# Patient Record
Sex: Male | Born: 1950
Health system: Southern US, Community
[De-identification: ages and names within clinical notes are randomized; demographics above are authoritative.]

## PROBLEM LIST (undated history)

## (undated) DIAGNOSIS — G589 Mononeuropathy, unspecified: Secondary | ICD-10-CM

## (undated) DIAGNOSIS — I288 Other diseases of pulmonary vessels: Secondary | ICD-10-CM

## (undated) DIAGNOSIS — M47816 Spondylosis without myelopathy or radiculopathy, lumbar region: Secondary | ICD-10-CM

## (undated) DIAGNOSIS — Q268 Other congenital malformations of great veins: Secondary | ICD-10-CM

## (undated) DIAGNOSIS — K644 Residual hemorrhoidal skin tags: Secondary | ICD-10-CM

## (undated) DIAGNOSIS — K5790 Diverticulosis of intestine, part unspecified, without perforation or abscess without bleeding: Secondary | ICD-10-CM

## (undated) DIAGNOSIS — H8110 Benign paroxysmal vertigo, unspecified ear: Secondary | ICD-10-CM

## (undated) DIAGNOSIS — N400 Enlarged prostate without lower urinary tract symptoms: Secondary | ICD-10-CM

## (undated) DIAGNOSIS — I480 Paroxysmal atrial fibrillation: Secondary | ICD-10-CM

## (undated) DIAGNOSIS — K4090 Unilateral inguinal hernia, without obstruction or gangrene, not specified as recurrent: Secondary | ICD-10-CM

## (undated) DIAGNOSIS — M19049 Primary osteoarthritis, unspecified hand: Secondary | ICD-10-CM

## (undated) DIAGNOSIS — F0781 Postconcussional syndrome: Secondary | ICD-10-CM

## (undated) DIAGNOSIS — E875 Hyperkalemia: Secondary | ICD-10-CM

## (undated) DIAGNOSIS — R42 Dizziness and giddiness: Secondary | ICD-10-CM

## (undated) DIAGNOSIS — R001 Bradycardia, unspecified: Secondary | ICD-10-CM

## (undated) DIAGNOSIS — S76019A Strain of muscle, fascia and tendon of unspecified hip, initial encounter: Secondary | ICD-10-CM

## (undated) DIAGNOSIS — J984 Other disorders of lung: Secondary | ICD-10-CM

## (undated) HISTORY — DX: Other diseases of pulmonary vessels: I28.8

## (undated) HISTORY — DX: Dizziness and giddiness: R42

## (undated) HISTORY — PX: ANKLE ARTHROSCOPY: SHX545

## (undated) HISTORY — DX: Unilateral inguinal hernia, without obstruction or gangrene, not specified as recurrent: K40.90

## (undated) HISTORY — DX: Residual hemorrhoidal skin tags: K64.4

## (undated) HISTORY — DX: Other congenital malformations of great veins: Q26.8

## (undated) HISTORY — DX: Hyperkalemia: E87.5

## (undated) HISTORY — DX: Bradycardia, unspecified: R00.1

## (undated) HISTORY — DX: Postconcussional syndrome: F07.81

## (undated) HISTORY — DX: Mononeuropathy, unspecified: G58.9

## (undated) HISTORY — PX: KNEE ARTHROSCOPY: SHX127

## (undated) HISTORY — DX: Primary osteoarthritis, unspecified hand: M19.049

## (undated) HISTORY — DX: Benign prostatic hyperplasia without lower urinary tract symptoms: N40.0

## (undated) HISTORY — DX: Diverticulosis of intestine, part unspecified, without perforation or abscess without bleeding: K57.90

## (undated) HISTORY — DX: Other disorders of lung: J98.4

## (undated) HISTORY — DX: Strain of muscle, fascia and tendon of unspecified hip, initial encounter: S76.019A

## (undated) HISTORY — DX: Benign paroxysmal vertigo, unspecified ear: H81.10

## (undated) HISTORY — DX: Spondylosis without myelopathy or radiculopathy, lumbar region: M47.816

---

## 1999-02-08 ENCOUNTER — Emergency Department (HOSPITAL_COMMUNITY): Admission: EM | Admit: 1999-02-08 | Discharge: 1999-02-08 | Payer: Self-pay | Admitting: Emergency Medicine

## 2001-02-22 ENCOUNTER — Encounter: Admission: RE | Admit: 2001-02-22 | Discharge: 2001-02-22 | Payer: Self-pay | Admitting: Sports Medicine

## 2001-03-12 ENCOUNTER — Encounter: Admission: RE | Admit: 2001-03-12 | Discharge: 2001-03-12 | Payer: Self-pay | Admitting: Sports Medicine

## 2001-04-16 ENCOUNTER — Encounter: Admission: RE | Admit: 2001-04-16 | Discharge: 2001-04-16 | Payer: Self-pay | Admitting: Family Medicine

## 2001-04-28 ENCOUNTER — Other Ambulatory Visit: Admission: RE | Admit: 2001-04-28 | Discharge: 2001-04-28 | Payer: Self-pay | Admitting: Sports Medicine

## 2001-04-28 ENCOUNTER — Encounter: Admission: RE | Admit: 2001-04-28 | Discharge: 2001-04-28 | Payer: Self-pay | Admitting: Family Medicine

## 2001-05-07 ENCOUNTER — Encounter: Admission: RE | Admit: 2001-05-07 | Discharge: 2001-05-07 | Payer: Self-pay | Admitting: Family Medicine

## 2001-06-01 ENCOUNTER — Encounter: Admission: RE | Admit: 2001-06-01 | Discharge: 2001-06-01 | Payer: Self-pay | Admitting: Sports Medicine

## 2001-07-09 ENCOUNTER — Encounter: Admission: RE | Admit: 2001-07-09 | Discharge: 2001-07-09 | Payer: Self-pay | Admitting: Sports Medicine

## 2002-01-13 ENCOUNTER — Encounter: Admission: RE | Admit: 2002-01-13 | Discharge: 2002-01-13 | Payer: Self-pay | Admitting: Sports Medicine

## 2002-02-08 ENCOUNTER — Encounter: Admission: RE | Admit: 2002-02-08 | Discharge: 2002-02-08 | Payer: Self-pay | Admitting: Sports Medicine

## 2002-09-27 ENCOUNTER — Encounter: Admission: RE | Admit: 2002-09-27 | Discharge: 2002-09-27 | Payer: Self-pay | Admitting: Sports Medicine

## 2002-10-13 ENCOUNTER — Encounter: Payer: Self-pay | Admitting: Sports Medicine

## 2002-10-13 ENCOUNTER — Encounter: Admission: RE | Admit: 2002-10-13 | Discharge: 2002-10-13 | Payer: Self-pay | Admitting: Sports Medicine

## 2002-11-15 ENCOUNTER — Encounter: Admission: RE | Admit: 2002-11-15 | Discharge: 2002-11-15 | Payer: Self-pay | Admitting: Family Medicine

## 2002-11-22 ENCOUNTER — Encounter: Payer: Self-pay | Admitting: Sports Medicine

## 2002-11-22 ENCOUNTER — Encounter: Admission: RE | Admit: 2002-11-22 | Discharge: 2002-11-22 | Payer: Self-pay | Admitting: Sports Medicine

## 2002-11-29 ENCOUNTER — Encounter: Admission: RE | Admit: 2002-11-29 | Discharge: 2002-11-29 | Payer: Self-pay | Admitting: Family Medicine

## 2002-12-23 ENCOUNTER — Encounter: Admission: RE | Admit: 2002-12-23 | Discharge: 2002-12-23 | Payer: Self-pay | Admitting: Sports Medicine

## 2002-12-23 ENCOUNTER — Encounter: Payer: Self-pay | Admitting: Sports Medicine

## 2002-12-28 ENCOUNTER — Encounter: Admission: RE | Admit: 2002-12-28 | Discharge: 2002-12-28 | Payer: Self-pay | Admitting: Family Medicine

## 2003-05-30 ENCOUNTER — Encounter: Payer: Self-pay | Admitting: Sports Medicine

## 2003-05-30 ENCOUNTER — Encounter: Admission: RE | Admit: 2003-05-30 | Discharge: 2003-05-30 | Payer: Self-pay | Admitting: Sports Medicine

## 2003-06-12 ENCOUNTER — Encounter: Payer: Self-pay | Admitting: Sports Medicine

## 2003-06-12 ENCOUNTER — Encounter: Admission: RE | Admit: 2003-06-12 | Discharge: 2003-06-12 | Payer: Self-pay | Admitting: Sports Medicine

## 2003-12-26 ENCOUNTER — Encounter: Admission: RE | Admit: 2003-12-26 | Discharge: 2003-12-26 | Payer: Self-pay | Admitting: Family Medicine

## 2004-01-02 ENCOUNTER — Encounter: Admission: RE | Admit: 2004-01-02 | Discharge: 2004-01-02 | Payer: Self-pay | Admitting: Sports Medicine

## 2004-06-11 ENCOUNTER — Encounter: Admission: RE | Admit: 2004-06-11 | Discharge: 2004-06-11 | Payer: Self-pay | Admitting: Family Medicine

## 2004-06-11 ENCOUNTER — Encounter: Admission: RE | Admit: 2004-06-11 | Discharge: 2004-06-11 | Payer: Self-pay | Admitting: Sports Medicine

## 2004-10-31 ENCOUNTER — Ambulatory Visit: Payer: Self-pay | Admitting: Sports Medicine

## 2004-11-29 ENCOUNTER — Ambulatory Visit: Payer: Self-pay | Admitting: Sports Medicine

## 2004-12-16 ENCOUNTER — Emergency Department (HOSPITAL_COMMUNITY): Admission: EM | Admit: 2004-12-16 | Discharge: 2004-12-16 | Payer: Self-pay | Admitting: *Deleted

## 2005-08-26 ENCOUNTER — Ambulatory Visit: Payer: Self-pay | Admitting: Sports Medicine

## 2005-10-20 ENCOUNTER — Emergency Department (HOSPITAL_COMMUNITY): Admission: EM | Admit: 2005-10-20 | Discharge: 2005-10-20 | Payer: Self-pay | Admitting: *Deleted

## 2005-10-28 ENCOUNTER — Ambulatory Visit: Payer: Self-pay | Admitting: Sports Medicine

## 2005-10-28 ENCOUNTER — Encounter: Admission: RE | Admit: 2005-10-28 | Discharge: 2005-10-28 | Payer: Self-pay | Admitting: Sports Medicine

## 2005-11-02 ENCOUNTER — Encounter: Admission: RE | Admit: 2005-11-02 | Discharge: 2005-11-02 | Payer: Self-pay | Admitting: Sports Medicine

## 2006-01-06 ENCOUNTER — Ambulatory Visit: Payer: Self-pay | Admitting: Sports Medicine

## 2006-01-22 ENCOUNTER — Ambulatory Visit: Payer: Self-pay | Admitting: Gastroenterology

## 2006-01-22 ENCOUNTER — Ambulatory Visit: Payer: Self-pay | Admitting: Cardiology

## 2006-01-29 ENCOUNTER — Ambulatory Visit: Payer: Self-pay | Admitting: Gastroenterology

## 2006-02-02 ENCOUNTER — Ambulatory Visit: Payer: Self-pay | Admitting: Gastroenterology

## 2006-10-16 ENCOUNTER — Ambulatory Visit: Payer: Self-pay | Admitting: Sports Medicine

## 2006-12-21 ENCOUNTER — Ambulatory Visit: Payer: Self-pay | Admitting: Family Medicine

## 2006-12-21 ENCOUNTER — Encounter (INDEPENDENT_AMBULATORY_CARE_PROVIDER_SITE_OTHER): Payer: Self-pay | Admitting: *Deleted

## 2006-12-21 LAB — CONVERTED CEMR LAB
AST: 23 units/L (ref 0–37)
Albumin: 4.5 g/dL (ref 3.5–5.2)
Alkaline Phosphatase: 58 units/L (ref 39–117)
Potassium: 4.5 meq/L (ref 3.5–5.3)
Sodium: 142 meq/L (ref 135–145)
Total Protein: 7.3 g/dL (ref 6.0–8.3)

## 2006-12-31 ENCOUNTER — Ambulatory Visit: Payer: Self-pay | Admitting: Sports Medicine

## 2006-12-31 DIAGNOSIS — R1011 Right upper quadrant pain: Secondary | ICD-10-CM | POA: Insufficient documentation

## 2006-12-31 DIAGNOSIS — S79919A Unspecified injury of unspecified hip, initial encounter: Secondary | ICD-10-CM | POA: Insufficient documentation

## 2006-12-31 DIAGNOSIS — S79929A Unspecified injury of unspecified thigh, initial encounter: Secondary | ICD-10-CM

## 2006-12-31 LAB — CONVERTED CEMR LAB
LDL Cholesterol: 92 mg/dL (ref 0–99)
VLDL: 8 mg/dL (ref 0–40)

## 2007-11-09 ENCOUNTER — Encounter: Admission: RE | Admit: 2007-11-09 | Discharge: 2007-11-09 | Payer: Self-pay | Admitting: Sports Medicine

## 2007-11-09 ENCOUNTER — Ambulatory Visit: Payer: Self-pay | Admitting: Sports Medicine

## 2007-11-09 DIAGNOSIS — M25559 Pain in unspecified hip: Secondary | ICD-10-CM | POA: Insufficient documentation

## 2007-12-13 ENCOUNTER — Encounter: Payer: Self-pay | Admitting: Sports Medicine

## 2007-12-17 ENCOUNTER — Encounter: Payer: Self-pay | Admitting: Sports Medicine

## 2008-01-07 ENCOUNTER — Encounter: Payer: Self-pay | Admitting: Sports Medicine

## 2008-01-26 ENCOUNTER — Encounter: Payer: Self-pay | Admitting: Sports Medicine

## 2008-03-01 ENCOUNTER — Ambulatory Visit: Payer: Self-pay | Admitting: Sports Medicine

## 2008-03-01 DIAGNOSIS — G589 Mononeuropathy, unspecified: Secondary | ICD-10-CM | POA: Insufficient documentation

## 2008-03-01 HISTORY — DX: Mononeuropathy, unspecified: G58.9

## 2008-03-21 ENCOUNTER — Telehealth: Payer: Self-pay | Admitting: Sports Medicine

## 2008-08-04 ENCOUNTER — Ambulatory Visit: Payer: Self-pay | Admitting: Sports Medicine

## 2008-08-04 DIAGNOSIS — R1031 Right lower quadrant pain: Secondary | ICD-10-CM | POA: Insufficient documentation

## 2008-08-04 DIAGNOSIS — M25519 Pain in unspecified shoulder: Secondary | ICD-10-CM | POA: Insufficient documentation

## 2008-12-18 ENCOUNTER — Encounter: Payer: Self-pay | Admitting: Sports Medicine

## 2009-10-16 ENCOUNTER — Ambulatory Visit: Payer: Self-pay | Admitting: Sports Medicine

## 2009-10-16 DIAGNOSIS — M1990 Unspecified osteoarthritis, unspecified site: Secondary | ICD-10-CM | POA: Insufficient documentation

## 2009-10-16 DIAGNOSIS — M19049 Primary osteoarthritis, unspecified hand: Secondary | ICD-10-CM

## 2009-10-16 HISTORY — DX: Primary osteoarthritis, unspecified hand: M19.049

## 2011-05-11 ENCOUNTER — Emergency Department (HOSPITAL_COMMUNITY)
Admission: EM | Admit: 2011-05-11 | Discharge: 2011-05-11 | Disposition: A | Discharge status: Home / Self Care | Payer: BC Managed Care – PPO | Attending: Emergency Medicine | Admitting: Emergency Medicine

## 2011-05-11 ENCOUNTER — Emergency Department (HOSPITAL_COMMUNITY): Payer: BC Managed Care – PPO

## 2011-05-11 DIAGNOSIS — R51 Headache: Secondary | ICD-10-CM | POA: Insufficient documentation

## 2011-05-11 DIAGNOSIS — S0990XA Unspecified injury of head, initial encounter: Secondary | ICD-10-CM | POA: Insufficient documentation

## 2011-05-11 DIAGNOSIS — IMO0002 Reserved for concepts with insufficient information to code with codable children: Secondary | ICD-10-CM | POA: Insufficient documentation

## 2011-05-11 DIAGNOSIS — R404 Transient alteration of awareness: Secondary | ICD-10-CM | POA: Insufficient documentation

## 2011-05-11 DIAGNOSIS — R110 Nausea: Secondary | ICD-10-CM | POA: Insufficient documentation

## 2011-05-13 ENCOUNTER — Telehealth: Payer: Self-pay | Admitting: *Deleted
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
## 2011-05-15 ENCOUNTER — Ambulatory Visit (INDEPENDENT_AMBULATORY_CARE_PROVIDER_SITE_OTHER): Payer: BC Managed Care – PPO | Admitting: Sports Medicine

## 2011-05-15 DIAGNOSIS — F0781 Postconcussional syndrome: Secondary | ICD-10-CM

## 2011-05-15 DIAGNOSIS — S060X9A Concussion with loss of consciousness of unspecified duration, initial encounter: Secondary | ICD-10-CM

## 2011-05-15 DIAGNOSIS — S060XAA Concussion with loss of consciousness status unknown, initial encounter: Secondary | ICD-10-CM

## 2011-05-15 HISTORY — DX: Postconcussional syndrome: F07.81
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
## 2011-05-15 NOTE — Patient Instructions (Addendum)
Do not work for the rest of the week Ok to use tylenol or aleve for headache Try meclizine for vertigo      Concussion and Brain Injury A blow or jolt to the head can disrupt the normal function of the brain. Doctors often call this type of brain injury a "concussion" or a "closed head injury." Concussions are usually not life threatening. Even so, the effects of a concussion can be serious.   CAUSES A concussion is caused by a blunt blow to the head. The blow might be direct or indirect as described below.  Direct blow (running into another player during a soccer game, being hit in a fight, or hitting your head on a hard surface).   Indirect blow (when your head moves rapidly and violently back and forth like in a car crash).  SYMPTOMS The brain is very complex. Every head injury is different. Some symptoms may appear right away. Other symptoms may not show up for days or weeks after the concussion. The signs of concussion can be hard to notice. Early on, problems may be missed by patients, family members and caregivers. You may look fine even though you are acting or feeling differently.   These symptoms are usually temporary, but may last for days, weeks, or even longer. Symptoms include:  Mild headaches that will not go away.   Having more trouble than usual with:     Remembering things.     Paying attention or concentrating.     Organizing daily tasks.     Making decisions and solving problems.     Slowness in thinking, acting, speaking or reading.     Getting lost or easily confused.   Neck pain.     Feeling tired all the time, lack of energy.     Change in sleeping pattern. (Sleeping for much longer periods of time than before. Trouble sleeping or insomnia.)     Loss of balance, feeling light-headed or dizzy.     Increased sensitivity to:    Sounds.   Lights.   Distractions.  Other symptoms might include:  Blurred vision or eyes that tire easily.    Diminished sense of taste or smell.     Ringing in the ears.     Mood changes: Feeling sad, anxious or listless.     Becoming easily irritated or angry for little or no reason.     Lack of motivation.     DIAGNOSIS Your caregiver can diagnose a concussion or mild brain injury based on your description of your injury and your symptoms. An exam is also important to check your brain and nervous system.   Your evaluation might include:  A brain scan to look for signs of injury to the brain. Even if the test shows no injury, you may still have a concussion.   Blood tests to be sure other problems are not present.   Depending on your circumstances, your caregivers might evaluate you for other injuries.  TREATMENT  People with a concussion need to be seen by a doctor. Most people with concussions are treated in an emergency department or a doctor's office. Some people must stay in the hospital overnight for further treatment.   Your caregiver will send you home with important instructions to follow. Be sure to carefully follow them.   Tell your caregiver if you are already taking any medicines (prescription, over-the-counter or natural remedies), or if you are drinking alcohol or taking illegal drugs. Also, talk  with your caregiver if you are taking blood thinners (anticoagulant drugs) or aspirin. These drugs may increase your chances of complications. All of this is important information that may affect treatment.   Only take over-the-counter or prescription medicines for pain, discomfort or fever as directed by your caregiver.  PROGNOSIS How fast people recover from brain injury varies from person to person. Although most people have a good recovery, how quickly they improve depends on many factors. These factors include how severe their concussion was, what part of the brain was injured, their age and how healthy they were before the concussion.   Because all head injuries are different,  so is recovery. Most people with mild injuries recover fully. Recovery can take time. In general, recovery is slower in older persons. Also, persons who have had a concussion in the past or have other medical problems may find that it takes longer to recover from their current injury. Anxiety and depression may also make it harder to adjust to the symptoms of brain injury. HOME CARE INSTRUCTIONS    Get plenty of sleep at night, and rest during the day. Rest helps the brain to heal.   Return to your normal activities slowly, not all at once. You may need to change your work or school activities.   Avoid activities that could lead to a second brain injury, such as contact or recreational sports, until your caregiver says it is okay. On rare occasions, receiving another concussion before a brain injury has healed can be fatal. Even after your brain injury has healed, you should protect yourself from having another concussion.   Ask your caregiver when you can drive a car, ride a bike or operate heavy equipment. Your ability to react may be slower after a brain injury.   Talk with your caregiver about when you can return to work or school. Ask your caregiver about ways to help your employer or teacher understand what has happened to you.   Take only those drugs that your caregiver has approved.   Do not drink alcohol until your caregiver says you are well enough to do so. Alcohol and certain other drugs may slow your recovery and can put you at risk of further injury.   If it is harder than usual to remember things, write them down.   If you are easily distracted, try to do one thing at a time. For example, do not try to watch TV while fixing dinner.   Talk with family members or close friends when making important decisions.  Avoid injuries by using:  Seatbelts when riding in a car   Alcohol only in moderation.   A helmet when biking, skiing, skateboarding, roller skating, or doing similar  activities.  SEEK MEDICAL CARE IF: A head injury can cause lingering symptoms. You should seek medical care if you have any of the following symptoms for more than 3 weeks after your injury or are planning to return to sports:  Chronic headaches.   Dizziness or balance problems.     Nausea.    Vision problems.     Increased sensitivity to noise or light.     Depression or mood swings.   Anxiety or irritability.     Memory problems.     Difficulty concentrating or paying attention.     Sleep problems.     Feeling tired all the time.     SEEK IMMEDIATE MEDICAL CARE IF: You have had a blow or jolt to the  head and you (or your family or friends) notice:  Headaches that get worse.   Weakness (even if only in one hand or one leg or one part of the face), numbness or decreased coordination.     Repeated vomiting.     Increased sleepiness or passing out.     One pupil (the black part in the middle of the eye) is larger than the other.   Convulsions or seizures.     Slurred speech.     Confused, restless, or agitated behavior.     Older adults with a brain injury may have a higher risk of serious complications such as a blood clot on the brain. Headaches that get worse or an increase in confusion are signs of this complication. If these signs occur, see a caregiver right away. FOR MORE INFORMATION Several groups help people with brain injury and their families. They provide information and put people in touch with local resources. These include support groups, rehabilitation services, and a variety of health care professionals. Among these groups, the Brain Injury Association (BIA, www.biausa.org) has a Secretary/administrator that gathers scientific and educational information and works on a national level to help people with brain injury.   MAKE SURE YOU:    Understand these instructions.   Will watch your condition.   Will get help right away if you are not doing well or get  worse.  Document Released: 01/03/2004 Document Re-Released: 04/02/2010 [facility] Patient Information 2011 [place], [place].

## 2011-05-15 NOTE — Assessment & Plan Note (Signed)
I think he is showing significant early postconcussive symptoms.  I think he needs strict cognitive rest for the next week and to stop his work activities as well as avoiding any extra stimulation.  We gave him a handout on instructions and details symptoms that would be concerning as well as other things to consider.  I will recheck him in one week but if he is not making significant progress we'll consider additional testing or possibly a neurological referral. Also consider the use of medications should he not be responding.

## 2011-05-19 ENCOUNTER — Telehealth: Payer: Self-pay | Admitting: *Deleted

## 2011-05-19 NOTE — Telephone Encounter (Signed)
Pt's wife called stating his vertigo, and dizziness had worsened since his appt thurs.  States pt has been following Dr. Darrick Penna suggestion that he should rest as much as possible- pt has been in bed most of the time for the past 4 days.  Pt's wife wonders if Dr. Darrick Penna wants to have pt go ahead and get MRI.   Per dr. Darrick Penna- scheduled pt to come in 05/20/11 at 1:30pm for further evaluation.

## 2011-05-20 ENCOUNTER — Encounter: Payer: Self-pay | Admitting: Sports Medicine

## 2011-05-20 ENCOUNTER — Ambulatory Visit (INDEPENDENT_AMBULATORY_CARE_PROVIDER_SITE_OTHER): Payer: BC Managed Care – PPO | Admitting: Sports Medicine

## 2011-05-20 VITALS — BP 120/73 | HR 54

## 2011-05-20 DIAGNOSIS — F0781 Postconcussional syndrome: Secondary | ICD-10-CM

## 2011-05-20 NOTE — Patient Instructions (Signed)
Great to see you! Walking balance around the living room. Lying to sitting to standing 3x, 2 times a day. Let your body be the guide by avoiding provocative activities. Come back to see [place] in 1 week. Drs. Fields and [employer]

## 2011-05-20 NOTE — Progress Notes (Signed)
  Subjective:    Patient ID: William Underwood, male    DOB: Oct 27, 1951, 60 y.o.   MRN: [id number]  HPI William Underwood comes in for followup of his concussion. The injury occurred approximately 9 days ago, however he went back to work and did not limit his physical activity for approximately 3 days after that. He did note severe worsening of his symptoms while at work, and while doing cognitive activities, in that he developed headaches vertigo and dizziness. Since then he was seen recently here and placed on full cognitive and physical rest. He notes that overall he is significantly better today, with resolved headache, dizziness, vertigo. He still has significant problems with balance. No nausea, vomiting, weakness, numbness.   Review of Systems    negative except as noted above in the history of present illness. Objective:   Physical Exam     general: Well-developed well-nourished Caucasian male. His affect has improved significantly he seen laughing and joking appropriately today. Serial sevens: Successful. Ocular divergence: At 4 cm. Noted some nystagmus with testing of extraocular muscles. Patient failed balance testing with normal stance, tandem, one lag, with eyes open and closed. He had at least 2 errors in each test.  Assessment & Plan:

## 2011-05-20 NOTE — Assessment & Plan Note (Addendum)
He will continue cognitive and physical rest. We would also like him to start some balance retraining. He is significantly improved cognitively. He is significantly improved emotionally. And his vertigo has significantly improved. We will see him back in one week.

## 2011-05-22 ENCOUNTER — Ambulatory Visit: Payer: BC Managed Care – PPO | Admitting: Sports Medicine

## 2011-05-26 ENCOUNTER — Ambulatory Visit (INDEPENDENT_AMBULATORY_CARE_PROVIDER_SITE_OTHER): Payer: BC Managed Care – PPO | Admitting: Sports Medicine

## 2011-05-26 ENCOUNTER — Encounter: Payer: Self-pay | Admitting: Sports Medicine

## 2011-05-26 VITALS — BP 123/73 | HR 61

## 2011-05-26 DIAGNOSIS — F0781 Postconcussional syndrome: Secondary | ICD-10-CM

## 2011-05-26 NOTE — Patient Instructions (Signed)
Go Back to work half days for one week Full the next week Monitor symptoms of concussion to be sure this is OK  Exercise Start off with just walking - 15 to 20 minutes - two or three times daily as desired Stationary bike - up to 30 minutes - start easy level don't exceed moderate OK to do easy dumbbell exercises  Recheck in two weeks and we will progress as this allows

## 2011-05-26 NOTE — Progress Notes (Signed)
  Subjective:    Patient ID: William Underwood, male    DOB: [birth year], 60 y.o.   MRN: [id number]  HPI Follow up of concussion Mood has normalized per wife Only periodic balance change and none today No visual changes No headaches unless vertigo or other sx persists/ blocked with tylenol  Past 3 to 4 days has been back on computer/ watching TV with no symptoms  BPV may still be part - as lying and quick head turn gives quick vertigo now, but same as what he had before concussion  Biggest sx is tinnitus with RT ear worse than left/ this had happened before concussion but now is much worse   Review of Systems     Objective:   Physical Exam  NAD, alert and interactive with normal cognition Neuro exam shows normal evaluation to standard testing Balance testing 1 foot testing normal Heel and toe walk normal Tandem walk with sligtht sway  Cerebellar testing normal  Dynamic  Equilibrium Gait after 3 rotations with eyes  Open remains normal Same test with rotation with eyes closed remains normal without sway      Assessment & Plan:   No problem-specific assessment & plan notes found for this encounter.

## 2011-05-26 NOTE — Assessment & Plan Note (Signed)
Excellent progress  RT work on schedule noted  Increase activity on schedule  Step back if any issues  Return in 2 weeks and at that time more aggressive provocative testing  For sports activity

## 2011-05-27 ENCOUNTER — Ambulatory Visit: Payer: BC Managed Care – PPO | Admitting: Sports Medicine

## 2011-05-30 ENCOUNTER — Telehealth: Payer: Self-pay | Admitting: Family Medicine

## 2011-05-30 ENCOUNTER — Ambulatory Visit (INDEPENDENT_AMBULATORY_CARE_PROVIDER_SITE_OTHER): Payer: BC Managed Care – PPO | Admitting: Family Medicine

## 2011-05-30 DIAGNOSIS — F0781 Postconcussional syndrome: Secondary | ICD-10-CM

## 2011-05-30 MED ORDER — DIAZEPAM 5 MG PO TABS: ORAL_TABLET | ORAL | Status: DC

## 2011-05-30 NOTE — Telephone Encounter (Signed)
Valium is not yet at [facility] in [place] # is [phone].  He saw Dr. Jennette Kettle at [facility] this morning and she was going to call in that prescription to treat his Vertigo.

## 2011-05-30 NOTE — Telephone Encounter (Signed)
William Underwood Can u call this in [facility]! Denny Levy
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
## 2011-05-30 NOTE — Telephone Encounter (Signed)
I have done this and notified pt

## 2011-05-30 NOTE — Telephone Encounter (Signed)
Spoke w wife This is taken care of

## 2011-06-09 ENCOUNTER — Encounter: Payer: Self-pay | Admitting: Sports Medicine

## 2011-06-09 ENCOUNTER — Ambulatory Visit (INDEPENDENT_AMBULATORY_CARE_PROVIDER_SITE_OTHER): Payer: BC Managed Care – PPO | Admitting: Sports Medicine

## 2011-06-09 VITALS — BP 120/79 | HR 55

## 2011-06-09 DIAGNOSIS — F0781 Postconcussional syndrome: Secondary | ICD-10-CM

## 2011-06-09 NOTE — Progress Notes (Signed)
  Subjective:    Patient ID: William Underwood, male    DOB: 1951-01-01, 59 y.o.   MRN: [id number]  HPI  Pt presents to clinic for f/u of concussion He continues to have vertigo symptoms and feels unsteady on feet with walking Vertigo worse in the mornings Has dizziness with turning head Headaches, fogginess, and nausea has subsided  Has been riding stationary bike for 30-45 minutes daily which does not worsen symptoms  Ran twice this weekend a total of 9 miles which caused vertigo to improve slightly      Review of Systems     Objective:   Physical Exam NAD Alert and oriented in no problems with routine cognition  Slight nystagmus on gaze to rt or lt No vertical nystagmus Leans head to rt and lt without nystagmus, but felt vertigo Testing C5-T1 strong Neurological testing- cranial nerves intact Cerebellar testing- finger to nose good, finger to finger on 2nd try Toe and heel walking better when going faster Unable to do 1 foot cone touch on rt or lt foot  He has an unstable gait initially after we do a closed rotation test followed by rapid walking        Assessment & Plan:

## 2011-06-09 NOTE — Assessment & Plan Note (Signed)
He still has postconcussion symptoms but they're mostly limited to problems with unsteadiness and balance  He now is able to do all work activities and even physical activities without worsening his symptoms   Keep triggers for his unsteadiness are often positional so suspect there is still some component of his benign positional vertigo   We will give him some diazepam to half milligrams to use at night as his symptoms worsen at that time. He'll also start some balance and provocative activity to see if that can speed the resolution of his symptoms. Recheck in 3 wks

## 2011-06-09 NOTE — Patient Instructions (Signed)
Ok to do physical activity that does not make symptoms worsen- stationary bike and running to tolerance level  Start taking 1/2 of a valium at bedtime  Go through easy motion exercises with head and neck 2-3 times per day- not to the point that it brings out symptoms  Once at night do the maneuver where you lie down then sit up and turn to the left, then lie back down then sit up and turn to right- repeat this 2-3 times  Do cone touches on 1 foot while holding onto a table  Do 1 foot balance standing near wall to use for balance  Return for follow up in 3 weeks

## 2011-07-01 ENCOUNTER — Encounter: Payer: Self-pay | Admitting: Sports Medicine

## 2011-07-01 ENCOUNTER — Ambulatory Visit (INDEPENDENT_AMBULATORY_CARE_PROVIDER_SITE_OTHER): Payer: BC Managed Care – PPO | Admitting: Sports Medicine

## 2011-07-01 VITALS — BP 117/71 | HR 51

## 2011-07-01 DIAGNOSIS — H8110 Benign paroxysmal vertigo, unspecified ear: Secondary | ICD-10-CM | POA: Insufficient documentation

## 2011-07-01 DIAGNOSIS — F0781 Postconcussional syndrome: Secondary | ICD-10-CM

## 2011-07-01 HISTORY — DX: Benign paroxysmal vertigo, unspecified ear: H81.10

## 2011-07-01 NOTE — Assessment & Plan Note (Signed)
Improving, still with signs of concussive syndrome.  Instructed to continue to do one leg cone touches.  Will see back in 6 weeks.

## 2011-07-01 NOTE — Assessment & Plan Note (Signed)
Difficult to differentiate which symptoms may be related to BPPV vs. Post-concussive syndrome.  Does have history of BPPV and has vertigo symptoms with head rotation.  Epley maneuver done today to see if this helps improve vertigo symptoms.
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
## 2011-08-12 ENCOUNTER — Ambulatory Visit (INDEPENDENT_AMBULATORY_CARE_PROVIDER_SITE_OTHER): Payer: BC Managed Care – PPO | Admitting: Sports Medicine

## 2011-08-12 VITALS — BP 120/70

## 2011-08-12 DIAGNOSIS — H8110 Benign paroxysmal vertigo, unspecified ear: Secondary | ICD-10-CM

## 2011-08-12 DIAGNOSIS — F0781 Postconcussional syndrome: Secondary | ICD-10-CM

## 2011-08-12 MED ORDER — DIAZEPAM 5 MG PO TABS: ORAL_TABLET | ORAL | Status: DC

## 2011-08-12 NOTE — Patient Instructions (Signed)
Home Exercise Program:  Counts of 10 x5 laying flat on floor, relax traps, lay head flat Counts of 5 x3  light resistance of neck in each range of motion 1 foot balance with door balance, eyes closed 5x Tandem walking with balance on wall  Continue low dose valium nightly x 1 month
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
## 2011-08-15 ENCOUNTER — Ambulatory Visit: Payer: BC Managed Care – PPO | Attending: Sports Medicine | Admitting: Physical Therapy

## 2011-08-15 DIAGNOSIS — R269 Unspecified abnormalities of gait and mobility: Secondary | ICD-10-CM | POA: Insufficient documentation

## 2011-08-15 DIAGNOSIS — R42 Dizziness and giddiness: Secondary | ICD-10-CM | POA: Insufficient documentation

## 2011-08-15 DIAGNOSIS — IMO0001 Reserved for inherently not codable concepts without codable children: Secondary | ICD-10-CM | POA: Insufficient documentation

## 2011-08-25 ENCOUNTER — Ambulatory Visit: Payer: BC Managed Care – PPO | Admitting: Rehabilitative and Restorative Service Providers"

## 2011-08-27 ENCOUNTER — Ambulatory Visit: Payer: BC Managed Care – PPO | Admitting: Rehabilitative and Restorative Service Providers"

## 2011-09-01 ENCOUNTER — Ambulatory Visit: Payer: BC Managed Care – PPO | Attending: Sports Medicine | Admitting: Physical Therapy

## 2011-09-01 DIAGNOSIS — IMO0001 Reserved for inherently not codable concepts without codable children: Secondary | ICD-10-CM | POA: Insufficient documentation

## 2011-09-01 DIAGNOSIS — R269 Unspecified abnormalities of gait and mobility: Secondary | ICD-10-CM | POA: Insufficient documentation

## 2011-09-01 DIAGNOSIS — R42 Dizziness and giddiness: Secondary | ICD-10-CM | POA: Insufficient documentation

## 2011-09-03 ENCOUNTER — Other Ambulatory Visit: Payer: Self-pay | Admitting: *Deleted

## 2011-09-03 ENCOUNTER — Ambulatory Visit: Payer: BC Managed Care – PPO | Admitting: Rehabilitative and Restorative Service Providers"

## 2011-09-03 MED ORDER — AMITRIPTYLINE HCL 25 MG PO TABS: 25.0000 mg | ORAL_TABLET | Freq: Every day | ORAL | Status: DC

## 2011-09-10 ENCOUNTER — Ambulatory Visit: Payer: BC Managed Care – PPO | Admitting: Rehabilitative and Restorative Service Providers"

## 2011-09-12 ENCOUNTER — Encounter: Payer: Self-pay | Admitting: Sports Medicine

## 2011-09-12 ENCOUNTER — Telehealth: Payer: Self-pay | Admitting: *Deleted

## 2011-09-12 NOTE — Telephone Encounter (Signed)
Please send letter and office notes as well as note from neuro PT along with fax. Find out what we need to do to get him an appointment with Dr Joelene Millin.      Pt scheduled for appt with Dr. Joelene Millin on 10/09/11 @ 11:15 am. Office # is [phone] appt line- [phone] opt 1  Fax [phone]  Faxed office notes and notes from Neuro PT. Patient is scheduled to have hearing test done by Geralyn Corwin @ the hearing [facility] 09/29/11- will fax this to Dr. Chilton Si office after completion.

## 2011-09-15 ENCOUNTER — Ambulatory Visit: Payer: BC Managed Care – PPO | Admitting: Rehabilitative and Restorative Service Providers"

## 2011-09-17 ENCOUNTER — Encounter: Payer: BC Managed Care – PPO | Admitting: Rehabilitative and Restorative Service Providers"

## 2011-09-22 ENCOUNTER — Encounter: Payer: BC Managed Care – PPO | Admitting: Rehabilitative and Restorative Service Providers"

## 2011-09-24 ENCOUNTER — Ambulatory Visit: Payer: BC Managed Care – PPO | Admitting: Rehabilitative and Restorative Service Providers"

## 2011-09-29 ENCOUNTER — Encounter: Payer: BC Managed Care – PPO | Admitting: Rehabilitative and Restorative Service Providers"

## 2011-10-01 ENCOUNTER — Ambulatory Visit: Payer: BC Managed Care – PPO | Attending: Sports Medicine | Admitting: Rehabilitative and Restorative Service Providers"

## 2011-10-01 DIAGNOSIS — R269 Unspecified abnormalities of gait and mobility: Secondary | ICD-10-CM | POA: Insufficient documentation

## 2011-10-01 DIAGNOSIS — IMO0001 Reserved for inherently not codable concepts without codable children: Secondary | ICD-10-CM | POA: Insufficient documentation

## 2011-10-01 DIAGNOSIS — R42 Dizziness and giddiness: Secondary | ICD-10-CM | POA: Insufficient documentation

## 2011-10-07 ENCOUNTER — Ambulatory Visit: Payer: BC Managed Care – PPO | Admitting: Rehabilitative and Restorative Service Providers"

## 2011-10-10 ENCOUNTER — Encounter: Payer: BC Managed Care – PPO | Admitting: Rehabilitative and Restorative Service Providers"

## 2011-10-14 ENCOUNTER — Encounter: Payer: BC Managed Care – PPO | Admitting: Rehabilitative and Restorative Service Providers"

## 2011-10-17 ENCOUNTER — Encounter: Payer: BC Managed Care – PPO | Admitting: Rehabilitative and Restorative Service Providers"

## 2011-12-15 ENCOUNTER — Other Ambulatory Visit: Payer: Self-pay | Admitting: *Deleted

## 2011-12-15 MED ORDER — DIAZEPAM 5 MG PO TABS: 5.0000 mg | ORAL_TABLET | Freq: Every day | ORAL | Status: DC

## 2012-03-30 ENCOUNTER — Ambulatory Visit (INDEPENDENT_AMBULATORY_CARE_PROVIDER_SITE_OTHER): Payer: BC Managed Care – PPO | Admitting: Sports Medicine

## 2012-03-30 VITALS — BP 134/79

## 2012-03-30 DIAGNOSIS — F0781 Postconcussional syndrome: Secondary | ICD-10-CM

## 2012-03-30 MED ORDER — CLONAZEPAM 0.5 MG PO TABS: 0.5000 mg | ORAL_TABLET | ORAL | Status: DC

## 2012-03-30 NOTE — Patient Instructions (Signed)
Vit C 500 daily. Fish oil 1 gram daily Klonopin as directed.  Return in 1 month for recheck

## 2012-03-30 NOTE — Progress Notes (Signed)
  Subjective:    Patient ID: William Underwood, male    DOB: [birth year], 61 y.o.   MRN: [id number]  HPI Post concussion syndrome f/up: Pt states that he has improved since last appointment but does have some persistent symptoms. In particular he continues to have some symptoms of dysequilibrium.  He also has had some anxiety in stressful work situations. Has daily fatigue.  Still having sleep disturbance- unable to fall asleep easily, and waking up after a few hours.    Valium did help with this but is out of this prescription.  Both the dysequilibrium and anxiety seem to worsen with increase in work stress.  Improve when stress is low.   Has gone back to running long distance- recently did a marathon.  Vertigo symptoms he was experiencing are under good control after neuro rehab with Margretta Ditty.   + problems with concentration.  + decrease in energy.  Good appetite.  No symptoms of depression.     Review of Systems As per above.     Objective:   Physical Exam  Constitutional: He appears well-developed and well-nourished.  HENT:  Head: Normocephalic and atraumatic.  Eyes: Pupils are equal, round, and reactive to light.  Cardiovascular: Normal rate.   Pulmonary/Chest: Effort normal. No respiratory distress.  Neurological: He is alert. He displays normal reflexes. No cranial nerve deficit. He exhibits normal muscle tone.       + march dysequilibrium test          Assessment & Plan:

## 2012-03-30 NOTE — Assessment & Plan Note (Signed)
Pt to be referred back to neuro rehab for further work on symptoms of dysequilibrium. Will start pt on vit C 500 and Fish Oil 1gram- since some evidence supports that these can help with neurological healing. Will start pt on klonopin 0.5mg  at bedtime and 0.25mg  in the am and at noon. -this to help with sleep disturbance and with increased anxiety.   Pt to return in 1 month for recheck.

## 2012-04-02 ENCOUNTER — Ambulatory Visit: Payer: BC Managed Care – PPO | Attending: Family Medicine | Admitting: Rehabilitative and Restorative Service Providers"

## 2012-04-02 DIAGNOSIS — IMO0001 Reserved for inherently not codable concepts without codable children: Secondary | ICD-10-CM | POA: Insufficient documentation

## 2012-04-02 DIAGNOSIS — H8110 Benign paroxysmal vertigo, unspecified ear: Secondary | ICD-10-CM | POA: Insufficient documentation

## 2012-04-02 DIAGNOSIS — R269 Unspecified abnormalities of gait and mobility: Secondary | ICD-10-CM | POA: Insufficient documentation

## 2012-04-06 ENCOUNTER — Ambulatory Visit: Payer: BC Managed Care – PPO | Admitting: Rehabilitative and Restorative Service Providers"

## 2012-04-13 ENCOUNTER — Ambulatory Visit: Payer: BC Managed Care – PPO | Admitting: Rehabilitative and Restorative Service Providers"

## 2012-04-14 ENCOUNTER — Ambulatory Visit: Payer: BC Managed Care – PPO | Admitting: Rehabilitative and Restorative Service Providers"

## 2012-04-20 ENCOUNTER — Encounter: Payer: BC Managed Care – PPO | Admitting: Rehabilitative and Restorative Service Providers"

## 2012-04-22 ENCOUNTER — Ambulatory Visit: Payer: BC Managed Care – PPO | Admitting: Rehabilitative and Restorative Service Providers"

## 2012-04-26 ENCOUNTER — Ambulatory Visit: Payer: BC Managed Care – PPO | Attending: Family Medicine | Admitting: Rehabilitative and Restorative Service Providers"

## 2012-04-26 DIAGNOSIS — IMO0001 Reserved for inherently not codable concepts without codable children: Secondary | ICD-10-CM | POA: Insufficient documentation

## 2012-04-26 DIAGNOSIS — H8110 Benign paroxysmal vertigo, unspecified ear: Secondary | ICD-10-CM | POA: Insufficient documentation

## 2012-04-26 DIAGNOSIS — R269 Unspecified abnormalities of gait and mobility: Secondary | ICD-10-CM | POA: Insufficient documentation

## 2012-04-28 ENCOUNTER — Encounter: Payer: BC Managed Care – PPO | Admitting: Rehabilitative and Restorative Service Providers"

## 2012-04-28 ENCOUNTER — Ambulatory Visit: Payer: BC Managed Care – PPO | Admitting: Sports Medicine

## 2012-05-03 ENCOUNTER — Encounter: Payer: BC Managed Care – PPO | Admitting: Rehabilitative and Restorative Service Providers"

## 2012-05-07 ENCOUNTER — Encounter: Payer: BC Managed Care – PPO | Admitting: Rehabilitative and Restorative Service Providers"

## 2012-05-11 ENCOUNTER — Encounter: Payer: BC Managed Care – PPO | Admitting: Rehabilitative and Restorative Service Providers"

## 2012-05-14 ENCOUNTER — Ambulatory Visit: Payer: BC Managed Care – PPO | Admitting: Rehabilitative and Restorative Service Providers"

## 2012-05-18 ENCOUNTER — Ambulatory Visit: Payer: BC Managed Care – PPO | Admitting: Rehabilitative and Restorative Service Providers"

## 2012-05-19 ENCOUNTER — Ambulatory Visit (INDEPENDENT_AMBULATORY_CARE_PROVIDER_SITE_OTHER): Payer: BC Managed Care – PPO | Admitting: Sports Medicine

## 2012-05-19 VITALS — BP 118/67

## 2012-05-19 DIAGNOSIS — M25579 Pain in unspecified ankle and joints of unspecified foot: Secondary | ICD-10-CM | POA: Insufficient documentation

## 2012-05-19 DIAGNOSIS — S93409A Sprain of unspecified ligament of unspecified ankle, initial encounter: Secondary | ICD-10-CM

## 2012-05-19 DIAGNOSIS — S93402A Sprain of unspecified ligament of left ankle, initial encounter: Secondary | ICD-10-CM

## 2012-05-19 NOTE — Progress Notes (Signed)
  Subjective:    Patient ID: William Underwood, male    DOB: 29-Jul-1951, 61 y.o.   MRN: [id number]  HPI This morning running He had a fall hurting left ankle Immediate swelling  Concussion July 2012 Still has balance issues Stress brings out concussion sxs BPV long term was made worse by concussion Now with neuro rehab and this has settled down Still unsteady on walking gait - narrow based  Klonopin helped him restore sleep pattern Now off medications  Review of Systems     Objective:   Physical Exam   NAD - walks with antalgic limp  Left ankle Marked swelling over lateral malleolus Negative [place] rules Drawer is 1+ positive Neg kleiger and squeeze but tight  MSK [place] Small fleck off talus Partial tear of ATF All other bony structures are normal Soft tissue swelling over malleolus        Assessment & Plan:

## 2012-05-19 NOTE — Assessment & Plan Note (Signed)
Icing and NSAIDs  Use air cast for support for 2 to 3 weeks

## 2012-05-19 NOTE — Assessment & Plan Note (Signed)
Std ankle rehab Advance after 1 week to balance  Balance issues from concussion may make ankles unstable  We probably need to use Xbraces for both ankles for him to run in future as he has proprioception deficits post concussion  Reck 3 wks

## 2012-05-20 DIAGNOSIS — N419 Inflammatory disease of prostate, unspecified: Secondary | ICD-10-CM | POA: Insufficient documentation

## 2012-05-20 DIAGNOSIS — E875 Hyperkalemia: Secondary | ICD-10-CM

## 2012-05-20 HISTORY — DX: Hyperkalemia: E87.5

## 2012-05-24 ENCOUNTER — Encounter: Payer: BC Managed Care – PPO | Admitting: Rehabilitative and Restorative Service Providers"

## 2012-05-31 ENCOUNTER — Encounter: Payer: BC Managed Care – PPO | Admitting: Rehabilitative and Restorative Service Providers"

## 2012-06-07 ENCOUNTER — Ambulatory Visit: Payer: BC Managed Care – PPO | Attending: Family Medicine | Admitting: Rehabilitative and Restorative Service Providers"

## 2012-06-07 DIAGNOSIS — IMO0001 Reserved for inherently not codable concepts without codable children: Secondary | ICD-10-CM | POA: Insufficient documentation

## 2012-06-07 DIAGNOSIS — R269 Unspecified abnormalities of gait and mobility: Secondary | ICD-10-CM | POA: Insufficient documentation

## 2012-06-07 DIAGNOSIS — H8110 Benign paroxysmal vertigo, unspecified ear: Secondary | ICD-10-CM | POA: Insufficient documentation

## 2012-06-16 ENCOUNTER — Ambulatory Visit (INDEPENDENT_AMBULATORY_CARE_PROVIDER_SITE_OTHER): Payer: BC Managed Care – PPO | Admitting: Sports Medicine

## 2012-06-16 VITALS — BP 130/74 | Ht 67.0 in | Wt 150.0 lb

## 2012-06-16 DIAGNOSIS — S93409A Sprain of unspecified ligament of unspecified ankle, initial encounter: Secondary | ICD-10-CM

## 2012-06-16 DIAGNOSIS — S93402A Sprain of unspecified ligament of left ankle, initial encounter: Secondary | ICD-10-CM

## 2012-06-16 NOTE — Progress Notes (Signed)
  Subjective:    Patient ID: William Underwood, male    DOB: Aug 21, 1951, 61 y.o.   MRN: [id number]  HPI  Pt presents to clinic for f/u of lt ankle sprain which is still causing pain and swelling. Has tried to run, but only able to go 1/2 mile without significant pain and swelling. Most swelling is at lateral ankle.  Riding stationary bike without problems.  Some medial pain after activity   Continues with PT for balance s/p concussion - on a break currently 2/2 ankle sprain. Still falls if dark or eyes closed   Review of Systems     Objective:   Physical Exam   Lt ankle exam:  Pain in lateral ankle with inversion Mild laxity of ATF on lt only Tenderness at talar dome Moderate subluxation of talus but this is present bilat Mild swelling over lateral malleolus most anterior than posterior Cuboid, navicular, and base of 5th MT normal  Unable to stand with one foot balance for more than a few seconds Unable to tandum walk Horizontal "wobble" on walking at ankles  One foot balance improved with bodyhelix full ankle       Assessment & Plan:

## 2012-06-16 NOTE — Patient Instructions (Addendum)
Wear bodyhelix ankle brace as much as possible  Work on plantar and dorsiflexion and eversion strength (practice pointing toes up again something for tension, then pointing down again ball, then pointing out against wall)  One foot balance standing near something to balance with - when this gets easy try with eyes closed  Please follow up in 3 weeks for an ultrasound  Thank you for seeing [place] today!

## 2012-06-16 NOTE — Assessment & Plan Note (Addendum)
This is improved  Start using compression sleeve as much as possible to assist w proprioception  Balance exercise frequently Gentle strength increase with isometrics  Reck 3 wks and if better return to neuro-rehab

## 2012-06-22 ENCOUNTER — Ambulatory Visit: Payer: BC Managed Care – PPO | Admitting: Internal Medicine

## 2012-07-06 ENCOUNTER — Ambulatory Visit (INDEPENDENT_AMBULATORY_CARE_PROVIDER_SITE_OTHER): Payer: BC Managed Care – PPO | Admitting: Sports Medicine

## 2012-07-06 ENCOUNTER — Encounter: Payer: Self-pay | Admitting: Sports Medicine

## 2012-07-06 VITALS — BP 119/78 | HR 56 | Ht 67.0 in | Wt 150.0 lb

## 2012-07-06 DIAGNOSIS — M25579 Pain in unspecified ankle and joints of unspecified foot: Secondary | ICD-10-CM

## 2012-07-06 DIAGNOSIS — S93402A Sprain of unspecified ligament of left ankle, initial encounter: Secondary | ICD-10-CM

## 2012-07-06 DIAGNOSIS — S93409A Sprain of unspecified ligament of unspecified ankle, initial encounter: Secondary | ICD-10-CM

## 2012-07-06 NOTE — Assessment & Plan Note (Signed)
Anterior talofibular Ligament appears healed but somewhat stretched on ultrasound  Bony fragments visualized last visit are still present with associated soft tissue swelling

## 2012-07-06 NOTE — Progress Notes (Signed)
  Subjective:    Patient ID: William Underwood, male    DOB: 05-Aug-1951, 61 y.o.   MRN: [id number]  HPI 61 y/o male here for f/u of left ankle pain. Initial injury occurred while running on 05/19/12. Patient had lateral ankle pain and swelling.   MSK U/S at that time of the lateral ankle showed small fragment off the talus and partial tear of ATF. Patient started on ice, compression sleeve, and routine ankle rehab exercises.  Patient notes no improvement since that time. He thinks he might even be worse. He cannot even run 100 yds without onset of severe ankle pain. He continues to wear the compression sleeve and do rehab exercises. However, he continues to have swelling of the lateral ankle and pain all around the lateral malleolus.   Review of Systems     Objective:   Physical Exam Gen: Alert, NAD, well appearing Left Ankle: - mild swelling around lateral malleolus. - TTP over ATF and CF ligaments.  - No TTP over malleolus, 5th metatarsal, cuboid - Mild laxity on anterior drawer - Pain on dorsiflexion and eversion - 5/5 strength on eversion, inversion, dorsiflexion, plantarflexion  MSK U/S of Left Ankle - Bony fragment off lateral talus, appears increased in size compared to initial scan - 2 fragments noted off lateral malleolus - Mild edema within ankle joint - No tear within ATF, appears healed.    Assessment & Plan:  #1. Lateral Ankle Pain. Patient is now 6 weeks out from injury and should have had significant improvement by now. However, his symptoms have not responded as anticipated to therapy. Repeat ultrasound concerning for bony fragments that may be impinging during ankle movement.  - Refer to Dr. Yisroel Ramming for additional evaluation and xray - Continue compression, icing, and ankle rehab exercises - Activity as limited by pain

## 2012-07-06 NOTE — Patient Instructions (Addendum)
Left Ankle Pain.   - Xray at Dr. Sara Chu - Continue compression sleeve - Continue ice for swelling - Continue ankle exercises  -Dr. Eloise Harman appt is Monday, September 16th at 330p; Arrive at [facility]. Phone number: [phone]

## 2012-07-06 NOTE — Assessment & Plan Note (Signed)
We can visualize small avulsion fractures on his ultrasound examination  My concern is that we might be missing a fragment inside the joint or at the talar dome  He is sent for an opinion by Dr. Yisroel Ramming

## 2012-07-08 ENCOUNTER — Ambulatory Visit: Payer: BC Managed Care – PPO | Admitting: Sports Medicine

## 2012-11-17 ENCOUNTER — Other Ambulatory Visit: Payer: Self-pay | Admitting: *Deleted

## 2012-11-17 DIAGNOSIS — F0781 Postconcussional syndrome: Secondary | ICD-10-CM

## 2012-11-17 DIAGNOSIS — R42 Dizziness and giddiness: Secondary | ICD-10-CM

## 2012-11-22 ENCOUNTER — Ambulatory Visit: Payer: BC Managed Care – PPO | Attending: Family Medicine | Admitting: Rehabilitative and Restorative Service Providers"

## 2012-11-22 ENCOUNTER — Ambulatory Visit: Payer: BC Managed Care – PPO | Admitting: Rehabilitative and Restorative Service Providers"

## 2012-11-22 ENCOUNTER — Other Ambulatory Visit: Payer: Self-pay | Admitting: *Deleted

## 2012-11-22 DIAGNOSIS — H8110 Benign paroxysmal vertigo, unspecified ear: Secondary | ICD-10-CM | POA: Insufficient documentation

## 2012-11-22 DIAGNOSIS — R269 Unspecified abnormalities of gait and mobility: Secondary | ICD-10-CM | POA: Insufficient documentation

## 2012-11-22 DIAGNOSIS — IMO0001 Reserved for inherently not codable concepts without codable children: Secondary | ICD-10-CM | POA: Insufficient documentation

## 2012-11-22 MED ORDER — DIAZEPAM 5 MG PO TABS: 5.0000 mg | ORAL_TABLET | Freq: Every evening | ORAL | Status: DC | PRN

## 2012-11-22 NOTE — Progress Notes (Signed)
Refilled per Dr. Darrick Penna  Pt's wife states his vertigo is flaring up again.

## 2012-11-23 ENCOUNTER — Encounter: Payer: BC Managed Care – PPO | Admitting: Rehabilitative and Restorative Service Providers"

## 2012-11-23 ENCOUNTER — Ambulatory Visit: Payer: BC Managed Care – PPO | Admitting: Rehabilitative and Restorative Service Providers"

## 2012-11-24 ENCOUNTER — Ambulatory Visit: Payer: BC Managed Care – PPO | Admitting: Physical Therapy

## 2012-11-25 ENCOUNTER — Ambulatory Visit: Payer: BC Managed Care – PPO | Admitting: Rehabilitative and Restorative Service Providers"

## 2012-11-30 ENCOUNTER — Ambulatory Visit: Payer: BC Managed Care – PPO | Admitting: Rehabilitative and Restorative Service Providers"

## 2012-12-02 ENCOUNTER — Encounter: Payer: BC Managed Care – PPO | Admitting: Physical Therapy

## 2012-12-08 ENCOUNTER — Encounter: Payer: BC Managed Care – PPO | Admitting: Physical Therapy

## 2013-08-02 ENCOUNTER — Encounter: Payer: Self-pay | Admitting: Sports Medicine

## 2013-08-02 ENCOUNTER — Ambulatory Visit (INDEPENDENT_AMBULATORY_CARE_PROVIDER_SITE_OTHER): Payer: BC Managed Care – PPO | Admitting: Sports Medicine

## 2013-08-02 VITALS — BP 131/75 | HR 45 | Ht 67.0 in | Wt 150.0 lb

## 2013-08-02 DIAGNOSIS — M25569 Pain in unspecified knee: Secondary | ICD-10-CM

## 2013-08-02 DIAGNOSIS — M25561 Pain in right knee: Secondary | ICD-10-CM

## 2013-08-02 MED ORDER — TRAMADOL HCL 50 MG PO TABS: 50.0000 mg | ORAL_TABLET | Freq: Four times a day (QID) | ORAL | Status: DC | PRN

## 2013-08-02 MED ORDER — DIAZEPAM 5 MG PO TABS: 5.0000 mg | ORAL_TABLET | Freq: Every evening | ORAL | Status: DC | PRN

## 2013-08-02 NOTE — Progress Notes (Signed)
  Subjective:    Patient ID: William Underwood, male    DOB: 1951-04-16, 62 y.o.   MRN: [id number]  HPI 62 yo male who presents for evaluation of right knee pain s/p fall on Friday.  Patient lost balance on 2 concrete steps leading to garage. He fell on his knees at which point he felt instant pain along the medial aspect of his knee with knee swelling. The pain made it difficult to walk.  This morning, the swelling had resolved and pain was down to a 2-3/10.   - post concussion syndrome: has occasional vertigo. He feels like it might be starting back again. He is trying home exercises, but was hoping to get referral back to vestibular rehab in case it gets worst. Requesting diazepam refill for tinnitus. Tramadol for his cervical neuropathy - uses sparingly.  Review of Systems Negative except per HPI    Objective:   Physical Exam Filed Vitals:   08/02/13 1628  BP: 131/75  Pulse: 45  General: well appearing male, in no acute distress Knee exam: superficial abrasions on both knees no erythema or effusion or obvious bony abnormalities.  Palpation normal with no warmth or joint line tenderness or condyle tenderness except for mild tenderness along medial aspect of patella  ROM normal extension and lower leg rotation.  140 degree flexion Ligaments with solid consistent endpoints including ACL, PCL, LCL, MCL.  Negative Mcmurray's and provocative meniscal tests.  Non painful patellar compression.  Patellar and quadriceps tendons unremarkable.  quadriceps strength is normal.     Assessment & Plan:  62 yo male who presents with right knee pain which has slowly resolved since initial injury 4 days ago, likely from prepatellar bursitis.  1. prepatellar bursitis s/p fall: resolving. Return to running and activity gradually. Will not prescribe compression sleeve, since swelling has resolved.  2. post concussion syndrome with dizziness and tinnitus:  - refill diazepam - referral to vestibular  rehab if needed in a couple of weeks after home exercises.

## 2013-08-02 NOTE — Patient Instructions (Addendum)
For the knee injury, it is likely from prepatellar bursitis.  You can ease into running. If it starts swelling back or hurting, decrease the running and take it more slowly to build back up.

## 2013-12-11 ENCOUNTER — Emergency Department (HOSPITAL_COMMUNITY)
Admission: EM | Admit: 2013-12-11 | Discharge: 2013-12-11 | Disposition: A | Discharge status: Home / Self Care | Payer: BC Managed Care – PPO | Attending: Emergency Medicine | Admitting: Emergency Medicine

## 2013-12-11 ENCOUNTER — Encounter (HOSPITAL_COMMUNITY): Payer: Self-pay | Admitting: Emergency Medicine

## 2013-12-11 ENCOUNTER — Emergency Department (INDEPENDENT_AMBULATORY_CARE_PROVIDER_SITE_OTHER)
Admission: EM | Admit: 2013-12-11 | Discharge: 2013-12-11 | Disposition: A | Discharge status: Nursing Home (Includes ICF) | Payer: BC Managed Care – PPO | Source: Home / Self Care

## 2013-12-11 ENCOUNTER — Emergency Department (HOSPITAL_COMMUNITY): Payer: BC Managed Care – PPO

## 2013-12-11 DIAGNOSIS — R1013 Epigastric pain: Secondary | ICD-10-CM

## 2013-12-11 DIAGNOSIS — R52 Pain, unspecified: Secondary | ICD-10-CM

## 2013-12-11 DIAGNOSIS — R1012 Left upper quadrant pain: Secondary | ICD-10-CM

## 2013-12-11 DIAGNOSIS — R197 Diarrhea, unspecified: Secondary | ICD-10-CM | POA: Insufficient documentation

## 2013-12-11 DIAGNOSIS — Z87448 Personal history of other diseases of urinary system: Secondary | ICD-10-CM | POA: Insufficient documentation

## 2013-12-11 DIAGNOSIS — Z79899 Other long term (current) drug therapy: Secondary | ICD-10-CM | POA: Insufficient documentation

## 2013-12-11 LAB — BASIC METABOLIC PANEL
CO2: 28 mEq/L (ref 19–32)
GFR calc non Af Amer: >90 mL/min (ref >90)
Glucose, Bld: 89 mg/dL (ref 70–99)
Potassium: 4.2 mEq/L (ref 3.7–5.3)
Sodium: 144 mEq/L (ref 137–147)

## 2013-12-11 LAB — POCT I-STAT TROPONIN I: Troponin i, poc: 0.01 ng/mL (ref 0.00–0.08)

## 2013-12-11 LAB — CBC
HCT: 40.8 % (ref 39.0–52.0)
Hemoglobin: 13.8 g/dL (ref 13.0–17.0)
MCH: 30.6 pg (ref 26.0–34.0)
MCHC: 33.8 g/dL (ref 30.0–36.0)
MCV: 90.5 fL (ref 78.0–100.0)
Platelets: 199 10*3/uL (ref 150–400)
RBC: 4.51 MIL/uL (ref 4.22–5.81)
RDW: 13.6 % (ref 11.5–15.5)
WBC: 8.1 K/uL (ref 4.0–10.5)

## 2013-12-11 LAB — BASIC METABOLIC PANEL WITH GFR
BUN: 16 mg/dL (ref 6–23)
Calcium: 9.9 mg/dL (ref 8.4–10.5)
Chloride: 103 meq/L (ref 96–112)
Creatinine, Ser: 0.8 mg/dL (ref 0.50–1.35)
GFR calc Af Amer: >90 mL/min (ref >90)

## 2013-12-11 NOTE — ED Provider Notes (Signed)
CSN: [id number]     Arrival date & time 12/11/13  1205 History   First MD Initiated Contact with Patient 12/11/13 1221     Chief Complaint  Patient presents with  . Abdominal Pain     (Consider location/radiation/quality/duration/timing/severity/associated sxs/prior Treatment) HPI Comments: 63 year old male presents with a complaint of "indigestion". Were specifically he is complaining of epigastric and left upper quadrant pressure. It initially started out in the left upper quadrant pain migrating to the epigastrium. He states he has been feeling a lot of rumbling in gas in his abdomen. He denies constipation but has had a few hours of loose stools. The pain is worse when supine and better when sitting. It does not radiate into the chest. He denies chest pressure, heaviness, fullness or tightness. Other associated symptoms include headache, loose stools as above. He denies GERD symptoms. 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 He states it was a good run   and experienced no symptoms during or after the run..   Past Medical History  Diagnosis Date  . Prostatitis    Past Surgical History  Procedure Laterality Date  . Knee arthroscopy    . Ankle arthroscopy     Family History  Problem Relation Age of Onset  . Hearing loss Father    History  Substance Use Topics  . Smoking status: Never Smoker   . Smokeless tobacco: Never Used  . Alcohol Use: No    Review of Systems  Constitutional: Negative for fever, chills, activity change, appetite change and fatigue.  HENT: Negative.  Negative for congestion.   Respiratory: Negative for cough and shortness of breath.   Cardiovascular: Negative for chest pain, palpitations and leg swelling.  Gastrointestinal: Positive for abdominal pain and diarrhea. Negative for nausea, vomiting, constipation and blood in stool.  Genitourinary: Negative.   Skin: Negative.   Neurological: Negative.       Allergies  Review of  patient's allergies indicates no known allergies.  Home Medications   Current Outpatient Rx  Name  Route  Sig  Dispense  Refill  . Tamsulosin HCl (FLOMAX) 0.4 MG CAPS   Oral   Take 0.4 mg by mouth daily.           Marland Kitchen EXPIRED: clonazePAM (KLONOPIN) 0.5 MG tablet   Oral   Take 1 tablet (0.5 mg total) by mouth as directed. Take 1/2 tablet  Morning and midday.  Take 1 tablet at nighttime.   60 tablet   2   . diazepam (VALIUM) 5 MG tablet   Oral   Take 1 tablet (5 mg total) by mouth at bedtime as needed.   30 tablet   2   . traMADol (ULTRAM) 50 MG tablet   Oral   Take 1 tablet (50 mg total) by mouth every 6 (six) hours as needed for pain.   50 tablet   2    BP 138/71  Pulse 51  Temp(Src) 98.2 F (36.8 C) (Oral)  Resp 16  SpO2 99% Physical Exam  Nursing note and vitals reviewed. Constitutional: He is oriented to person, place, and time. He appears well-developed and well-nourished. No distress.  Neck: Normal range of motion. Neck supple.  Cardiovascular: Normal rate, regular rhythm, normal heart sounds and intact distal pulses.  Exam reveals no gallop and no friction rub.   No murmur heard. No carotid bruits  Pulmonary/Chest: Effort normal. No respiratory distress. He has no wheezes. He has no rales.  Abdominal: Soft. He  exhibits no distension and no mass. There is no tenderness. There is no rebound and no guarding.  BS hyperactive scaphoid  Musculoskeletal: Normal range of motion. He exhibits no edema.  Lymphadenopathy:    He has no cervical adenopathy.  Neurological: He is alert and oriented to person, place, and time. He exhibits normal muscle tone.  Skin: Skin is warm and dry. No rash noted.  Psychiatric: He has a normal mood and affect.    ED Course  Procedures (including critical care time) Labs Review Labs Reviewed - No data to display Imaging Review No results found. EKG: NSR/bradycardia, no ectopy. Early repol V2.   MDM   Final diagnoses:  Acute  epigastric pain  Left upper quadrant pain   On the surface the sx's could be abdominal gas pain but am concerned of the pain described as pressure across the lower chest/ upper abdomen in a 61 y o m. Stable at this time and now pain free and looks well. NSR, nl VS. May transfer via shuttle.       Janne Napoleon, NP 12/11/13 [place], NP 12/11/13 1308

## 2013-12-11 NOTE — ED Provider Notes (Signed)
CSN: [id number]     Arrival date & time 12/11/13  1314 History   First MD Initiated Contact with Patient 12/11/13 1353     Chief Complaint  Patient presents with  . Chest Pain     (Consider location/radiation/quality/duration/timing/severity/associated sxs/prior Treatment) HPI Comments: Pt is non smoker who has been a serious runner for 20 years, ran a half marathon yesterday and did very well, did not have CP or tightness during race, but had a great time and admits he ran a little harder than usual.  He ate a [place] burger after the race and last nigth only had cereal.  No fevers, no sick contacts.  He had upper epigastric and LUQ gurgling and pain, felt a little better with sitting up.  He then had 3 episodes of diarrhea.  He though pain would be better this AM, but not resolved thus came to the urgent care.  Pt reports normally has a slow resting HR.  At this point now, pt's pain has completely resolved.  Has not been SOB, sweaty, nauseated.    Patient is a 63 y.o. male presenting with chest pain. The history is provided by the patient and the spouse.  Chest Pain Pain location:  Epigastric Pain quality: pressure and throbbing   Pain quality comment:  Associated with gurgling  Pain radiates to:  Does not radiate Pain radiates to the back: no   Pain severity:  Moderate Onset quality:  Sudden Duration:  12 hours Timing:  Constant Progression:  Resolved Chronicity:  New Relieved by:  Leaning forward Worsened by:  Nothing tried Ineffective treatments:  None tried Associated symptoms: abdominal pain   Associated symptoms: no back pain, no cough, no fever, no nausea, no shortness of breath and not vomiting   Associated symptoms comment:  3 episodes of diarrhea last night    Past Medical History  Diagnosis Date  . Prostatitis    Past Surgical History  Procedure Laterality Date  . Knee arthroscopy    . Ankle arthroscopy     Family History  Problem Relation Age of Onset  .  Hearing loss Father    History  Substance Use Topics  . Smoking status: Never Smoker   . Smokeless tobacco: Never Used  . Alcohol Use: No    Review of Systems  Constitutional: Negative for fever.  Respiratory: Negative for cough and shortness of breath.   Cardiovascular: Positive for chest pain.  Gastrointestinal: Positive for abdominal pain and diarrhea. Negative for nausea and vomiting.  Musculoskeletal: Negative for back pain.  Skin: Negative for rash.  All other systems reviewed and are negative.      Allergies  Codeine  Home Medications   Current Outpatient Rx  Name  Route  Sig  Dispense  Refill  . Cyanocobalamin (B-12 PO)   Oral   Take 1 tablet by mouth daily.         . diazepam (VALIUM) 5 MG tablet   Oral   Take 2.5-5 mg by mouth at bedtime as needed (Sleep).         . naproxen sodium (ANAPROX) 220 MG tablet   Oral   Take 220 mg by mouth daily as needed (Pain).         . Tamsulosin HCl (FLOMAX) 0.4 MG CAPS   Oral   Take 0.4 mg by mouth every evening.          . traMADol (ULTRAM) 50 MG tablet   Oral   Take 1 tablet (50  mg total) by mouth every 6 (six) hours as needed for pain.   50 tablet   2    BP 113/73  Pulse 43  Temp(Src) 97.7 F (36.5 C) (Oral)  Resp 16  Wt 140 lb (63.504 kg)  SpO2 98% Physical Exam  Nursing note and vitals reviewed. Constitutional: He is oriented to person, place, and time. He appears well-developed and well-nourished.  HENT:  Head: Normocephalic and atraumatic.  Eyes: Conjunctivae are normal. No scleral icterus.  Neck: Neck supple.  Cardiovascular: Normal rate, regular rhythm and intact distal pulses.   Pulmonary/Chest: Effort normal. No respiratory distress. He has no wheezes. He has no rales.  Abdominal: Soft. He exhibits no distension. Bowel sounds are increased. There is no tenderness. There is no rebound.  Neurological: He is alert and oriented to person, place, and time. He exhibits normal muscle tone.  Coordination normal.  Skin: Skin is warm.  Psychiatric: He has a normal mood and affect.    ED Course  Procedures (including critical care time) Labs Review Labs Reviewed  CBC  BASIC METABOLIC PANEL  POCT I-STAT TROPONIN I   Imaging Review Dg Chest Port 1 View  12/11/2013   CLINICAL DATA:  Left-sided chest pain.  EXAM: PORTABLE CHEST - 1 VIEW  COMPARISON:  PA and lateral chest 10/20/2005.  FINDINGS: Lungs are clear. Heart size is normal. No pneumothorax or pleural fluid. No focal bony abnormality.  IMPRESSION: Negative chest.   Electronically Signed   By: Inge Rise M.D.   On: 12/11/2013 14:37    EKG Interpretation   ECG at time 14:05 shows sinus bradycardia at rate 46, RVR` in leads V1, V2, likely normal, early repol and  Borderline LVH criteria.      RA sat is 98% and I interpret to be normal MDM   Final diagnoses:  Epigastric pain    Pt with upper epigastric discomfort and diarrhea with radiation of pain to chest.  Pain resolved.  Pt has never had anginal symptoms in the past.  Pain resolved.  ECG shwos no acute ischmemia.  Troponin is normal.  I suspect pain was from transient mesenteric issue, likely related to running and possibly mild dehydration and lactic acid buildup that is now resolved.  Will d/c home and pt can follow up with PCP as needed.      Saddie Benders. Dorna Mai, MD 12/12/13 [phone]

## 2013-12-11 NOTE — ED Provider Notes (Signed)
Medical screening examination/treatment/procedure(s) were performed by a resident physician or non-physician practitioner and as the supervising physician I was immediately available for consultation/collaboration.  Lynne Leader, MD   Gregor Hams, MD 12/11/13 [phone]

## 2013-12-11 NOTE — ED Notes (Signed)
PT reports CP started at [place] first started on Lt lower ABD and moved to central chest. Cp was pressure like and continued all night. Pt reported then he had diarrhea and nausea. Pt took 325 ASA at home . Pt reports to be CP free on arrival to room D-35

## 2013-12-11 NOTE — ED Notes (Signed)
Started with pressure in lower left ribcage @ 2400 - was unable to sleep due to discomfort.  Pain has since moved to epigastric area; feel like "a pressure".  Has had nausea and some diarrhea.  Has had "a lot of gurgling, gas, and burping".  Has had a couple episodes of lightheadedness.  Denies SOB.  Took TUMS without any relief; took 325mg  ASA this morning "as a precaution".  Ran a half marathon yesterday; states HR normally in low 40's.

## 2013-12-11 NOTE — Discharge Instructions (Signed)
Abdominal Pain, Adult °Many things can cause abdominal pain. Usually, abdominal pain is not caused by a disease and will improve without treatment. It can often be observed and treated at home. Your health care provider will do a physical exam and possibly order blood tests and X-rays to help determine the seriousness of your pain. However, in many cases, more time must pass before a clear cause of the pain can be found. Before that point, your health care provider may not know if you need more testing or further treatment. °HOME CARE INSTRUCTIONS  °Monitor your abdominal pain for any changes. The following actions may help to alleviate any discomfort you are experiencing: °· Only take over-the-counter or prescription medicines as directed by your health care provider. °· Do not take laxatives unless directed to do so by your health care provider. °· Try a clear liquid diet (broth, tea, or water) as directed by your health care provider. Slowly move to a bland diet as tolerated. °SEEK MEDICAL CARE IF: °· You have unexplained abdominal pain. °· You have abdominal pain associated with nausea or diarrhea. °· You have pain when you urinate or have a bowel movement. °· You experience abdominal pain that wakes you in the night. °· You have abdominal pain that is worsened or improved by eating food. °· You have abdominal pain that is worsened with eating fatty foods. °SEEK IMMEDIATE MEDICAL CARE IF:  °· Your pain does not go away within 2 hours. °· You have a fever. °· You keep throwing up (vomiting). °· Your pain is felt only in portions of the abdomen, such as the right side or the left lower portion of the abdomen. °· You pass bloody or black tarry stools. °MAKE SURE YOU: °· Understand these instructions.   °· Will watch your condition.   °· Will get help right away if you are not doing well or get worse.   °Document Released: 07/23/2005 Document Revised: 08/03/2013 Document Reviewed: 06/22/2013 °Exit[employer]® Patient  Information ©2014 [employer]. ° °

## 2013-12-11 NOTE — ED Notes (Signed)
Pt is in a gown and on the monitor. 

## 2014-02-23 ENCOUNTER — Ambulatory Visit (INDEPENDENT_AMBULATORY_CARE_PROVIDER_SITE_OTHER): Payer: BC Managed Care – PPO | Admitting: Sports Medicine

## 2014-02-23 ENCOUNTER — Encounter: Payer: Self-pay | Admitting: Sports Medicine

## 2014-02-23 VITALS — BP 126/79 | Ht 67.0 in | Wt 143.0 lb

## 2014-02-23 DIAGNOSIS — M25559 Pain in unspecified hip: Secondary | ICD-10-CM

## 2014-02-23 DIAGNOSIS — F0781 Postconcussional syndrome: Secondary | ICD-10-CM

## 2014-02-23 DIAGNOSIS — M25552 Pain in left hip: Secondary | ICD-10-CM

## 2014-02-23 MED ORDER — NITROGLYCERIN 0.2 MG/HR TD PT24: MEDICATED_PATCH | TRANSDERMAL | Status: DC

## 2014-02-23 NOTE — Assessment & Plan Note (Signed)
We will try a nitroglycerin protocol  Home exercise program  Consider steroid injection if not improving after 3 weeks  Monitor his gait to see if this improves

## 2014-02-23 NOTE — Progress Notes (Signed)
Subjective:     Patient ID: William Underwood, male   DOB: 1951-01-04, 63 y.o.   MRN: [id number]  HPI CALVERT CHARLAND is a 63 y.o. 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 He denies trauma or erythema. May have had mild swelling over posterior lateral hip. He reports the pain started 1 month ago while running. He felt the pain about 2 miles into his run. He has felt pain with every run about 2 miles into his run, and by 7 miles it becomes intolerable. His hip feels the best first thing in the morning prior to activity. He has had piriformis syndrome prior and this feels different to him. He is taking a few aleves a day without relief. He is training for a marathon on June 20th.   Of note he has had a concussion in the past and feels like he never has recovered full balance since. He feels his running stance has changed since his concussion.   Review of Systems Negative, with the exception of above mentioned in HPI  Objective:   Physical Exam BP 126/79  Ht 5\' 7"  (1.702 m)  Wt 143 lb (64.864 kg)  BMI 22.39 kg/m2 Gen: well developed, well nourished, physically fit male  Left hip: No erythema or swelling. TTP over left piriformis and posterior to left greater trochanter. Positive FABER SI. Negative SLR left. Weakness and pain with abduction and adduction, gluteus medius and max weakness. Hip flexor are strong, with pain against resistance. Neurovascularly intact distally.  With running left hip and leg swing outwards from neutral position. He is getting poor hip lift on the left. His foot strike is slightly supinated as well.  MSK [place] was performed today in the office.  Left hip: Hypoechoic changes noted with the gluteus medius. Left hip trochanteric bursa with hypoechoic changes.     Assessment/Plan:     KAPONO LUHN is a 63 y.o.  Male runner with tendonitis of left gluteus medius and left hip bursitis per [place] today.  - Nitro protocol explained, patient without  contraindication.  - Pretzel stretch three times a day.  - ABD/ADD and hip flexor exercises daily - Drills: 75 yards 5-10 times a day with short steps, good hip and knee flexion.  - Consider steroid injection 3 weeks from event (event June 20) - F/U 3 weeks

## 2014-02-23 NOTE — Patient Instructions (Signed)

## 2014-02-23 NOTE — Assessment & Plan Note (Signed)
He seems to have some persistent balance issues that have followed his concussion  This may be contributing to the change in his running gait

## 2014-03-21 ENCOUNTER — Encounter: Payer: Self-pay | Admitting: Sports Medicine

## 2014-03-21 ENCOUNTER — Ambulatory Visit (INDEPENDENT_AMBULATORY_CARE_PROVIDER_SITE_OTHER): Payer: BC Managed Care – PPO | Admitting: Sports Medicine

## 2014-03-21 VITALS — BP 128/62 | Ht 67.0 in | Wt 143.0 lb

## 2014-03-21 DIAGNOSIS — H8110 Benign paroxysmal vertigo, unspecified ear: Secondary | ICD-10-CM

## 2014-03-21 DIAGNOSIS — M25552 Pain in left hip: Secondary | ICD-10-CM

## 2014-03-21 DIAGNOSIS — M25559 Pain in unspecified hip: Secondary | ICD-10-CM

## 2014-03-21 MED ORDER — DIAZEPAM 5 MG PO TABS: 2.5000 mg | ORAL_TABLET | Freq: Every evening | ORAL | Status: DC | PRN

## 2014-03-21 MED ORDER — TRAMADOL HCL 50 MG PO TABS: 50.0000 mg | ORAL_TABLET | Freq: Four times a day (QID) | ORAL | Status: DC | PRN

## 2014-03-21 MED ORDER — METHYLPREDNISOLONE ACETATE 40 MG/ML IJ SUSP
40.0000 mg | Freq: Once | INTRAMUSCULAR | Status: AC
  Administered 2014-03-21: 40 mg via INTRA_ARTICULAR

## 2014-03-21 NOTE — Progress Notes (Signed)
Patient ID: William Underwood, male   DOB: [birth year], 63 y.o.   MRN: [id number]    [facility] [address] [place], [place] [address] Phone: [phone] Fax: [phone]   Patient Name: William Underwood Date of Birth: 20-Jan-1951 Medical Record Number: [id number] Gender: male Date of Encounter: 03/21/2014  History of Present Illness:  William Underwood is a 63 y.o. very pleasant male patient who presents with the following:  L hip pain  He describes two separate hip pains.   The first, and more severe, started about 2 months ago. He describes it as L lateral sharp hip pain that feels like its going straight through his hip joint. At first it took a few miles of running to start it and now it is hurting all the time. He has still been able to run 20 mile runs but they take several days of preparation and hurts constantly through the run. HE state that he has noticed some IT band involvement with L lat hip pain radiating down the lat thigh into his knee on occasion but doesn't feel this is the main pain generator.   The other L hip pain is consistent with his usual piriformis syndrome. He states it has bothered him off and on for quite some time. It improves with stretching and doesn't limit him significantly. He describes it a L glut pain with tingly paraesthesias down to his posterior thigh originating around his SI joint.   NTG may have helped some but pain still flares with runs Has a marathon in 3.5 wks  Patient Active Problem List   Diagnosis Date Noted  . Hip pain, left 02/23/2014  . Ankle pain 05/19/2012  . Left ankle sprain 05/19/2012  . BPPV (benign paroxysmal positional vertigo) 07/01/2011  . Concussion syndrome 05/15/2011  . DEGENERATIVE JOINT DISEASE, HANDS 10/16/2009  . SHOULDER PAIN, RIGHT 08/04/2008  . INGUINAL PAIN, RIGHT 08/04/2008  . NEUROPATHY 03/01/2008  . SYMPTOM, PAIN, ABDOMINAL, RIGHT UP QUADRANT 12/31/2006  . INJURY NOS,  HIP/THIGH 12/31/2006   Past Medical History  Diagnosis Date  . Prostatitis    Past Surgical History  Procedure Laterality Date  . Knee arthroscopy    . Ankle arthroscopy     History  Substance Use Topics  . Smoking status: Never Smoker   . Smokeless tobacco: Never Used  . Alcohol Use: No   Family History  Problem Relation Age of Onset  . Hearing loss Father    Allergies  Allergen Reactions  . Codeine     agitation    Medication list has been reviewed and updated.  Prior to Admission medications   Medication Sig Start Date End Date Taking? Authorizing Provider  Cyanocobalamin (B-12 PO) Take 1 tablet by mouth daily.    Historical Provider, MD  diazepam (VALIUM) 5 MG tablet Take 2.5-5 mg by mouth at bedtime as needed (Sleep).    Historical Provider, MD  naproxen sodium (ANAPROX) 220 MG tablet Take 220 mg by mouth daily as needed (Pain).    Historical Provider, MD  nitroGLYCERIN (NITRODUR - DOSED IN MG/24 HR) 0.2 mg/hr patch Take 1/4 patch daily to the affected hip 02/23/14   Stefanie Libel, MD  Tamsulosin HCl (FLOMAX) 0.4 MG CAPS Take 0.4 mg by mouth every evening.     Historical Provider, MD  traMADol (ULTRAM) 50 MG tablet Take 1 tablet (50 mg total) by mouth every 6 (six) hours as needed for pain. 08/02/13   Stefanie Libel, MD  Review of Systems:  Per HPI  Physical Examination: Filed Vitals:   03/21/14 0950  BP: 128/62   Filed Vitals:   03/21/14 0947  Height: 5\' 7"  (1.702 m)  Weight: 143 lb (64.864 kg)   Body mass index is 22.39 kg/(m^2).  Gen: NAD, alert, cooperative with exam HEENT: NCAT Neuro: Alert and oriented, No gross deficits MSK: RT Hip No TTP over greater troch,  Hip strength full in ext, flexion, abduction  Pain and 4/5 strength in abduction with partial flexion or ext No limp No gross deformity or erythema  Injection L glutesu medius injection Area marked with pressure from pen, prepped and cleaned with alcohol X 2, Depo medrol/lidocaine in  74mL:5 mL injected without complication. Patient tolerated procedure well.   Time out and consent obtained  Assessment and Plan:   Hip pain, left Continued and worsened - likely due to gluteus medius scarring - Injected with 1:5 depo medrol per note - Continue HEP - F/u PRN - Refilled valium for BPPV, tramadol for pain    Timmothy Euler, MD

## 2014-03-21 NOTE — Assessment & Plan Note (Addendum)
Continued and worsened - likely due to gluteus medius scarring - Injected with 1:5 depo medrol per note - Continue HEP  We will consider a 2nd 40 mgm depomedrol if needed prior to marathon Basically not likely to completely resolve until running less distance  Cont NTG - F/u PRN - Refilled valium for BPPV, tramadol for pain

## 2014-03-21 NOTE — Patient Instructions (Signed)
Great to meet you today!  It seems like your pain is still coming from your gluteus medius.

## 2014-04-05 ENCOUNTER — Ambulatory Visit (INDEPENDENT_AMBULATORY_CARE_PROVIDER_SITE_OTHER): Payer: BC Managed Care – PPO | Admitting: Sports Medicine

## 2014-04-05 ENCOUNTER — Encounter: Payer: Self-pay | Admitting: Sports Medicine

## 2014-04-05 VITALS — BP 126/70 | Ht 67.0 in | Wt 143.0 lb

## 2014-04-05 DIAGNOSIS — M25552 Pain in left hip: Secondary | ICD-10-CM

## 2014-04-05 DIAGNOSIS — M25559 Pain in unspecified hip: Secondary | ICD-10-CM

## 2014-04-05 DIAGNOSIS — M533 Sacrococcygeal disorders, not elsewhere classified: Secondary | ICD-10-CM | POA: Insufficient documentation

## 2014-04-05 MED ORDER — METHYLPREDNISOLONE ACETATE 40 MG/ML IJ SUSP
40.0000 mg | Freq: Once | INTRAMUSCULAR | Status: AC
  Administered 2014-04-05: 40 mg via INTRA_ARTICULAR

## 2014-04-05 NOTE — Assessment & Plan Note (Signed)
Last visit, thought due to gluteus medius scarring. Injected and symptoms improved, with today's exam with 5/5 L gluteus medius strength. Today's exam suggests left SI joint irritation/impingement from lumbar fascia, with concomitant hip rotator/ piriformis stretching causing anterior pain.  - 40mg  depo medrol injected into left SI joint today. - Recommended left SI joint opening stretch (hip flexion with external rotation). - Recommended resting in 1.5 weeks leading to marathon and 4 weeks after. - F/u 4-6 weeks to re-assess.

## 2014-04-05 NOTE — Progress Notes (Signed)
Subjective:   CC: F/u left hip pain  HPI:   Patient is a 63 y.o. male with h/o left hip pain thought due to gluteus medius scarring. At last visit ~2 weeks ago, he was injected with 1:5 depomedrol 40mg  and reports this improved pain. However, pain reoccurred after he restarted training 2 days later and he has intermittently rested and restarted training with persistent inability to complete 5 miles. Pain is left buttock just lateral to SI joint and becomes very severe, extending down buttock and into lateral upper thigh. He has been taking aleve BID and doing stretches and exercises prescribed at last visit. He denies fevers, chills, or swelling. Range of motion left hip is partially limited with flexion and external rotation causing reproduced pain and feeling stiff. He has also worked on a more neutral run (versus previously when he was swinging left leg out).  Review of Systems - Per HPI.   SH: Patient has a marathon coming up in ~1.5 weeks    Objective:  Physical Exam BP 126/70  Ht 5\' 7"  (1.702 m)  Wt 143 lb (64.864 kg)  BMI 22.39 kg/m2 GEN: NAD MSK:  Left hip no deformity, erythema, or swelling. Tenderness at left SI joint and just below and lateral. ROM left hip 80 degrees, right hip 100 degrees. Hip flexion and abduction 4+/5, with reproduced pain with abduction against resistance. Positive left faber and fadir test. Negative right faber. Pelvis is stable, less flexibility noticeable on left. 5/5 strength with hip extension Gait: Minimal left leg kickout with jog/  Gait is clearly improved/  He also now has some hip flexion on left.    Assessment:     William Underwood is a 63 y.o. male with h/o left hip pain here for follow-up.    Plan:     Left hip pain Last visit, thought due to gluteus medius scarring. Injected and symptoms improved, with today's exam with 5/5 L gluteus medius strength. Today's exam suggests left SI joint irritation/impingement from lumbar fascia, with  concomitant hip rotator/ piriformis stretching causing anterior pain.  - 40mg  depo medrol injected into left SI joint today. - Recommended left SI joint opening stretch (hip flexion with external rotation). - Recommended resting in 1.5 weeks leading to marathon and 4 weeks after. - F/u 4-6 weeks to re-assess.   Hilton Sinclair, MD [place]  Reviewed/edited   Ila Mcgill, MD

## 2014-04-05 NOTE — Assessment & Plan Note (Signed)
Procedure:  Injection of left SI J Consent obtained and verified. Time-out conducted. Noted no overlying erythema, induration, or other signs of local infection. Skin prepped in a sterile fashion. Topical analgesic spray: Ethyl chloride. Completed without difficulty. Note to do this injection we externally rotated left hip and elevated leg onto exam table CSI into cleft of SI Meds: 1 ccl solumedrol 40 and 4cc lidocaine 1% Pain immediately improved suggesting accurate placement of the medication. Advised to call if fevers/chills, erythema, induration, drainage, or persistent bleeding.  Advised to work motion and limit activity until time for marathon  Reck gait 4 to 6 wks post marathon

## 2014-05-09 ENCOUNTER — Ambulatory Visit (INDEPENDENT_AMBULATORY_CARE_PROVIDER_SITE_OTHER): Payer: BC Managed Care – PPO | Admitting: Sports Medicine

## 2014-05-09 VITALS — BP 121/76 | Ht 67.0 in | Wt 143.0 lb

## 2014-05-09 DIAGNOSIS — M533 Sacrococcygeal disorders, not elsewhere classified: Secondary | ICD-10-CM

## 2014-05-09 DIAGNOSIS — M25552 Pain in left hip: Secondary | ICD-10-CM

## 2014-05-09 DIAGNOSIS — M25559 Pain in unspecified hip: Secondary | ICD-10-CM

## 2014-05-09 NOTE — Patient Instructions (Signed)
Problem: Gluteus medius tendinopathy at the insertion on the greater trochanter  Training recommendations: Discontinue long-distance running and work on full tries of interval training 300 yards followed by 100 yards walking particularly on the Tracker flat surface. Start with more biking he does stationary or road bike for crosstraining for the next 3-4 weeks  Restart nitroglycerin patches at the insertion site of the muscle at the point of most tenderness Work on strengthening gluteal muscles with hip extensions on all fours. Were hamstrings and quad strengthening in the gym Stretch IT band daily

## 2014-05-09 NOTE — Assessment & Plan Note (Addendum)
-  Patient responded well to corticosteroid injections however his aggressive training and marathon running are contributing to continued progression of his tendinopathy -Restart nitroglycerin protocol to the gluteus medius insertion -Modify training to introduce more crosstraining of biking or swimming -Modified running plan to interval training approximately 300 yards with walking for 100 yards on a track using full strides -Followup in 6 weeks

## 2014-05-09 NOTE — Progress Notes (Signed)
  William Underwood - 63 y.o. male MRN [id number]  Date of birth: [birth year]  SUBJECTIVE:  Including CC & ROS.  [name] is a 63 year old Caucasian male who presents today for followup left lateral hip pain. Patient was seen approximately 4 weeks ago in early June concerning his left lateral hip pain and SI joint pain. At that time the patient was preparing for a marathon that was coming up in a couple weeks and he had CSI for pain relief to proceed with this marathon run. At that time his localizes his pain to his left buttocks just lateral to the SI joint the pain had become severe with aching sensation down into the buttocks and lateral thigh. Patient received a 1:5 Depo-Medrol injection previously to the greater trochanter bursa and also the left SI joint. Both of these injections provided patient with significant relief and allowed him to complete his marathon. However he reports that approximately mild 16 he started having pain and difficulty in this region. after the marathon he had difficulty with hip motion for approximately one week. For the past 2 weeks he's tried to return to his running however is having significant pain in his lateral hip and also is noticing new pain in his right hamstring insertion probably because of compensation.   ROS: Review of systems otherwise negative except for information present in HPI  HISTORY: Past Medical, Surgical, Social, and Family History Reviewed & Updated per EMR. Pertinent Historical Findings include: History of piriformis syndrome with sciatica April 2015 patient was started on nitroglycerin patches for gluten medius tendinopathy patient's discontinued May 2015 patient received an injection to the greater trochanter bursa and left gluteus medius insertion site with 1:5 Depo-Medrol: Lidocaine. June 2015 patient had an SI joint injection  PHYSICAL EXAM:  VS: BP:121/76 mmHg  HR: bpm  TEMP: ( )  RESP:   HT:5\' 7"  (170.2 cm)   WT:143 lb (64.864 kg)   BMI:22.4 PHYSICAL EXAM: HIP EXAM:  General: Alert and oriented  Skin: warm; dry, intact, no rashes, lesions Vascular: pulses 2+ bilaterally.  No peripheral edema. Neurologically: Sensation to light touch lower extremities equal and intact bilaterally.   Observation - no ecchymosis, edema, or hematoma present over lateral, or posterior soft tissue surrounding the hip Palpation:  Significant point tenderness on the insertion of the gluteus medius onto the greater trochanter No anterior hip joint tenderness mild tenderness of the lateral greater trochanter or bursa No SI joint pain [id number] displays moderate weakness 4/5 in left hip flexion, abduction, and extension this is compared to the right which is 5/5. Pain with Trendelenburg sign and mild weakness no indication of complete tear of the gluteus medius Poor motion on FABER and pain on FADIR only on left  ASSESSMENT & PLAN: See problem based charting & AVS for pt instructions.

## 2014-06-15 ENCOUNTER — Encounter: Payer: Self-pay | Admitting: Sports Medicine

## 2014-06-15 ENCOUNTER — Ambulatory Visit (INDEPENDENT_AMBULATORY_CARE_PROVIDER_SITE_OTHER): Payer: BC Managed Care – PPO | Admitting: Sports Medicine

## 2014-06-15 VITALS — BP 118/62 | HR 57 | Ht 67.0 in | Wt 145.0 lb

## 2014-06-15 DIAGNOSIS — M25559 Pain in unspecified hip: Secondary | ICD-10-CM

## 2014-06-15 DIAGNOSIS — M533 Sacrococcygeal disorders, not elsewhere classified: Secondary | ICD-10-CM

## 2014-06-15 DIAGNOSIS — M25552 Pain in left hip: Secondary | ICD-10-CM

## 2014-06-15 NOTE — Assessment & Plan Note (Addendum)
Persistent left gluteus medius weakness.  Modifications to hip abduction strengthening reviewed per AVS. Pt not tolerating nitroglycerin so okay to discontinue.  Glute bridges were less symptom eliciting than sidestep ups and discussed in detail appropriate frequency, duration and addition of therapeutic exercises to daily regimen  >50% of this 25 minute visit spent in direct patient counseling and/or coordination of care.  If not improving with Therapeutic Exercises and/or OMT will need to re-US vs consider further advanced imaging

## 2014-06-15 NOTE — Patient Instructions (Signed)
Hip Abduction exercises: make sure you feel it in your glutes.  10 daily; work 3 sets of 15.  William Underwood. Lift straight and hold 5 seconds then down.  Work up to same alternate legs you are extending hold. Side steping slow and steady.  Up to 3X15.  Don't start with all of them.    Use the pressure point balls on low back between the two prominent bones.

## 2014-06-15 NOTE — Assessment & Plan Note (Signed)
Persistent condition with improved control condition  - overall much better with decreased running. SI joint with good motion with ASIS compression but suspect sacral somatic dysfunction (maybe underlying) 1. Encouraged continued activity modification and increasing sacral/pelvic therapeutic exercises per below.  2. Will return to OMT clinic if not improving with modified exercises  3. Consider OMT cranial eval with sacral / lumbar manipulation  4. Pt to also try using massage ball in SI Joint/sacral base to facilitate inherent  sacral motion.

## 2014-06-15 NOTE — Progress Notes (Addendum)
  William Underwood - 63 y.o. male MRN [id number]  Date of birth: February 12, 1951  SUBJECTIVE:  Including CC & ROS.  The patient is following up for evaluation of: Left Hip Pain: Reports lateral hip pain is significantly improved following changes in exercise habits. Continue with therapeutic exercises as prescribed and was trying to add additional glue strengthening at the gym but had significant worsening of his posterior sacral and iliolumbar pain. Has significantly decreased running mileage to 35/week (4 days per week) and increased his cycling to 100/wk (3 days per week).  Is no longer using the nitroglycerin due to persistent non-severe headaches with use.  He got significant relief of pain in hip with using NTG so he continued for several weeks.   HISTORY: Past Medical, Surgical, Social, and Family History Reviewed & Updated per EMR. Pertinent Historical Findings include: Recently prolonged concussion recovery Glutemedius tendonopathy on [place] previously.  PHYSICAL EXAM:  VS: BP:118/62 mmHg  HR:57bpm  TEMP: ( )  RESP:   HT:5\' 7"  (170.2 cm)   WT:145 lb (65.772 kg)  BMI:22.8 PHYSICAL EXAM: GENERAL:  Adult caucasian  male. In no discomfort; no respiratory distress  PSYCH:  alert and appropriate, good insight  Hip/Pelvic Exam:        Gen/Palp:  Marked TTP over left PSIS, Inferiolateral angle of sacrum and greater trochanter. TTP over left ITB      ROM/MST:  Normal internal/external rotation of hip. Hip Abduction (TFL predominant) strength 4/5 on left (5/5 on right with improved spontaneous glute recruitment).                  Tests:  Slightly positive Thomas test.  Running Gait & Functional Exam:       Leg Length: Equal in supine; ASIS compression test normal bilateral              General:  Neutral running gait; no significant trendelenberg but slight lateral shift bilaterally;  Strike Pattern:  Narrow based stance, heel/toe  SOMATIC DYSFUNCTION           Lumbar:  L5 rotated Right, sidebent  left            Sacrum:  Left on Right torsion                Pelvis:  Neutral                   ASSESSMENT & PLAN: See problem based charting & AVS for pt instructions.

## 2014-06-17 DIAGNOSIS — R3911 Hesitancy of micturition: Secondary | ICD-10-CM | POA: Insufficient documentation

## 2014-06-17 DIAGNOSIS — K4090 Unilateral inguinal hernia, without obstruction or gangrene, not specified as recurrent: Secondary | ICD-10-CM

## 2014-06-17 DIAGNOSIS — N400 Enlarged prostate without lower urinary tract symptoms: Secondary | ICD-10-CM

## 2014-06-17 HISTORY — DX: Benign prostatic hyperplasia without lower urinary tract symptoms: N40.0

## 2014-06-17 HISTORY — DX: Unilateral inguinal hernia, without obstruction or gangrene, not specified as recurrent: K40.90

## 2014-07-27 ENCOUNTER — Ambulatory Visit: Payer: BC Managed Care – PPO | Admitting: Sports Medicine

## 2015-02-22 ENCOUNTER — Ambulatory Visit (INDEPENDENT_AMBULATORY_CARE_PROVIDER_SITE_OTHER): Payer: BLUE CROSS/BLUE SHIELD | Admitting: Sports Medicine

## 2015-02-22 ENCOUNTER — Encounter: Payer: Self-pay | Admitting: Sports Medicine

## 2015-02-22 VITALS — BP 120/64 | Ht 67.0 in | Wt 145.0 lb

## 2015-02-22 DIAGNOSIS — M79672 Pain in left foot: Secondary | ICD-10-CM | POA: Diagnosis not present

## 2015-02-22 NOTE — Progress Notes (Signed)
Patient ID: William Underwood, male   DOB: 02/12/1951, 64 y.o.   MRN: [id number]  Patient recently ran a marathon Used Lt weight shoes No real diff in training  Since then he has had left heel pain Hurts in morning but lasts for whole day Resolves and he can run qod PF in past but this feels more like contusion  Feels better in more cushion shoe but heel pads are painful  NAD BP 120/64 mmHg  Ht 5\' 7"  (1.702 m)  Wt 145 lb (65.772 kg)  BMI 22.71 kg/m2  No TTP along left arch Good movement of Lt MTP 1 TTP along medial have of calcaneus On palpation there is nodular, tender swelling over this region  [place] The PF looks slt thick at 0.57 and no tear However there is a hypoechoic nodular swelling 1 cm long and about 0.5 cm wide Seen in several views No Fx at calcaneus

## 2015-02-22 NOTE — Assessment & Plan Note (Addendum)
A medial cut out pad for the heel gave him enough relief to walk without a limp  Cont to use this in all shoes  Suspect this will take a few weeks  Icing  Reck if not resolving

## 2015-04-16 ENCOUNTER — Telehealth: Payer: Self-pay | Admitting: *Deleted

## 2015-04-16 ENCOUNTER — Other Ambulatory Visit: Payer: Self-pay | Admitting: *Deleted

## 2015-04-16 ENCOUNTER — Ambulatory Visit
Payer: BLUE CROSS/BLUE SHIELD | Attending: Sports Medicine | Admitting: Rehabilitative and Restorative Service Providers"

## 2015-04-16 DIAGNOSIS — R269 Unspecified abnormalities of gait and mobility: Secondary | ICD-10-CM | POA: Insufficient documentation

## 2015-04-16 DIAGNOSIS — H8111 Benign paroxysmal vertigo, right ear: Secondary | ICD-10-CM | POA: Insufficient documentation

## 2015-04-16 DIAGNOSIS — H8110 Benign paroxysmal vertigo, unspecified ear: Secondary | ICD-10-CM

## 2015-04-16 DIAGNOSIS — F0781 Postconcussional syndrome: Secondary | ICD-10-CM

## 2015-04-16 NOTE — Telephone Encounter (Signed)
Order place for [facility] for vertigo treatment. Pt has had success with using them in the past

## 2015-04-17 ENCOUNTER — Ambulatory Visit: Payer: BLUE CROSS/BLUE SHIELD | Admitting: Rehabilitative and Restorative Service Providers"

## 2015-04-17 DIAGNOSIS — H8111 Benign paroxysmal vertigo, right ear: Secondary | ICD-10-CM

## 2015-04-17 DIAGNOSIS — R269 Unspecified abnormalities of gait and mobility: Secondary | ICD-10-CM

## 2015-04-17 NOTE — Therapy (Signed)
[facility] [address] [place], [place], [address] Phone: [phone]   Fax:  [phone]  Physical Therapy Treatment  Patient Details  Name: William Underwood MRN: [id number] Date of Birth: Apr 10, 1951 Referring Provider:  [name], MD  Encounter Date: 04/17/2015      PT End of Session - 04/17/15 1421    Visit Number 2   Number of Visits 16   Date for PT Re-Evaluation 06/14/15   PT Start Time 7026   PT Stop Time 1358   PT Time Calculation (min) 40 min   Activity Tolerance Patient tolerated treatment well   Behavior During Therapy [facility] for tasks assessed/performed      Past Medical History  Diagnosis Date  . Prostatitis     Past Surgical History  Procedure Laterality Date  . Knee arthroscopy    . Ankle arthroscopy      There were no vitals filed for this visit.  Visit Diagnosis:  Benign paroxysmal positional vertigo, right  Abnormality of gait      Subjective Assessment - 04/17/15 1417    Subjective The patient has been more unsteady this morning.  Continues with vertigo.   Currently in Pain? No/denies            [facility] PT Assessment - 04/16/15 1325    Assessment   Medical Diagnosis BPPV   Onset Date/Surgical Date --  04/09/2015   Prior Therapy Known to our clinic from prior vestibular rehab   Precautions   Precautions Fall  unsteady today   [place] residence   Living Arrangements Spouse/significant other   Prior Function   Level of [place] Retired            Vestibular Assessment - 04/17/15 1417    Horizontal Canal Right   Horizontal Canal Right Duration seconds   Horizontal Canal Right Symptoms Geotrophic  x 30 seconds   Horizontal Canal Left   Horizontal Canal Left Duration seconds   Horizontal Canal Left Symptoms Geotrophic  milder than on the right side                  Vestibular  Treatment/Exercise - 04/17/15 1419    Vestibular Treatment/Exercise   Vestibular Treatment Provided Canalith Repositioning;Habituation   Canalith Repositioning Canal Roll Right   Habituation Exercises Horizontal Roll   Canal Roll Right   Number of Reps  3   Overall Response  Improved Symptoms   Response Details  improved, but did not resolve symptoms   Horizontal Roll   Number of Reps  5   Symptom Description  --  intermittently provokes nystagmus/dizziness               PT Education - 04/17/15 1420    Education provided Yes   Education Details HEP: horizontal roll for habituation   Person(s) Educated Patient;Spouse   Methods Explanation;Demonstration;Handout   Comprehension Returned demonstration;Verbalized understanding          PT Short Term Goals - 04/17/15 1336    PT SHORT TERM GOAL #1   Title The patient will be negative for horizontal canal BPPV.   Baseline Target date 05/15/2015   Time 4   Period Weeks   PT SHORT TERM GOAL #2   Title The patient will tolerate gaze x 1 viewing x 30 seconds at self regulated pace for HEP.   Baseline Target date 05/15/2015   Time 4   Period  Weeks   PT SHORT TERM GOAL #3   Title The patient will report no visual blurring with head turns.   Baseline Target date 05/15/2015   Time 4   Period Weeks           PT Long Term Goals - 04/17/15 1339    PT LONG TERM GOAL #1   Title The patient will report no vertigo with bed mobility.   Baseline Target date 06/15/2015   Time 8   Period Weeks   PT LONG TERM GOAL #2   Title The patient will tolerate gaze x 1 viewing x 60 seconds.   Baseline Target date 06/15/2015   Time 8   Period Weeks               Plan - 04/17/15 1421    Clinical Impression Statement Patient appears to have converted ageotropic (cupulolithiasis) to all geotropic nystagmus (canalithiasis), which should improve treatment with habituation and canolith repositioning.   PT Next Visit Plan Assess  positional symtpoms and treat as indicated.   Consulted and Agree with Plan of Care Patient        Problem List Patient Active Problem List   Diagnosis Date Noted  . Pain of left heel 02/22/2015  . Sacroiliac joint dysfunction of left side 04/05/2014  . Hip pain, left 02/23/2014  . Ankle pain 05/19/2012  . Left ankle sprain 05/19/2012  . BPPV (benign paroxysmal positional vertigo) 07/01/2011  . Concussion syndrome 05/15/2011  . DEGENERATIVE JOINT DISEASE, HANDS 10/16/2009  . SHOULDER PAIN, RIGHT 08/04/2008  . INGUINAL PAIN, RIGHT 08/04/2008  . NEUROPATHY 03/01/2008  . SYMPTOM, PAIN, ABDOMINAL, RIGHT UP QUADRANT 12/31/2006  . INJURY NOS, HIP/THIGH 12/31/2006    Rayanne Padmanabhan, PT 04/17/2015, 2:22 PM  [facility] [address] [place], [place], [address] Phone: [phone]   Fax:  [phone]

## 2015-04-17 NOTE — Therapy (Signed)
[facility] [address] [place], [place], [address] Phone: [phone]   Fax:  [phone]  Physical Therapy Evaluation  Patient Details  Name: William Underwood MRN: [id number] Date of Birth: September 18, 1951 Referring Provider:  Stefanie Libel, MD  Encounter Date: 04/16/2015      PT End of Session - 04/17/15 1333    Visit Number 1   Number of Visits 16   Date for PT Re-Evaluation 06/14/15   PT Start Time 1320   PT Stop Time 1410   PT Time Calculation (min) 50 min   Activity Tolerance Patient tolerated treatment well   Behavior During Therapy [facility] for tasks assessed/performed      Past Medical History  Diagnosis Date  . Prostatitis     Past Surgical History  Procedure Laterality Date  . Knee arthroscopy    . Ankle arthroscopy      There were no vitals filed for this visit.  Visit Diagnosis:  Benign paroxysmal positional vertigo, right  Abnormality of gait      Subjective Assessment - 04/16/15 1322    Subjective The patient reports he has had 2 years of no vertigo and woke up in the middle of the night last Monday with room spinning sensation when rolling in bed.  He noted positional symptoms all week last week, but felt balance was okay until this morning.  He reports his wife did Epley's maneuver at home and his symptoms worsened.  He is experiencing symptoms with rolling to both right and left sides.   Patient Stated Goals Treat dizziness.   Currently in Pain? No/denies            [facility] PT Assessment - 04/16/15 1325    Assessment   Medical Diagnosis BPPV   Onset Date/Surgical Date --  04/09/2015   Prior Therapy Known to our clinic from prior vestibular rehab   Precautions   Precautions Fall  unsteady today   [place] residence   Living Arrangements Spouse/significant other   Prior Function   Level of [place] Retired             Vestibular Assessment - 04/16/15 1327    Symptom Behavior   Type of Dizziness Spinning  unsteadiness   Frequency of Dizziness --  daily   Duration of Dizziness --  seconds   Aggravating Factors --  rolling   Relieving Factors Lying supine;Head stationary   Occulomotor Exam   Spontaneous Left beating nystagmus  mild and noted after positional testing   Gaze-induced Left beating nystagmus with L gaze   Smooth Pursuits Intact   Comment with head movements provokes a sensation of "rocking"   Vestibulo-Occular Reflex   VOR 1 Head Only (x 1 viewing) slow pace with no ability to gaze fixate   Positional Testing   Dix-Hallpike Dix-Hallpike Right;Dix-Hallpike Left   Sidelying Test Sidelying Right;Sidelying Left   Horizontal Canal Testing Horizontal Canal Right;Horizontal Canal Left  when moved back to midline, patient continues L beating   Dix-Hallpike Right   Dix-Hallpike Right Duration *tried second rep and provoked severe Geotropic nystagmus  >60 seconds   Dix-Hallpike Right Symptoms Downbeat, left rotatory nystagmus  switches to L beating (ageotropic) after 15 seconds   Dix-Hallpike Left   Dix-Hallpike Left Duration seconds   Dix-Hallpike Left Symptoms --  mild sensation that is going to come on   Horizontal Canal Right   Horizontal Canal Right Duration seconds  Horizontal Canal Right Symptoms Initially came on mild 1st rep, severe on second rep with ageotropic nature of nystagmus   Horizontal Canal Left   Horizontal Canal Left Duration seconds   Horizontal Canal Left Symptoms Geotrophic  mild in nature     Patient appears to have R horizontal canalithiasis, R horizontal cupulolithiasis, and possible L anterior canalithiasis (initially saw a downbeat nystagmus?).       PT Short Term Goals - 04/17/15 1336    PT SHORT TERM GOAL #1   Title The patient will be negative for horizontal canal BPPV.   Baseline Target date 05/15/2015   Time 4   Period Weeks   PT SHORT TERM  GOAL #2   Title The patient will tolerate gaze x 1 viewing x 30 seconds at self regulated pace for HEP.   Baseline Target date 05/15/2015   Time 4   Period Weeks   PT SHORT TERM GOAL #3   Title The patient will report no visual blurring with head turns.   Baseline Target date 05/15/2015   Time 4   Period Weeks           PT Long Term Goals - 04/17/15 1339    PT LONG TERM GOAL #1   Title The patient will report no vertigo with bed mobility.   Baseline Target date 06/15/2015   Time 8   Period Weeks   PT LONG TERM GOAL #2   Title The patient will tolerate gaze x 1 viewing x 60 seconds.   Baseline Target date 06/15/2015   Time 8   Period Weeks               Plan - 04/17/15 1406    Clinical Impression Statement The patient is a 64 yo male known to our clinic from prior treatment for multi-canal BPPV and concussion.  Patient has had a recurrence of BPPV and appears to have multi-canal as well as both cupulolithiasis and canalithiasis creating persistent nystagmus.  Patient tolerated canolith repositioning maneuvers without full resolution of symptoms.  PT recommended caution due to imbalance.   Pt will benefit from skilled therapeutic intervention in order to improve on the following deficits Abnormal gait;Difficulty walking;Dizziness;Decreased balance   Rehab Potential Good   PT Frequency 2x / week   PT Duration 8 weeks  if needed   PT Treatment/Interventions Therapeutic activities;Therapeutic exercise;Patient/family education;Vestibular;Neuromuscular re-education   PT Next Visit Plan Assess positional symtpoms and treat as indicated.   Consulted and Agree with Plan of Care Patient;Family member/caregiver   Family Member Consulted spouse         Problem List Patient Active Problem List   Diagnosis Date Noted  . Pain of left heel 02/22/2015  . Sacroiliac joint dysfunction of left side 04/05/2014  . Hip pain, left 02/23/2014  . Ankle pain 05/19/2012  . Left ankle  sprain 05/19/2012  . BPPV (benign paroxysmal positional vertigo) 07/01/2011  . Concussion syndrome 05/15/2011  . DEGENERATIVE JOINT DISEASE, HANDS 10/16/2009  . SHOULDER PAIN, RIGHT 08/04/2008  . INGUINAL PAIN, RIGHT 08/04/2008  . NEUROPATHY 03/01/2008  . SYMPTOM, PAIN, ABDOMINAL, RIGHT UP QUADRANT 12/31/2006  . INJURY NOS, HIP/THIGH 12/31/2006    Vibha Ferdig, PT 04/17/2015, 2:10 PM  [facility] [address] [place], [place], [address] Phone: [phone]   Fax:  [phone]

## 2015-04-17 NOTE — Patient Instructions (Signed)
Rolling    With pillow under head, start on back. Roll slowly to right. Hold position until symptoms subside. Roll slowly onto left side. Hold position until symptoms subside. Repeat sequence __5__ times per session. Do __2-3__ sessions per day.  Copyright  VHI. All rights reserved.   

## 2015-04-27 ENCOUNTER — Ambulatory Visit
Payer: BLUE CROSS/BLUE SHIELD | Attending: Sports Medicine | Admitting: Rehabilitative and Restorative Service Providers"

## 2015-04-27 DIAGNOSIS — R269 Unspecified abnormalities of gait and mobility: Secondary | ICD-10-CM

## 2015-04-27 NOTE — Therapy (Signed)
[facility] [address] [place], [place], [address] Phone: [phone]   Fax:  [phone]  Physical Therapy Treatment  Patient Details  Name: William Underwood MRN: [id number] Date of Birth: [birth year] Referring Provider:  [name], MD  Encounter Date: 04/27/2015      PT End of Session - 04/27/15 0842    Visit Number 3   Number of Visits 16   Date for PT Re-Evaluation 06/14/15   PT Start Time 0803   PT Stop Time 0822   PT Time Calculation (min) 19 min   Activity Tolerance Patient tolerated treatment well   Behavior During Therapy [facility] for tasks assessed/performed      Past Medical History  Diagnosis Date  . Prostatitis     Past Surgical History  Procedure Laterality Date  . Knee arthroscopy    . Ankle arthroscopy      There were no vitals filed for this visit.  Visit Diagnosis:  Abnormality of gait      Subjective Assessment - 04/27/15 0804    Subjective "You fixed me."  The patient reports that he did rolling at home exercise and only had one day where he noted minimal symptoms.  He reports that his balance has improved gradually since resolving vertigo symptoms.  The patient is noting continued progress.             NEUROMUSCULAR RE-EDUCATION:      Vestibular Assessment - 04/27/15 0841    Positional Testing   Horizontal Canal Testing Horizontal Canal Right;Horizontal Canal Left   Horizontal Canal Right   Horizontal Canal Right Duration --  none   Horizontal Canal Right Symptoms Normal   Horizontal Canal Left   Horizontal Canal Left Duration --  none   Horizontal Canal Left Symptoms Normal       SELF CARE/HOME MANAGEMENT: Educated patient on effect of BPPV on central decompensation (due to prior h/o vertigo and concussions).  He has HEP to continue promoting vestibular adaptations.  Recommended we keep 1 f/u visit in case any return of symptoms or if current occasional  unsteadiness does not resolve completely with gaze exercises.          PT Education - 04/27/15 0841    Education provided Yes   Education Details Discussed continuing HEP for gaze adaptation and return to activities to tolerance.   Person(s) Educated Patient;Spouse   Methods Explanation;Demonstration   Comprehension Verbalized understanding          PT Short Term Goals - 04/27/15 0807    PT SHORT TERM GOAL #1   Title The patient will be negative for horizontal canal BPPV.   Baseline Met on 04/27/2015   Time 4   Period Weeks   Status Achieved   PT SHORT TERM GOAL #2   Title The patient will tolerate gaze x 1 viewing x 30 seconds at self regulated pace for HEP.   Baseline Met on 04/27/2015   Time 4   Period Weeks   Status Achieved   PT SHORT TERM GOAL #3   Title The patient will report no visual blurring with head turns.   Baseline Met on 04/27/2015   Time 4   Period Weeks   Status Achieved           PT Long Term Goals - 04/27/15 0809    PT LONG TERM GOAL #1   Title The patient will report no vertigo with bed mobility.   Baseline Met on  04/27/2015   Time 8   Period Weeks   Status Achieved   PT LONG TERM GOAL #2   Title The patient will tolerate gaze x 1 viewing x 60 seconds.   Baseline Met for 30 seconds on 04/27/2015   Time 8   Period Weeks   Status Partially Met               Plan - 04/27/15 0502    Clinical Impression Statement The patient was negative today for positional testing.  PT left 1 f/u visit on our schedule to ensure no return of positional vertigo and continued vestibular adaptation with HEP/fitness activities.  Patient to call and cancel remaining visit if symptoms continue to resolve.   PT Next Visit Plan f/u one more visit as needed.   Consulted and Agree with Plan of Care Patient;Family member/caregiver   Family Member Consulted spouse        Problem List Patient Active Problem List   Diagnosis Date Noted  . Pain of left heel  02/22/2015  . Sacroiliac joint dysfunction of left side 04/05/2014  . Hip pain, left 02/23/2014  . Ankle pain 05/19/2012  . Left ankle sprain 05/19/2012  . BPPV (benign paroxysmal positional vertigo) 07/01/2011  . Concussion syndrome 05/15/2011  . DEGENERATIVE JOINT DISEASE, HANDS 10/16/2009  . SHOULDER PAIN, RIGHT 08/04/2008  . INGUINAL PAIN, RIGHT 08/04/2008  . NEUROPATHY 03/01/2008  . SYMPTOM, PAIN, ABDOMINAL, RIGHT UP QUADRANT 12/31/2006  . INJURY NOS, HIP/THIGH 12/31/2006    Moshe Wenger, PT 04/27/2015, 8:43 AM  [facility] [address] [place], [place], [address] Phone: [phone]   Fax:  [phone]

## 2015-05-04 ENCOUNTER — Encounter: Payer: BLUE CROSS/BLUE SHIELD | Admitting: Rehabilitative and Restorative Service Providers"

## 2015-05-09 ENCOUNTER — Encounter: Payer: Self-pay | Admitting: Rehabilitative and Restorative Service Providers"

## 2015-05-09 NOTE — Therapy (Signed)
[facility] [address] [place], [place], [address] Phone: [phone]   Fax:  [phone]  Patient Details  Name: William Underwood MRN: [id number] Date of Birth: 1951/01/18 Referring Provider:  No ref. provider found  Last Encounter Date: 04/27/2015  PHYSICAL THERAPY DISCHARGE SUMMARY  Visits from Start of Care: 3  Current functional level related to goals / functional outcomes:     PT Short Term Goals - 04/27/15 5397    PT SHORT TERM GOAL #1   Title The patient will be negative for horizontal canal BPPV.   Baseline Met on 04/27/2015   Time 4   Period Weeks   Status Achieved   PT SHORT TERM GOAL #2   Title The patient will tolerate gaze x 1 viewing x 30 seconds at self regulated pace for HEP.   Baseline Met on 04/27/2015   Time 4   Period Weeks   Status Achieved   PT SHORT TERM GOAL #3   Title The patient will report no visual blurring with head turns.   Baseline Met on 04/27/2015   Time 4   Period Weeks   Status Achieved         PT Long Term Goals - 04/27/15 0809    PT LONG TERM GOAL #1   Title The patient will report no vertigo with bed mobility.   Baseline Met on 04/27/2015   Time 8   Period Weeks   Status Achieved   PT LONG TERM GOAL #2   Title The patient will tolerate gaze x 1 viewing x 60 seconds.   Baseline Met for 30 seconds on 04/27/2015   Time 8   Period Weeks   Status Partially Met        Remaining deficits: None per phone message.   Education / Equipment: Home program and self management of BPPV.  Plan: Patient agrees to discharge.  Patient goals were met. Patient is being discharged due to meeting the stated rehab goals.  ?????        Thank you for the referral of this patient.   [place], [place] 05/09/2015, [facility] [address] [place], [place], [address] Phone: [phone]   Fax:  [phone]

## 2015-08-07 ENCOUNTER — Ambulatory Visit (INDEPENDENT_AMBULATORY_CARE_PROVIDER_SITE_OTHER): Payer: BLUE CROSS/BLUE SHIELD | Admitting: Sports Medicine

## 2015-08-07 ENCOUNTER — Encounter: Payer: Self-pay | Admitting: Sports Medicine

## 2015-08-07 VITALS — BP 122/77 | HR 49 | Ht 67.0 in | Wt 145.0 lb

## 2015-08-07 DIAGNOSIS — M79672 Pain in left foot: Secondary | ICD-10-CM | POA: Diagnosis not present

## 2015-08-07 NOTE — Assessment & Plan Note (Addendum)
Most likely plantar fasciitis versus possibility of calcaneal stress fracture - Fitted with green sports orthotics with scaphoid pad and additional heel cushioning - Advised to continue plantar fascial stretches and ice massage - Encouraged consistent night splints use, and gentle stretches first thing every morning - Instructed on calf exercises; Handout provided  Note running gait after insoles showed only mild antalgic limp  Reck 2 mos

## 2015-08-07 NOTE — Progress Notes (Signed)
  William Underwood - 64 y.o. male MRN [id number]  Date of birth: 04/26/1951  SUBJECTIVE:     Comes in today for follow-up of persistent left medial plantar heel pain.  He has reduced his activity to running 40 miles a week and now bikes ~ 150 miles week.  His heel pain continues to be aggravated with running, especially if runs 2 days in a row.  Pain also worse first thing in the morning.  Unable to walk barefooted due to pain.  Pain initially began March 2016 after running marathon in minimalist shoes, it has been present since.  Reports history of left plantar fasciitis.  He has been using ice and massage which helps.  He has been wearing night splints for a couple of hours but is unable to leave them on all night.   ROS:     See history of present illness No hx of jont swelling/ no rash/ no back stiffness/ no sciatica  PERTINENT  PMH / PSH FH / / SH:  Past Medical, Surgical, Social, and Family History Reviewed & Updated in the EMR.  Pertinent findings include:   OBJECTIVE: BP 122/77 mmHg  Pulse 49  Ht 5\' 7"  (1.702 m)  Wt 145 lb (65.772 kg)  BMI 22.71 kg/m2  Physical Exam:  Vital signs are reviewed. Left foot: No obvious swelling or ecchymosis.  Tenderness to palpation along medial and lateral plantar aspect of calcaneus; pain reproduced with  dorsiflexion of first ray. Mild pain with heel squeeze but mostly on plantar surface  ASSESSMENT & PLAN:  See problem based charting & AVS for pt instructions.

## 2015-12-19 ENCOUNTER — Encounter: Payer: Self-pay | Admitting: Gastroenterology

## 2015-12-28 ENCOUNTER — Encounter: Payer: Self-pay | Admitting: Internal Medicine

## 2016-01-07 ENCOUNTER — Encounter: Payer: Self-pay | Admitting: Internal Medicine

## 2016-01-16 DIAGNOSIS — Z6822 Body mass index (BMI) 22.0-22.9, adult: Secondary | ICD-10-CM | POA: Diagnosis not present

## 2016-01-16 DIAGNOSIS — Z0000 Encounter for general adult medical examination without abnormal findings: Secondary | ICD-10-CM | POA: Diagnosis not present

## 2016-01-28 DIAGNOSIS — M79642 Pain in left hand: Secondary | ICD-10-CM | POA: Diagnosis not present

## 2016-01-28 DIAGNOSIS — M84842 Other disorders of continuity of bone, left hand: Secondary | ICD-10-CM | POA: Diagnosis not present

## 2016-01-28 DIAGNOSIS — M79641 Pain in right hand: Secondary | ICD-10-CM | POA: Diagnosis not present

## 2016-01-28 DIAGNOSIS — M79672 Pain in left foot: Secondary | ICD-10-CM | POA: Diagnosis not present

## 2016-01-28 DIAGNOSIS — M84841 Other disorders of continuity of bone, right hand: Secondary | ICD-10-CM | POA: Diagnosis not present

## 2016-01-29 ENCOUNTER — Other Ambulatory Visit: Payer: Self-pay | Admitting: Orthopaedic Surgery

## 2016-01-29 DIAGNOSIS — M79672 Pain in left foot: Secondary | ICD-10-CM

## 2016-01-30 ENCOUNTER — Other Ambulatory Visit: Payer: Self-pay | Admitting: Orthopaedic Surgery

## 2016-01-30 DIAGNOSIS — M79672 Pain in left foot: Secondary | ICD-10-CM

## 2016-01-30 DIAGNOSIS — M84372S Stress fracture, left ankle, sequela: Secondary | ICD-10-CM

## 2016-02-01 ENCOUNTER — Ambulatory Visit
Admission: RE | Admit: 2016-02-01 | Discharge: 2016-02-01 | Disposition: A | Discharge status: Home / Self Care | Payer: PPO | Source: Ambulatory Visit | Attending: Orthopaedic Surgery | Admitting: Orthopaedic Surgery

## 2016-02-01 DIAGNOSIS — M84372S Stress fracture, left ankle, sequela: Secondary | ICD-10-CM

## 2016-02-01 DIAGNOSIS — M79672 Pain in left foot: Secondary | ICD-10-CM

## 2016-02-01 DIAGNOSIS — M722 Plantar fascial fibromatosis: Secondary | ICD-10-CM | POA: Diagnosis not present

## 2016-02-01 DIAGNOSIS — M25572 Pain in left ankle and joints of left foot: Secondary | ICD-10-CM | POA: Diagnosis not present

## 2016-02-06 DIAGNOSIS — M722 Plantar fascial fibromatosis: Secondary | ICD-10-CM | POA: Diagnosis not present

## 2016-02-27 ENCOUNTER — Ambulatory Visit (AMBULATORY_SURGERY_CENTER): Payer: Self-pay | Admitting: *Deleted

## 2016-02-27 VITALS — Ht 67.0 in | Wt 144.0 lb

## 2016-02-27 DIAGNOSIS — Z1211 Encounter for screening for malignant neoplasm of colon: Secondary | ICD-10-CM

## 2016-02-27 MED ORDER — NA SULFATE-K SULFATE-MG SULF 17.5-3.13-1.6 GM/177ML PO SOLN: ORAL | Status: DC

## 2016-02-27 NOTE — Progress Notes (Signed)
Patient denies any allergies to eggs or soy. Patient denies any problems with anesthesia/sedation. Patient denies any oxygen use at home and does not take any diet/weight loss medications. Patient declined EMMI education. 

## 2016-02-29 DIAGNOSIS — R509 Fever, unspecified: Secondary | ICD-10-CM | POA: Diagnosis not present

## 2016-02-29 DIAGNOSIS — J029 Acute pharyngitis, unspecified: Secondary | ICD-10-CM | POA: Diagnosis not present

## 2016-02-29 DIAGNOSIS — J209 Acute bronchitis, unspecified: Secondary | ICD-10-CM | POA: Diagnosis not present

## 2016-02-29 DIAGNOSIS — Z6822 Body mass index (BMI) 22.0-22.9, adult: Secondary | ICD-10-CM | POA: Diagnosis not present

## 2016-02-29 DIAGNOSIS — J301 Allergic rhinitis due to pollen: Secondary | ICD-10-CM | POA: Diagnosis not present

## 2016-02-29 DIAGNOSIS — R52 Pain, unspecified: Secondary | ICD-10-CM | POA: Diagnosis not present

## 2016-03-04 ENCOUNTER — Encounter: Payer: Self-pay | Admitting: Internal Medicine

## 2016-03-12 ENCOUNTER — Encounter: Payer: Self-pay | Admitting: Internal Medicine

## 2016-03-12 ENCOUNTER — Ambulatory Visit (AMBULATORY_SURGERY_CENTER): Payer: PPO | Admitting: Internal Medicine

## 2016-03-12 VITALS — BP 90/52 | HR 52 | Temp 98.9°F | Resp 13 | Ht 67.0 in | Wt 144.0 lb

## 2016-03-12 DIAGNOSIS — Z1211 Encounter for screening for malignant neoplasm of colon: Secondary | ICD-10-CM

## 2016-03-12 MED ORDER — SODIUM CHLORIDE 0.9 % IV SOLN: 500.0000 mL | INTRAVENOUS | Status: DC

## 2016-03-12 NOTE — Progress Notes (Signed)
Report to PACU, RN, vss, BBS= Clear.  

## 2016-03-12 NOTE — Patient Instructions (Signed)
Normal colon exam today. Repeat colonoscopy in 10 years. Continue present medications. Call us with any questions or concerns. Thank you!  YOU HAD AN ENDOSCOPIC PROCEDURE TODAY AT [facility]:   Refer to the procedure report that was given to you for any specific questions about what was found during the examination.  If the procedure report does not answer your questions, please call your gastroenterologist to clarify.  If you requested that your care partner not be given the details of your procedure findings, then the procedure report has been included in a sealed envelope for you to review at your convenience later.  YOU SHOULD EXPECT: Some feelings of bloating in the abdomen. Passage of more gas than usual.  Walking can help get rid of the air that was put into your GI tract during the procedure and reduce the bloating. If you had a lower endoscopy (such as a colonoscopy or flexible sigmoidoscopy) you may notice spotting of blood in your stool or on the toilet paper. If you underwent a bowel prep for your procedure, you may not have a normal bowel movement for a few days.  Please Note:  You might notice some irritation and congestion in your nose or some drainage.  This is from the oxygen used during your procedure.  There is no need for concern and it should clear up in a day or so.  SYMPTOMS TO REPORT IMMEDIATELY:   Following lower endoscopy (colonoscopy or flexible sigmoidoscopy):  Excessive amounts of blood in the stool  Significant tenderness or worsening of abdominal pains  Swelling of the abdomen that is new, acute  Fever of 100F or higher   For urgent or emergent issues, a gastroenterologist can be reached at any hour by calling [phone].   DIET: Your first meal following the procedure should be a small meal and then it is ok to progress to your normal diet. Heavy or fried foods are harder to digest and may make you feel nauseous or bloated.  Likewise, meals  heavy in dairy and vegetables can increase bloating.  Drink plenty of fluids but you should avoid alcoholic beverages for 24 hours.  ACTIVITY:  You should plan to take it easy for the rest of today and you should NOT DRIVE or use heavy machinery until tomorrow (because of the sedation medicines used during the test).    FOLLOW UP: Our staff will call the number listed on your records the next business day following your procedure to check on you and address any questions or concerns that you may have regarding the information given to you following your procedure. If we do not reach you, we will leave a message.  However, if you are feeling well and you are not experiencing any problems, there is no need to return our call.  We will assume that you have returned to your regular daily activities without incident.  If any biopsies were taken you will be contacted by phone or by letter within the next 1-3 weeks.  Please call us at [phone] if you have not heard about the biopsies in 3 weeks.    SIGNATURES/CONFIDENTIALITY: You and/or your care partner have signed paperwork which will be entered into your electronic medical record.  These signatures attest to the fact that that the information above on your After Visit Summary has been reviewed and is understood.  Full responsibility of the confidentiality of this discharge information lies with you and/or your care-partner.

## 2016-03-12 NOTE — Op Note (Signed)
[facility] Patient Name: William Underwood Procedure Date: 03/12/2016 11:19 AM MRN: [id number] Endoscopist: Jerene Bears , MD Age: 65 Referring MD:  Date of Birth: [birth year] Gender: Male Procedure:                Colonoscopy Indications:              Screening for colorectal malignant neoplasm, Last                            colonoscopy 10 years ago Medicines:                Monitored Anesthesia Care Procedure:                Pre-Anesthesia Assessment:                           - Prior to the procedure, a History and Physical                            was performed, and patient medications and                            allergies were reviewed. The patient's tolerance of                            previous anesthesia was also reviewed. The risks                            and benefits of the procedure and the sedation                            options and risks were discussed with the patient.                            All questions were answered, and informed consent                            was obtained. Prior Anticoagulants: The patient has                            taken no previous anticoagulant or antiplatelet                            agents. ASA Grade Assessment: II - A patient with                            mild systemic disease. After reviewing the risks                            and benefits, the patient was deemed in                            satisfactory condition to undergo the procedure.  After obtaining informed consent, the colonoscope                            was passed under direct vision. Throughout the                            procedure, the patient's blood pressure, pulse, and                            oxygen saturations were monitored continuously. The                            Model CF-HQ190L [id number]) scope was introduced                            through the anus and advanced to the the cecum,                   identified by appendiceal orifice and ileocecal                            valve. The colonoscopy was performed without                            difficulty. The patient tolerated the procedure                            well. The quality of the bowel preparation was                            good. The ileocecal valve, appendiceal orifice, and                            rectum were photographed. Scope In: 11:30:22 AM Scope Out: 11:45:05 AM Scope Withdrawal Time: 0 hours 9 minutes 33 seconds  Total Procedure Duration: 0 hours 14 minutes 43 seconds  Findings:                 The perianal and digital rectal examinations were                            normal.                           The entire examined colon appeared normal.                           Anal papillae were hypertrophied on retroflexed                            views in the rectum. Complications:            No immediate complications. Estimated Blood Loss:     Estimated blood loss: none. Impression:               - The entire examined colon is normal.                           -  No specimens collected. Recommendation:           - Patient has a contact number available for                            emergencies. The signs and symptoms of potential                            delayed complications were discussed with the                            patient. Return to normal activities tomorrow.                            Written discharge instructions were provided to the                            patient.                           - Resume previous diet.                           - Continue present medications.                           - Repeat colonoscopy in 10 years for screening                            purposes. Jerene Bears, MD 03/12/2016 11:47:48 AM This report has been signed electronically.

## 2016-03-13 ENCOUNTER — Telehealth: Payer: Self-pay

## 2016-03-13 NOTE — Telephone Encounter (Signed)
Left message on answering machine. 

## 2016-05-01 DIAGNOSIS — S7010XA Contusion of unspecified thigh, initial encounter: Secondary | ICD-10-CM | POA: Diagnosis not present

## 2016-05-01 DIAGNOSIS — S4991XA Unspecified injury of right shoulder and upper arm, initial encounter: Secondary | ICD-10-CM | POA: Diagnosis not present

## 2016-05-01 DIAGNOSIS — M25511 Pain in right shoulder: Secondary | ICD-10-CM | POA: Diagnosis not present

## 2016-05-01 DIAGNOSIS — S0990XA Unspecified injury of head, initial encounter: Secondary | ICD-10-CM | POA: Diagnosis not present

## 2016-05-01 DIAGNOSIS — G8911 Acute pain due to trauma: Secondary | ICD-10-CM | POA: Diagnosis not present

## 2016-05-05 ENCOUNTER — Ambulatory Visit: Payer: BLUE CROSS/BLUE SHIELD | Admitting: Family Medicine

## 2016-05-06 ENCOUNTER — Other Ambulatory Visit: Payer: Self-pay | Admitting: *Deleted

## 2016-05-06 MED ORDER — TRAMADOL HCL 50 MG PO TABS: 50.0000 mg | ORAL_TABLET | Freq: Four times a day (QID) | ORAL | Status: DC | PRN

## 2016-06-25 ENCOUNTER — Other Ambulatory Visit: Payer: Self-pay | Admitting: *Deleted

## 2016-06-25 MED ORDER — TRAMADOL HCL 50 MG PO TABS: 50.0000 mg | ORAL_TABLET | Freq: Four times a day (QID) | ORAL | 0 refills | Status: DC | PRN

## 2016-07-09 DIAGNOSIS — Z125 Encounter for screening for malignant neoplasm of prostate: Secondary | ICD-10-CM | POA: Diagnosis not present

## 2016-07-09 DIAGNOSIS — Z0000 Encounter for general adult medical examination without abnormal findings: Secondary | ICD-10-CM | POA: Diagnosis not present

## 2016-07-14 DIAGNOSIS — H9313 Tinnitus, bilateral: Secondary | ICD-10-CM | POA: Diagnosis not present

## 2016-07-14 DIAGNOSIS — Z0000 Encounter for general adult medical examination without abnormal findings: Secondary | ICD-10-CM | POA: Diagnosis not present

## 2016-07-14 DIAGNOSIS — Z23 Encounter for immunization: Secondary | ICD-10-CM | POA: Diagnosis not present

## 2016-07-14 DIAGNOSIS — R209 Unspecified disturbances of skin sensation: Secondary | ICD-10-CM | POA: Diagnosis not present

## 2016-07-14 DIAGNOSIS — Z1389 Encounter for screening for other disorder: Secondary | ICD-10-CM | POA: Diagnosis not present

## 2016-07-14 DIAGNOSIS — M25511 Pain in right shoulder: Secondary | ICD-10-CM | POA: Diagnosis not present

## 2016-07-30 DIAGNOSIS — M24811 Other specific joint derangements of right shoulder, not elsewhere classified: Secondary | ICD-10-CM | POA: Diagnosis not present

## 2016-08-26 DIAGNOSIS — S86912A Strain of unspecified muscle(s) and tendon(s) at lower leg level, left leg, initial encounter: Secondary | ICD-10-CM | POA: Diagnosis not present

## 2016-09-01 DIAGNOSIS — S86912A Strain of unspecified muscle(s) and tendon(s) at lower leg level, left leg, initial encounter: Secondary | ICD-10-CM | POA: Diagnosis not present

## 2016-09-22 DIAGNOSIS — H52223 Regular astigmatism, bilateral: Secondary | ICD-10-CM | POA: Diagnosis not present

## 2016-10-30 ENCOUNTER — Ambulatory Visit: Payer: Self-pay

## 2016-10-30 ENCOUNTER — Encounter: Payer: Self-pay | Admitting: Sports Medicine

## 2016-10-30 ENCOUNTER — Ambulatory Visit (INDEPENDENT_AMBULATORY_CARE_PROVIDER_SITE_OTHER): Payer: PPO | Admitting: Sports Medicine

## 2016-10-30 VITALS — BP 126/70 | Ht 67.0 in | Wt 140.0 lb

## 2016-10-30 DIAGNOSIS — R1031 Right lower quadrant pain: Secondary | ICD-10-CM

## 2016-10-30 NOTE — Progress Notes (Signed)
   William Underwood is a 66 y.o. male who is here for lower R abdominal pain.   HPI:  [name] reports 48yrs of intermittent lower R abdominal apin, extending down towards hip flexors. Pain has been getting worse for the last 2-3 months, after he completed a country-wide bike ride and continues to be an avid long-distance runner. Describes pain as a sharp pain which is worse when lying on stomach, and with hip flexion (like climbing onto bike), crunches, and twisting motions. Denies a bulge at the site but says the area feels more 'gristly' compared to the other. No radiation of pain towards testicle. Is currently training for a marathon. No trt tried for current symptoms.  Social: retired from Administrator, arts non smoker  ROS: No GI or urinary symptoms.   Physical Exam:  BP 126/70   Ht 5\' 7"  (1.702 m)   Wt 140 lb (63.5 kg)   BMI 21.93 kg/m   Gen: NAD, resting comfortably MSK: TTP at lower R abdomen, near pubic symphysis and conjoint tendon  roughly a 3inch section, mild edema vs. soft tissue change compared to L at this site, but no discrete mass or overlying skin changes. No femoral or indirect hernia   Increased pain with hip flexion, sitting up, trunk rotation, and resisted adduction. Poor gluteus medius strength.  Ultrasound: Hypoechoic change between intersection of rectus abdominus and Abdominal obliques; no mass noted; hypoechoic change at insertion of adductors to pubic ramus.  Impression: likely some soft tissue separation and insertional tendinopathy of adductors  Assessment/Plan:  1) Sports Hernia- -Recommend pilates for strengthening and rehab of area, especially with abdominal, core, and adductor exercises  [place] is not overly accurate for diagnosis of this conditions but MRI less so. Not enough of an obvious defect to where surgical exploration is likely   Chronic conition so we can give conservative care a good try.  Follow-up in 2-3 months  I observed and examined the  patient with the resident and agree with assessment and plan.  Note reviewed and modified by me. Stefanie Libel, MD  10/30/16

## 2016-10-30 NOTE — Assessment & Plan Note (Signed)
Trial of rehab and conservative care  PT/ Pilates

## 2016-11-06 ENCOUNTER — Ambulatory Visit: Payer: PPO | Admitting: Sports Medicine

## 2017-01-29 ENCOUNTER — Encounter: Payer: Self-pay | Admitting: Sports Medicine

## 2017-01-29 ENCOUNTER — Ambulatory Visit (INDEPENDENT_AMBULATORY_CARE_PROVIDER_SITE_OTHER): Payer: PPO | Admitting: Sports Medicine

## 2017-01-29 ENCOUNTER — Ambulatory Visit: Payer: Self-pay

## 2017-01-29 VITALS — BP 118/62 | Ht 67.0 in | Wt 140.0 lb

## 2017-01-29 DIAGNOSIS — F411 Generalized anxiety disorder: Secondary | ICD-10-CM

## 2017-01-29 DIAGNOSIS — M25552 Pain in left hip: Secondary | ICD-10-CM

## 2017-01-29 DIAGNOSIS — S76019A Strain of muscle, fascia and tendon of unspecified hip, initial encounter: Secondary | ICD-10-CM | POA: Insufficient documentation

## 2017-01-29 DIAGNOSIS — S76012A Strain of muscle, fascia and tendon of left hip, initial encounter: Secondary | ICD-10-CM

## 2017-01-29 DIAGNOSIS — H8110 Benign paroxysmal vertigo, unspecified ear: Secondary | ICD-10-CM | POA: Diagnosis not present

## 2017-01-29 HISTORY — DX: Strain of muscle, fascia and tendon of unspecified hip, initial encounter: S76.019A

## 2017-01-29 MED ORDER — METHYLPREDNISOLONE ACETATE 40 MG/ML IJ SUSP
40.0000 mg | Freq: Once | INTRAMUSCULAR | Status: AC
  Administered 2017-01-29: 40 mg via INTRA_ARTICULAR

## 2017-01-29 MED ORDER — DIAZEPAM 5 MG PO TABS: 5.0000 mg | ORAL_TABLET | Freq: Every day | ORAL | 1 refills | Status: DC | PRN

## 2017-01-29 NOTE — Assessment & Plan Note (Addendum)
-   Edema vs hematoma 2/2 separation of L gluteus medius from insertion point at iliac crest -- dissecting into gluteus medius  Procedure:  Injection of left gluteus medius Consent obtained and verified. Time-out conducted. Noted no overlying erythema, induration, or other signs of local infection. Skin prepped in a sterile fashion. Topical analgesic spray: Ethyl chloride. Completed without difficulty.  Injection directed by [place] into the area of hypoechoic change between gluteus medius and minimus Meds: 1 cc Soumedrol 40 and 4 cc Lidocaine 1% Pain immediately improved suggesting accurate placement of the medication. Advised to call if fevers/chills, erythema, induration, drainage, or persistent bleeding.

## 2017-01-29 NOTE — Progress Notes (Signed)
[facility] Progress Note  Subjective:  William Underwood is a 66 y.o. male with history of vertigo and recent OV for R-sided sports hernia who presents for acute L hip pain. Pain began about 2 weeks ago. Patient ran a 5-mile "beer run" that was a hilly course with lots of turns. The next day he went for a jog and had intense pain at his L hip near the anterior superior iliac spine that radiated to anterior lateral thigh and over greater trochanter. He took it easy for over a week then tried running this past Saturday, and pain returned about 3 miles into the run. He stopped running until this morning; he again had the same pain about 3 miles into the run. After pain begins, he has had to walk with a bit of a limp, but pain improves with rest. Able to bike w no pain.  Patient also reports return of vertiginous symptoms about 4-5 days ago. He notices it most lying down or changing head positions. He requests to see vestibular therapist Rudell Cobb who has helped him with this issue successfully in the past. Taking 1/2 a diazepam has helped with anxiety associated with symptoms in the past.   ROS: Of note, sports hernia pain improved but gets aggravated by leg raises and crunches.  No groin pain Vertigo can be triggered by postional change  Objective: Blood pressure 118/62, height 5\' 7"  (1.702 m), weight 140 lb (63.5 kg). Body mass index is 21.93 kg/m. Constitutional: Athletic appearing male, in NAD Pulmonary/Chest: No respiratory distress.  Musculoskeletal: TTP over L ASIC to anterolateral thigh, including L greater trochanter. FROM with hip inversion and eversion bilaterally.  Pain localizes most over Glut Medius and some pain at insertion of piriformis Neurological: AOx3, no focal deficits. Hip abduction strength 5/5 bilaterally but painful on R side.  Skin: Skin is warm and dry. No rash noted. No bruising.  Psychiatric: Normal mood and affect.  Vitals  reviewed  Ultrasound of Left Lateral Hip  Separation of insertion point of gluteus medius from iliac crest Hypoechoic fluid between gluteus medius and minimus muscles at fascial plane Piriformis muscle intact without edema but some hypoechoic change at insertion into greater trochanter No evidence of greater trochanteric bursitis  Impression: Findings consistent with gluteus medius syndrome  Assessment/Plan: Hip pain, left - Edema vs hematoma 2/2 separation of L gluteus medius from insertion point at iliac crest -- though intact - Injected cortizone into greatest area of edema between gluteus medius and minimus muscles; observed steroid entering fluid pocket on [place]; patient had some immediate improvement in pain  Strain of gluteus medius - Encouraged biking over running over next 4-6 weeks  Follow-up in 1 month for rescan with [place] if not better.  Olene Floss, MD [facility], PGY-2  I observed and examined the patient with the resident and agree with assessment and plan.  Note reviewed and modified by me. Stefanie Libel, MD

## 2017-01-29 NOTE — Assessment & Plan Note (Addendum)
-   Encouraged biking over running over next 4-6 weeks Ease back into running after 1 week if no severe pain but increase gradually Cont HEP for abduction strength  rescan 6 wks

## 2017-01-29 NOTE — Progress Notes (Signed)
Patient ID: William Underwood, male   DOB: [birth year], 66 y.o.   MRN: [id number]  See resident documentation. Ila Mcgill, MD

## 2017-01-30 ENCOUNTER — Ambulatory Visit: Payer: PPO | Attending: Internal Medicine | Admitting: Rehabilitative and Restorative Service Providers"

## 2017-01-30 DIAGNOSIS — R269 Unspecified abnormalities of gait and mobility: Secondary | ICD-10-CM | POA: Diagnosis not present

## 2017-01-30 DIAGNOSIS — H8111 Benign paroxysmal vertigo, right ear: Secondary | ICD-10-CM

## 2017-01-30 NOTE — Therapy (Signed)
[facility] [address] [place], [place], [address] Phone: [phone]   Fax:  [phone]  Physical Therapy Evaluation  Patient Details  Name: William Underwood MRN: [id number] Date of Birth: Aug 01, 1951 Referring Provider: Hector Shade, MD  Encounter Date: 01/30/2017      PT End of Session - 01/30/17 1619    Visit Number 1   Number of Visits 4   Date for PT Re-Evaluation 03/01/17   Authorization Type G code every 10th visit   PT Start Time 1405   PT Stop Time 1450   PT Time Calculation (min) 45 min   Activity Tolerance Patient tolerated treatment well   Behavior During Therapy [facility] for tasks assessed/performed      Past Medical History:  Diagnosis Date  . Prostatitis   . Vertigo     Past Surgical History:  Procedure Laterality Date  . ANKLE ARTHROSCOPY    . KNEE ARTHROSCOPY      There were no vitals filed for this visit.       Subjective Assessment - 01/30/17 1410    Subjective The patient reports return of vertigo 7-8 days ago.  He notes imbalance and room spinning when turning in bed.    Currently in Pain? Yes   Pain Score --  worse today in L hip due to injection yesterday.            [facility] PT Assessment - 01/30/17 1415      Assessment   Medical Diagnosis BPPV   Referring Provider Hector Shade, MD   Onset Date/Surgical Date --  last week   Prior Therapy known to our clinic from prior post concussion and BPPV     Precautions   Precautions None     Restrictions   Weight Bearing Restrictions No     Balance Screen   Has the patient fallen in the past 6 months No   Has the patient had a decrease in activity level because of a fear of falling?  No   Is the patient reluctant to leave their home because of a fear of falling?  No     Home Environment   Living Environment Private residence   Living Arrangements Spouse/significant other     Prior Function   Leisure patient is biking more      Observation/Other Assessments   Focus on Therapeutic Outcomes (FOTO)  40%   Other Surveys  Other Surveys   Dizziness Handicap Inventory [facility])  46%            Vestibular Assessment - 01/30/17 1416      Vestibular Assessment   General Observation Patient has antalgic gait pattern L hip.  Rolling to the left side provokes symptoms.      Symptom Behavior   Type of Dizziness Spinning   Frequency of Dizziness daily   Duration of Dizziness seconds   Aggravating Factors --  when turning in bed   Relieving Factors Head stationary  holds head still     Occulomotor Exam   Occulomotor Alignment Normal   Spontaneous Absent  viewed in room light   Gaze-induced Absent   Head shaking Horizontal Absent   Smooth Pursuits Intact  noted a couple of beats of nystagmus     Vestibulo-Occular Reflex   VOR 1 Head Only (x 1 viewing) Performing gaze x 1 adaptation in seated provokes an "unsteady" sensation.   Comment head impulse test=small amplitude correction with right side, intact on left.  Positional Testing   Dix-Hallpike Dix-Hallpike Right;Dix-Hallpike Left   Horizontal Canal Testing Horizontal Canal Right;Horizontal Canal Left     Dix-Hallpike Right   Dix-Hallpike Right Duration trace noted   Dix-Hallpike Right Symptoms No nystagmus     Dix-Hallpike Left   Dix-Hallpike Left Duration trace symptom like it could come on   Dix-Hallpike Left Symptoms No nystagmus     Horizontal Canal Right   Horizontal Canal Right Duration none   Horizontal Canal Right Symptoms Normal     Horizontal Canal Left   Horizontal Canal Left Duration persistent >60 seconds   Horizontal Canal Left Symptoms Ageotrophic                Vestibular Treatment/Exercise - 01/30/17 1435      Vestibular Treatment/Exercise   Vestibular Treatment Provided Canalith Repositioning   Canalith Repositioning Canal Roll Right  Kim, 2011 maneuver with vibration     Canal Roll Right   Number of Reps   1  Performed Kim maneuver and then performed BBQ roll for horiz   Overall Response  Improved Symptoms   Response Details  Kim, 2011- used vibration on first and fourth position;  nystagmus changed direction to geotropic indicating conversion of cupulolithiasis to canalithiasis.  Followed up with horizontal canal repositioning/BBQ roll.                     PT Long Term Goals - 01/30/17 1620      PT LONG TERM GOAL #1   Title The patient will have negative positional testing to indicate resolution of BPPV.   Baseline Target date 03/01/2017   Time 4   Period Weeks     PT LONG TERM GOAL #2   Title The patient will improve DHI from 46% to < or equal to 24% to demo improving self perception of BPPV.   Baseline Target date 03/01/17   Time 4   Period Weeks     PT LONG TERM GOAL #3   Title The patient will report no sensation of imbalance with transitional movements sit>walking.   Baseline Target date 03/01/17   Time 4   Period Weeks     PT LONG TERM GOAL #4   Title The patient will be able to bend forward to look under objects without reports of vertigo   Baseline Target date 03/01/17   Time 4   Period Weeks               Plan - 01/30/17 1712    Clinical Impression Statement The patient is a 66 yo male known to our clinic from h/o recurrent BPPV.  He presents today with R cupulolithiasis per positional testing and responded well to repositioning maneuver to convert it to canlithiasis and then reposition otoconia.  PT to reassess next visit and treat, as indicated.   Rehab Potential Good   PT Frequency 1x / week   PT Duration 4 weeks   PT Treatment/Interventions ADLs/Self Care Home Management;Canalith Repostioning;Vestibular;Neuromuscular re-education;Gait training;Patient/family education;Therapeutic activities;Therapeutic exercise   PT Next Visit Plan Reassess BPPV and treat as indicated.   Consulted and Agree with Plan of Care Patient      Patient will benefit from  skilled therapeutic intervention in order to improve the following deficits and impairments:  Dizziness, Difficulty walking  Visit Diagnosis: BPPV (benign paroxysmal positional vertigo), right     Problem List Patient Active Problem List   Diagnosis Date Noted  . Strain of gluteus medius 01/29/2017  .  Pain of left heel 02/22/2015  . Sacroiliac joint dysfunction of left side 04/05/2014  . Hip pain, left 02/23/2014  . Ankle pain 05/19/2012  . Left ankle sprain 05/19/2012  . BPPV (benign paroxysmal positional vertigo) 07/01/2011  . Concussion syndrome 05/15/2011  . DEGENERATIVE JOINT DISEASE, HANDS 10/16/2009  . SHOULDER PAIN, RIGHT 08/04/2008  . Inguinal pain, right 08/04/2008  . NEUROPATHY 03/01/2008  . SYMPTOM, PAIN, ABDOMINAL, RIGHT UP QUADRANT 12/31/2006  . INJURY NOS, HIP/THIGH 12/31/2006    Kristapher Dubuque, PT 01/30/2017, 5:15 PM  [facility] [address] [place], [place], [address] Phone: [phone]   Fax:  [phone]  Name: William Underwood MRN: [id number] Date of Birth: 1951/08/31

## 2017-02-06 ENCOUNTER — Ambulatory Visit: Payer: PPO | Admitting: Rehabilitative and Restorative Service Providers"

## 2017-02-06 DIAGNOSIS — H8111 Benign paroxysmal vertigo, right ear: Secondary | ICD-10-CM | POA: Diagnosis not present

## 2017-02-06 DIAGNOSIS — R269 Unspecified abnormalities of gait and mobility: Secondary | ICD-10-CM

## 2017-02-06 NOTE — Therapy (Signed)
[facility] [address] [place], [place], [address] Phone: [phone]   Fax:  [phone]  Physical Therapy Treatment  Patient Details  Name: William Underwood MRN: [id number] Date of Birth: [birth year] Referring Provider: Hector Shade, MD  Encounter Date: 02/06/2017      PT End of Session - 02/06/17 1607    Visit Number 2   Number of Visits 4   Date for PT Re-Evaluation 03/01/17   Authorization Type G code every 10th visit   PT Start Time 1450   PT Stop Time 1530   PT Time Calculation (min) 40 min   Activity Tolerance Patient tolerated treatment well   Behavior During Therapy [facility] for tasks assessed/performed      Past Medical History:  Diagnosis Date  . Prostatitis   . Vertigo     Past Surgical History:  Procedure Laterality Date  . ANKLE ARTHROSCOPY    . KNEE ARTHROSCOPY      There were no vitals filed for this visit.      Subjective Assessment - 02/06/17 1454    Subjective The patient notes that his symptoms improved for 2 days after last treatment, then returned.  He is not having room spinning, but noted on Tuesday a shift in environment and felt unsteady.  He tried to reproduce our treatment at home and felt that it helped significantly, but it is not clearing.    Patient Stated Goals Reduce vertigo   Currently in Pain? Yes  PT not addressing hip pain--MD managing                Vestibular Assessment - 02/06/17 1458      Vestibular Assessment   General Observation Patient ambulates indep into clinic.     Positional Testing   Dix-Hallpike Dix-Hallpike Right;Dix-Hallpike Left   Horizontal Canal Testing Horizontal Canal Right;Horizontal Canal Left     Dix-Hallpike Right   Dix-Hallpike Right Duration 10 seconds   Dix-Hallpike Right Symptoms Upbeat, right rotatory nystagmus     Dix-Hallpike Left   Dix-Hallpike Left Duration mild sensation of wanting to begin   Dix-Hallpike Left  Symptoms No nystagmus     Horizontal Canal Right   Horizontal Canal Right Duration none   Horizontal Canal Right Symptoms Normal     Horizontal Canal Left   Horizontal Canal Left Duration none   Horizontal Canal Left Symptoms Normal                  Vestibular Treatment/Exercise - 02/06/17 1501      Vestibular Treatment/Exercise   Vestibular Treatment Provided Canalith Repositioning;Habituation   Canalith Repositioning Epley Manuever Right;Canal Roll Right;Comment   Habituation Exercises Horizontal Roll      EPLEY MANUEVER RIGHT   Number of Reps  1   Overall Response --  patient's nystagmus changed direction to horizontal canal     Canal Roll Right   Number of Reps  3   Overall Response  Improved Symptoms   Response Details  patient demo'd geotropic nystagmus with horizontal roll test to the right side.     OTHER   Comment Performed Gufoni (sidelying onto the left side x 2 minutes then nose down x 2 minutes) x 1 rep as symptoms not clearing with canal roll right     Horizontal Roll   Number of Reps  2   Symptom Description  symptoms resolved on second repetition  PT Education - 02/06/17 1608    Education provided Yes   Education Details handout on self treatment of multi-canal BPPV   Person(s) Educated Patient   Methods Explanation;Handout   Comprehension Verbalized understanding             PT Long Term Goals - 01/30/17 1620      PT LONG TERM GOAL #1   Title The patient will have negative positional testing to indicate resolution of BPPV.   Baseline Target date 03/01/2017   Time 4   Period Weeks     PT LONG TERM GOAL #2   Title The patient will improve DHI from 46% to < or equal to 24% to demo improving self perception of BPPV.   Baseline Target date 03/01/17   Time 4   Period Weeks     PT LONG TERM GOAL #3   Title The patient will report no sensation of imbalance with transitional movements sit>walking.   Baseline Target  date 03/01/17   Time 4   Period Weeks     PT LONG TERM GOAL #4   Title The patient will be able to bend forward to look under objects without reports of vertigo   Baseline Target date 03/01/17   Time 4   Period Weeks               Plan - 02/06/17 1607    Clinical Impression Statement The patient initially presented with R posterior canal BPPV and after Epley's maneuver, had converted to R horizontal canal BPPV.  Patient did not respond to initial treatment, but after demo'ing habituation, nystagmus were resolved.  Will reassess and tread as indicated.    PT Treatment/Interventions ADLs/Self Care Home Management;Canalith Repostioning;Vestibular;Neuromuscular re-education;Gait training;Patient/family education;Therapeutic activities;Therapeutic exercise   PT Next Visit Plan Reassess BPPV and treat as indicated.   Consulted and Agree with Plan of Care Patient      Patient will benefit from skilled therapeutic intervention in order to improve the following deficits and impairments:  Dizziness, Difficulty walking  Visit Diagnosis: BPPV (benign paroxysmal positional vertigo), right  Abnormality of gait     Problem List Patient Active Problem List   Diagnosis Date Noted  . Strain of gluteus medius 01/29/2017  . Pain of left heel 02/22/2015  . Sacroiliac joint dysfunction of left side 04/05/2014  . Hip pain, left 02/23/2014  . Ankle pain 05/19/2012  . Left ankle sprain 05/19/2012  . BPPV (benign paroxysmal positional vertigo) 07/01/2011  . Concussion syndrome 05/15/2011  . DEGENERATIVE JOINT DISEASE, HANDS 10/16/2009  . SHOULDER PAIN, RIGHT 08/04/2008  . Inguinal pain, right 08/04/2008  . NEUROPATHY 03/01/2008  . SYMPTOM, PAIN, ABDOMINAL, RIGHT UP QUADRANT 12/31/2006  . INJURY NOS, HIP/THIGH 12/31/2006    Bhavya Grand, PT 02/06/2017, 4:10 PM  [facility] [address] [place], [place], [address] Phone:  [phone]   Fax:  [phone]  Name: William Underwood MRN: [id number] Date of Birth: 07-23-1951

## 2017-02-06 NOTE — Patient Instructions (Signed)
Habituation - Tip Card  1.The goal of habituation training is to assist in decreasing symptoms of vertigo, dizziness, or nausea provoked by specific head and body motions. 2.These exercises may initially increase symptoms; however, be persistent and work through symptoms. With repetition and time, the exercises will assist in reducing or eliminating symptoms. 3.Exercises should be stopped and discussed with the therapist if you experience any of the following: - Sudden change or fluctuation in hearing - New onset of ringing in the ears, or increase in current intensity - Any fluid discharge from the ear - Severe pain in neck or back - Extreme nausea  Copyright  VHI. All rights reserved.   Habituation - Rolling  (do this when room moves up/down)   With pillow under head, start on back. Roll to your right side.  Hold until dizziness stops, plus 20 seconds and then roll to the left side.  Hold until dizziness stops, plus 20 seconds.  Repeat sequence 5 times per session. Do 2 sessions per day.  Copyright  VHI. All rights reserved.   Habituation - Sit to Side-Lying (do this when there is a rotational movement in the room)   Sit on edge of bed. Lie down onto the right side and hold until dizziness stops, plus 20 seconds.  Return to sitting and wait until dizziness stops, plus 20 seconds.  Repeat to the left side. Repeat sequence 5 times per session. Do 2 sessions per day.  Copyright  VHI. All rights reserved.

## 2017-02-09 DIAGNOSIS — M25552 Pain in left hip: Secondary | ICD-10-CM | POA: Diagnosis not present

## 2017-02-11 DIAGNOSIS — M25552 Pain in left hip: Secondary | ICD-10-CM | POA: Diagnosis not present

## 2017-02-11 DIAGNOSIS — M6281 Muscle weakness (generalized): Secondary | ICD-10-CM | POA: Diagnosis not present

## 2017-02-11 DIAGNOSIS — M7062 Trochanteric bursitis, left hip: Secondary | ICD-10-CM | POA: Diagnosis not present

## 2017-02-13 ENCOUNTER — Encounter: Payer: PPO | Admitting: Rehabilitative and Restorative Service Providers"

## 2017-02-16 DIAGNOSIS — M6281 Muscle weakness (generalized): Secondary | ICD-10-CM | POA: Diagnosis not present

## 2017-02-16 DIAGNOSIS — M7062 Trochanteric bursitis, left hip: Secondary | ICD-10-CM | POA: Diagnosis not present

## 2017-02-16 DIAGNOSIS — M25552 Pain in left hip: Secondary | ICD-10-CM | POA: Diagnosis not present

## 2017-02-18 DIAGNOSIS — M6281 Muscle weakness (generalized): Secondary | ICD-10-CM | POA: Diagnosis not present

## 2017-02-18 DIAGNOSIS — M25552 Pain in left hip: Secondary | ICD-10-CM | POA: Diagnosis not present

## 2017-02-18 DIAGNOSIS — M7062 Trochanteric bursitis, left hip: Secondary | ICD-10-CM | POA: Diagnosis not present

## 2017-02-24 DIAGNOSIS — M7062 Trochanteric bursitis, left hip: Secondary | ICD-10-CM | POA: Diagnosis not present

## 2017-02-24 DIAGNOSIS — M6281 Muscle weakness (generalized): Secondary | ICD-10-CM | POA: Diagnosis not present

## 2017-02-24 DIAGNOSIS — M25552 Pain in left hip: Secondary | ICD-10-CM | POA: Diagnosis not present

## 2017-02-26 ENCOUNTER — Ambulatory Visit: Payer: PPO | Admitting: Sports Medicine

## 2017-03-02 DIAGNOSIS — M25552 Pain in left hip: Secondary | ICD-10-CM | POA: Diagnosis not present

## 2017-03-02 DIAGNOSIS — M7062 Trochanteric bursitis, left hip: Secondary | ICD-10-CM | POA: Diagnosis not present

## 2017-03-02 DIAGNOSIS — M6281 Muscle weakness (generalized): Secondary | ICD-10-CM | POA: Diagnosis not present

## 2017-03-05 DIAGNOSIS — M25552 Pain in left hip: Secondary | ICD-10-CM | POA: Diagnosis not present

## 2017-03-05 DIAGNOSIS — M7062 Trochanteric bursitis, left hip: Secondary | ICD-10-CM | POA: Diagnosis not present

## 2017-03-05 DIAGNOSIS — M6281 Muscle weakness (generalized): Secondary | ICD-10-CM | POA: Diagnosis not present

## 2017-03-12 DIAGNOSIS — M25552 Pain in left hip: Secondary | ICD-10-CM | POA: Diagnosis not present

## 2017-03-18 DIAGNOSIS — M25552 Pain in left hip: Secondary | ICD-10-CM | POA: Diagnosis not present

## 2017-03-20 DIAGNOSIS — M25552 Pain in left hip: Secondary | ICD-10-CM | POA: Diagnosis not present

## 2017-03-30 ENCOUNTER — Ambulatory Visit (INDEPENDENT_AMBULATORY_CARE_PROVIDER_SITE_OTHER): Payer: PPO | Admitting: Sports Medicine

## 2017-03-30 ENCOUNTER — Encounter: Payer: Self-pay | Admitting: Sports Medicine

## 2017-03-30 VITALS — BP 130/82 | HR 51 | Ht 67.0 in | Wt 143.4 lb

## 2017-03-30 DIAGNOSIS — S76012A Strain of muscle, fascia and tendon of left hip, initial encounter: Secondary | ICD-10-CM

## 2017-03-30 DIAGNOSIS — M25552 Pain in left hip: Secondary | ICD-10-CM | POA: Diagnosis not present

## 2017-03-30 MED ORDER — NITROGLYCERIN 0.2 MG/HR TD PT24: MEDICATED_PATCH | TRANSDERMAL | 1 refills | Status: DC

## 2017-03-30 NOTE — Progress Notes (Signed)
OFFICE VISIT NOTE William Underwood. William Underwood, William Underwood at [place]  William Underwood - 66 y.o. male MRN [id number]  Date of birth: Feb 23, 1951  Visit Date: 03/30/2017  PCP: William Hatchet, MD   Referred by: William Hatchet, MD  Burlene Arnt, CMA acting as scribe for Dr. [name].  SUBJECTIVE:   Chief Complaint  Patient presents with  . left hip injury   HPI: As below and per problem based documentation when appropriate.  Pt presents today with complaint of left hip injury.  Pain started the end of March 2018. Pt was doing a 5 mile race, the "beer run" and reports that he felt fine when he was doing the race but the next day had a lot of pain in the left hip. Pt doesn't recall any incident during the run that would have caused the pain. Pt reports that he has been running for years and has hx of SI joint pain.   The pain is described as stabbing pain and is rated as 9/10 when at its worst.  Worsened with running and walking. He reports that running is worse than walking. He is able to go biking with no pain.  Improves with resting Therapies tried include : steroid injection, pt got no relief with this.   Other associated symptoms include: Pt reports pain in lower back and gluteal muscle.   Pt had MRI of the hip done 1-2 weeks ago, ordered by Dr. Rhona Underwood.   Pt denies fever, chills, night sweats, unintentional weight loss or weight gain.     Review of Systems  Constitutional: Negative for chills and fever.  Respiratory: Negative for shortness of breath and wheezing.   Cardiovascular: Negative for chest pain, palpitations and leg swelling.  Musculoskeletal: Positive for back pain and joint pain. Negative for falls.  Neurological: Negative for dizziness, tingling and headaches.  Endo/Heme/Allergies: Does not bruise/bleed easily.    Otherwise per HPI.  HISTORY & PERTINENT PRIOR DATA:  No specialty comments available. He  reports that he has never smoked. He has never used smokeless tobacco. No results for input(s): HGBA1C, LABURIC in the last 8760 hours. Medications & Allergies reviewed per EMR Patient Active Problem List   Diagnosis Date Noted  . Strain of gluteus medius 01/29/2017  . Pain of left heel 02/22/2015  . Sacroiliac joint dysfunction of left side 04/05/2014  . Hip pain, left 02/23/2014  . Ankle pain 05/19/2012  . Left ankle sprain 05/19/2012  . BPPV (benign paroxysmal positional vertigo) 07/01/2011  . Concussion syndrome 05/15/2011  . DEGENERATIVE JOINT DISEASE, HANDS 10/16/2009  . SHOULDER PAIN, RIGHT 08/04/2008  . Inguinal pain, right 08/04/2008  . NEUROPATHY 03/01/2008  . SYMPTOM, PAIN, ABDOMINAL, RIGHT UP QUADRANT 12/31/2006  . INJURY NOS, HIP/THIGH 12/31/2006   Past Medical History:  Diagnosis Date  . Prostatitis   . Vertigo    Family History  Problem Relation Age of Onset  . Hearing loss Father   . Colon cancer Neg Hx    Past Surgical History:  Procedure Laterality Date  . ANKLE ARTHROSCOPY    . KNEE ARTHROSCOPY     Social History   Occupational History  . Not on file.   Social History Main Topics  . Smoking status: Never Smoker  . Smokeless tobacco: Never Used  . Alcohol use No  . Drug use: No  . Sexual activity: Not on file    OBJECTIVE:  VS:  HT:5\' 7"  (170.2 cm)  WT:143 lb 6.4 oz (65 kg)  BMI:22.5    BP:130/82  HR:(!) 51bpm  TEMP: ( )  RESP:97 % EXAM: Findings:  WDWN, NAD, Non-toxic appearing Alert & appropriately interactive Not depressed or anxious appearing No increased work of breathing. Pupils are equal. EOM intact without nystagmus No clubbing or cyanosis of the extremities appreciated No significant rashes/lesions/ulcerations overlying the examined area. DP & PT pulses 2+/4.  No significant pretibial edema. Sensation intact to light touch in lower extremities.  Left hip and leg: Overall well aligned.  Minimal glute muscle mass.  He has  marked pain at the origin of the gluten medius on the left just inferior to the iliac crest.  Additional TTP over the tensor fascia lata this is described more as a discomfort than pain.  Hip abduction testing is markedly predominant tensor fossa lata recruitment pattern with minimal glute medius activation.  With resistance she has marked pain reproduces the majority of her symptoms with his hip extended while side-lying.  He is markedly weak doing this and is weak as well on contralateral side.  He has great internal and external rotation of the hip joints.  Negative logroll FADIR and Faber testing.  MRI reviewed from [facility] of his left hip on 03/18/2017: Read as normal. No significant intra-articular process.  On reviewing the coronal images there does appear to be a slight disruption on the T1 series with marked fat atrophy of bilateral glute medius tendons and a apparent avulsion off of the iliac spine of the left glute medius.  He has marked hypertrophy of the tensor fascia lata.      No results found. ASSESSMENT & PLAN:   Problem List Items Addressed This Visit    Hip pain, left - Primary   Strain of gluteus medius    Patient has had issues with his glute medius previously.  Based on his MRI findings and lack of muscle mass I suspect he has chronic deconditioning of the glute medius he likely tore during the run back in March given the undulating and side-to-side motions he was having to do with this atypical race.  The fact that he does not have any pain while riding his bike also suggests chronic deconditioning from neuromuscular inhibition.>50% of this 45 minute visit spent in direct patient counseling and/or coordination of care.  Discussion was focused on education regarding the in discussing the pathoetiology and anticipated clinical course of the above condition.  We reviewed the MRI in detail as well as discussed the past interventions including injection with Dr. Oneida Alar that he reports  seemingly worsened the condition.  Ultimately we discussed multiple options including biologic therapy and he would like to begin with nitroglycerin protocol.  I would like for him to avoid running until we have some improvement in his discomfort and can verify that he has begun to heal this appropriately.  It is appropriate for him to continue with cycling as he does not have any pain.  We will plan to see him back as scheduled in 4-6 weeks when he returns from [employer].  If any lack of improvement can consider PRP and this was discussed with him including the recovery time. Glute medius recruitment exercises with eccentric loading emphasized with patient today as well as information regarding foundations training which he would significantly benefit from.   +++++++++++++++++++++++++++++++++++++++++++++++++++++++++++++++ PROCEDURE NOTE: THERAPEUTIC EXERCISES (97110) 15 minutes spent for Therapeutic exercises as stated in above notes.  This included exercises focusing on stretching, strengthening, with  significant focus on eccentric aspects.   Proper technique shown and discussed handout in great detail with ATC.  All questions were discussed and answered.           Follow-up: Return in about 6 weeks (around 05/11/2017).   CMA/ATC served as Education administrator during this visit. History, Physical, and Plan performed by medical provider. Documentation and orders reviewed and attested to.      Teresa Coombs, [place] [facility] Physician

## 2017-03-30 NOTE — Patient Instructions (Addendum)
Please perform the exercise program that Jeneen Rinks has prepared for you and gone over in detail on a daily basis.  In addition to the handout you were provided you can access your program through: [email or web address]   Your unique program code is: [email or web address]   Also check out the YouTube Video from Dr. Minerva Ends.   "Powerful Posture and Pain Relief: 12 minutes of Foundation Training" - [email or web address]://youtu.[email or web address]  Nitroglycerin Protocol   Apply 1/4 nitroglycerin patch to affected area daily.  Change position of patch within the affected area every 24 hours.  You may experience a headache during the first 1-2 weeks of using the patch, these should subside.  If you experience headaches after beginning nitroglycerin patch treatment, you may take your preferred over the counter pain reliever.  Another side effect of the nitroglycerin patch is skin irritation or rash related to patch adhesive.  Please notify our office if you develop more severe headaches or rash, and stop the patch.  Tendon healing with nitroglycerin patch may require 12 to 24 weeks depending on the extent of injury.  Men should not use if taking Viagra, Cialis, or Levitra.   Do not use if you have migraines or rosacea.

## 2017-03-31 NOTE — Assessment & Plan Note (Signed)
Patient has had issues with his glute medius previously.  Based on his MRI findings and lack of muscle mass I suspect he has chronic deconditioning of the glute medius he likely tore during the run back in March given the undulating and side-to-side motions he was having to do with this atypical race.  The fact that he does not have any pain while riding his bike also suggests chronic deconditioning from neuromuscular inhibition.>50% of this 45 minute visit spent in direct patient counseling and/or coordination of care.  Discussion was focused on education regarding the in discussing the pathoetiology and anticipated clinical course of the above condition.  We reviewed the MRI in detail as well as discussed the past interventions including injection with Dr. Oneida Alar that he reports seemingly worsened the condition.  Ultimately we discussed multiple options including biologic therapy and he would like to begin with nitroglycerin protocol.  I would like for him to avoid running until we have some improvement in his discomfort and can verify that he has begun to heal this appropriately.  It is appropriate for him to continue with cycling as he does not have any pain.  We will plan to see him back as scheduled in 4-6 weeks when he returns from [employer].  If any lack of improvement can consider PRP and this was discussed with him including the recovery time. Glute medius recruitment exercises with eccentric loading emphasized with patient today as well as information regarding foundations training which he would significantly benefit from.   +++++++++++++++++++++++++++++++++++++++++++++++++++++++++++++++ PROCEDURE NOTE: THERAPEUTIC EXERCISES (97110) 15 minutes spent for Therapeutic exercises as stated in above notes.  This included exercises focusing on stretching, strengthening, with significant focus on eccentric aspects.   Proper technique shown and discussed handout in great detail with ATC.  All  questions were discussed and answered.

## 2017-04-14 DIAGNOSIS — M25552 Pain in left hip: Secondary | ICD-10-CM | POA: Diagnosis not present

## 2017-04-14 DIAGNOSIS — Z6823 Body mass index (BMI) 23.0-23.9, adult: Secondary | ICD-10-CM | POA: Diagnosis not present

## 2017-04-14 DIAGNOSIS — D179 Benign lipomatous neoplasm, unspecified: Secondary | ICD-10-CM | POA: Diagnosis not present

## 2017-04-15 ENCOUNTER — Encounter: Payer: Self-pay | Admitting: Rehabilitative and Restorative Service Providers"

## 2017-04-15 NOTE — Therapy (Signed)
[facility] [address] [place], [place], [address] Phone: [phone]   Fax:  [phone]  Patient Details  Name: William Underwood MRN: [id number] Date of Birth: 1950-12-07 Referring Provider:  No ref. provider found  Encounter Date: last encounter 02/06/17  PHYSICAL THERAPY DISCHARGE SUMMARY  Visits from Start of Care: 2  Current functional level related to goals / functional outcomes: Goals not reassessed due to patient cancelling visits after 2nd treatment noting symptoms resolved.     Remaining deficits: Patient improved per phone message.   Education / Equipment: Home program  Plan: Patient agrees to discharge.  Patient goals were partially met. Patient is being discharged due to meeting the stated rehab goals.  ?????       The patient did not return for goals to be rechecked, but per phone message felt vertigo resolved.     Thank you for the referral of this patient. Rudell Cobb, MPT          [name] 04/15/2017, 8:54 AM  [facility] [address] [place], [place], [address] Phone: [phone]   Fax:  [phone]

## 2017-05-11 ENCOUNTER — Ambulatory Visit: Payer: PPO | Admitting: Sports Medicine

## 2017-06-01 ENCOUNTER — Encounter: Payer: Self-pay | Admitting: Sports Medicine

## 2017-06-01 ENCOUNTER — Ambulatory Visit (INDEPENDENT_AMBULATORY_CARE_PROVIDER_SITE_OTHER): Payer: PPO | Admitting: Sports Medicine

## 2017-06-01 ENCOUNTER — Ambulatory Visit (INDEPENDENT_AMBULATORY_CARE_PROVIDER_SITE_OTHER): Payer: PPO

## 2017-06-01 ENCOUNTER — Ambulatory Visit: Payer: Self-pay

## 2017-06-01 VITALS — BP 112/76 | HR 47 | Ht 67.0 in | Wt 145.4 lb

## 2017-06-01 DIAGNOSIS — M545 Low back pain: Secondary | ICD-10-CM

## 2017-06-01 DIAGNOSIS — S76012A Strain of muscle, fascia and tendon of left hip, initial encounter: Secondary | ICD-10-CM

## 2017-06-01 DIAGNOSIS — R252 Cramp and spasm: Secondary | ICD-10-CM | POA: Diagnosis not present

## 2017-06-01 DIAGNOSIS — M47816 Spondylosis without myelopathy or radiculopathy, lumbar region: Secondary | ICD-10-CM

## 2017-06-01 DIAGNOSIS — M533 Sacrococcygeal disorders, not elsewhere classified: Secondary | ICD-10-CM | POA: Diagnosis not present

## 2017-06-01 DIAGNOSIS — M5136 Other intervertebral disc degeneration, lumbar region: Secondary | ICD-10-CM | POA: Diagnosis not present

## 2017-06-01 DIAGNOSIS — M25552 Pain in left hip: Secondary | ICD-10-CM

## 2017-06-01 NOTE — Progress Notes (Signed)
OFFICE VISIT NOTE William Underwood. William Underwood, [place] [facility] [place]  William Underwood - 66 y.o. male MRN [id number]  Date of birth: [birth year]  Visit Date: 06/01/2017  PCP: [name], MD   Referred by: [name], MD  William Underwood, CMA acting as scribe for Dr. [name].  SUBJECTIVE:   Chief Complaint  Patient presents with  . Follow-up    gluteal strain   HPI: As below and per problem based documentation when appropriate.  William Underwood is an established patient presenting today in follow-up of a strain of gluteus medius of left lower extremity. He had u/s done 01/29/2017 and MRI of the hip 02/01/2016. He was last seen in the office 03/30/17 and advised to follow Nitrol Protocol and avoid running. He was advised OK to continue cycling as he was not having pain with that. He was also given foundation exercises.   Pt reports that he has been following Nitrol Protocol. He did stop running for 6 weeks but has started back and is running about every other day. He has a small amount of pain during and after running. He feels that the pain is more in his SI joint than his gluteal muscle. He feels like he is being stabbed while running. When running pain is about 8/10. Pain when sitting around or walking is about 4/10. Pain is also worse when lifting things or doing activities that require a twisting motion. He has been cycling a fair amount, "a couple hundred miles a week", he doesn't have much pain when cycling. He has been doing foundation exercises and he says that there a couple of them "that just kill" him but he has continued to do them 4-5 times per week. He has noticed that over the past couple of months he has been experiencing chronic fatigue. He has started noticing cramping on the inside of his thighs when cycling, this is new. He has increased his fluid intake and taking in more electrolytes. He has also tried drinking pickle juice to see  if that would alleviate his leg cramps but he did not get any relief. The cramps typically last the day he rides and into the night and he has a lot of soreness the next day.     Review of Systems  Constitutional: Positive for malaise/fatigue. Negative for chills and fever.  Respiratory: Negative for shortness of breath and wheezing.   Cardiovascular: Negative for chest pain, palpitations and leg swelling.  Musculoskeletal: Positive for joint pain. Negative for falls.  Neurological: Positive for tingling (tingling in toes and fingers after long cycle rides). Negative for dizziness and headaches.  Endo/Heme/Allergies: Does not bruise/bleed easily.    Otherwise per HPI.  HISTORY & PERTINENT PRIOR DATA:  No specialty comments available. He reports that he has never smoked. He has never used smokeless tobacco. No results for input(s): HGBA1C, LABURIC in the last 8760 hours. Medications & Allergies reviewed per EMR Patient Active Problem List   Diagnosis Date Noted  . Lumbar spondylosis 06/20/2017  . Diverticulosis 06/20/2017  . Hemorrhoids, external 06/20/2017  . Muscle cramping 06/20/2017  . Strain of gluteus medius 01/29/2017  . Benign prostatic hyperplasia without urinary obstruction 06/17/2014  . Right inguinal hernia 06/17/2014  . Sacroiliac joint dysfunction of left side 04/05/2014  . Hip pain, left 02/23/2014  . Chronic hyperkalemia 05/20/2012  . Prostatitis 05/20/2012  . Ankle pain 05/19/2012  . Left ankle sprain 05/19/2012  . BPPV (benign  paroxysmal positional vertigo) 07/01/2011  . Concussion syndrome 05/15/2011  . DEGENERATIVE JOINT DISEASE, HANDS 10/16/2009  . SHOULDER PAIN, RIGHT 08/04/2008  . Inguinal pain, right 08/04/2008  . NEUROPATHY 03/01/2008  . SYMPTOM, PAIN, ABDOMINAL, RIGHT UP QUADRANT 12/31/2006  . INJURY NOS, HIP/THIGH 12/31/2006   Past Medical History:  Diagnosis Date  . Prostatitis   . Vertigo    Family History  Problem Relation Age of Onset  .  Hearing loss Father   . Colon cancer Neg Hx    Past Surgical History:  Procedure Laterality Date  . ANKLE ARTHROSCOPY    . KNEE ARTHROSCOPY     Social History   Occupational History  . Not on file.   Social History Main Topics  . Smoking status: Never Smoker  . Smokeless tobacco: Never Used  . Alcohol use No  . Drug use: No  . Sexual activity: Not on file    OBJECTIVE:  VS:  HT:5\' 7"  (170.2 cm)   WT:145 lb 6.4 oz (66 kg)  BMI:22.8    BP:112/76  HR:(!) 47bpm  TEMP: ( )  RESP:95 % EXAM: Findings:  Adult, thin athletic male in no acute distress.  He is alert and appropriate.  His vital signs are reviewed.  His bilateral legs are quite muscular with no atrophy.  His sensation is intact to light touch.  He has no significant pain with straight leg raise but he is tighter on the left than he is on the right.  His hip flexion strength is phenomenal but he is in a hip flexor predominant position while standing.  While laying in the lateral decubitus position he has an anterior tilt to his pelvis the position of ease of approximately 25 degrees of hip flexion.  Hip abduction strength is improved however with isolation of the glute medius he continues to have weakness at 4 out of 5 and improved pain but pain that is still present.     Dg Lumbar Spine Complete W/bend  Result Date: 06/01/2017 CLINICAL DATA:  Low back pain. EXAM: LUMBAR SPINE - COMPLETE WITH BENDING VIEWS COMPARISON:  None. FINDINGS: There is vacuum disc at L5-S1 with moderate disc space narrowing. Mild disc space narrowing present at L4-5. Facet hypertrophy present throughout much of the lumbar spine. No evidence of listhesis. Lateral views in flexion and extension show no evidence of instability. No fractures or bony lesions identified. IMPRESSION: Moderate degenerative disc disease at L5-S1 and mild disc disease at L4-5. Facet hypertrophy without significant listhesis or evidence of instability with flexion or extension.  Electronically Signed   By: Aletta Edouard M.D.   On: 06/01/2017 16:50   ASSESSMENT & PLAN:     ICD-10-CM   1. Low back pain, unspecified back pain laterality, unspecified chronicity, with sciatica presence unspecified M54.5 DG Lumbar Spine Complete W/Bend    [place] LIMITED JOINT SPACE STRUCTURES LOW LEFT(NO LINKED CHARGES)  2. Strain of gluteus medius of left lower extremity, initial encounter S76.012A   3. Hip pain, left M25.552   4. Muscle cramping R25.2   5. Lumbar spondylosis M47.816   6. Sacroiliac joint dysfunction of left side M53.3   ================================================================= Strain of gluteus medius This does seem to be improving symptomatically with nitroglycerin protocol   In the therapeutic exercises.  Interestingly his symptoms have slightly evolved as outlined below.  Sacroiliac joint dysfunction of left side He has had long-standing issues with this likely secondary to iliopsoas tightness.  See other problems for further discussion.   Muscle  cramping He is having new onset muscle cramping with cycling.  This is likely coming from a neurogenic source especially given the degenerative changes appreciated on x-ray today.  I would like for him to continue with the therapeutic exercises as we have previously outlined as this is likely changing muscle firing and recruitment patterns that may be altering his mechanics but should ultimately alleviate many of the symptoms that he is been experiencing.  We discussed the options for medications versus continued conservative measures and he would like to proceed with exercise alone.  He will continue with the foundations training and advance from there.  We also discussed that muscle cramping is multifactorial but once again given the overall improvements that he has had from day-to-day activitiy and pain I would like to stay on the same path.  Low likelihood of vascular phenomenon but if any lack of improvement could  consider further vascular workup.  Lumbar spondylosis Degenerative changes the low lumbar spine.  No further intervention at this time =================================================================  Follow-up: Return in about 6 weeks (around 07/13/2017).   CMA/ATC served as Education administrator during this visit. History, Physical, and Plan performed by medical provider. Documentation and orders reviewed and attested to.      Teresa Coombs, [place] [facility] Physician

## 2017-06-20 DIAGNOSIS — M47816 Spondylosis without myelopathy or radiculopathy, lumbar region: Secondary | ICD-10-CM | POA: Insufficient documentation

## 2017-06-20 DIAGNOSIS — K5790 Diverticulosis of intestine, part unspecified, without perforation or abscess without bleeding: Secondary | ICD-10-CM | POA: Insufficient documentation

## 2017-06-20 DIAGNOSIS — K644 Residual hemorrhoidal skin tags: Secondary | ICD-10-CM | POA: Insufficient documentation

## 2017-06-20 DIAGNOSIS — R252 Cramp and spasm: Secondary | ICD-10-CM | POA: Insufficient documentation

## 2017-06-20 HISTORY — DX: Spondylosis without myelopathy or radiculopathy, lumbar region: M47.816

## 2017-06-20 HISTORY — DX: Diverticulosis of intestine, part unspecified, without perforation or abscess without bleeding: K57.90

## 2017-06-20 HISTORY — DX: Residual hemorrhoidal skin tags: K64.4

## 2017-06-20 NOTE — Assessment & Plan Note (Signed)
Degenerative changes the low lumbar spine.  No further intervention at this time

## 2017-06-20 NOTE — Assessment & Plan Note (Signed)
He has had long-standing issues with this likely secondary to iliopsoas tightness.  See other problems for further discussion.

## 2017-06-20 NOTE — Assessment & Plan Note (Addendum)
He is having new onset muscle cramping with cycling.  This is likely coming from a neurogenic source especially given the degenerative changes appreciated on x-ray today.  I would like for him to continue with the therapeutic exercises as we have previously outlined as this is likely changing muscle firing and recruitment patterns that may be altering his mechanics but should ultimately alleviate many of the symptoms that he is been experiencing.  We discussed the options for medications versus continued conservative measures and he would like to proceed with exercise alone.  He will continue with the foundations training and advance from there.  We also discussed that muscle cramping is multifactorial but once again given the overall improvements that he has had from day-to-day activitiy and pain I would like to stay on the same path.  Low likelihood of vascular phenomenon but if any lack of improvement could consider further vascular workup.

## 2017-06-20 NOTE — Assessment & Plan Note (Signed)
This does seem to be improving symptomatically with nitroglycerin protocol   In the therapeutic exercises.  Interestingly his symptoms have slightly evolved as outlined below.

## 2017-07-13 ENCOUNTER — Encounter: Payer: Self-pay | Admitting: Family Medicine

## 2017-07-13 ENCOUNTER — Encounter: Payer: Self-pay | Admitting: Sports Medicine

## 2017-07-13 ENCOUNTER — Ambulatory Visit (INDEPENDENT_AMBULATORY_CARE_PROVIDER_SITE_OTHER): Payer: PPO | Admitting: Sports Medicine

## 2017-07-13 ENCOUNTER — Ambulatory Visit (INDEPENDENT_AMBULATORY_CARE_PROVIDER_SITE_OTHER): Payer: PPO | Admitting: Family Medicine

## 2017-07-13 VITALS — BP 120/78 | HR 48 | Temp 98.4°F | Ht 67.0 in | Wt 145.0 lb

## 2017-07-13 VITALS — BP 120/78 | HR 48 | Ht 67.0 in | Wt 145.2 lb

## 2017-07-13 DIAGNOSIS — Z9189 Other specified personal risk factors, not elsewhere classified: Secondary | ICD-10-CM | POA: Diagnosis not present

## 2017-07-13 DIAGNOSIS — Z1159 Encounter for screening for other viral diseases: Secondary | ICD-10-CM | POA: Diagnosis not present

## 2017-07-13 DIAGNOSIS — M9902 Segmental and somatic dysfunction of thoracic region: Secondary | ICD-10-CM | POA: Diagnosis not present

## 2017-07-13 DIAGNOSIS — M47816 Spondylosis without myelopathy or radiculopathy, lumbar region: Secondary | ICD-10-CM | POA: Diagnosis not present

## 2017-07-13 DIAGNOSIS — Z0000 Encounter for general adult medical examination without abnormal findings: Secondary | ICD-10-CM

## 2017-07-13 DIAGNOSIS — M9901 Segmental and somatic dysfunction of cervical region: Secondary | ICD-10-CM | POA: Diagnosis not present

## 2017-07-13 DIAGNOSIS — M25552 Pain in left hip: Secondary | ICD-10-CM

## 2017-07-13 DIAGNOSIS — M9908 Segmental and somatic dysfunction of rib cage: Secondary | ICD-10-CM | POA: Diagnosis not present

## 2017-07-13 DIAGNOSIS — E559 Vitamin D deficiency, unspecified: Secondary | ICD-10-CM | POA: Diagnosis not present

## 2017-07-13 DIAGNOSIS — Z23 Encounter for immunization: Secondary | ICD-10-CM | POA: Diagnosis not present

## 2017-07-13 DIAGNOSIS — M25512 Pain in left shoulder: Secondary | ICD-10-CM

## 2017-07-13 DIAGNOSIS — S76012A Strain of muscle, fascia and tendon of left hip, initial encounter: Secondary | ICD-10-CM

## 2017-07-13 DIAGNOSIS — R252 Cramp and spasm: Secondary | ICD-10-CM | POA: Diagnosis not present

## 2017-07-13 DIAGNOSIS — Z13220 Encounter for screening for lipoid disorders: Secondary | ICD-10-CM

## 2017-07-13 DIAGNOSIS — M545 Low back pain: Secondary | ICD-10-CM

## 2017-07-13 NOTE — Progress Notes (Signed)
OFFICE VISIT NOTE William Underwood. Darcia Lampi, [facility]  DONZEL ROMACK - 66 y.o. male MRN [id number]  Date of birth: 1951-08-18  Visit Date: 07/13/2017  PCP: Briscoe Deutscher, DO   Referred by: [name], MD  Burlene Arnt, CMA acting as scribe for Dr. [name].  SUBJECTIVE:   Chief Complaint  Patient presents with  . Follow-up    strain of gluteus medius LLE, SI joint dysfunction, muscle cramping   HPI: As below and per problem based documentation when appropriate.  Mr. Dente is an established patient presenting today in follow-up of multiple issues. He has been following Nitro Protocol for strain of the gluteus medius LLE. He is also following up for lumbar spondylosis, LT SI joint dysfunction, and muscle cramping while cycling. He had xray L-spine 06/01/2017. He was provided with link for foundation training exercises.   Pt reports improvement of pain associated with muscle strain since using the Nitroglycerin patches. He is able to run now. He is still having SI joint pain, worse when running. He says that he is sore all the time but the pain significantly improves when he stops running. He has been doing Scientist, research (physical sciences) exercises with minimal trouble. He reports that he does these exercises 3-4 times weekly. He reports that muscle cramping during cycling has resolved. He has started taking salt pills and he has not any any trouble with muscle cramping since starting these.   Pt c/o LT shoulder pain that radiates into the neck and down into the arm. He has tingling sensation into the arm and stiff neck when he first wakes up. Pain has been present x several months. No recent xray of c-spine. He has hx of degenerative disc in c-spine. He has taken Tramadol for neck pain in the past with some relief.     Review of Systems  Constitutional: Negative for chills and fever.  Musculoskeletal: Positive for joint pain and  neck pain.  Neurological: Positive for dizziness (ongoing vertigo after concussion 6-7 years ago, seen by Rudell Cobb) and tingling. Negative for headaches.  Endo/Heme/Allergies: Does not bruise/bleed easily.    Otherwise per HPI.  HISTORY & PERTINENT PRIOR DATA:  No specialty comments available. He reports that he has never smoked. He has never used smokeless tobacco. No results for input(s): HGBA1C, LABURIC in the last 8760 hours. Medications & Allergies reviewed per EMR Patient Active Problem List   Diagnosis Date Noted  . Left shoulder pain 07/14/2017  . Lumbar spondylosis 06/20/2017  . Diverticulosis 06/20/2017  . Hemorrhoids, external 06/20/2017  . Muscle cramping 06/20/2017  . Strain of gluteus medius 01/29/2017  . Benign prostatic hyperplasia without urinary obstruction 06/17/2014  . Right inguinal hernia 06/17/2014  . Sacroiliac joint dysfunction of left side 04/05/2014  . Hip pain, left 02/23/2014  . Chronic hyperkalemia 05/20/2012  . Prostatitis 05/20/2012  . Ankle pain 05/19/2012  . Left ankle sprain 05/19/2012  . BPPV (benign paroxysmal positional vertigo) 07/01/2011  . Concussion syndrome 05/15/2011  . DEGENERATIVE JOINT DISEASE, HANDS 10/16/2009  . SHOULDER PAIN, RIGHT 08/04/2008  . Inguinal pain, right 08/04/2008  . NEUROPATHY 03/01/2008  . SYMPTOM, PAIN, ABDOMINAL, RIGHT UP QUADRANT 12/31/2006  . INJURY NOS, HIP/THIGH 12/31/2006   Past Medical History:  Diagnosis Date  . Prostatitis   . Vertigo    Family History  Problem Relation Age of Onset  . Hearing loss Father   . Colon cancer Neg Hx  Past Surgical History:  Procedure Laterality Date  . ANKLE ARTHROSCOPY    . KNEE ARTHROSCOPY     Social History   Occupational History  . Not on file.   Social History Main Topics  . Smoking status: Never Smoker  . Smokeless tobacco: Never Used  . Alcohol use No  . Drug use: No  . Sexual activity: Not on file    OBJECTIVE:  VS:  HT:5\' 7"  (170.2  cm)   WT:145 lb 3.2 oz (65.9 kg)  BMI:22.74    BP:120/78  HR:(!) 48bpm  TEMP: ( )  RESP:98 % EXAM: Findings:  WDWN, NAD, Non-toxic appearing Alert & appropriately interactive Not depressed or anxious appearing No increased work of breathing. Pupils are equal. EOM intact without nystagmus No clubbing or cyanosis of the extremities appreciated No significant rashes/lesions/ulcerations overlying the examined area. DP & PT pulses 2+/4.  No significant pretibial edema. Sensation intact to light touch in lower extremities.   Bilateral upper extremities overall well aligned.  upper extremity myotomes intact Overall good range of motion of the cervical spine Otherwise please see osteopathic manipulation note.     No results found. ASSESSMENT & PLAN:     ICD-10-CM   1. Low back pain, unspecified back pain laterality, unspecified chronicity, with sciatica presence unspecified M54.5 OSTEOPATHIC MANIPULATION TREATMENT  2. Strain of gluteus medius of left lower extremity, initial encounter S76.012A OSTEOPATHIC MANIPULATION TREATMENT  3. Hip pain, left M25.552 OSTEOPATHIC MANIPULATION TREATMENT  4. Lumbar spondylosis M47.816 OSTEOPATHIC MANIPULATION TREATMENT  5. Muscle cramping R25.2 OSTEOPATHIC MANIPULATION TREATMENT  6. Segmental and somatic dysfunction of cervical region M99.01 OSTEOPATHIC MANIPULATION TREATMENT  7. Segmental and somatic dysfunction of thoracic region M99.02 OSTEOPATHIC MANIPULATION TREATMENT  8. Segmental and somatic dysfunction of rib cage M99.08 OSTEOPATHIC MANIPULATION TREATMENT  9. Acute pain of left shoulder M25.512   ================================================================= Left shoulder pain Acute on chronic left shoulder pain consistent with scapular dyskinesis and posterior chain tightness.  Osteopathic manipulation performed today.  Recommend continuing with posterior chain exercises and return to Pilates.  Hip pain, left Overall significantly  better.  He is he has improved and we will plan to have him discontinue nitroglycerin if any recurrence of can continue the next 6-12 weeks for  Lumbar spondylosis Some associated low back and mid back pain.  Benefited from osteopathic manipulation.  Muscle cramping This has increased with postural stabilization and increasing intake while cycling.  ================================================================= Patient Instructions  Called Butch Penny to go back to Pilates PT.  Keep working on your exercises.  If your neck or back lock up again and you would like for me to repeat manipulation please call your convenience.  Follow-up in 8 weeks if you have to resume the nitroglycerin otherwise we will plan to see you just when you need [place]. =================================================================  Follow-up: Return if symptoms worsen or fail to improve.   CMA/ATC served as Education administrator during this visit. History, Physical, and Plan performed by medical provider. Documentation and orders reviewed and attested to.      Teresa Coombs, [facility] Physician

## 2017-07-13 NOTE — Procedures (Signed)
PROCEDURE NOTE : OSTEOPATHIC MANIPULATION The decision today to treat with Osteopathic Manipulative Therapy (OMT) was based on physical exam findings. Verbal consent was obtained after after explanation of risks, benefits and potential side effects, including acute pain flare, post manipulation soreness and need for repeat treatments.  Additional time was spent discussing the minimal risk of  injury to neurovascular structures for associated Cervical manipulation.  After verbal consent was obtained manipulation was performed as below:            Regions treated: Per examined regions as below and associated billing codes          Techniques used: Muscle Energy, MFR, HVLA and ART The patient tolerated the treatment well and reported Improved symptoms following treatment today. Patient was given medications, exercises, stretches and lifestyle modifications per AVS and verbally.     OSTEOPATHIC/STRUCTURAL EXAM FINDINGS:    C4 -C6 side bent left rotated right  C3 side bent right rotated right  T1 ERS left  T4 through T6 neutral rotated left, side bent right  Elevated left first rib

## 2017-07-13 NOTE — Patient Instructions (Signed)
Called Butch Penny to go back to Pilates PT.  Keep working on your exercises.  If your neck or back lock up again and you would like for me to repeat manipulation please call your convenience.  Follow-up in 8 weeks if you have to resume the nitroglycerin otherwise we will plan to see you just when you need [place].

## 2017-07-14 DIAGNOSIS — M25512 Pain in left shoulder: Secondary | ICD-10-CM | POA: Insufficient documentation

## 2017-07-14 NOTE — Assessment & Plan Note (Signed)
Some associated low back and mid back pain.  Benefited from osteopathic manipulation.

## 2017-07-14 NOTE — Assessment & Plan Note (Signed)
Acute on chronic left shoulder pain consistent with scapular dyskinesis and posterior chain tightness.  Osteopathic manipulation performed today.  Recommend continuing with posterior chain exercises and return to Pilates.

## 2017-07-14 NOTE — Assessment & Plan Note (Signed)
This has increased with postural stabilization and increasing intake while cycling.

## 2017-07-14 NOTE — Assessment & Plan Note (Signed)
Overall significantly better.  He is he has improved and we will plan to have him discontinue nitroglycerin if any recurrence of can continue the next 6-12 weeks for

## 2017-07-15 ENCOUNTER — Other Ambulatory Visit (INDEPENDENT_AMBULATORY_CARE_PROVIDER_SITE_OTHER): Payer: PPO

## 2017-07-15 ENCOUNTER — Telehealth: Payer: Self-pay

## 2017-07-15 DIAGNOSIS — E559 Vitamin D deficiency, unspecified: Secondary | ICD-10-CM | POA: Diagnosis not present

## 2017-07-15 DIAGNOSIS — R972 Elevated prostate specific antigen [PSA]: Secondary | ICD-10-CM

## 2017-07-15 DIAGNOSIS — Z13220 Encounter for screening for lipoid disorders: Secondary | ICD-10-CM

## 2017-07-15 DIAGNOSIS — Z0000 Encounter for general adult medical examination without abnormal findings: Secondary | ICD-10-CM | POA: Diagnosis not present

## 2017-07-15 LAB — COMPREHENSIVE METABOLIC PANEL
ALT: 14 U/L (ref 0–53)
AST: 20 U/L (ref 0–37)
Albumin: 3.8 g/dL (ref 3.5–5.2)
Alkaline Phosphatase: 42 U/L (ref 39–117)
BUN: 19 mg/dL (ref 6–23)
CO2: 29 mEq/L (ref 19–32)
Calcium: 9.3 mg/dL (ref 8.4–10.5)
Chloride: 105 mEq/L (ref 96–112)
Creatinine, Ser: 0.87 mg/dL (ref 0.40–1.50)
GFR: 93.11 mL/min (ref >60.00)
Glucose, Bld: 93 mg/dL (ref 70–99)
Potassium: 4.8 mEq/L (ref 3.5–5.1)
Sodium: 138 mEq/L (ref 135–145)
Total Bilirubin: 0.7 mg/dL (ref 0.2–1.2)
Total Protein: 6 g/dL (ref 6.0–8.3)

## 2017-07-15 LAB — LIPID PANEL
Cholesterol: 143 mg/dL (ref 0–200)
HDL: 60.8 mg/dL (ref >39.00)
LDL Cholesterol: 71 mg/dL (ref 0–99)
NonHDL: 81.8
Total CHOL/HDL Ratio: 2
Triglycerides: 55 mg/dL (ref 0.0–149.0)
VLDL: 11 mg/dL (ref 0.0–40.0)

## 2017-07-15 LAB — VITAMIN D 25 HYDROXY (VIT D DEFICIENCY, FRACTURES): VITD: 25.99 ng/mL — ABNORMAL LOW (ref 30.00–100.00)

## 2017-07-15 LAB — PSA: PSA: 1.74 ng/mL (ref 0.10–4.00)

## 2017-07-15 NOTE — Telephone Encounter (Signed)
Pt requested to have PSA checked during lab draw this morning. Please advise if okay to add on lab. Thank you.

## 2017-07-15 NOTE — Telephone Encounter (Signed)
I have placed the order for you.

## 2017-07-15 NOTE — Telephone Encounter (Signed)
Noted! Thank you

## 2017-07-16 NOTE — Progress Notes (Signed)
Subjective:    William Underwood is a 66 y.o. male and is here for a comprehensive physical exam.  HPI: He has no concerns today. He is very active, runs marathons regularly. Medical issues are generally musculoskeletal. He takes prn NSAIDs and Tramadol. Hx of post concussion syndrome, causes vertigo that requires Valium prn.   Health Maintenance Due  Topic Date Due  . Hepatitis C Screening  06-30-1951  . TETANUS/TDAP  10/24/1969  . PNA vac Low Risk Adult (2 of 2 - PCV13) 07/13/2017   PMHx, SurgHx, SocialHx, Medications, and Allergies were reviewed in the Visit Navigator and updated as appropriate.   Past Medical History:  Diagnosis Date  . Prostatitis   . Vertigo    Past Surgical History:  Procedure Laterality Date  . ANKLE ARTHROSCOPY    . KNEE ARTHROSCOPY     Family History  Problem Relation Age of Onset  . Hearing loss Father   . Colon cancer Neg Hx    Social History  Substance Use Topics  . Smoking status: Never Smoker  . Smokeless tobacco: Never Used  . Alcohol use No   Review of Systems:   Pertinent items are noted in the HPI. Otherwise, ROS is negative.  Objective:   Vitals:   07/13/17 1347  BP: 120/78  Pulse: (!) 48  Temp: 98.4 F (36.9 C)  SpO2: 98%   Body mass index is 22.71 kg/m.  General  Alert, cooperative, no distress, appears stated age  Head:  Normocephalic, without obvious abnormality, atraumatic  Eyes:  PERRL, conjunctiva/corneas clear, EOM's intact, fundi benign, both eyes       Ears:  Normal TM's and external ear canals, both ears  Nose: Nares normal, septum midline, mucosa normal, no drainage or sinus tenderness  Throat: Lips, mucosa, and tongue normal; teeth and gums normal  Neck: Supple, symmetrical, trachea midline, no adenopathy; thyroid: no enlargement/tenderness/nodules; no carotid bruit or JVD  Back:   Symmetric, no curvature, ROM normal, no CVA tenderness  Lungs:   Clear to auscultation bilaterally, respirations unlabored    Chest Wall:  No tenderness or deformity  Heart:  Regular rate and rhythm, S1 and S2 normal, no murmur, rub or gallop  Abdomen:   Soft, non-tender, bowel sounds active all four quadrants, no masses, no organomegaly  Extremities: Extremities normal, atraumatic, no cyanosis or edema  Prostate: Not done  Skin: Skin color, texture, turgor normal, no rashes or lesions  Lymph: Cervical, supraclavicular, and axillary nodes normal  Neurologic: CNII-XII grossly intact. Normal strength, sensation and reflexes throughout    AssessmentPlan:   William Underwood was seen today for establish care.  Diagnoses and all orders for this visit:  Routine physical examination -     Comprehensive metabolic panel; Future  Screening for lipid disorders -     Lipid panel; Future  Encounter for hepatitis C virus screening test for high risk patient  Vitamin D deficiency -     VITAMIN D 25 Hydroxy (Vit-D Deficiency, Fractures); Future  Encounter for immunization -     Flu vaccine HIGH DOSE PF  Patient Counseling: [x]   Nutrition: Stressed importance of moderation in sodium/caffeine intake, saturated fat and cholesterol, caloric balance, sufficient intake of fresh fruits, vegetables, and fiber  [x]   Stressed the importance of regular exercise.   []   Substance Abuse: Discussed cessation/primary prevention of tobacco, alcohol, or other drug use; driving or other dangerous activities under the influence; availability of treatment for abuse.   [x]   Injury prevention: Discussed  safety belts, safety helmets, smoke detector, smoking near bedding or upholstery.   []   Sexuality: Discussed sexually transmitted diseases, partner selection, use of condoms, avoidance of unintended pregnancy  and contraceptive alternatives.   [x]   Dental health: Discussed importance of regular tooth brushing, flossing, and dental visits.  [x]   Health maintenance and immunizations reviewed. Please refer to Health maintenance section.    Briscoe Deutscher, DO [place]

## 2017-07-17 ENCOUNTER — Telehealth: Payer: Self-pay | Admitting: Family Medicine

## 2017-07-17 NOTE — Telephone Encounter (Signed)
Please see result note.  Patient notified of results and recommendations.

## 2017-07-17 NOTE — Telephone Encounter (Signed)
Patient called in reference to missed phone call to discuss labs. Please call patient and advise.

## 2017-08-03 ENCOUNTER — Ambulatory Visit: Payer: Self-pay

## 2017-08-03 ENCOUNTER — Encounter: Payer: Self-pay | Admitting: Sports Medicine

## 2017-08-03 ENCOUNTER — Ambulatory Visit (INDEPENDENT_AMBULATORY_CARE_PROVIDER_SITE_OTHER): Payer: PPO | Admitting: Sports Medicine

## 2017-08-03 ENCOUNTER — Ambulatory Visit (INDEPENDENT_AMBULATORY_CARE_PROVIDER_SITE_OTHER): Payer: PPO

## 2017-08-03 VITALS — BP 120/72 | HR 49 | Ht 67.0 in | Wt 146.0 lb

## 2017-08-03 DIAGNOSIS — M25561 Pain in right knee: Secondary | ICD-10-CM

## 2017-08-03 DIAGNOSIS — M545 Low back pain: Secondary | ICD-10-CM | POA: Diagnosis not present

## 2017-08-03 DIAGNOSIS — M533 Sacrococcygeal disorders, not elsewhere classified: Secondary | ICD-10-CM | POA: Diagnosis not present

## 2017-08-03 MED ORDER — TRAMADOL HCL 50 MG PO TABS: 50.0000 mg | ORAL_TABLET | Freq: Four times a day (QID) | ORAL | 1 refills | Status: DC | PRN

## 2017-08-03 NOTE — Assessment & Plan Note (Signed)
Chronic pain, tramadol refill.

## 2017-08-03 NOTE — Patient Instructions (Addendum)
I recommend you obtained a compression sleeve to help with your joint problems. There are many options on the market however I recommend obtaining a knee Body Helix compression sleeve.  You can find information (including how to appropriate measure yourself for sizing) can be found at www.Body http://www.lambert.com/.  Many of these products are health savings account (HSA) eligible.   You can use the compression sleeve at any time throughout the day but is most important to use while being active as well as for 2 hours post-activity.   It is appropriate to ice following activity with the compression sleeve in place.   You had an injection today.  Things to be aware of after injection are listed below: . You may experience no significant improvement or even a slight worsening in your symptoms during the first 24 to 48 hours.  After that we expect your symptoms to improve gradually over the next 2 weeks for the medicine to have its maximal effect.  You should continue to have improvement out to 6 weeks after your injection. . Dr. [name] recommends icing the site of the injection for 20 minutes  1-2 times the day of your injection . You may shower but no swimming, tub bath or Jacuzzi for 24 hours. . If your bandage falls off this does not need to be replaced.  It is appropriate to remove the bandage after 4 hours. . You may resume light activities as tolerated unless otherwise directed per Dr. [name] during your visit  POSSIBLE STEROID SIDE EFFECTS:  Side effects from injectable steroids tend to be less than when taken orally however you may experience some of the symptoms listed below.  If experienced these should only last for a short period of time. Change in menstrual flow  Edema (swelling)  Increased appetite Skin flushing (redness)  Skin rash/acne  Thrush (oral) Yeast vaginitis    Increased sweating  Depression Increased blood glucose levels Cramping and leg/calf  Euphoria (feeling happy)  POSSIBLE PROCEDURE  SIDE EFFECTS: The side effects of the injection are usually fairly minimal however if you may experience some of the following side effects that are usually self-limited and will is off on their own.  If you are concerned please feel free to call the office with questions:  Increased numbness or tingling  Nausea or vomiting  Swelling or bruising at the injection site   Please call our office if if you experience any of the following symptoms over the next week as these can be signs of infection:   Fever greater than 100.51F  Significant swelling at the injection site  Significant redness or drainage from the injection site  If after 2 weeks you are continuing to have worsening symptoms please call our office to discuss what the next appropriate actions should be including the potential for a return office visit or other diagnostic testing.

## 2017-08-03 NOTE — Progress Notes (Signed)
OFFICE VISIT NOTE William Underwood. William Underwood, [facility]  William Underwood - 66 y.o. male MRN [id number]  Date of birth: 15-Mar-1951  Visit Date: 08/03/2017  PCP: Briscoe Deutscher, DO   Referred by: Briscoe Deutscher, DO  Burlene Arnt, CMA acting as scribe for Dr. [name].  SUBJECTIVE:   Chief Complaint  Patient presents with  . RT knee pain   HPI: As below and per problem based documentation when appropriate.  William Underwood is an established patient presenting today for evaluation of RT knee pain.  Pain has been present x 1 week.  Pain started after doing 70 mile bike ride and 15 mile run. Over this past weekend he did another long ride and run, he is training for a marathon. He has noticed some swelling around the knee. Pain is mostly on the medial aspect of the knee and radiates toward the posterior aspect of the knee.   The pain is described as aching while inactive and stabbing while running. Pain is rated as 5/10 while running. He says that he can run through the pain and the intensity of the pain varies depending on how his foot lands.   Worsened with inactivity. The knee seems to get stiff and the pain is worse when he first gets up and starts moving around. Pain is also worse when going up stairs.  Improves with activity. He does have some pain during activity but the intensity goes down after getting started.  Therapies tried include He has been using compression sleeve while being active. He has been icing the knee regularly and this seems to help with the swelling. He has been taking Aleve with minimal relief.   Other associated symptoms include: Pt denies radiation of pain into the upper or lower leg. He denies pain in the ankle or hip.   No recent xray of the knee. He has hx of arthroscopic surgery in 1990s.     Review of Systems  Constitutional: Negative for chills, fever and malaise/fatigue.  Respiratory: Negative  for shortness of breath and wheezing.   Cardiovascular: Negative for chest pain, palpitations and leg swelling.  Musculoskeletal: Positive for joint pain.  Neurological: Positive for dizziness. Negative for tingling, weakness and headaches.    Otherwise per HPI.  HISTORY & PERTINENT PRIOR DATA:  No specialty comments available. He reports that he has never smoked. He has never used smokeless tobacco. No results for input(s): HGBA1C, LABURIC in the last 8760 hours. Allergies reviewed per EMR Prior to Admission medications   Medication Sig Start Date End Date Taking? Authorizing Provider  Ascorbic Acid (VITAMIN C) 1000 MG tablet Take 1,000 mg by mouth daily.   Yes [provider]  Cyanocobalamin (B-12 PO) Take 1 tablet by mouth daily.   Yes [provider]  diazepam (VALIUM) 5 MG tablet Take 1 tablet (5 mg total) by mouth daily as needed for anxiety. 01/29/17  Yes Rogue Bussing, MD  Multiple Vitamins-Minerals (ZINC PO) Take 1 tablet by mouth daily.   Yes [provider]  nitroGLYCERIN (NITRODUR - DOSED IN MG/24 HR) 0.2 mg/hr patch Place 1/4 of patch over affected region. Remove and replace once daily.  Slightly alter skin placement daily 03/30/17  Yes Gerda Diss, DO  traMADol (ULTRAM) 50 MG tablet Take 1 tablet (50 mg total) by mouth every 6 (six) hours as needed. 08/03/17  Yes Gerda Diss, DO   Patient Active Problem List  Diagnosis Date Noted  . Acute pain of right knee 08/03/2017  . Left shoulder pain 07/14/2017  . Lumbar spondylosis 06/20/2017  . Diverticulosis 06/20/2017  . Hemorrhoids, external 06/20/2017  . Muscle cramping 06/20/2017  . Strain of gluteus medius 01/29/2017  . Benign prostatic hyperplasia without urinary obstruction 06/17/2014  . Right inguinal hernia 06/17/2014  . Sacroiliac joint dysfunction of left side 04/05/2014  . Hip pain, left 02/23/2014  . Chronic hyperkalemia 05/20/2012  . Prostatitis 05/20/2012  . Ankle pain  05/19/2012  . Left ankle sprain 05/19/2012  . BPPV (benign paroxysmal positional vertigo) 07/01/2011  . Concussion syndrome 05/15/2011  . DEGENERATIVE JOINT DISEASE, HANDS 10/16/2009  . SHOULDER PAIN, RIGHT 08/04/2008  . Inguinal pain, right 08/04/2008  . NEUROPATHY 03/01/2008  . SYMPTOM, PAIN, ABDOMINAL, RIGHT UP QUADRANT 12/31/2006  . INJURY NOS, HIP/THIGH 12/31/2006   Past Medical History:  Diagnosis Date  . Prostatitis   . Vertigo    Family History  Problem Relation Age of Onset  . Hearing loss Father   . Colon cancer Neg Hx    Past Surgical History:  Procedure Laterality Date  . ANKLE ARTHROSCOPY    . KNEE ARTHROSCOPY     Social History   Occupational History  . Not on file.   Social History Main Topics  . Smoking status: Never Smoker  . Smokeless tobacco: Never Used  . Alcohol use No  . Drug use: No  . Sexual activity: Not on file    OBJECTIVE:  VS:  HT:5\' 7"  (170.2 cm)   WT:146 lb (66.2 kg)  BMI:22.86    BP:120/72  HR:(!) 49bpm  TEMP: ( )  RESP:97 % EXAM: Findings:  Right knee: Overall well aligned.  No significant varus or valgus deformity.  Full range of motion of the knee.  He has medial joint line pain.  Pain with McMurray's.  Small effusion only.  Walks with a slightly antalgic gait.    RADIOLOGY: X-rays reveal overall well-maintained joint space with only very mild degenerative change of the right medial compartment.  Small effusion. ASSESSMENT & PLAN:     ICD-10-CM   1. Acute pain of right knee M25.561 DG Knee AP/LAT W/Sunrise Right    [place] LIMITED JOINT SPACE STRUCTURES LOW RIGHT(NO LINKED CHARGES)  2. Low back pain, unspecified back pain laterality, unspecified chronicity, with sciatica presence unspecified M54.5 traMADol (ULTRAM) 50 MG tablet  3. Sacroiliac joint dysfunction of left side M53.3 traMADol (ULTRAM) 50 MG tablet   ================================================================= Acute pain of right knee Injection performed  today.  He has a bulging of his medial meniscus and remote medial meniscectomy.  Remarkably he has only very mild degenerative changes on his x-rays and has actually completed over 100 marathon since his knee arthroscopy.  I would like for him to avoid significant exacerbating activities for the next 2 weeks but will plan to allow him to return to activities as tolerated at that time.  Follow-up in 6 weeks to ensure clinical resolution.  Marathon coming up in December and [place].  Sacroiliac joint dysfunction of left side Chronic pain, tramadol refill.  PROCEDURE NOTE - ULTRASOUND GUIDED INJECTION: RIGHT Knee Images were obtained and interpreted by myself, Teresa Coombs, DO  Images have been saved and stored to PACS system. Images obtained on: GE S7 Ultrasound machine  ULTRASOUND FINDINGS:  Small effusion, no significant synovitis.  Minimal degenerative changes.  He does have degenerative bulging with an interstitial split of the medial meniscus.  DESCRIPTION OF PROCEDURE:  The  patient's clinical condition is marked by substantial pain and/or significant functional disability. Other conservative therapy has not provided relief, is contraindicated, or not appropriate. There is a reasonable likelihood that injection will significantly improve the patient's pain and/or functional impairment. After discussing the risks, benefits and expected outcomes of the injection and all questions were reviewed and answered, the patient wished to undergo the above named procedure. Verbal consent was obtained. The ultrasound was used to identify the target structure and adjacent neurovascular structures. The skin was then prepped in sterile fashion and the target structure was injected under direct visualization using sterile technique as below: PREP: Alcohol, Ethel Chloride APPROACH: Superiolateral, single injection, 25g 1.5" needle INJECTATE: 2cc 1% lidocaine, 2 cc 40mg  DepoMedrol ASPIRATE:  N/A DRESSING: Band-Aid and full knee Body Helix  Post procedural instructions including recommending icing and warning signs for infection were reviewed. This procedure was well tolerated and there were no complications.   IMPRESSION: Succesful US Guided Injection   =================================================================  ================================================================= Future Appointments Date Time Provider [place]  09/14/2017 9:20 AM Gerda Diss, DO [facility] None    Follow-up: Return in about 6 weeks (around 09/14/2017).   CMA/ATC served as Education administrator during this visit. History, Physical, and Plan performed by medical provider. Documentation and orders reviewed and attested to.      Teresa Coombs, [facility] Physician

## 2017-08-03 NOTE — Assessment & Plan Note (Signed)
Injection performed today.  He has a bulging of his medial meniscus and remote medial meniscectomy.  Remarkably he has only very mild degenerative changes on his x-rays and has actually completed over 100 marathon since his knee arthroscopy.  I would like for him to avoid significant exacerbating activities for the next 2 weeks but will plan to allow him to return to activities as tolerated at that time.  Follow-up in 6 weeks to ensure clinical resolution.  Marathon coming up in December and [place].

## 2017-08-03 NOTE — Procedures (Signed)
PROCEDURE NOTE - ULTRASOUND GUIDED INJECTION: RIGHT Knee Images were obtained and interpreted by myself, Teresa Coombs, DO  Images have been saved and stored to PACS system. Images obtained on: GE S7 Ultrasound machine  ULTRASOUND FINDINGS:  Small effusion, no significant synovitis.  Minimal degenerative changes.  He does have degenerative bulging with an interstitial split of the medial meniscus.  DESCRIPTION OF PROCEDURE:  The patient's clinical condition is marked by substantial pain and/or significant functional disability. Other conservative therapy has not provided relief, is contraindicated, or not appropriate. There is a reasonable likelihood that injection will significantly improve the patient's pain and/or functional impairment. After discussing the risks, benefits and expected outcomes of the injection and all questions were reviewed and answered, the patient wished to undergo the above named procedure. Verbal consent was obtained. The ultrasound was used to identify the target structure and adjacent neurovascular structures. The skin was then prepped in sterile fashion and the target structure was injected under direct visualization using sterile technique as below: PREP: Alcohol, Ethel Chloride APPROACH: Superiolateral, single injection, 25g 1.5" needle INJECTATE: 2cc 1% lidocaine, 2 cc 40mg  DepoMedrol ASPIRATE: N/A DRESSING: Band-Aid and full knee Body Helix  Post procedural instructions including recommending icing and warning signs for infection were reviewed. This procedure was well tolerated and there were no complications.   IMPRESSION: Succesful US Guided Injection

## 2017-08-27 ENCOUNTER — Ambulatory Visit (INDEPENDENT_AMBULATORY_CARE_PROVIDER_SITE_OTHER): Payer: PPO | Admitting: Sports Medicine

## 2017-08-27 ENCOUNTER — Ambulatory Visit: Payer: Self-pay

## 2017-08-27 ENCOUNTER — Encounter: Payer: Self-pay | Admitting: Sports Medicine

## 2017-08-27 VITALS — BP 110/70 | HR 43 | Ht 67.0 in | Wt 145.4 lb

## 2017-08-27 DIAGNOSIS — M25552 Pain in left hip: Secondary | ICD-10-CM | POA: Diagnosis not present

## 2017-08-27 DIAGNOSIS — M545 Low back pain: Secondary | ICD-10-CM

## 2017-08-27 DIAGNOSIS — M533 Sacrococcygeal disorders, not elsewhere classified: Secondary | ICD-10-CM

## 2017-08-27 MED ORDER — NITROGLYCERIN 0.2 MG/HR TD PT24: MEDICATED_PATCH | TRANSDERMAL | 1 refills | Status: DC

## 2017-08-27 MED ORDER — TRAMADOL HCL 50 MG PO TABS: 50.0000 mg | ORAL_TABLET | Freq: Four times a day (QID) | ORAL | 1 refills | Status: DC | PRN

## 2017-08-27 NOTE — Progress Notes (Signed)
OFFICE VISIT NOTE [name]. Jahzion Brogden, [facility]  AKBAR SACRA - 66 y.o. male MRN [id number]  Date of birth: 08/14/1951  Visit Date: 08/27/2017  PCP: Briscoe Deutscher, DO   Referred by: Briscoe Deutscher, DO  Josepha Pigg, CMA acting as scribe for Dr. [name].  SUBJECTIVE:   Chief Complaint  Patient presents with  . Follow-up    back and hip pain   HPI: As below and per problem based documentation when appropriate.  Mr. Musick is an established patient presenting today in follow-up of LT sided low back and hip pain. He was last seen 08/03/17. He had xray l-spine 06/01/17.  He was training for a marathon and last week he felt increased pain and he feels like he has re-injured his hip. He lower back pain has also gotten worse. The pain is constant and feels like a tooth ache. He is unable to run d/t the pain and it keeps him up at night. Hip pain is worse when twisting. Hip and back pain are worse when he attempts to run and his foot first hits the ground. He has tried taking Aleve with no relief. Lower back pain does radiate into the glute and also to the anterior aspect of the hip. He denies pain in the groin. He denies clicking and popping, numbness or tingling. He did a 102 mile bike ride and 13 miles run 2 weeks ago and he noticed an increase in his pain level the next day. He started using Nitro patches again this past Thursday.     Review of Systems  Constitutional: Negative for chills, fever and malaise/fatigue.  Respiratory: Negative for shortness of breath and wheezing.   Cardiovascular: Negative for chest pain, palpitations and leg swelling.  Musculoskeletal: Positive for back pain and myalgias.  Neurological: Negative for dizziness, weakness and headaches.    Otherwise per HPI.   HISTORY & PERTINENT PRIOR DATA:  Prior History reviewed and updated per electronic medical record. Significant history, findings,  studies and interim changes include: No additional findings.  reports that  has never smoked. he has never used smokeless tobacco. No results for input(s): HGBA1C, LABURIC, CREATINE in the last 8760 hours. No problems updated.  OBJECTIVE:  VS:  HT:5\' 7"  (170.2 cm)   WT:145 lb 6.4 oz (66 kg)  BMI:22.77    BP:110/70  HR:(!) 43bpm  TEMP: ( )  RESP:97 %  PHYSICAL EXAM: Constitutional: WDWN, Non-toxic appearing. Psychiatric: Alert & appropriately interactive.Not depressed or anxious appearing. Respiratory: No increased work of breathing. Trachea Midline Eyes: Pupils are equal. EOM intact without nystagmus. No scleral icterus  LOWER EXTREMITIES: No clubbing or cyanosis appreciated No significant venous stasis changes No calf tenderness, negative Homan's sign, no calf cords Generalized/Pre-tibial edema: none Pedal Pulses: Normal & symmetrically palpable  Sensation in LE dermatomes: intact to light touch   Left leg: Overall well aligned.  No significant deformity.  He is walking with a significant antalgic gait.  He has pain with resisted hamstring flexion.  Pain localizes to the high hamstring.  ASSESSMENT & PLAN:   1. Left hip pain   2. Low back pain, unspecified back pain laterality, unspecified chronicity, with sciatica presence unspecified   3. Sacroiliac joint dysfunction of left side    Plan: Symptoms are consistent with a acute on chronic left hamstring strain.  We will have him begin working on therapeutic exercises per AVS, began nitroglycerin protocol and low-dose of  tramadol for pain relief as needed.   Discussed activity modifications and slow return to activity as tolerated Craniocaudal exercises recommended Impression regarding massage provided.  No problem-specific Assessment & Plan notes found for this encounter.   LIMITED MSK ULTRASOUND OF left leg Images were obtained and interpreted by myself, Teresa Coombs, DO  Images have been saved and stored to PACS  system. Images obtained on: GE S7 Ultrasound machine  FINDINGS:   Lateral hamstring has hypoechoic change with slight abnormality at the origin.  There is slight increased neovascularity.  IMPRESSION:  1. Left upper hamstring strain   ++++++++++++++++++++++++++++++++++++++++++++ Orders:  Orders Placed This Encounter  Procedures  . [place] LIMITED JOINT SPACE STRUCTURES LOW LEFT(NO LINKED CHARGES)    Meds:  Meds ordered this encounter  Medications  . traMADol (ULTRAM) 50 MG tablet    Sig: Take 1 tablet (50 mg total) by mouth every 6 (six) hours as needed.    Dispense:  120 tablet    Refill:  1  . nitroGLYCERIN (NITRODUR - DOSED IN MG/24 HR) 0.2 mg/hr patch    Sig: Place 1/4 of patch over affected region. Remove and replace once daily.  Slightly alter skin placement daily    Dispense:  30 patch    Refill:  1    ++++++++++++++++++++++++++++++++++++++++++++ Follow-up: Return in about 6 weeks (around 10/08/2017).   Pertinent documentation may be included in additional procedure notes, imaging studies, problem based documentation and patient instructions. Please see these sections of the encounter for additional information regarding this visit. CMA/ATC served as Education administrator during this visit. History, Physical, and Plan performed by medical provider. Documentation and orders reviewed and attested to.      Gerda Diss, [facility] Physician

## 2017-08-27 NOTE — Patient Instructions (Signed)
Look into the "Craniocradle" on amaz[employer] to use to help massage/stretch your SI joint.  Rosamaria Lints @ [facility] is a great massage therapist.  Look into setting up a massage for your back and your legs.  [phone]

## 2017-09-14 ENCOUNTER — Encounter: Payer: Self-pay | Admitting: Family Medicine

## 2017-09-14 ENCOUNTER — Ambulatory Visit: Payer: PPO | Admitting: Sports Medicine

## 2017-09-14 ENCOUNTER — Ambulatory Visit: Payer: PPO | Admitting: Family Medicine

## 2017-09-14 VITALS — BP 110/80 | HR 77 | Temp 98.2°F | Ht 67.0 in | Wt 144.2 lb

## 2017-09-14 DIAGNOSIS — R05 Cough: Secondary | ICD-10-CM

## 2017-09-14 DIAGNOSIS — R059 Cough, unspecified: Secondary | ICD-10-CM

## 2017-09-14 MED ORDER — IPRATROPIUM BROMIDE 0.06 % NA SOLN: 2.0000 | Freq: Four times a day (QID) | NASAL | 12 refills | Status: DC

## 2017-09-14 MED ORDER — AZITHROMYCIN 250 MG PO TABS: ORAL_TABLET | ORAL | 0 refills | Status: DC

## 2017-09-14 NOTE — Progress Notes (Signed)
    Subjective:  William Underwood is a 66 y.o. male who presents today with a chief complaint of cough.   HPI:  Cough, Acute Issue Symptoms started about 10 days. Worsened over that time.  Tried taking Tylenol and Zyrtec with minimal relief.  He is a marathon runner and cyclist.  Was able to recently run a marathon and enjoy a 59 mile ride without significant worsening of his symptoms.  He does have occasional chest congestion with burning sensation in his chest.  Associated symptoms also include rhinorrhea, headache, and postnasal drip.  No other obvious precipitating or aggravating factors.  No fevers.  No nausea or vomiting.  ROS: Per HPI  PMH: Smoking history reviewed. Never smoker.   Objective:  Physical Exam: BP 110/80   Pulse 77   Temp 98.2 F (36.8 C) (Oral)   Ht 5\' 7"  (1.702 m)   Wt 144 lb 3.2 oz (65.4 kg)   SpO2 98%   BMI 22.58 kg/m   Gen: NAD, resting comfortably HEENT TMs clear.  Oropharynx clear.  Nasal mucosa erythematous and boggy bilaterally.  Moist mucous membranes.  No lymphadenopathy. CV: RRR with no murmurs appreciated Pulm: NWOB, CTAB with no crackles, wheezes, or rhonchi  Assessment/Plan:  Cough, acute issue Given that symptoms have persisted for greater than 10 days, it is reasonable to start empiric antibiotics today to treat for bacterial etiology.  Prescription for Z-Pak sent in.  Start Atrovent nasal spray.  Recommended continue using Tylenol as needed.  Encouraged good oral hydration.  Return precautions reviewed.  Follow-up as needed.  [name]. [name], MD 09/14/2017 2:43 PM

## 2017-09-21 ENCOUNTER — Encounter: Payer: Self-pay | Admitting: Family Medicine

## 2017-09-21 ENCOUNTER — Ambulatory Visit: Payer: PPO | Admitting: Family Medicine

## 2017-09-21 VITALS — BP 131/80 | HR 49 | Temp 97.7°F | Ht 67.0 in | Wt 145.0 lb

## 2017-09-21 DIAGNOSIS — R05 Cough: Secondary | ICD-10-CM

## 2017-09-21 DIAGNOSIS — R059 Cough, unspecified: Secondary | ICD-10-CM

## 2017-09-21 MED ORDER — DOXYCYCLINE HYCLATE 100 MG PO TABS: 100.0000 mg | ORAL_TABLET | Freq: Two times a day (BID) | ORAL | 0 refills | Status: AC

## 2017-09-21 NOTE — Progress Notes (Signed)
    Subjective:  William Underwood is a 66 y.o. male who presents today with a chief complaint of cough.   HPI:  Cough, established problem, worsening Seen 1 week ago for similar issue.  At that time symptoms have persisted for about 10 days and patient was treated with atrovent and azithromycin.  Symptoms improved, however patient ran a 5K race about 4 days ago and then symptoms worsened afterwards.  He has noticed a mild improvement in his symptoms in the past 1-2 days.  No shortness of breath.  No fevers.  No chills.  No other treatments tried.  ROS: Per HPI  Objective:  Physical Exam: BP 131/80   Pulse (!) 49   Temp 97.7 F (36.5 C)   Ht 5\' 7"  (1.702 m)   Wt 145 lb (65.8 kg)   SpO2 95%   BMI 22.71 kg/m   Gen: NAD, resting comfortably CV: RRR with no murmurs appreciated Pulm: NWOB, CTAB with no crackles, wheezes, or rhonchi  Assessment/Plan:  Cough Lung exam normal.  Symptoms are likely related to him running a race in cold, dry air in the setting of bronchial irritation due to his recent URI.  Reassured patient that he did not have any findings suggestive of bronchitis or pneumonia.  Encourage patient to rest over the next 1-2 days.  Patient requested another course of antibiotic-sent in a "pocket prescription" for doxycycline with strict instruction to not start unless symptoms fail to improve or worsen over the next 3-5 days.  Continue Tylenol and/or Motrin as needed for pain and low-grade fever.  Discussed return precautions including shortness of breath, wheezing, fevers, and chills.  [name]. [name], MD 09/21/2017 11:41 AM

## 2017-09-28 ENCOUNTER — Ambulatory Visit: Payer: PPO | Admitting: Sports Medicine

## 2017-09-28 ENCOUNTER — Encounter: Payer: Self-pay | Admitting: Sports Medicine

## 2017-10-07 ENCOUNTER — Ambulatory Visit: Payer: Self-pay

## 2017-10-07 ENCOUNTER — Ambulatory Visit: Payer: PPO | Admitting: Sports Medicine

## 2017-10-07 ENCOUNTER — Encounter: Payer: Self-pay | Admitting: Sports Medicine

## 2017-10-07 VITALS — BP 114/70 | HR 48 | Ht 67.0 in | Wt 147.2 lb

## 2017-10-07 DIAGNOSIS — M25552 Pain in left hip: Secondary | ICD-10-CM

## 2017-10-07 DIAGNOSIS — S76012A Strain of muscle, fascia and tendon of left hip, initial encounter: Secondary | ICD-10-CM

## 2017-10-07 DIAGNOSIS — M9902 Segmental and somatic dysfunction of thoracic region: Secondary | ICD-10-CM

## 2017-10-07 DIAGNOSIS — M47816 Spondylosis without myelopathy or radiculopathy, lumbar region: Secondary | ICD-10-CM

## 2017-10-07 DIAGNOSIS — M533 Sacrococcygeal disorders, not elsewhere classified: Secondary | ICD-10-CM

## 2017-10-07 DIAGNOSIS — M9908 Segmental and somatic dysfunction of rib cage: Secondary | ICD-10-CM | POA: Diagnosis not present

## 2017-10-07 DIAGNOSIS — M9901 Segmental and somatic dysfunction of cervical region: Secondary | ICD-10-CM

## 2017-10-07 DIAGNOSIS — M25512 Pain in left shoulder: Secondary | ICD-10-CM

## 2017-10-07 MED ORDER — GABAPENTIN 300 MG PO CAPS: ORAL_CAPSULE | ORAL | 1 refills | Status: DC

## 2017-10-07 NOTE — Procedures (Signed)
PROCEDURE NOTE : OSTEOPATHIC MANIPULATION The decision today to treat with Osteopathic Manipulative Therapy (OMT) was based on physical exam findings. Verbal consent was obtained after after explanation of risks, benefits and potential side effects, including acute pain flare, post manipulation soreness and need for repeat treatments.   Additional time was spent discussing the minimal risk of  injury to neurovascular structures for associated Cervical manipulation.  After verbal consent was obtained manipulation was performed as below:  Contraindications to OMT reviewed and include: NONE.             Regions treated: Per examined regions as below and associated billing codes          Techniques used: [employer], MFR, HVLA and ART The patient tolerated the treatment well and reported Improved symptoms following treatment today. Patient was given medications, exercises, stretches and lifestyle modifications per AVS and verbally.     OSTEOPATHIC/STRUCTURAL EXAM FINDINGS:    C2 through C4 FRS left  C6 FRS right  T2 - T5 extended side bent right, rotated left  Ribs 6 posterior right

## 2017-10-07 NOTE — Procedures (Signed)
LIMITED MSK ULTRASOUND OF left hip and buttock Images were obtained and interpreted by myself, Teresa Coombs, DO  Images have been saved and stored to PACS system. Images obtained on: GE S7 Ultrasound machine  FINDINGS:   Remarkably improved glute medius tendon structure with improved fibrillar definition.    Hypertrophy of the glute medius and tensor fascia lata without significant hypoechoic change.  There is a very small bursal layer within the greater trochanteric region but this is minimal.  Iliolumbar ligament is visible and hypertrophied with slight increased neovascularity  IMPRESSION:  1. Greater trochanteric tendinopathy, improved 2. Small amount of greater trochanteric bursitis 3. Iliolumbar hypertrophy

## 2017-10-07 NOTE — Assessment & Plan Note (Signed)
Likely reflective of cervical. With radiculitis Cervical traction discussed Osteopathic manipulation performed today after discussing risks and benefits Referral to physical therapy for consideration of dry needling  Consider further diagnostic evaluation with MRI of the cervical spine if persistently

## 2017-10-07 NOTE — Progress Notes (Signed)
William Bond. [place], [place] [facility] [place]  DEEN DEGUIA - 66 y.o. male MRN [id number]  Date of birth: 11-28-1950   Scribe for today's visit: Wendy Poet, ATC    SUBJECTIVE:  William Underwood is here for Follow-up (LBP and L hip pain) and Follow-up (L neck and shoulder pain) .   Compared to the last office visit on 08/27/17, his previously described LBP and L hip pain symptoms are worsening.  He states that he last ran on Saturday (had to stop 4 miles into an 8 mile run) and notes that his L hip are feeling worse and feels that the swelling has returned along the lateral aspect of his L hip.  He con't to have pain in his L SIJ. Current symptoms are moderate & are radiating to the L lat thigh.  He notes that his symptoms are severe when he attempts to run. He has been using the nitroglycerin patches and Tramadol.  Wants to talk about his L neck and shoulder too.   ROS Reports night time disturbances L neck and shoulder Denies fevers, chills, or night sweats. Denies unexplained weight loss. Denies personal history of cancer. Denies changes in bowel or bladder habits. Denies recent unreported falls. Denies new or worsening dyspnea or wheezing. Denies headaches or dizziness.  Denies numbness, tingling or weakness  In the extremities.  Denies dizziness or presyncopal episodes Reports lower extremity edema     HISTORY & PERTINENT PRIOR DATA:  Prior History reviewed and updated per electronic medical record.  Significant history, findings, studies and interim changes include:  reports that  has never smoked. he has never used smokeless tobacco. No results for input(s): HGBA1C, LABURIC, CREATINE in the last 8760 hours. No specialty comments available. Problem  Left Shoulder Pain  Hip Pain, Left   Today this primarily appears like a gluteus medius syndrome with a secondary greater trochanteric bursitis      HISTORY &  PERTINENT PRIOR DATA:  Prior History reviewed and updated per electronic medical record.  Significant history, findings, studies and interim changes include:  reports that  has never smoked. he has never used smokeless tobacco. No results for input(s): HGBA1C, LABURIC, CREATINE in the last 8760 hours. No specialty comments available. Problem  Left Shoulder Pain  Hip Pain, Left   Today this primarily appears like a gluteus medius syndrome with a secondary greater trochanteric bursitis      OBJECTIVE:  VS:  HT:5\' 7"  (170.2 cm)   WT:147 lb 3.2 oz (66.8 kg)  BMI:23.05    BP:114/70  HR:(!) 48bpm  TEMP: ( )  RESP:98 %  PHYSICAL EXAM: Constitutional: WDWN, Non-toxic appearing. Psychiatric: Alert & appropriately interactive. Not depressed or anxious appearing. Respiratory: No increased work of breathing. Trachea Midline Eyes: Pupils are equal. EOM intact without nystagmus. No scleral icterus Cardiovascular:  Peripheral Pulses: peripheral pulses symmetrical No clubbing or cyanosis appreciated Capillary Refill is normal, less than 2 seconds No signficant generalized edema/anasarca Sensory Exam: intact to light touch  Neck and shoulder: Multiple trigger points within the trapezius and levator scapula.  Upper extremity strength is 5+/5.  Upper extremity sensation is intact light touch.  He has no significant pain with brachial plexus squeeze or arm squeeze test but does have pain with Spurling's.  Slight torticollis to the left.  Left hip and back: Negative straight leg raise.  He does have pain across the iliolumbar ligament as well as over the SI  joint.  Some fullness of the tensor fascia lata but no focal soft tissue swelling.  He has improved strength with hip abduction and improved glute medius recruitment.    ASSESSMENT & PLAN:   1. Acute pain of left shoulder   2. Strain of gluteus medius of left lower extremity, initial encounter   3. Sacroiliac joint dysfunction of left side    4. Lumbar spondylosis   5. Somatic dysfunction of cervical region   6. Somatic dysfunction of thoracic region   7. Somatic dysfunction of rib cage region   8. Hip pain, left    PLAN:    Left shoulder pain Likely reflective of cervical. With radiculitis Cervical traction discussed Osteopathic manipulation performed today after discussing risks and benefits Referral to physical therapy for consideration of dry needling  Consider further diagnostic evaluation with MRI of the cervical spine if persistently  Hip pain, left Persistent ongoing left greater trochanteric pain syndrome with glute medius dysfunction and iliolumbar ligament hypertrophy appreciated on ultrasound today.  Is a likely reflective of chronic underlying lumbar spondylosis and I would like to obtain further diagnostic evaluation with MRI of the lumbar spine.  He has had a fairly extensive workup of the hip and back to this point has had overall good improvements but his symptoms continue to be quite severe.  I would additionally like for him to start gabapentin and we will plan to follow-up after the MRI of the lumbar spine is obtained.  Consider dry needling to the iliolumbar region   ++++++++++++++++++++++++++++++++++++++++++++ Orders & Meds: Orders Placed This Encounter  Procedures  . OSTEOPATHIC MANIPULATION TREATMENT  . [place] LIMITED JOINT SPACE STRUCTURES LOW LEFT(NO LINKED CHARGES)  . MR Lumbar Spine Wo Contrast  . Ambulatory referral to Physical Therapy    Meds ordered this encounter  Medications  . gabapentin (NEURONTIN) 300 MG capsule    Sig: Start with 1 tab po qhs X 1 week, then increase to 1 tab po bid X 1 week then 1 tab po tid prn    Dispense:  90 capsule    Refill:  1    ++++++++++++++++++++++++++++++++++++++++++++ Follow-up: Return for MRI review.   Pertinent documentation may be included in additional procedure notes, imaging studies, problem based documentation and patient instructions. Please  see these sections of the encounter for additional information regarding this visit. CMA/ATC served as Education administrator during this visit. History, Physical, and Plan performed by medical provider. Documentation and orders reviewed and attested to.      Gerda Diss, [facility] Physician

## 2017-10-07 NOTE — Patient Instructions (Signed)

## 2017-10-07 NOTE — Assessment & Plan Note (Signed)
Persistent ongoing left greater trochanteric pain syndrome with glute medius dysfunction and iliolumbar ligament hypertrophy appreciated on ultrasound today.  Is a likely reflective of chronic underlying lumbar spondylosis and I would like to obtain further diagnostic evaluation with MRI of the lumbar spine.  He has had a fairly extensive workup of the hip and back to this point has had overall good improvements but his symptoms continue to be quite severe.  I would additionally like for him to start gabapentin and we will plan to follow-up after the MRI of the lumbar spine is obtained.  Consider dry needling to the iliolumbar region

## 2017-10-12 ENCOUNTER — Ambulatory Visit: Payer: PPO

## 2017-10-17 ENCOUNTER — Ambulatory Visit
Admission: RE | Admit: 2017-10-17 | Discharge: 2017-10-17 | Disposition: A | Discharge status: Home / Self Care | Payer: PPO | Source: Ambulatory Visit | Attending: Sports Medicine | Admitting: Sports Medicine

## 2017-10-17 DIAGNOSIS — M48061 Spinal stenosis, lumbar region without neurogenic claudication: Secondary | ICD-10-CM | POA: Diagnosis not present

## 2017-10-17 DIAGNOSIS — M47816 Spondylosis without myelopathy or radiculopathy, lumbar region: Secondary | ICD-10-CM

## 2017-10-17 DIAGNOSIS — M533 Sacrococcygeal disorders, not elsewhere classified: Secondary | ICD-10-CM

## 2017-10-23 ENCOUNTER — Ambulatory Visit (INDEPENDENT_AMBULATORY_CARE_PROVIDER_SITE_OTHER): Payer: PPO | Admitting: Sports Medicine

## 2017-10-23 ENCOUNTER — Encounter: Payer: Self-pay | Admitting: Sports Medicine

## 2017-10-23 VITALS — BP 104/68 | HR 51 | Ht 67.0 in | Wt 148.0 lb

## 2017-10-23 DIAGNOSIS — M47816 Spondylosis without myelopathy or radiculopathy, lumbar region: Secondary | ICD-10-CM | POA: Diagnosis not present

## 2017-10-23 DIAGNOSIS — M5126 Other intervertebral disc displacement, lumbar region: Secondary | ICD-10-CM

## 2017-10-23 MED ORDER — GABAPENTIN 100 MG PO CAPS: ORAL_CAPSULE | ORAL | 1 refills | Status: DC

## 2017-10-23 NOTE — Assessment & Plan Note (Signed)
Multilevel degenerative changes.  Given the significant findings and her symptoms I do think that beginning with an epidural at the L4-L5 level is appropriate.  Can consider further injection into the L4-L5 facet given the underlying effusion if any lack of improvement with ESI.  Referral placed to Dr. Ernestina Patches for this.  We also discussed continuing with therapeutic exercises and continue to work on intrinsic low back strengthening given the significant fat atrophy on MRI of the multifidus at the lower levels.

## 2017-10-23 NOTE — Progress Notes (Signed)
William Underwood, [facility] [place]  William Underwood - 66 y.o. male MRN [id number]  Date of birth: [birth year]  Visit Date: 10/23/2017  PCP: Briscoe Deutscher, DO   Referred by: Briscoe Deutscher, DO   Scribe for today's visit: Wendy Poet PT, LAT, ATC  SUBJECTIVE:  William Underwood is here for Follow-up (lumbar spondylosis, MRI review)  Compared to the last office visit on 10/07/17, his previously described LBP and L hip pain symptoms are improving from his last visit.  He has been running and can run for 2 days in a row but needs to rest on the 3rd.  L neck and shoulder con't to bother him too. Current symptoms are moderate & are radiating to L lateral thigh. He has been using Gabapentin and the nitroglycerin patches.  He has not yet made a PT appt for his neck and L shoulder.   ROS Denies night time disturbances. Denies fevers, chills, or night sweats. Denies unexplained weight loss. Denies personal history of cancer. Denies changes in bowel or bladder habits. Denies recent unreported falls. Denies new or worsening dyspnea or wheezing. Denies headaches or dizziness.  Denies numbness, tingling or weakness  In the extremities.  Denies dizziness or presyncopal episodes Denies lower extremity edema     HISTORY & PERTINENT PRIOR DATA:  Prior History reviewed and updated per electronic medical record.  Significant history, findings, studies and interim changes include:  reports that  has never smoked. he has never used smokeless tobacco. No results for input(s): HGBA1C, LABURIC, CREATINE in the last 8760 hours. No specialty comments available. Problem  Lumbar Spondylosis   Fairly significant L4-L5 facet arthropathy with effusion.  Can consider facet injections and potentially RFA down the road.      OBJECTIVE:  VS:  HT:5\' 7"  (170.2 cm)   WT:148 lb (67.1 kg)  BMI:23.17    BP:104/68  HR:(!) 51bpm  TEMP: ( )   RESP:96 %  PHYSICAL EXAM: Constitutional: WDWN, Non-toxic appearing. Psychiatric: Alert & appropriately interactive. Not depressed or anxious appearing. Respiratory: No increased work of breathing. Trachea Midline Eyes: Pupils are equal. EOM intact without nystagmus. No scleral icterus Cardiovascular:  Peripheral Pulses: peripheral pulses symmetrical No clubbing or cyanosis appreciated Capillary Refill is normal, less than 2 seconds No signficant generalized edema/anasarca Sensory Exam: intact to light touch  Focal TDP over the L4-L5 facet and L5-S1 facet as well as slightly over the SI joint.  He has pain with flexion and extension.  Pain over the iliotibial band and tensor fascia lata.  Overall his lower extremity strength is otherwise well preserved.   ASSESSMENT & PLAN:   1. Herniated intervertebral disc of lumbar spine   2. Lumbar spondylosis    PLAN:    Lumbar spondylosis Multilevel degenerative changes.  Given the significant findings and her symptoms I do think that beginning with an epidural at the L4-L5 level is appropriate.  Can consider further injection into the L4-L5 facet given the underlying effusion if any lack of improvement with ESI.  Referral placed to Dr. Ernestina Patches for this.  We also discussed continuing with therapeutic exercises and continue to work on intrinsic low back strengthening given the significant fat atrophy on MRI of the multifidus at the lower levels.  >50% of this 25 minute visit spent in direct patient counseling and/or coordination of care.  Discussion was focused on education regarding the in discussing the pathoetiology and anticipated clinical course of  the above condition.  ++++++++++++++++++++++++++++++++++++++++++++ Orders & Meds: Orders Placed This Encounter  Procedures  . Ambulatory referral to Physical Medicine Rehab    Meds ordered this encounter  Medications  . gabapentin (NEURONTIN) 100 MG capsule    Sig: 1 tab po bid + 300mg  dose at  night    Dispense:  60 capsule    Refill:  1    ++++++++++++++++++++++++++++++++++++++++++++ Follow-up: Return in about 6 weeks (around 12/04/2017).   Pertinent documentation may be included in additional procedure notes, imaging studies, problem based documentation and patient instructions. Please see these sections of the encounter for additional information regarding this visit. CMA/ATC served as Education administrator during this visit. History, Physical, and Plan performed by medical provider. Documentation and orders reviewed and attested to.      Gerda Diss, [facility] Physician

## 2017-10-30 ENCOUNTER — Ambulatory Visit (INDEPENDENT_AMBULATORY_CARE_PROVIDER_SITE_OTHER): Payer: PPO | Admitting: Physical Therapy

## 2017-10-30 DIAGNOSIS — M542 Cervicalgia: Secondary | ICD-10-CM | POA: Diagnosis not present

## 2017-10-30 DIAGNOSIS — M25552 Pain in left hip: Secondary | ICD-10-CM | POA: Diagnosis not present

## 2017-10-30 DIAGNOSIS — R2689 Other abnormalities of gait and mobility: Secondary | ICD-10-CM | POA: Diagnosis not present

## 2017-10-30 DIAGNOSIS — M25512 Pain in left shoulder: Secondary | ICD-10-CM | POA: Diagnosis not present

## 2017-10-30 NOTE — Therapy (Signed)
[facility] [address] [place], [place], [address] Phone: [phone]   Fax:  [phone]  Physical Therapy Evaluation  Patient Details  Name: William Underwood MRN: [id number] Date of Birth: 16-Feb-1951 Referring Provider: [name]   Encounter Date: 10/30/2017  PT End of Session - 10/30/17 1357    Visit Number  1    Number of Visits  12    Date for PT Re-Evaluation  12/11/17    Authorization Type  HTA    PT Start Time  1215    PT Stop Time  1310    PT Time Calculation (min)  55 min    Activity Tolerance  Patient tolerated treatment well;Patient limited by pain    Behavior During Therapy  [facility] for tasks assessed/performed       Past Medical History:  Diagnosis Date  . Prostatitis   . Vertigo     Past Surgical History:  Procedure Laterality Date  . ANKLE ARTHROSCOPY    . KNEE ARTHROSCOPY      There were no vitals filed for this visit.   Subjective Assessment - 10/30/17 1217    Subjective  Pt states L hip pain that started last year after race. Pt states pain in L hip and L SI. He is going to have injection in near future. He has not had pain in hip previously, but has had soreness in SI joints. 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   He also has L shoulder pain, he thinks started from exercises at [facility], for about 3 months. Pt states soreness in upper trap and posterior aspect of shoulder, up into neck. Pt wishes to focus primarily on shoulder/neck pain at this time, and hip/back sencondarily.     Limitations  Standing;Walking;House hold activities    Patient Stated Goals  Decreased pain    Currently in Pain?  Yes    Pain Score  6     Pain Location  Shoulder    Pain Orientation  Left    Pain Descriptors / Indicators  Tightness;Constant;Aching    Pain Type  Acute pain    Pain Onset  More than a month ago    Pain Frequency  Intermittent    Aggravating Factors   UE movement and  activity, neck motion    Pain Relieving Factors  rest    Multiple Pain Sites  Yes    Pain Score  8    Pain Location  Hip    Pain Orientation  Left    Pain Descriptors / Indicators  Burning;Sharp    Pain Type  Chronic pain    Pain Onset  More than a month ago    Aggravating Factors   walking, running, standing    Pain Relieving Factors  rest         [facility] PT Assessment - 10/30/17 0001      Assessment   Medical Diagnosis  L shoulder pain, L hip and LBP    Referring Provider  [name]    Prior Therapy  No      Precautions   Precautions  None      Balance Screen   Has the patient fallen in the past 6 months  Yes    How many times?  1-2 While running    Has the patient had a decrease in activity level because of a fear of falling?   No    Is the  patient reluctant to leave their home because of a fear of falling?   No      Prior Function   Level of Independence  Independent      Cognition   Overall Cognitive Status  Within Functional Limits for tasks assessed      ROM / Strength   AROM / PROM / Strength  AROM;Strength      AROM   Overall AROM Comments  Bil shoulder ROM: WNL    AROM Assessment Site  Shoulder;Hip;Cervical    Right/Left Shoulder  Left    Right/Left Hip  Left moderate to significant limitation for all motions    Cervical Flexion  wfl    Cervical Extension  mild limitation and pain    Cervical - Right Rotation  70    Cervical - Left Rotation  35 and pain      Strength   Overall Strength Comments  Shoulder strength: 4+/5;  Hip strength: 4-/5      Flexibility   Soft Tissue Assessment /Muscle Length  yes    Hamstrings  significant limitation      Palpation   Palpation comment  Significant pain and tenderness at L upper trap, levator, and scalenes with trigger points.  Significant pain at L glute med;              Objective measurements completed on examination: See above findings.      [facility] PT Treatment/Exercise - 10/30/17 1428       Exercises   Exercises  Neck;Knee/Hip      Knee/Hip Exercises: Stretches   ITB Stretch  3 reps;30 seconds    ITB Stretch Limitations  supine with strap    Piriformis Stretch  3 reps;30 seconds    Piriformis Stretch Limitations  supine hip across body (IR)    Other Knee/Hip Stretches  Standing ITB stretch 30 sec x3 ;       Manual Therapy   Manual Therapy  Soft tissue mobilization;Other (comment)    Other Manual Therapy  Manual stretches for upper trap and levator      Neck Exercises: Stretches   Upper Trapezius Stretch  2 reps;30 seconds    Levator Stretch  2 reps;30 seconds             PT Education - 10/30/17 1356    Education provided  Yes    Education Details  HEP, activity modification    Person(s) Educated  Patient    Methods  Explanation;Demonstration;Tactile cues;Handout    Comprehension  Verbalized understanding       PT Short Term Goals - 10/30/17 1414      PT SHORT TERM GOAL #1   Title  Pt to report decreased pain in L upper trap region, to 4/10     Time  2    Period  Weeks    Status  New    Target Date  11/13/17        PT Long Term Goals - 10/30/17 1415      PT LONG TERM GOAL #1   Title  Pt to report decreased pain in L upper trap region to 0-2/10 with activity     Time  6    Period  Weeks    Status  New    Target Date  12/11/17      PT LONG TERM GOAL #2   Title  Pt to demo increased cervical ROM to be WNL, to improve ability for head turns while driving  and jogging.     Time  6    Period  Weeks    Status  New    Target Date  12/11/17      PT LONG TERM GOAL #3   Title  Pt to demo decreased pain in L hip to 4/10 with running up to 3 miles     Time  6    Period  Weeks    Status  New    Target Date  12/11/17             Plan - 10/30/17 1358    Clinical Impression Statement  Pt presents with primary complaint of increased pain in L shoulder/upper trap, and L hip. He has shoulder ROM WNL, but is limited with neck ROM. He has  significant trigger points, tenderness and pain in L upper trap, scalenes, and levator. He also demonstrates significant tightness in L hip for all motions, as well as weakness. He has significant pain to palpate glute med and anterior/lateral hip. Pt with poor running mechanics, with decreased step height, decreased hip flexion, and whip on R. Pt to benefit from skilled PT to improve deficits, and return to pain free activity. Plan to focus on shoulder/ neck at this time, and will treat hip secondarily. May require futher work on hip in future when shoulder improves. Pt also to benefit from dry needling for trigger points and tightness.     History and Personal Factors relevant to plan of care:  Pt wishes to work on shoulder/neck primarily at  this time.     Clinical Presentation  Evolving    Clinical Decision Making  Low    Rehab Potential  Good    PT Frequency  2x / week    PT Duration  6 weeks    PT Treatment/Interventions  ADLs/Self Care Home Management;Cryotherapy;Electrical Stimulation;Iontophoresis 4mg /ml Dexamethasone;Moist Heat;Therapeutic exercise;Therapeutic activities;Functional mobility training;Stair training;Gait training;Ultrasound;Balance training;Neuromuscular re-education;Patient/family education;Orthotic Fit/Training;Manual techniques;Taping;Dry needling;Passive range of motion    PT Next Visit Plan  Manual for neck    PT Home Exercise Plan  HEP given for L hip, and L neck       Patient will benefit from skilled therapeutic intervention in order to improve the following deficits and impairments:  Abnormal gait, Decreased endurance, Hypomobility, Decreased activity tolerance, Decreased strength, Pain, Increased muscle spasms, Difficulty walking, Decreased mobility, Decreased balance, Decreased range of motion, Improper body mechanics, Impaired flexibility  Visit Diagnosis: Cervicalgia - Plan: PT plan of care cert/re-cert  Acute pain of left shoulder - Plan: PT plan of care  cert/re-cert  Pain in left hip - Plan: PT plan of care cert/re-cert  Other abnormalities of gait and mobility - Plan: PT plan of care cert/re-cert     Problem List Patient Active Problem List   Diagnosis Date Noted  . Acute pain of right knee 08/03/2017  . Left shoulder pain 07/14/2017  . Lumbar spondylosis 06/20/2017  . Diverticulosis 06/20/2017  . Hemorrhoids, external 06/20/2017  . Muscle cramping 06/20/2017  . Strain of gluteus medius 01/29/2017  . Benign prostatic hyperplasia without urinary obstruction 06/17/2014  . Right inguinal hernia 06/17/2014  . Sacroiliac joint dysfunction of left side 04/05/2014  . Hip pain, left 02/23/2014  . Chronic hyperkalemia 05/20/2012  . Prostatitis 05/20/2012  . Ankle pain 05/19/2012  . Left ankle sprain 05/19/2012  . BPPV (benign paroxysmal positional vertigo) 07/01/2011  . Concussion syndrome 05/15/2011  . DEGENERATIVE JOINT DISEASE, HANDS 10/16/2009  . SHOULDER PAIN, RIGHT 08/04/2008  .  Inguinal pain, right 08/04/2008  . NEUROPATHY 03/01/2008  . SYMPTOM, PAIN, ABDOMINAL, RIGHT UP QUADRANT 12/31/2006  . INJURY NOS, HIP/THIGH 12/31/2006   Lyndee Hensen, PT, DPT 2:34 PM  10/30/17    [place], [place], [address] Phone: [phone]   Fax:  [phone]  Name: William Underwood MRN: [id number] Date of Birth: 10-Aug-1951

## 2017-11-05 ENCOUNTER — Ambulatory Visit (INDEPENDENT_AMBULATORY_CARE_PROVIDER_SITE_OTHER): Payer: PPO

## 2017-11-05 ENCOUNTER — Ambulatory Visit (INDEPENDENT_AMBULATORY_CARE_PROVIDER_SITE_OTHER): Payer: PPO | Admitting: Physical Medicine and Rehabilitation

## 2017-11-05 ENCOUNTER — Encounter (INDEPENDENT_AMBULATORY_CARE_PROVIDER_SITE_OTHER): Payer: Self-pay | Admitting: Physical Medicine and Rehabilitation

## 2017-11-05 VITALS — BP 124/85 | HR 58 | Temp 97.5°F

## 2017-11-05 DIAGNOSIS — M5416 Radiculopathy, lumbar region: Secondary | ICD-10-CM | POA: Diagnosis not present

## 2017-11-05 MED ORDER — BETAMETHASONE SOD PHOS & ACET 6 (3-3) MG/ML IJ SUSP
12.0000 mg | Freq: Once | INTRAMUSCULAR | Status: AC
Start: 2017-11-05 — End: 2017-11-05
  Administered 2017-11-05: 12 mg

## 2017-11-05 NOTE — Progress Notes (Deleted)
Pt states pain in left hip that radiates at times to his left leg. Pt states pain has been there since March 2018. Pt states running makes pain worse, not running makes the pain better. +Driver, -BT, -Dye Allergies.

## 2017-11-05 NOTE — Patient Instructions (Signed)

## 2017-11-06 ENCOUNTER — Ambulatory Visit (INDEPENDENT_AMBULATORY_CARE_PROVIDER_SITE_OTHER): Payer: PPO | Admitting: Physical Therapy

## 2017-11-06 ENCOUNTER — Encounter: Payer: Self-pay | Admitting: Physical Therapy

## 2017-11-06 DIAGNOSIS — M25552 Pain in left hip: Secondary | ICD-10-CM

## 2017-11-06 DIAGNOSIS — M542 Cervicalgia: Secondary | ICD-10-CM | POA: Diagnosis not present

## 2017-11-06 DIAGNOSIS — M25512 Pain in left shoulder: Secondary | ICD-10-CM | POA: Diagnosis not present

## 2017-11-06 DIAGNOSIS — R2689 Other abnormalities of gait and mobility: Secondary | ICD-10-CM | POA: Diagnosis not present

## 2017-11-06 NOTE — Patient Instructions (Signed)

## 2017-11-06 NOTE — Therapy (Signed)
[facility] [address] [place], [place], [address] Phone: [phone]   Fax:  [phone]  Physical Therapy Treatment  Patient Details  Name: William Underwood MRN: [id number] Date of Birth: [birth year] Referring Provider: [name]   Encounter Date: 11/06/2017  PT End of Session - 11/06/17 1011    Visit Number  2    Number of Visits  12    Date for PT Re-Evaluation  12/11/17    Authorization Type  HTA    PT Start Time  0925    PT Stop Time  1008    PT Time Calculation (min)  43 min    Activity Tolerance  Patient tolerated treatment well    Behavior During Therapy  [facility] for tasks assessed/performed       Past Medical History:  Diagnosis Date  . Prostatitis   . Vertigo     Past Surgical History:  Procedure Laterality Date  . ANKLE ARTHROSCOPY    . KNEE ARTHROSCOPY      There were no vitals filed for this visit.  Subjective Assessment - 11/06/17 0926    Subjective  doing well; has had DN before in hamstring.      Patient Stated Goals  Decreased pain    Currently in Pain?  Yes    Pain Score  3     Pain Location  Shoulder    Pain Orientation  Left    Pain Descriptors / Indicators  Tingling;Constant;Aching    Pain Type  Acute pain    Pain Onset  More than a month ago    Pain Frequency  Intermittent    Aggravating Factors   running; neck motion    Pain Relieving Factors  rest                      [facility] Adult PT Treatment/Exercise - 11/06/17 1009      Self-Care   Self-Care  Other Self-Care Comments    Other Self-Care Comments   educated on use of tennis ball for myofascial release      Manual Therapy   Manual Therapy  Soft tissue mobilization;Myofascial release    Manual therapy comments  soft tissue montiored throughout dry needling    Soft tissue mobilization  Lt upper trap, rhomboids and levator scapula    Myofascial Release  TPR to Lt upper trap, rhomboids and levator scapula       Trigger Point  Dry Needling - 11/06/17 1009    Consent Given?  Yes    Education Handout Provided  Yes    Muscles Treated Upper Body  Upper trapezius;Levator scapulae;Rhomboids    Upper Trapezius Response  Twitch reponse elicited;Palpable increased muscle length    Levator Scapulae Response  Twitch response elicited;Palpable increased muscle length    Rhomboids Response  Twitch response elicited;Palpable increased muscle length           PT Education - 11/06/17 1010    Education provided  Yes    Education Details  DN    Person(s) Educated  Patient    Methods  Explanation;Handout    Comprehension  Verbalized understanding       PT Short Term Goals - 10/30/17 1414      PT SHORT TERM GOAL #1   Title  Pt to report decreased pain in L upper trap region, to 4/10     Time  2    Period  Weeks    Status  New  Target Date  11/13/17        PT Long Term Goals - 10/30/17 1415      PT LONG TERM GOAL #1   Title  Pt to report decreased pain in L upper trap region to 0-2/10 with activity     Time  6    Period  Weeks    Status  New    Target Date  12/11/17      PT LONG TERM GOAL #2   Title  Pt to demo increased cervical ROM to be WNL, to improve ability for head turns while driving and jogging.     Time  6    Period  Weeks    Status  New    Target Date  12/11/17      PT LONG TERM GOAL #3   Title  Pt to demo decreased pain in L hip to 4/10 with running up to 3 miles     Time  6    Period  Weeks    Status  New    Target Date  12/11/17            Plan - 11/06/17 1011    Clinical Impression Statement  Pt tolerated DN well today with good twitch responses noted in all muscle groups, and decreased active trigger points noted following DN.  Verbally reviewed stretches with pt who reports compliance.  Will continue to benefit from PT to maximize function.    PT Treatment/Interventions  ADLs/Self Care Home Management;Cryotherapy;Electrical Stimulation;Iontophoresis 4mg /ml  Dexamethasone;Moist Heat;Therapeutic exercise;Therapeutic activities;Functional mobility training;Stair training;Gait training;Ultrasound;Balance training;Neuromuscular re-education;Patient/family education;Orthotic Fit/Training;Manual techniques;Taping;Dry needling;Passive range of motion    PT Next Visit Plan  Manual for neck; assess response to DN, progress per POC    Consulted and Agree with Plan of Care  Patient       Patient will benefit from skilled therapeutic intervention in order to improve the following deficits and impairments:  Abnormal gait, Decreased endurance, Hypomobility, Decreased activity tolerance, Decreased strength, Pain, Increased muscle spasms, Difficulty walking, Decreased mobility, Decreased balance, Decreased range of motion, Improper body mechanics, Impaired flexibility  Visit Diagnosis: Cervicalgia  Acute pain of left shoulder  Pain in left hip  Other abnormalities of gait and mobility     Problem List Patient Active Problem List   Diagnosis Date Noted  . Acute pain of right knee 08/03/2017  . Left shoulder pain 07/14/2017  . Lumbar spondylosis 06/20/2017  . Diverticulosis 06/20/2017  . Hemorrhoids, external 06/20/2017  . Muscle cramping 06/20/2017  . Strain of gluteus medius 01/29/2017  . Benign prostatic hyperplasia without urinary obstruction 06/17/2014  . Right inguinal hernia 06/17/2014  . Sacroiliac joint dysfunction of left side 04/05/2014  . Hip pain, left 02/23/2014  . Chronic hyperkalemia 05/20/2012  . Prostatitis 05/20/2012  . Ankle pain 05/19/2012  . Left ankle sprain 05/19/2012  . BPPV (benign paroxysmal positional vertigo) 07/01/2011  . Concussion syndrome 05/15/2011  . DEGENERATIVE JOINT DISEASE, HANDS 10/16/2009  . SHOULDER PAIN, RIGHT 08/04/2008  . Inguinal pain, right 08/04/2008  . NEUROPATHY 03/01/2008  . SYMPTOM, PAIN, ABDOMINAL, RIGHT UP QUADRANT 12/31/2006  . INJURY NOS, HIP/THIGH 12/31/2006      Laureen Abrahams, PT, DPT 11/06/17 10:13 AM    [facility] [place], [place], [address] Phone: [phone]   Fax:  [phone]  Name: William Underwood MRN: [id number] Date of Birth: [birth year]

## 2017-11-10 ENCOUNTER — Ambulatory Visit (INDEPENDENT_AMBULATORY_CARE_PROVIDER_SITE_OTHER): Payer: PPO | Admitting: Physical Therapy

## 2017-11-10 ENCOUNTER — Encounter: Payer: Self-pay | Admitting: Physical Therapy

## 2017-11-10 DIAGNOSIS — M25552 Pain in left hip: Secondary | ICD-10-CM

## 2017-11-10 DIAGNOSIS — M542 Cervicalgia: Secondary | ICD-10-CM

## 2017-11-10 DIAGNOSIS — R2689 Other abnormalities of gait and mobility: Secondary | ICD-10-CM

## 2017-11-10 DIAGNOSIS — M25512 Pain in left shoulder: Secondary | ICD-10-CM

## 2017-11-10 NOTE — Therapy (Signed)
[facility] [address] [place], [place], [address] Phone: [phone]   Fax:  [phone]  Physical Therapy Treatment  Patient Details  Name: William Underwood MRN: [id number] Date of Birth: 01-17-1951 Referring Provider: [name]   Encounter Date: 11/10/2017  PT End of Session - 11/10/17 1142    Visit Number  3    Number of Visits  12    Date for PT Re-Evaluation  12/11/17    Authorization Type  HTA    PT Start Time  1045    PT Stop Time  1128    PT Time Calculation (min)  43 min    Activity Tolerance  Patient tolerated treatment well    Behavior During Therapy  [facility] for tasks assessed/performed       Past Medical History:  Diagnosis Date  . Prostatitis   . Vertigo     Past Surgical History:  Procedure Laterality Date  . ANKLE ARTHROSCOPY    . KNEE ARTHROSCOPY      There were no vitals filed for this visit.  Subjective Assessment - 11/10/17 1141    Subjective  Pt states soreness after last visit for a few days. He states improved neck ROM.     Currently in Pain?  Yes    Pain Score  3     Pain Location  Shoulder    Pain Orientation  Left    Pain Descriptors / Indicators  Tightness;Constant;Aching    Pain Type  Acute pain    Pain Onset  More than a month ago    Pain Frequency  Intermittent                      [facility] Adult PT Treatment/Exercise - 11/10/17 0001      Exercises   Exercises  Shoulder      Shoulder Exercises: Supine   Flexion  AROM;20 reps      Shoulder Exercises: Sidelying   External Rotation  Left;20 reps;Weights    External Rotation Weight (lbs)  2lb      Shoulder Exercises: Standing   Extension  20 reps;Theraband    Theraband Level (Shoulder Extension)  Level 3 (Green)    Row  20 reps;Theraband    Theraband Level (Shoulder Row)  Level 3 (Green)      Modalities   Modalities  Ultrasound      Ultrasound   Ultrasound Location  L UT    Ultrasound Parameters  50%, 3mhz, x8 min       Manual Therapy   Manual Therapy  Soft tissue mobilization;Taping    Soft tissue mobilization  IASTM and manual STM to L upper trap    Kinesiotex  Inhibit Muscle      Kinesiotix   Inhibit Muscle   L UT inhibition              PT Education - 11/10/17 1142    Education provided  Yes    Education Details  HEP    Person(s) Educated  Patient    Methods  Explanation    Comprehension  Verbalized understanding       PT Short Term Goals - 10/30/17 1414      PT SHORT TERM GOAL #1   Title  Pt to report decreased pain in L upper trap region, to 4/10     Time  2    Period  Weeks    Status  New    Target Date  11/13/17        PT Long Term Goals - 10/30/17 1415      PT LONG TERM GOAL #1   Title  Pt to report decreased pain in L upper trap region to 0-2/10 with activity     Time  6    Period  Weeks    Status  New    Target Date  12/11/17      PT LONG TERM GOAL #2   Title  Pt to demo increased cervical ROM to be WNL, to improve ability for head turns while driving and jogging.     Time  6    Period  Weeks    Status  New    Target Date  12/11/17      PT LONG TERM GOAL #3   Title  Pt to demo decreased pain in L hip to 4/10 with running up to 3 miles     Time  6    Period  Weeks    Status  New    Target Date  12/11/17            Plan - 11/10/17 1143    Clinical Impression Statement  Pt with decreased soreness after session today. Manual therapy, IASTM, and k-tape done to decrease pain and trigger points at L UT. Started light strengthening for postural muscles and L shoulder, pt requires cuing to decrease activation of UT.     PT Treatment/Interventions  ADLs/Self Care Home Management;Cryotherapy;Electrical Stimulation;Iontophoresis 4mg /ml Dexamethasone;Moist Heat;Therapeutic exercise;Therapeutic activities;Functional mobility training;Stair training;Gait training;Ultrasound;Balance training;Neuromuscular re-education;Patient/family education;Orthotic  Fit/Training;Manual techniques;Taping;Dry needling;Passive range of motion    PT Next Visit Plan  Manual for neck; assess response to DN, progress per POC    Consulted and Agree with Plan of Care  Patient       Patient will benefit from skilled therapeutic intervention in order to improve the following deficits and impairments:  Abnormal gait, Decreased endurance, Hypomobility, Decreased activity tolerance, Decreased strength, Pain, Increased muscle spasms, Difficulty walking, Decreased mobility, Decreased balance, Decreased range of motion, Improper body mechanics, Impaired flexibility  Visit Diagnosis: Cervicalgia  Acute pain of left shoulder  Pain in left hip  Other abnormalities of gait and mobility     Problem List Patient Active Problem List   Diagnosis Date Noted  . Acute pain of right knee 08/03/2017  . Left shoulder pain 07/14/2017  . Lumbar spondylosis 06/20/2017  . Diverticulosis 06/20/2017  . Hemorrhoids, external 06/20/2017  . Muscle cramping 06/20/2017  . Strain of gluteus medius 01/29/2017  . Benign prostatic hyperplasia without urinary obstruction 06/17/2014  . Right inguinal hernia 06/17/2014  . Sacroiliac joint dysfunction of left side 04/05/2014  . Hip pain, left 02/23/2014  . Chronic hyperkalemia 05/20/2012  . Prostatitis 05/20/2012  . Ankle pain 05/19/2012  . Left ankle sprain 05/19/2012  . BPPV (benign paroxysmal positional vertigo) 07/01/2011  . Concussion syndrome 05/15/2011  . DEGENERATIVE JOINT DISEASE, HANDS 10/16/2009  . SHOULDER PAIN, RIGHT 08/04/2008  . Inguinal pain, right 08/04/2008  . NEUROPATHY 03/01/2008  . SYMPTOM, PAIN, ABDOMINAL, RIGHT UP QUADRANT 12/31/2006  . INJURY NOS, HIP/THIGH 12/31/2006   Lyndee Hensen, PT, DPT 11:46 AM  11/10/17    [facility] [place], [place], [address] Phone: [phone]   Fax:  [phone]  Name: William Underwood MRN: [id number] Date of  Birth: 1951-03-04

## 2017-11-13 ENCOUNTER — Ambulatory Visit (INDEPENDENT_AMBULATORY_CARE_PROVIDER_SITE_OTHER): Payer: PPO | Admitting: Physical Therapy

## 2017-11-13 ENCOUNTER — Encounter: Payer: Self-pay | Admitting: Physical Therapy

## 2017-11-13 DIAGNOSIS — M25552 Pain in left hip: Secondary | ICD-10-CM | POA: Diagnosis not present

## 2017-11-13 DIAGNOSIS — R2689 Other abnormalities of gait and mobility: Secondary | ICD-10-CM

## 2017-11-13 DIAGNOSIS — M542 Cervicalgia: Secondary | ICD-10-CM | POA: Diagnosis not present

## 2017-11-13 DIAGNOSIS — M25512 Pain in left shoulder: Secondary | ICD-10-CM

## 2017-11-13 NOTE — Therapy (Signed)
[facility] [address] [place], [place], [address] Phone: [phone]   Fax:  [phone]  Physical Therapy Treatment  Patient Details  Name: William Underwood MRN: [id number] Date of Birth: 1951-03-16 Referring Provider: [name]   Encounter Date: 11/13/2017  PT End of Session - 11/13/17 1055    Visit Number  4    Number of Visits  12    Date for PT Re-Evaluation  12/11/17    Authorization Type  HTA    PT Start Time  1015    PT Stop Time  1055    PT Time Calculation (min)  40 min    Activity Tolerance  Patient tolerated treatment well    Behavior During Therapy  [facility] for tasks assessed/performed       Past Medical History:  Diagnosis Date  . Prostatitis   . Vertigo     Past Surgical History:  Procedure Laterality Date  . ANKLE ARTHROSCOPY    . KNEE ARTHROSCOPY      There were no vitals filed for this visit.  Subjective Assessment - 11/13/17 1017    Subjective  felt much better after DN last week; soreness present but pain was different from initial pain. pain returned yesterday when running 8 miles.    Patient Stated Goals  Decreased pain                      [facility] Adult PT Treatment/Exercise - 11/13/17 1019      Knee/Hip Exercises: Aerobic   Recumbent Bike  L3 x 8 min      Manual Therapy   Manual Therapy  Soft tissue mobilization    Soft tissue mobilization  Lt upper trap; infraspinatus and levator scapula      Neck Exercises: Stretches   Upper Trapezius Stretch  2 reps;30 seconds    Levator Stretch  2 reps;30 seconds       Trigger Point Dry Needling - 11/13/17 1052    Consent Given?  Yes    Muscles Treated Upper Body  Upper trapezius;Levator scapulae;Infraspinatus    Upper Trapezius Response  Twitch reponse elicited;Palpable increased muscle length    Levator Scapulae Response  Twitch response elicited;Palpable increased muscle length    Infraspinatus Response  Twitch response  elicited;Palpable increased muscle length             PT Short Term Goals - 11/13/17 1056      PT SHORT TERM GOAL #1   Title  Pt to report decreased pain in L upper trap region, to 4/10     Status  On-going        PT Long Term Goals - 10/30/17 1415      PT LONG TERM GOAL #1   Title  Pt to report decreased pain in L upper trap region to 0-2/10 with activity     Time  6    Period  Weeks    Status  New    Target Date  12/11/17      PT LONG TERM GOAL #2   Title  Pt to demo increased cervical ROM to be WNL, to improve ability for head turns while driving and jogging.     Time  6    Period  Weeks    Status  New    Target Date  12/11/17      PT LONG TERM GOAL #3   Title  Pt to demo decreased pain in L hip to  4/10 with running up to 3 miles     Time  6    Period  Weeks    Status  New    Target Date  12/11/17            Plan - 11/13/17 1056    Clinical Impression Statement  Pt tolerated Dn well today with good twitch responses in all muscle groups.  Reports 6 days of improved symptoms which returned yesterday; and feel it is likely due to overuse and increased mileage with running.  Reviewed proper running form and advsed to focus on keeping shoulders down with running.      PT Treatment/Interventions  ADLs/Self Care Home Management;Cryotherapy;Electrical Stimulation;Iontophoresis 4mg /ml Dexamethasone;Moist Heat;Therapeutic exercise;Therapeutic activities;Functional mobility training;Stair training;Gait training;Ultrasound;Balance training;Neuromuscular re-education;Patient/family education;Orthotic Fit/Training;Manual techniques;Taping;Dry needling;Passive range of motion    PT Next Visit Plan  Manual for neck; assess response to DN, progress per POC    Consulted and Agree with Plan of Care  Patient       Patient will benefit from skilled therapeutic intervention in order to improve the following deficits and impairments:  Abnormal gait, Decreased endurance,  Hypomobility, Decreased activity tolerance, Decreased strength, Pain, Increased muscle spasms, Difficulty walking, Decreased mobility, Decreased balance, Decreased range of motion, Improper body mechanics, Impaired flexibility  Visit Diagnosis: Cervicalgia  Acute pain of left shoulder  Pain in left hip  Other abnormalities of gait and mobility     Problem List Patient Active Problem List   Diagnosis Date Noted  . Acute pain of right knee 08/03/2017  . Left shoulder pain 07/14/2017  . Lumbar spondylosis 06/20/2017  . Diverticulosis 06/20/2017  . Hemorrhoids, external 06/20/2017  . Muscle cramping 06/20/2017  . Strain of gluteus medius 01/29/2017  . Benign prostatic hyperplasia without urinary obstruction 06/17/2014  . Right inguinal hernia 06/17/2014  . Sacroiliac joint dysfunction of left side 04/05/2014  . Hip pain, left 02/23/2014  . Chronic hyperkalemia 05/20/2012  . Prostatitis 05/20/2012  . Ankle pain 05/19/2012  . Left ankle sprain 05/19/2012  . BPPV (benign paroxysmal positional vertigo) 07/01/2011  . Concussion syndrome 05/15/2011  . DEGENERATIVE JOINT DISEASE, HANDS 10/16/2009  . SHOULDER PAIN, RIGHT 08/04/2008  . Inguinal pain, right 08/04/2008  . NEUROPATHY 03/01/2008  . SYMPTOM, PAIN, ABDOMINAL, RIGHT UP QUADRANT 12/31/2006  . INJURY NOS, HIP/THIGH 12/31/2006      Laureen Abrahams, PT, DPT 11/13/17 10:58 AM    [facility] [place], [place], [address] Phone: [phone]   Fax:  [phone]  Name: William Underwood MRN: [id number] Date of Birth: 11-Mar-1951

## 2017-11-17 ENCOUNTER — Ambulatory Visit (INDEPENDENT_AMBULATORY_CARE_PROVIDER_SITE_OTHER): Payer: PPO | Admitting: Physical Therapy

## 2017-11-17 DIAGNOSIS — M542 Cervicalgia: Secondary | ICD-10-CM | POA: Diagnosis not present

## 2017-11-17 DIAGNOSIS — R2689 Other abnormalities of gait and mobility: Secondary | ICD-10-CM | POA: Diagnosis not present

## 2017-11-17 DIAGNOSIS — M25552 Pain in left hip: Secondary | ICD-10-CM

## 2017-11-17 DIAGNOSIS — M25512 Pain in left shoulder: Secondary | ICD-10-CM

## 2017-11-17 NOTE — Therapy (Signed)
[facility] [address] [place], [place], [address] Phone: [phone]   Fax:  [phone]  Physical Therapy Treatment  Patient Details  Name: William Underwood MRN: [id number] Date of Birth: 1951-04-27 Referring Provider: [name]   Encounter Date: 11/17/2017  PT End of Session - 11/17/17 1062    Visit Number  5    Number of Visits  12    Date for PT Re-Evaluation  12/11/17    Authorization Type  HTA    PT Start Time  1134    PT Stop Time  1215    PT Time Calculation (min)  41 min    Activity Tolerance  Patient tolerated treatment well    Behavior During Therapy  [facility] for tasks assessed/performed       Past Medical History:  Diagnosis Date  . Prostatitis   . Vertigo     Past Surgical History:  Procedure Laterality Date  . ANKLE ARTHROSCOPY    . KNEE ARTHROSCOPY      There were no vitals filed for this visit.  Subjective Assessment - 11/17/17 1302    Subjective  Pt states improvements after treatments, but increased soreness with increased running. Pt aware that temporarily decreasing running milage has been recommended .    Currently in Pain?  Yes    Pain Score  3     Pain Location  Shoulder    Pain Orientation  Left    Pain Descriptors / Indicators  Tightness;Aching    Pain Type  Acute pain    Pain Onset  More than a month ago    Pain Frequency  Intermittent    Aggravating Factors   running                       [facility] Adult PT Treatment/Exercise - 11/17/17 1139      Shoulder Exercises: Supine   Flexion  AROM;20 reps    Other Supine Exercises  SA punch x20, 2 lb      Shoulder Exercises: Prone   Flexion  20 reps    Horizontal ABduction 1  20 reps      Manual Therapy   Manual Therapy  Soft tissue mobilization    Soft tissue mobilization  IASTM L upper trap, STM cervical paraspinals and sub occiptial release     Kinesiotex  Inhibit Muscle      Kinesiotix   Inhibit Muscle   L UT inhibition        Neck Exercises: Stretches   Upper Trapezius Stretch  2 reps;30 seconds    Levator Stretch  2 reps;30 seconds             PT Education - 11/17/17 1304    Education provided  Yes    Education Details  Posture with running     Person(s) Educated  Patient    Methods  Explanation    Comprehension  Verbalized understanding       PT Short Term Goals - 11/13/17 1056      PT SHORT TERM GOAL #1   Title  Pt to report decreased pain in L upper trap region, to 4/10     Status  On-going        PT Long Term Goals - 10/30/17 1415      PT LONG TERM GOAL #1   Title  Pt to report decreased pain in L upper trap region to 0-2/10 with activity     Time  6    Period  Weeks    Status  New    Target Date  12/11/17      PT LONG TERM GOAL #2   Title  Pt to demo increased cervical ROM to be WNL, to improve ability for head turns while driving and jogging.     Time  6    Period  Weeks    Status  New    Target Date  12/11/17      PT LONG TERM GOAL #3   Title  Pt to demo decreased pain in L hip to 4/10 with running up to 3 miles     Time  6    Period  Weeks    Status  New    Target Date  12/11/17            Plan - 11/17/17 1306    Clinical Impression Statement  Pt with significant tightness in upper trap and levator. Lower and mid trap improved today. He has significant difficulty maintaining shoulder posture with UE activity. Scapular stability practiced with UE strengthening today, pt will benefit from continued work on this. Manual therapy and k-tape done to address muscle restrictions, pt to benefit from continued dry needling as well for trigger points.     PT Treatment/Interventions  ADLs/Self Care Home Management;Cryotherapy;Electrical Stimulation;Iontophoresis 4mg /ml Dexamethasone;Moist Heat;Therapeutic exercise;Therapeutic activities;Functional mobility training;Stair training;Gait training;Ultrasound;Balance training;Neuromuscular re-education;Patient/family  education;Orthotic Fit/Training;Manual techniques;Taping;Dry needling;Passive range of motion    PT Next Visit Plan  Manual for neck; assess response to DN, progress per POC    Consulted and Agree with Plan of Care  Patient       Patient will benefit from skilled therapeutic intervention in order to improve the following deficits and impairments:  Abnormal gait, Decreased endurance, Hypomobility, Decreased activity tolerance, Decreased strength, Pain, Increased muscle spasms, Difficulty walking, Decreased mobility, Decreased balance, Decreased range of motion, Improper body mechanics, Impaired flexibility  Visit Diagnosis: Cervicalgia  Acute pain of left shoulder  Pain in left hip  Other abnormalities of gait and mobility     Problem List Patient Active Problem List   Diagnosis Date Noted  . Acute pain of right knee 08/03/2017  . Left shoulder pain 07/14/2017  . Lumbar spondylosis 06/20/2017  . Diverticulosis 06/20/2017  . Hemorrhoids, external 06/20/2017  . Muscle cramping 06/20/2017  . Strain of gluteus medius 01/29/2017  . Benign prostatic hyperplasia without urinary obstruction 06/17/2014  . Right inguinal hernia 06/17/2014  . Sacroiliac joint dysfunction of left side 04/05/2014  . Hip pain, left 02/23/2014  . Chronic hyperkalemia 05/20/2012  . Prostatitis 05/20/2012  . Ankle pain 05/19/2012  . Left ankle sprain 05/19/2012  . BPPV (benign paroxysmal positional vertigo) 07/01/2011  . Concussion syndrome 05/15/2011  . DEGENERATIVE JOINT DISEASE, HANDS 10/16/2009  . SHOULDER PAIN, RIGHT 08/04/2008  . Inguinal pain, right 08/04/2008  . NEUROPATHY 03/01/2008  . SYMPTOM, PAIN, ABDOMINAL, RIGHT UP QUADRANT 12/31/2006  . INJURY NOS, HIP/THIGH 12/31/2006   William Underwood, PT, DPT 1:13 PM  11/17/17    [facility] [place], [place], [address] Phone: [phone]   Fax:  [phone]  Name: William Underwood MRN:  [id number] Date of Birth: 1951-03-20

## 2017-11-20 ENCOUNTER — Encounter: Payer: Self-pay | Admitting: Physical Therapy

## 2017-11-20 ENCOUNTER — Ambulatory Visit (INDEPENDENT_AMBULATORY_CARE_PROVIDER_SITE_OTHER): Payer: PPO | Admitting: Physical Therapy

## 2017-11-20 DIAGNOSIS — M25512 Pain in left shoulder: Secondary | ICD-10-CM | POA: Diagnosis not present

## 2017-11-20 DIAGNOSIS — M542 Cervicalgia: Secondary | ICD-10-CM

## 2017-11-20 NOTE — Procedures (Signed)
Mr. Funari is a 67 year old gentleman [name] for left L4 transforaminal epidural steroid injection.  Based on the patient's symptoms of left hip pain radiating to the left leg he has symptoms consistent with an L4-L5 radiculopathy.  This is been ongoing since March 2018 is really not been amenable to conservative care which is been effectively prescribed by Dr. [name].  I think given the MRI findings left L4 transforaminal injection is appropriate.  Depending on his relief would look at an L5 transforaminal injection versus facet joint block.  The injection  will be diagnostic and hopefully therapeutic. The patient has failed conservative care including time, medications and activity modification.  Lumbosacral Transforaminal Epidural Steroid Injection - Sub-Pedicular Approach with Fluoroscopic Guidance  Patient: William Underwood      Date of Birth: Jan 20, 1951 MRN: [id number] PCP: Briscoe Deutscher, DO      Visit Date: 11/05/2017   Universal Protocol:    Date/Time: 11/05/2017  Consent Given By: the patient  Position: PRONE  Additional Comments: Vital signs were monitored before and after the procedure. Patient was prepped and draped in the usual sterile fashion. The correct patient, procedure, and site was verified.   Injection Procedure Details:  Procedure Site One Meds Administered:  Meds ordered this encounter  Medications  . betamethasone acetate-betamethasone sodium phosphate (CELESTONE) injection 12 mg    Laterality: Left  Location/Site:  L4-L5  Needle size: 22 G  Needle type: Spinal  Needle Placement: Transforaminal  Findings:    -Comments: Excellent flow of contrast along the nerve and into the epidural space.  Procedure Details: After squaring off the end-plates to get a true AP view, the C-arm was positioned so that an oblique view of the foramen as noted above was visualized. The target area is just inferior to the "nose of the scotty dog" or sub pedicular. The soft  tissues overlying this structure were infiltrated with 2-3 ml. of 1% Lidocaine without Epinephrine.  The spinal needle was inserted toward the target using a "trajectory" view along the fluoroscope beam.  Under AP and lateral visualization, the needle was advanced so it did not puncture dura and was located close the 6 O'Clock position of the pedical in AP tracterory. Biplanar projections were used to confirm position. Aspiration was confirmed to be negative for CSF and/or blood. A 1-2 ml. volume of Isovue-250 was injected and flow of contrast was noted at each level. Radiographs were obtained for documentation purposes.   After attaining the desired flow of contrast documented above, a 0.5 to 1.0 ml test dose of 0.25% Marcaine was injected into each respective transforaminal space.  The patient was observed for 90 seconds post injection.  After no sensory deficits were reported, and normal lower extremity motor function was noted,   the above injectate was administered so that equal amounts of the injectate were placed at each foramen (level) into the transforaminal epidural space.   Additional Comments:  The patient tolerated the procedure well Dressing: Band-Aid    Post-procedure details: Patient was observed during the procedure. Post-procedure instructions were reviewed.  Patient left the clinic in stable condition.   Pertinent Imaging: MRI LUMBAR SPINE WITHOUT CONTRAST COMPARISON:  Plain films of the lumbar spine 06/01/2017.  FINDINGS: Segmentation:  Standard.  Alignment:  0.2 cm retrolisthesis L2 on L3 is noted.  Vertebrae: No fracture or worrisome lesion. Degenerative endplate signal change is seen at L5-S1 and anteriorly at L2-3. Small hemangioma in T11 is noted.  Conus medullaris and cauda equina: Conus extends  to the L1 level. Conus and cauda equina appear normal.  Paraspinal and other soft tissues: Negative.  Disc levels:  T11-12 is imaged in the sagittal  plane only and negative.  T12-L1:  Negative.  L1-2:  Negative.  L2-3: Shallow disc bulge causes minimal narrowing in the left subarticular recess. The thecal sac and foramina are widely patent.  L3-4:  Minimal disc bulge without stenosis.  L4-5: Moderate to advanced facet degenerative disease is worse on the left where there is a small associated joint effusion. The patient has a shallow broad-based central protrusion and ligamentum flavum thickening. There is moderate central canal stenosis and left much worse than right subarticular recess narrowing which could impact either descending L5 root. Disc and facet arthropathy cause moderately severe left foraminal narrowing. Mild right foraminal narrowing is also seen.  L5-S1: Shallow broad-based disc bulge without central canal stenosis. The left foramen is open. Moderate right foraminal narrowing is seen.  IMPRESSION: Lumbar spondylosis worst at L4-5 where there is moderate central canal stenosis and left much worse than right subarticular recess narrowing which could impact either descending L5 root. Mild right and moderately severe left foraminal narrowing is also present at this level.  Shallow disc bulge at L2-3 causes minimal narrowing in the left subarticular recess.  Moderate right foraminal narrowing L5-S1.   Electronically Signed   By: Inge Rise M.D.   On: 10/17/2017 13:12

## 2017-11-20 NOTE — Therapy (Addendum)
[facility] [address] [place], [place], [address] Phone: [phone]   Fax:  [phone]  Physical Therapy Treatment/Discharge  Patient Details  Name: William Underwood MRN: [id number] Date of Birth: [birth year] Referring Provider: [name]   Encounter Date: 11/20/2017  PT End of Session - 11/20/17 1055    Number of Visits  12    Date for PT Re-Evaluation  12/11/17    Authorization Type  HTA    PT Start Time  1015    PT Stop Time  1054    PT Time Calculation (min)  39 min    Activity Tolerance  Patient tolerated treatment well    Behavior During Therapy  [facility] for tasks assessed/performed       Past Medical History:  Diagnosis Date  . Prostatitis   . Vertigo     Past Surgical History:  Procedure Laterality Date  . ANKLE ARTHROSCOPY    . KNEE ARTHROSCOPY      There were no vitals filed for this visit.  Subjective Assessment - 11/20/17 1016    Subjective  feels like he's getting better; but then went for a run for 8 miles and it was torture.  short term relief with return of tightness 4-6 days later.      Patient Stated Goals  Decreased pain    Currently in Pain?  Yes    Pain Score  4     Pain Location  Shoulder    Pain Orientation  Left    Pain Type  Acute pain    Pain Onset  More than a month ago    Pain Frequency  Intermittent    Aggravating Factors   running    Pain Relieving Factors  rest                      [facility] Adult PT Treatment/Exercise - 11/20/17 1018      Knee/Hip Exercises: Aerobic   Recumbent Bike  L3 x 8 min      Manual Therapy   Manual Therapy  Soft tissue mobilization    Soft tissue mobilization  IASTM L upper trap, STM cervical paraspinals and sub occiptial release        Trigger Point Dry Needling - 11/20/17 1054    Consent Given?  Yes    Muscles Treated Upper Body  Upper trapezius;Sternocleidomastoid;Levator scapulae    Sternocleidomastoid Response  Twitch response  elicited;Palpable increased muscle length    Upper Trapezius Response  Twitch reponse elicited;Palpable increased muscle length    Levator Scapulae Response  Twitch response elicited;Palpable increased muscle length             PT Short Term Goals - 11/13/17 1056      PT SHORT TERM GOAL #1   Title  Pt to report decreased pain in L upper trap region, to 4/10     Status  On-going        PT Long Term Goals - 10/30/17 1415      PT LONG TERM GOAL #1   Title  Pt to report decreased pain in L upper trap region to 0-2/10 with activity     Time  6    Period  Weeks    Status  New    Target Date  12/11/17      PT LONG TERM GOAL #2   Title  Pt to demo increased cervical ROM to be WNL, to improve ability for head turns while  driving and jogging.     Time  6    Period  Weeks    Status  New    Target Date  12/11/17      PT LONG TERM GOAL #3   Title  Pt to demo decreased pain in L hip to 4/10 with running up to 3 miles     Time  6    Period  Weeks    Status  New    Target Date  12/11/17            Plan - 11/20/17 1055    Clinical Impression Statement  PT continues to have return of spasms and pain approx 4-6 days following DN, but also reports he hasn't decreased mileage with running at all.  Pt educated on PT recommendations to at least decrease mileage but at this time unwilling.  Progress with PT affected by no activity modifications.    PT Treatment/Interventions  ADLs/Self Care Home Management;Cryotherapy;Electrical Stimulation;Iontophoresis 36m/ml Dexamethasone;Moist Heat;Therapeutic exercise;Therapeutic activities;Functional mobility training;Stair training;Gait training;Ultrasound;Balance training;Neuromuscular re-education;Patient/family education;Orthotic Fit/Training;Manual techniques;Taping;Dry needling;Passive range of motion    PT Next Visit Plan  Manual for neck; assess response to DN, progress per POC    Consulted and Agree with Plan of Care  Patient        Patient will benefit from skilled therapeutic intervention in order to improve the following deficits and impairments:  Abnormal gait, Decreased endurance, Hypomobility, Decreased activity tolerance, Decreased strength, Pain, Increased muscle spasms, Difficulty walking, Decreased mobility, Decreased balance, Decreased range of motion, Improper body mechanics, Impaired flexibility  Visit Diagnosis: Cervicalgia  Acute pain of left shoulder     Problem List Patient Active Problem List   Diagnosis Date Noted  . Acute pain of right knee 08/03/2017  . Left shoulder pain 07/14/2017  . Lumbar spondylosis 06/20/2017  . Diverticulosis 06/20/2017  . Hemorrhoids, external 06/20/2017  . Muscle cramping 06/20/2017  . Strain of gluteus medius 01/29/2017  . Benign prostatic hyperplasia without urinary obstruction 06/17/2014  . Right inguinal hernia 06/17/2014  . Sacroiliac joint dysfunction of left side 04/05/2014  . Hip pain, left 02/23/2014  . Chronic hyperkalemia 05/20/2012  . Prostatitis 05/20/2012  . Ankle pain 05/19/2012  . Left ankle sprain 05/19/2012  . BPPV (benign paroxysmal positional vertigo) 07/01/2011  . Concussion syndrome 05/15/2011  . DEGENERATIVE JOINT DISEASE, HANDS 10/16/2009  . SHOULDER PAIN, RIGHT 08/04/2008  . Inguinal pain, right 08/04/2008  . NEUROPATHY 03/01/2008  . SYMPTOM, PAIN, ABDOMINAL, RIGHT UP QUADRANT 12/31/2006  . INJURY NOS, HIP/THIGH 12/31/2006      SLaureen Abrahams PT, DPT 11/20/17 10:57 AM    [place] [address]Phone: [phone]  Fax:  [phone] Name: William PENKALAMRN: [id number] of Birth: 111-19-1952     PHYSICAL THERAPY DISCHARGE SUMMARY  Visits from Start of Care: 12  Current functional level related to goals / functional outcomes: See above   Remaining deficits: See above; pt did not return to formally assess goals    Education / Equipment: HEP, need to modify activities in order to see most progress with PT (pt unwilling at last visit)  Plan: Patient agrees to discharge.  Patient goals were not met. Patient is being discharged due to being pleased with the current functional level.  ?????     SLaureen Abrahams PT, DPT 02/02/18 3:56 PM   [address]  [place], [address] Phone: [phone]  Fax: [phone]

## 2017-12-04 ENCOUNTER — Encounter: Payer: Self-pay | Admitting: Sports Medicine

## 2017-12-04 ENCOUNTER — Ambulatory Visit (INDEPENDENT_AMBULATORY_CARE_PROVIDER_SITE_OTHER): Payer: PPO | Admitting: Sports Medicine

## 2017-12-04 VITALS — BP 128/68 | HR 54 | Ht 67.0 in | Wt 146.8 lb

## 2017-12-04 DIAGNOSIS — M5126 Other intervertebral disc displacement, lumbar region: Secondary | ICD-10-CM | POA: Diagnosis not present

## 2017-12-04 DIAGNOSIS — M533 Sacrococcygeal disorders, not elsewhere classified: Secondary | ICD-10-CM

## 2017-12-04 DIAGNOSIS — S76012A Strain of muscle, fascia and tendon of left hip, initial encounter: Secondary | ICD-10-CM

## 2017-12-04 DIAGNOSIS — M47816 Spondylosis without myelopathy or radiculopathy, lumbar region: Secondary | ICD-10-CM | POA: Diagnosis not present

## 2017-12-04 NOTE — Progress Notes (Signed)
[name]. [place], [place] at [place]  William Underwood - 67 y.o. male MRN [id number]  Date of birth: 16-May-1951  Visit Date: 12/04/2017  PCP: Briscoe Deutscher, DO   Referred by: Briscoe Deutscher, DO   Scribe for today's visit: Wendy Poet, LAT, ATC     SUBJECTIVE:  William Underwood is here for Follow-up (LBP and cervicalgia) .    Compared to the last office visit on 10/23/17, his previously described LBP and neck/L shoulder pain symptoms are improving w/ decreased pain in his neck/L shoulder and decreased L hip pain. Current symptoms are mod in the L shoulder (pain decreases throughout the day) and hip pain is mild-mod & are nonradiating He has had 3 rounds of dry needling to his L shoulder and neck.  He has completed PT at this time.  He also had an epidural on 11/05/17 and states that it completely eliminated his L SIJ w/i 24 hours.  He has stopped taking Gabapentin and is no longer using the nitrolgycerin patches.  Has been doing 18-20 mile training runs in preparation for his marathon on December 26, 2017.   ROS Denies night time disturbances. Denies fevers, chills, or night sweats. Denies unexplained weight loss. Denies personal history of cancer. Denies changes in bowel or bladder habits. Denies recent unreported falls. Denies new or worsening dyspnea or wheezing. Denies headaches or dizziness.  Denies numbness, tingling or weakness  In the extremities.  Denies dizziness or presyncopal episodes Denies lower extremity edema    .  HISTORY & PERTINENT PRIOR DATA:  Prior History reviewed and updated per electronic medical record.  Significant/pertinent history, findings, studies include:  reports that he has never smoked. He has never used smokeless tobacco. No results for input(s): HGBA1C, LABURIC, CREATINE in the last 8760 hours. No specialty comments available. No problems updated.  OBJECTIVE:  VS:  HT:5\' 7"   (170.2 cm)   WT:146 lb 12.8 oz (66.6 kg)  BMI:22.99    BP:128/68  HR:(Abnormal) 54bpm  TEMP: ( )  RESP:95 %   PHYSICAL EXAM: Constitutional: WDWN, Non-toxic appearing. Psychiatric: Alert & appropriately interactive.  Not depressed or anxious appearing. Respiratory: No increased work of breathing.  Trachea Midline Eyes: Pupils are equal.  EOM intact without nystagmus.  No scleral icterus  Vascular Exam: warm to touch no edema  lower extremity neuro exam: unremarkable normal sensation normal reflexes  MSK Exam: Negative straight leg raise today but does have tightness on the left compared to the right.  Glute medius is improved in its function.   ASSESSMENT & PLAN:   1. Herniated intervertebral disc of lumbar spine   2. Lumbar spondylosis   3. Strain of gluteus medius of left lower extremity, initial encounter   4. Sacroiliac joint dysfunction of left side     PLAN:>50% of this 25 minute visit spent in direct patient counseling and/or coordination of care.  Discussion was focused on education regarding the in discussing the pathoetiology and anticipated clinical course of the above condition.  We did discuss that ongoing therapeutic exercises will be necessary to mitigate the long-term changes that he has had.  He does have some diminished strength as well as significant anterior chain dominance and this will need to be continually worked on.  He will follow-up in 3 months unless any significant worsening.  Discussed appropriate return to activities.  Follow-up: Return in about 3 months (around 03/03/2018).      Please see  additional documentation for Objective, Assessment and Plan sections. Pertinent additional documentation may be included in corresponding procedure notes, imaging studies, problem based documentation and patient instructions. Please see these sections of the encounter for additional information regarding this visit.  CMA/ATC served as Education administrator during this visit.  History, Physical, and Plan performed by medical provider. Documentation and orders reviewed and attested to.      Gerda Diss, [facility] Physician

## 2017-12-18 ENCOUNTER — Ambulatory Visit (INDEPENDENT_AMBULATORY_CARE_PROVIDER_SITE_OTHER): Payer: PPO | Admitting: Sports Medicine

## 2017-12-18 ENCOUNTER — Ambulatory Visit: Payer: Self-pay

## 2017-12-18 ENCOUNTER — Encounter: Payer: Self-pay | Admitting: Sports Medicine

## 2017-12-18 VITALS — BP 130/64 | HR 56 | Wt 148.6 lb

## 2017-12-18 DIAGNOSIS — M25562 Pain in left knee: Secondary | ICD-10-CM

## 2017-12-18 NOTE — Progress Notes (Signed)
William Underwood, [place] at [place]  SHAWNTE DEMAREST - 67 y.o. male MRN [id number]  Date of birth: October 11, 1951  Visit Date: 12/18/2017  PCP: Briscoe Deutscher, DO   Referred by: Briscoe Deutscher, DO   Scribe for today's visit: [name], CMA     SUBJECTIVE:  William Underwood is here for Initial Assessment (L knee pain) .  Referred by: Dr. Briscoe Deutscher His L knee pain symptoms INITIALLY: Began 2 weeks ago and hurst more with movement and running Described as mild when setting and can get higher when moving, nonradiating Worsened with running and walking. Improved with ice and elevation.  Additional associated symptoms include: none     He does recall tripping several weeks ago prior to the onset of the pain and thinks he may have tweaked his knee but he is unsure.  The pain does seem to be worsening especially with increasing his mileage.   ROS Denies night time disturbances. Denies fevers, chills, or night sweats. Denies unexplained weight loss. Denies personal history of cancer. Denies changes in bowel or bladder habits. Denies recent unreported falls. Denies new or worsening dyspnea or wheezing. Denies headaches or dizziness.  Denies numbness, tingling or weakness  In the extremities.  Denies dizziness or presyncopal episodes Denies lower extremity edema     HISTORY & PERTINENT PRIOR DATA:  Prior History reviewed and updated per electronic medical record.  Significant history, findings, studies and interim changes include:  reports that  has never smoked. he has never used smokeless tobacco. No results for input(s): HGBA1C, LABURIC, CREATINE in the last 8760 hours. No specialty comments available. Problem  Acute Pain of Left Knee    OBJECTIVE:  VS:  HT:    WT:148 lb 9.6 oz (67.4 kg)  BMI:23.27    BP:130/64  HR:(!) 56bpm  TEMP: ( )  RESP:96 %   PHYSICAL EXAM: Constitutional: WDWN, Non-toxic  appearing. Psychiatric: Alert & appropriately interactive.  Not depressed or anxious appearing. Respiratory: No increased work of breathing.  Trachea Midline Eyes: Pupils are equal.  EOM intact without nystagmus.  No scleral icterus  NEUROVASCULAR exam: No clubbing or cyanosis appreciated No significant venous stasis changes Capillary Refill: normal, less than 2 seconds   Left knee is overall well aligned with a small amount of swelling but minimal.  Pain is most likely over the medial joint line.  He has pain with McMurray's.  No significant locking or giving way.  Range of motion is full.  Extensor mechanism strength is intact.  No significant pain with hip abduction testing.   ASSESSMENT & PLAN:   1. Acute pain of left knee    ++++++++++++++++++++++++++++++++++++++++++++ Orders & Meds:  Orders Placed This Encounter  Procedures  . [place] MSK POCT ULTRASOUND   No orders of the defined types were placed in this encounter.   ++++++++++++++++++++++++++++++++++++++++++++ PLAN:   Findings:  Given the findings on ultrasound today we will plan to go ahead and inject his knee under ultrasound guidance into the left para meniscal cyst.  If any lack of improvement further diagnostic evaluation can be considered.  He does have an upcoming marathon that he is trying to prepare for the next 2 weeks and I recommended relative rest in the interim.  He does understand that continued running on this will likely worsen it however this time the tapering aspect of his restraining is an full effect and he will likely be feeling good  prior to the event and plans to run.  Will call us if/when things flareup.   No problem-specific Assessment & Plan notes found for this encounter.   Follow-up: Return if symptoms worsen or fail to improve.   Pertinent documentation may be included in additional procedure notes, imaging studies, problem based documentation and patient instructions. Please see these sections of  the encounter for additional information regarding this visit. CMA/ATC served as Education administrator during this visit. History, Physical, and Plan performed by medical provider. Documentation and orders reviewed and attested to.      Gerda Diss, [facility] Physician

## 2017-12-18 NOTE — Patient Instructions (Signed)

## 2017-12-18 NOTE — Procedures (Signed)
PROCEDURE NOTE:  Ultrasound Guided: Injection: Right medial parameniscal cyst Images were obtained and interpreted by myself, Teresa Coombs, DO  Images have been saved and stored to PACS system. Images obtained on: GE S7 Ultrasound machine  ULTRASOUND FINDINGS:  Moderate degree of swelling just adjacent to the medial knee directly off of and inferior to the medial meniscus.  He has a small effusion only and no significant Baker's cyst.  Consistent with a para meniscal cyst.  DESCRIPTION OF PROCEDURE:  The patient's clinical condition is marked by substantial pain and/or significant functional disability. Other conservative therapy has not provided relief, is contraindicated, or not appropriate. There is a reasonable likelihood that injection will significantly improve the patient's pain and/or functional impairment.  After discussing the risks, benefits and expected outcomes of the injection and all questions were reviewed and answered, the patient wished to undergo the above named procedure. Verbal consent was obtained.  The ultrasound was used to identify the target structure and adjacent neurovascular structures. The skin was then prepped in sterile fashion and the target structure was injected under direct visualization using sterile technique as below:   PREP: Alcohol, Ethel Chloride,  APPROACH: medial, single injection, 25g 1.5in. INJECTATE: 2cc: 0.5% marcaine, 2cc: 40mg /mL DepoMedrol   ASPIRATE: None   DRESSING: Band-Aid   Post procedural instructions including recommending icing and warning signs for infection were reviewed.   This procedure was well tolerated and there were no complications.     IMPRESSION: Succesful Ultrasound Guided: Injection

## 2017-12-25 ENCOUNTER — Encounter: Payer: Self-pay | Admitting: Sports Medicine

## 2017-12-25 DIAGNOSIS — M25562 Pain in left knee: Secondary | ICD-10-CM | POA: Insufficient documentation

## 2017-12-29 ENCOUNTER — Ambulatory Visit (INDEPENDENT_AMBULATORY_CARE_PROVIDER_SITE_OTHER): Payer: PPO

## 2017-12-29 ENCOUNTER — Ambulatory Visit (INDEPENDENT_AMBULATORY_CARE_PROVIDER_SITE_OTHER): Payer: PPO | Admitting: Family Medicine

## 2017-12-29 ENCOUNTER — Encounter: Payer: Self-pay | Admitting: Family Medicine

## 2017-12-29 VITALS — BP 120/60 | HR 49 | Temp 97.7°F | Wt 146.6 lb

## 2017-12-29 DIAGNOSIS — R05 Cough: Secondary | ICD-10-CM

## 2017-12-29 DIAGNOSIS — J4521 Mild intermittent asthma with (acute) exacerbation: Secondary | ICD-10-CM | POA: Diagnosis not present

## 2017-12-29 DIAGNOSIS — R059 Cough, unspecified: Secondary | ICD-10-CM

## 2017-12-29 MED ORDER — METHYLPREDNISOLONE ACETATE 80 MG/ML IJ SUSP
80.0000 mg | Freq: Once | INTRAMUSCULAR | Status: AC
  Administered 2017-12-29: 80 mg via INTRAMUSCULAR

## 2017-12-29 MED ORDER — BUDESONIDE-FORMOTEROL FUMARATE 160-4.5 MCG/ACT IN AERO: 2.0000 | INHALATION_SPRAY | Freq: Two times a day (BID) | RESPIRATORY_TRACT | 3 refills | Status: DC

## 2017-12-29 MED ORDER — AZITHROMYCIN 500 MG PO TABS: 500.0000 mg | ORAL_TABLET | Freq: Every day | ORAL | 0 refills | Status: DC

## 2017-12-29 NOTE — Progress Notes (Signed)
William Underwood is a 67 y.o. male here for an acute visit.  History of Present Illness:   William Underwood, CMA acting as scribe for Dr. Briscoe Deutscher.   Cough  This is a recurrent problem. The current episode started 1 to 4 weeks ago. The problem has been unchanged. The cough is productive of sputum. Associated symptoms include chest pain, nasal congestion and postnasal drip. Pertinent negatives include no chills, ear congestion, ear pain, fever, headaches, heartburn or myalgias. Associated symptoms comments: Burning . The symptoms are aggravated by exercise. The treatment provided no relief. His past medical history is significant for asthma.   PMHx, SurgHx, SocialHx, Medications, and Allergies were reviewed in the Visit Navigator and updated as appropriate.  Review of Systems  Constitutional: Negative for chills and fever.  HENT: Positive for postnasal drip. Negative for ear pain.   Eyes: Negative for blurred vision and double vision.  Respiratory: Positive for cough.   Cardiovascular: Positive for chest pain.  Gastrointestinal: Negative for heartburn and nausea.  Genitourinary: Negative for dysuria and urgency.  Musculoskeletal: Negative for myalgias.  Neurological: Negative for dizziness and headaches.  Endo/Heme/Allergies: Does not bruise/bleed easily.   Current Medications:   .  Ascorbic Acid (VITAMIN C) 1000 MG tablet, Take 1,000 mg by mouth daily., Disp: , Rfl:  .  Cyanocobalamin (B-12 PO), Take 1 tablet by mouth daily., Disp: , Rfl:  .  diazepam (VALIUM) 5 MG tablet, Take 1 tablet (5 mg total) by mouth daily as needed for anxiety., Disp: 30 tablet, Rfl: 1 .  Multiple Vitamins-Minerals (ZINC PO), Take 1 tablet by mouth daily., Disp: , Rfl:  .  nitroGLYCERIN (NITRODUR - DOSED IN MG/24 HR) 0.2 mg/hr patch, Place 1/4 of patch over affected region. Remove and replace once daily.  Slightly alter skin placement daily (Patient taking differently: as needed. Place 1/4 of patch over  affected region. Remove and replace once daily.  Slightly alter skin placement daily), Disp: 30 patch, Rfl: 1 .  Vitamin D, Ergocalciferol, 2000 units CAPS, Take 2,000 Units by mouth., Disp: , Rfl:    Allergies  Allergen Reactions  . Codeine     agitation  . Cefdinir Palpitations   Review of Systems:   Pertinent items are noted in the HPI. Otherwise, ROS is negative.  Vitals:   Vitals:   12/29/17 1003  BP: 120/60  Pulse: (!) 49  Temp: 97.7 F (36.5 C)  TempSrc: Oral  SpO2: 97%  Weight: 146 lb 9.6 oz (66.5 kg)     Body mass index is 22.96 kg/m.  Physical Exam:   Physical Exam  Constitutional: He is oriented to person, place, and time. He appears well-developed and well-nourished. No distress.  HENT:  Head: Normocephalic and atraumatic.  Right Ear: External ear normal.  Left Ear: External ear normal.  Nose: Nose normal.  Mouth/Throat: Oropharynx is clear and moist.  Eyes: Conjunctivae and EOM are normal. Pupils are equal, round, and reactive to light.  Neck: Normal range of motion. Neck supple.  Cardiovascular: Normal rate, regular rhythm, normal heart sounds and intact distal pulses.  Pulmonary/Chest: Effort normal and breath sounds normal.  Abdominal: Soft. Bowel sounds are normal.  Musculoskeletal: Normal range of motion.  Neurological: He is alert and oriented to person, place, and time.  Skin: Skin is warm and dry.  Psychiatric: He has a normal mood and affect. His behavior is normal. Judgment and thought content normal.  Nursing note and vitals reviewed.   Assessment and Plan:  1. Cough - DG Chest 2 View - azithromycin (ZITHROMAX) 500 MG tablet; Take 1 tablet (500 mg total) by mouth daily.  Dispense: 3 tablet; Refill: 0  2. Mild intermittent reactive airway disease with acute exacerbation - budesonide-formoterol (SYMBICORT) 160-4.5 MCG/ACT inhaler; Inhale 2 puffs into the lungs 2 (two) times daily.  Dispense: 1 Inhaler; Refill: 3   . Reviewed  expectations re: course of current medical issues. . Discussed self-management of symptoms. . Outlined signs and symptoms indicating need for more acute intervention. . Patient verbalized understanding and all questions were answered. Marland Kitchen Health Maintenance issues including appropriate healthy diet, exercise, and smoking avoidance were discussed with patient. . See orders for this visit as documented in the electronic medical record. . Patient received an After Visit Summary.  CMA served as Education administrator during this visit. History, Physical, and Plan performed by medical provider. The above documentation has been reviewed and is accurate and complete. Briscoe Deutscher, D.O.   Briscoe Deutscher, DO [place], [facility] 12/29/2017

## 2018-01-01 ENCOUNTER — Encounter: Payer: Self-pay | Admitting: Sports Medicine

## 2018-01-02 ENCOUNTER — Encounter: Payer: Self-pay | Admitting: Family Medicine

## 2018-02-22 ENCOUNTER — Emergency Department (HOSPITAL_COMMUNITY): Payer: PPO

## 2018-02-22 ENCOUNTER — Encounter (HOSPITAL_COMMUNITY): Payer: Self-pay | Admitting: Emergency Medicine

## 2018-02-22 ENCOUNTER — Emergency Department (HOSPITAL_COMMUNITY)
Admission: EM | Admit: 2018-02-22 | Discharge: 2018-02-22 | Disposition: A | Discharge status: Home / Self Care | Payer: PPO | Attending: Emergency Medicine | Admitting: Emergency Medicine

## 2018-02-22 DIAGNOSIS — I4891 Unspecified atrial fibrillation: Secondary | ICD-10-CM | POA: Insufficient documentation

## 2018-02-22 DIAGNOSIS — Z79899 Other long term (current) drug therapy: Secondary | ICD-10-CM | POA: Insufficient documentation

## 2018-02-22 DIAGNOSIS — R0789 Other chest pain: Secondary | ICD-10-CM | POA: Diagnosis not present

## 2018-02-22 DIAGNOSIS — R5383 Other fatigue: Secondary | ICD-10-CM | POA: Diagnosis not present

## 2018-02-22 DIAGNOSIS — R002 Palpitations: Secondary | ICD-10-CM | POA: Diagnosis not present

## 2018-02-22 LAB — CBC
HEMATOCRIT: 46.3 % (ref 39.0–52.0)
HEMOGLOBIN: 15.5 g/dL (ref 13.0–17.0)
MCH: 30.6 pg (ref 26.0–34.0)
MCHC: 33.5 g/dL (ref 30.0–36.0)
MCV: 91.5 fL (ref 78.0–100.0)
Platelets: 224 10*3/uL (ref 150–400)
RBC: 5.06 MIL/uL (ref 4.22–5.81)
RDW: 13.4 % (ref 11.5–15.5)
WBC: 9.8 10*3/uL (ref 4.0–10.5)

## 2018-02-22 LAB — BASIC METABOLIC PANEL
ANION GAP: 8 (ref 5–15)
BUN: 20 mg/dL (ref 6–20)
CALCIUM: 9.8 mg/dL (ref 8.9–10.3)
CO2: 27 mmol/L (ref 22–32)
Chloride: 104 mmol/L (ref 101–111)
Creatinine, Ser: 0.87 mg/dL (ref 0.61–1.24)
GFR calc Af Amer: >60 mL/min (ref >60)
GLUCOSE: 90 mg/dL (ref 65–99)
POTASSIUM: 5.1 mmol/L (ref 3.5–5.1)
Sodium: 139 mmol/L (ref 135–145)

## 2018-02-22 LAB — I-STAT TROPONIN, ED: Troponin i, poc: 0 ng/mL (ref 0.00–0.08)

## 2018-02-22 NOTE — ED Notes (Signed)
Pt had came to nurse first frustrated about wait and wanted to leave.  This nurse took pt out of system.  Pt came back to nurse first and stated he did want to be seen, and vented he was just frustrated.  This nurse apologized for wait and advised he was next to get room, room became available and was placed in room.

## 2018-02-22 NOTE — ED Triage Notes (Addendum)
Pt has device that allows him to do ekgs and view them on his phone, states that all of the ekgs he has done say a fib. Pt also reports he is very active but recently has felt more sluggish and at times felt like his heart was "fluttering." pt denies pain but reports feeling "full", no sob, nad.

## 2018-02-22 NOTE — ED Provider Notes (Signed)
[facility] EMERGENCY DEPARTMENT Provider Note   CSN: [id number] Arrival date & time: 02/22/18  0945     History   Chief Complaint Chief Complaint  Patient presents with  . Atrial Fibrillation    HPI William Underwood is a 67 y.o. male.  The history is provided by the patient and medical records. No language interpreter was used.   William Underwood is a 67 y.o. male  with a PMH as listed below who presents to the Emergency Department complaining of feeling of fluttering sensation in his chest intermittently over the last month or 2.  He states that he is an avid runner and biker.  He typically can complete a 50 to 60 mile bike ride without any difficulty.  Over the last couple of weeks, he is gotten fatigued halfway through does not feel that he complete his typical workouts with the same amount of energy.  He denies any chest pain, just the fluttering sensation.  He bought a machine that will allow him to do an EKG at home and all of them say A. fib.  This worried him, prompting him to come to the emergency department today.  He has no history of hypertension, hyperlipidemia diabetes, valvular disease.  He is not a smoker.  He is not on any medications.    Past Medical History:  Diagnosis Date  . Prostatitis   . Vertigo     Patient Active Problem List   Diagnosis Date Noted  . Acute pain of left knee 12/25/2017  . Acute pain of right knee 08/03/2017  . Left shoulder pain 07/14/2017  . Lumbar spondylosis 06/20/2017  . Diverticulosis 06/20/2017  . Hemorrhoids, external 06/20/2017  . Muscle cramping 06/20/2017  . Strain of gluteus medius 01/29/2017  . Benign prostatic hyperplasia without urinary obstruction 06/17/2014  . Right inguinal hernia 06/17/2014  . Sacroiliac joint dysfunction of left side 04/05/2014  . Hip pain, left 02/23/2014  . Chronic hyperkalemia 05/20/2012  . Prostatitis 05/20/2012  . Ankle pain 05/19/2012  . Left ankle sprain 05/19/2012  .  BPPV (benign paroxysmal positional vertigo) 07/01/2011  . Concussion syndrome 05/15/2011  . DEGENERATIVE JOINT DISEASE, HANDS 10/16/2009  . SHOULDER PAIN, RIGHT 08/04/2008  . Inguinal pain, right 08/04/2008  . NEUROPATHY 03/01/2008  . SYMPTOM, PAIN, ABDOMINAL, RIGHT UP QUADRANT 12/31/2006  . INJURY NOS, HIP/THIGH 12/31/2006    Past Surgical History:  Procedure Laterality Date  . ANKLE ARTHROSCOPY    . KNEE ARTHROSCOPY          Home Medications    Prior to Admission medications   Medication Sig Start Date End Date Taking? Authorizing Provider  Ascorbic Acid (VITAMIN C) 1000 MG tablet Take 1,000 mg by mouth daily.    [provider]  azithromycin (ZITHROMAX) 500 MG tablet Take 1 tablet (500 mg total) by mouth daily. 12/29/17   Briscoe Deutscher, DO  budesonide-formoterol (SYMBICORT) 160-4.5 MCG/ACT inhaler Inhale 2 puffs into the lungs 2 (two) times daily. 12/29/17   Briscoe Deutscher, DO  Cyanocobalamin (B-12 PO) Take 1 tablet by mouth daily.    [provider]  diazepam (VALIUM) 5 MG tablet Take 1 tablet (5 mg total) by mouth daily as needed for anxiety. 01/29/17   Rogue Bussing, MD  Multiple Vitamins-Minerals (ZINC PO) Take 1 tablet by mouth daily.    [provider]  nitroGLYCERIN (NITRODUR - DOSED IN MG/24 HR) 0.2 mg/hr patch Place 1/4 of patch over affected region. Remove and replace once  daily.  Slightly alter skin placement daily Patient taking differently: as needed. Place 1/4 of patch over affected region. Remove and replace once daily.  Slightly alter skin placement daily 08/27/17   Gerda Diss, DO  Vitamin D, Ergocalciferol, 2000 units CAPS Take 2,000 Units by mouth.    [provider]    Family History Family History  Problem Relation Age of Onset  . Hearing loss Father   . Colon cancer Neg Hx     Social History Social History   Tobacco Use  . Smoking status: Never Smoker  . Smokeless tobacco: Never Used  Substance Use  Topics  . Alcohol use: No  . Drug use: No     Allergies   Codeine and Cefdinir   Review of Systems Review of Systems  Constitutional: Positive for fatigue.  Cardiovascular: Positive for palpitations.  All other systems reviewed and are negative.    Physical Exam Updated Vital Signs BP 123/80 (BP Location: Right Arm)   Pulse 62   Temp 97.9 F (36.6 C) (Oral)   Resp 18   SpO2 97%   Physical Exam  Constitutional: He is oriented to person, place, and time. He appears well-developed and well-nourished. No distress.  HENT:  Head: Normocephalic and atraumatic.  Neck: Neck supple.  Cardiovascular: Normal heart sounds.  Irregularly irregular with heart rate of 50s to 60s while in the room.  No murmur appreciated.  Pulmonary/Chest: Effort normal and breath sounds normal. No respiratory distress.  Abdominal: Soft. He exhibits no distension. There is no tenderness.  Musculoskeletal: He exhibits no edema.  Neurological: He is alert and oriented to person, place, and time.  Skin: Skin is warm and dry.  Nursing note and vitals reviewed.    ED Treatments / Results  Labs (all labs ordered are listed, but only abnormal results are displayed) Labs Reviewed  BASIC METABOLIC PANEL  CBC  I-STAT TROPONIN, ED  I-STAT TROPONIN, ED    EKG EKG Interpretation  Date/Time:  Monday February 22 2018 09:52:37 EDT Ventricular Rate:  60 PR Interval:    QRS Duration: 86 QT Interval:  402 QTC Calculation: 402 R Axis:   86 Text Interpretation:  Atrial fibrillation Abnormal ECG Confirmed by Tanna Furry [id number]) on 02/22/2018 1:54:09 PM   Radiology Dg Chest 2 View  Result Date: 02/22/2018 CLINICAL DATA:  68 year old male with fatigue and palpitations recently. Possible atrial fibrillation. EXAM: CHEST - 2 VIEW COMPARISON:  Chest radiographs 12/29/2017 and earlier. FINDINGS: Normal cardiac size and mediastinal contours. Lung volumes are within normal limits. No pneumothorax, pulmonary edema,  pleural effusion or confluent pulmonary opacity. Visualized tracheal air column is within normal limits. No acute osseous abnormality identified. Negative visible bowel gas pattern. IMPRESSION: Negative.  No acute cardiopulmonary abnormality. Electronically Signed   By: Genevie Ann M.D.   On: 02/22/2018 10:24    Procedures Procedures (including critical care time)  Medications Ordered in ED Medications - No data to display   Initial Impression / Assessment and Plan / ED Course  I have reviewed the triage vital signs and the nursing notes.  Pertinent labs & imaging results that were available during my care of the patient were reviewed by me and considered in my medical decision making (see chart for details).    William Underwood is a 67 y.o. male who presents to ED for fluttering sensation in his chest over the last couple of weeks.  He endorses fatigue as well.  He is a marathon runner and  long distance biker.  He was able to complete a 60 mile bike ride 2 days ago, however felt more tired than normal with this than he expected.  He denies any chest pain, just the palpitations.  On exam, patient is afebrile, BP stable.  He does have an irregularly irregular heart sounds.  No murmurs.  EKG shows A. fib, however heart rate very well controlled.  Heart rate running from 50s to 60s on the monitor while in the room.  Labs reviewed and reassuring.  Negative troponin.  He is not having any chest pain.  Doubt ACS.  Chadsvasc of 1 -we will start on 81 mg aspirin.  Spoke with the atrial fibrillation clinic and have scheduled him an appointment for Tuesday the seventh at 1:30 PM for further outpatient evaluation of new onset A. Fib.  He is aware of follow-up plan, agrees with starting baby aspirin.  We discussed reasons to return to the emergency department in the interim and all questions were answered.   CHA2DS2/VAS Stroke Risk Points  Current as of 4 minutes ago     2 >= 2 Points: High Risk  1 - 1.99  Points: Medium Risk  0 Points: Low Risk    The patient's score has not changed in the past year.:  No Change     Details    This score determines the patient's risk of having a stroke if the  patient has atrial fibrillation.     Points Metrics  0 Has Congestive Heart Failure:  No    Current as of 4 minutes ago  0 Has Vascular Disease:  No    Current as of 4 minutes ago  1 Has Hypertension:  No  Current as of 4 minutes ago  1 Age:  29    Current as of 4 minutes ago  0 Has Diabetes:  No    Current as of 4 minutes ago  0 Had Stroke:  No  Had TIA:  No  Had thromboembolism:  No    Current as of 4 minutes ago  0 Male:  No    Current as of 4 minutes ago       Patient discussed with Dr. Jeneen Rinks who agrees with treatment plan.    Final Clinical Impressions(s) / ED Diagnoses   Final diagnoses:  Atrial fibrillation, unspecified type [facility])    ED Discharge Orders    None       Kristan Brummitt, Ozella Almond, PA-C 02/22/18 1717    Tanna Furry, MD 02/27/18 [phone]

## 2018-02-22 NOTE — Discharge Instructions (Addendum)
It was my pleasure taking care of you today!   You have an appointment with the A. fib clinic on Tuesday, May 7 at 130 with Roderic Palau for further work up of your newly diagnosed atrial fibrillation.   Atrial Fibrillation Clinic at [facility] [place],  [place]  [address] [phone] Gate code for parking - 1200  Please start taking a baby aspirin (81mg ) daily.   Return to ER for new or worsening symptoms, any additional concerns.

## 2018-03-02 ENCOUNTER — Encounter (HOSPITAL_COMMUNITY): Payer: Self-pay | Admitting: Nurse Practitioner

## 2018-03-02 ENCOUNTER — Ambulatory Visit (HOSPITAL_COMMUNITY)
Admit: 2018-03-02 | Discharge: 2018-03-02 | Disposition: A | Discharge status: Home / Self Care | Payer: PPO | Attending: Nurse Practitioner | Admitting: Nurse Practitioner

## 2018-03-02 VITALS — BP 130/78 | HR 46 | Ht 67.0 in | Wt 152.2 lb

## 2018-03-02 DIAGNOSIS — Z7982 Long term (current) use of aspirin: Secondary | ICD-10-CM | POA: Diagnosis not present

## 2018-03-02 DIAGNOSIS — I480 Paroxysmal atrial fibrillation: Secondary | ICD-10-CM | POA: Diagnosis not present

## 2018-03-02 NOTE — Progress Notes (Signed)
Primary Care Physician: William Deutscher, DO Referring Physician: [facility] ER f/u    William Underwood is a 67 y.o. male with no significant medical history, that is here for evaluation of new onset afib. He had noted that his exercise tolerance was down during a lot of his recent bike rides and  long distance runs. One morning was aware of irregular heart beat.  His mother has afib, William Underwood, a pt of William Underwood. He bought a Investment banker, operational and his phone confirmed afib. He left the ER in afib with HR of 60. He since converted. Ekg today shows sinus brady at 46 bpm. He typically runs a low heart rate. He has ran 60 marathons since age 21, when he started running. He has a chadsvasc score of 1 for age. Was placed on a baby asa in the ER. He drinks very little alcohol, has started using decaf coffee, no sleep apnea, no know triggers.  Today, he denies symptoms of palpitations, chest pain, shortness of breath, orthopnea, PND, lower extremity edema, dizziness, presyncope, syncope, or neurologic sequela. The patient is tolerating medications without difficulties and is otherwise without complaint today.   Past Medical History:  Diagnosis Date  . Prostatitis   . Vertigo    Past Surgical History:  Procedure Laterality Date  . ANKLE ARTHROSCOPY    . KNEE ARTHROSCOPY      Current Outpatient Medications  Medication Sig Dispense Refill  . diazepam (VALIUM) 5 MG tablet Take 1 tablet (5 mg total) by mouth daily as needed for anxiety. 30 tablet 1  . Multiple Vitamins-Minerals (MULTIVITAMIN WITH MINERALS) tablet Take 1 tablet by mouth daily.    . traMADol (ULTRAM) 50 MG tablet Take by mouth every 6 (six) hours as needed.    . Vitamin D, Ergocalciferol, 2000 units CAPS Take 2,000 Units by mouth.    . Cyanocobalamin (B-12 PO) Take 1 tablet by mouth daily.     No current facility-administered medications for this encounter.     Allergies  Allergen Reactions  . Codeine     sensitive agitation  . Cefdinir  Palpitations    Social History   Socioeconomic History  . Marital status: Married    Spouse name: Not on file  . Number of children: Not on file  . Years of education: Not on file  . Highest education level: Not on file  Occupational History  . Not on file  Social Needs  . Financial resource strain: Not on file  . Food insecurity:    Worry: Not on file    Inability: Not on file  . Transportation needs:    Medical: Not on file    Non-medical: Not on file  Tobacco Use  . Smoking status: Never Smoker  . Smokeless tobacco: Never Used  Substance and Sexual Activity  . Alcohol use: No  . Drug use: No  . Sexual activity: Not on file  Lifestyle  . Physical activity:    Days per week: Not on file    Minutes per session: Not on file  . Stress: Not on file  Relationships  . Social connections:    Talks on phone: Not on file    Gets together: Not on file    Attends religious service: Not on file    Active member of club or organization: Not on file    Attends meetings of clubs or organizations: Not on file    Relationship status: Not on file  . Intimate partner violence:  Fear of current or ex partner: Not on file    Emotionally abused: Not on file    Physically abused: Not on file    Forced sexual activity: Not on file  Other Topics Concern  . Not on file  Social History Narrative  . Not on file    Family History  Problem Relation Age of Onset  . Hearing loss Father   . Colon cancer Neg Hx     ROS- All systems are reviewed and negative except as per the HPI above  Physical Exam: Vitals:   03/02/18 1327  Weight: 152 lb 3.2 oz (69 kg)  Height: 5\' 7"  (1.702 m)   Wt Readings from Last 3 Encounters:  03/02/18 152 lb 3.2 oz (69 kg)  12/29/17 146 lb 9.6 oz (66.5 kg)  12/18/17 148 lb 9.6 oz (67.4 kg)    Labs: Lab Results  Component Value Date   NA 139 02/22/2018   K 5.1 02/22/2018   CL 104 02/22/2018   CO2 27 02/22/2018   GLUCOSE 90 02/22/2018   BUN 20  02/22/2018   CREATININE 0.87 02/22/2018   CALCIUM 9.8 02/22/2018   No results found for: INR Lab Results  Component Value Date   CHOL 143 07/15/2017   HDL 60.80 07/15/2017   LDLCALC 71 07/15/2017   TRIG 55.0 07/15/2017     GEN- The patient is well appearing, alert and oriented x 3 today.   Head- normocephalic, atraumatic Eyes-  Sclera clear, conjunctiva pink Ears- hearing intact Oropharynx- clear Neck- supple, no JVP Lymph- no cervical lymphadenopathy Lungs- Clear to ausculation bilaterally, normal work of breathing Heart- Slow regular rate and rhythm, no murmurs, rubs or gallops, PMI not laterally displaced GI- soft, NT, ND, + BS Extremities- no clubbing, cyanosis, or edema MS- no significant deformity or atrophy Skin- no rash or lesion Psych- euthymic mood, full affect Neuro- strength and sensation are intact  EKG- Sinus brady at 46 bpm ER EKG reviewed that showed afib at 60 bpm, with long RR intervals    Assessment and Plan: 1. 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

## 2018-03-09 ENCOUNTER — Ambulatory Visit (HOSPITAL_COMMUNITY)
Admission: RE | Admit: 2018-03-09 | Discharge: 2018-03-09 | Disposition: A | Discharge status: Home / Self Care | Payer: PPO | Source: Ambulatory Visit | Attending: Nurse Practitioner | Admitting: Nurse Practitioner

## 2018-03-09 DIAGNOSIS — J984 Other disorders of lung: Secondary | ICD-10-CM

## 2018-03-09 DIAGNOSIS — I480 Paroxysmal atrial fibrillation: Secondary | ICD-10-CM | POA: Diagnosis not present

## 2018-03-09 DIAGNOSIS — I088 Other rheumatic multiple valve diseases: Secondary | ICD-10-CM | POA: Insufficient documentation

## 2018-03-09 HISTORY — DX: Other disorders of lung: J98.4

## 2018-03-09 NOTE — Progress Notes (Signed)
  Echocardiogram 2D Echocardiogram has been performed.  Jennette Dubin 03/09/2018, 8:50 AM

## 2018-03-10 ENCOUNTER — Ambulatory Visit (INDEPENDENT_AMBULATORY_CARE_PROVIDER_SITE_OTHER): Payer: PPO | Admitting: Sports Medicine

## 2018-03-10 ENCOUNTER — Ambulatory Visit: Payer: PPO | Admitting: Sports Medicine

## 2018-03-10 ENCOUNTER — Encounter: Payer: Self-pay | Admitting: Sports Medicine

## 2018-03-10 VITALS — BP 104/72 | HR 60 | Ht 67.0 in | Wt 145.0 lb

## 2018-03-10 DIAGNOSIS — M545 Low back pain, unspecified: Secondary | ICD-10-CM

## 2018-03-10 DIAGNOSIS — M9905 Segmental and somatic dysfunction of pelvic region: Secondary | ICD-10-CM

## 2018-03-10 DIAGNOSIS — G8929 Other chronic pain: Secondary | ICD-10-CM

## 2018-03-10 NOTE — Progress Notes (Signed)
PROCEDURE NOTE : OSTEOPATHIC MANIPULATION The decision today to treat with Osteopathic Manipulative Therapy (OMT) was based on physical exam findings. Verbal consent was obtained following a discussion with the patient regarding the of risks, benefits and potential side effects, including an acute pain flare,post manipulation soreness and need for repeat treatments.     Contraindications: NONE  Manipulation was performed as below: Regions treated: Pelvis OMT Techniques Used: HVLA, muscle energy and myofascial release  The patient tolerated the treatment well and reported Improved symptoms following treatment today. Patient was given medications, exercises, stretches and lifestyle modifications per AVS and verbally.   OSTEOPATHIC/STRUCTURAL EXAM:   Right anterior innonimate

## 2018-03-10 NOTE — Progress Notes (Signed)
William Underwood. William Underwood, William Underwood  William Underwood - 67 y.o. male MRN [id number]  Date of birth: 1950/12/06  Visit Date: 03/10/2018  PCP: Briscoe Deutscher, DO   Referred by: Briscoe Deutscher, DO  Scribe for today's visit: Wendy Poet, LAT, ATC     SUBJECTIVE:  William Underwood is here for Follow-up (L knee pain) .   His L knee pain symptoms INITIALLY: Began 2 weeks ago and hurst more with movement and running Described as mild when setting and can get higher when moving, nonradiating Worsened with running and walking. Improved with ice and elevation.  Additional associated symptoms include: none     He does recall tripping several weeks ago prior to the onset of the pain and thinks he may have tweaked his knee but he is unsure.  The pain does seem to be worsening especially with increasing his mileage.   Compared to the last office visit, his previously described L SIJ and hip and L knee symptoms are improving.  His biggest c/o today is his R SIJ w/ similar symptoms to his prior L SIJ.  He states that his R SIJ will prevent him from running due to the intensity of the pain.  He states that something is going on w/ his running and can now barely do a 10 min mile.  He notes that his legs feel heavy and un-coordinated and has been having some issues w/ fatigue.  He reports that he isn't having any issue w/ riding his bike.  He reports this issue is only w/ running. Current symptoms are mild-severe depending on activity & are radiating to R glute He has been trying his leftover nitroglycerin patches and has been taking Advil.  Went to the ED on 02/22/18 for atrial fibrillation and has now had 4 episodes of a fib over the past 2.5 weeks.  He is now being followed by Dr. Joylene Grapes.  ROS     HISTORY & PERTINENT PRIOR DATA:  Prior History reviewed and updated per electronic medical record.  Significant/pertinent history, findings,  studies include:  reports that he has never smoked. He has never used smokeless tobacco. No results for input(s): HGBA1C, LABURIC, CREATINE in the last 8760 hours. No specialty comments available. No problems updated.  OBJECTIVE:  VS:  HT:5\' 7"  (170.2 cm)   WT:145 lb (65.8 kg)  BMI:22.71    BP:104/72  HR:60bpm  TEMP: ( )  RESP:96 %   PHYSICAL EXAM: Constitutional: WDWN, Non-toxic appearing. Psychiatric: Alert & appropriately interactive.  Not depressed or anxious appearing. Respiratory: No increased work of breathing.  Trachea Midline Eyes: Pupils are equal.  EOM intact without nystagmus.  No scleral icterus  Vascular Exam: warm to touch no edema  lower extremity neuro exam: Unremarkable Negative straight leg raise on the right but painful straight leg raise on the left.  Lower treatment strength is otherwise intact.  MSK Exam: Tightness and pain with palpation of the gluteal musculature and sacroiliac region.  Hip abduction strength is intact and improvement in the past.   ASSESSMENT & PLAN:   1. Chronic right-sided low back pain without sciatica     PLAN: At this point repeat the epidural steroid injection is recommended given the good relief that he had.  He did have a moderate amount of relief with osteopathic manipulation today given the SI joint dysfunction as well as using a long lever manipulation to the pelvis.  He  has recently developed atrial fibrillation is good to be seen by Dr. Rayann Heman.  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  We will plan to have him follow-up in 8 weeks following his epidural to check on his progress for his low back and leg pain.  Defer further management of his cardiac condition to Dr. Rayann Heman and appreciate his expertise.  >50% of this 25 minute visit spent in direct patient counseling and/or coordination of care.  Discussion was focused on education regarding the in discussing the  pathoetiology and anticipated clinical course of the above condition.  Follow-up: Return in about 8 weeks (around 05/05/2018).      Please see additional documentation for Objective, Assessment and Plan sections. Pertinent additional documentation may be included in corresponding procedure notes, imaging studies, problem based documentation and patient instructions. Please see these sections of the encounter for additional information regarding this visit.  CMA/ATC served as Education administrator during this visit. History, Physical, and Plan performed by medical provider. Documentation and orders reviewed and attested to.      Gerda Diss, [facility] Physician

## 2018-03-18 DIAGNOSIS — H524 Presbyopia: Secondary | ICD-10-CM | POA: Diagnosis not present

## 2018-03-24 ENCOUNTER — Encounter

## 2018-03-24 ENCOUNTER — Ambulatory Visit: Payer: PPO | Admitting: Internal Medicine

## 2018-03-24 ENCOUNTER — Encounter: Payer: Self-pay | Admitting: Internal Medicine

## 2018-03-24 VITALS — BP 120/64 | HR 55 | Ht 67.0 in | Wt 147.0 lb

## 2018-03-24 DIAGNOSIS — I480 Paroxysmal atrial fibrillation: Secondary | ICD-10-CM | POA: Diagnosis not present

## 2018-03-24 NOTE — Patient Instructions (Addendum)
Medication Instructions:  Your physician recommends that you continue on your current medications as directed. Please refer to the Current Medication list given to you today.   Labwork: None ordered  Testing/Procedures: None ordered  Follow-Up: Your physician recommends that you schedule a follow-up appointment in: 3 months with Roderic Palau, NP   Any Other Special Instructions Will Be Listed Below (If Applicable).  Call Sonia Baller at [phone], Dr. Jackalyn Lombard nurse, if you decide to proceed with the Afib Ablation.   If you need a refill on your cardiac medications before your next appointment, please call your pharmacy.

## 2018-03-24 NOTE — Progress Notes (Signed)
Electrophysiology Office Note   Date:  03/24/2018   ID:  William Underwood, DOB 05-09-1951, MRN [id number]  PCP:  William Deutscher, DO    Primary Electrophysiologist: Thompson Grayer, MD    CC: afib   History of Present Illness: William Underwood is a 67 y.o. male who presents today for electrophysiology evaluation.   He is referred by Roderic Palau NP for EP consultation and management of atrial fibrillation.  He presented to [facility] ED 02/22/18 with new onset afib.  He has noticed reduced exercise tolerance and irregular heart rates.  He bought [place] and was found to have afib.  He presented to [facility] where AF was confirmed.  He subsequently converted to sinus bradycardia.  He reports that he has previously run marathons.  His heart rates are typically 40s.  He does not drink heavy ETOH.  He finds that with exercise, his afib will terminate. He has found by Jodelle Red that he has afib lasting up to 30 hours several times per week.  He is unaware of triggers/ precipitants.  He thinks that he may have had some afib last fall.  He also finds bigeminal PACs by [place].  He has had these since last fall.  Today, he denies symptoms of palpitations, chest pain, shortness of breath, orthopnea, PND, lower extremity edema, claudication, dizziness, presyncope, syncope, bleeding, or neurologic sequela. The patient is tolerating medications without difficulties and is otherwise without complaint today.    Past Medical History:  Diagnosis Date  . Acute pain of left knee 12/25/2017  . Acute pain of right knee 08/03/2017  . Ankle pain 05/19/2012  . Benign prostatic hyperplasia without urinary obstruction 06/17/2014  . BPPV (benign paroxysmal positional vertigo) 07/01/2011  . Chronic hyperkalemia 05/20/2012   Overview:  Hx of for years  . Concussion syndrome 05/15/2011   This seems like a moderately severe concussion with at least 1 minute loss of consciousness in 15-20 minutes of significant confusion including  being unable to state his name   . DEGENERATIVE JOINT DISEASE, HANDS 10/16/2009   Qualifier: Diagnosis of  By: [name], [name]    . Diverticulosis 06/20/2017  . Hemorrhoids, external 06/20/2017  . Hip pain, left 02/23/2014   Today this primarily appears like a gluteus medius syndrome with a secondary greater trochanteric bursitis   . Inguinal pain, right 08/04/2008   Qualifier: Diagnosis of  By: Hassell Done MD, Mary   US shows hypoechoic change but findings are not clearly diagnostic  Clinically seems like sports hernia  . INJURY NOS, HIP/THIGH 12/31/2006   Qualifier: Diagnosis of  By: [name], [name]    . Left ankle sprain 05/19/2012   This is associated with small avulsion fleck from [place]  Note that his RT ankle did have surgery for old ankle fracutre   . Left shoulder pain 07/14/2017  . Lumbar spondylosis 06/20/2017   Fairly significant L4-L5 facet arthropathy with effusion.  Can consider facet injections and potentially RFA down the road.  . Muscle cramping 06/20/2017  . NEUROPATHY 03/01/2008   Qualifier: Diagnosis of  By: [name], [name]    . Pain of left heel 02/22/2015   On [place] this looks like a contusion with an organized hematoma   . Prostatitis   . Right inguinal hernia 06/17/2014  . Sacroiliac joint dysfunction of left side 04/05/2014   Most of his SI joint dysfunction has resolved   . SHOULDER PAIN, RIGHT 08/04/2008   Qualifier: Diagnosis of  By: Hassell Done MD,  Mary    . Strain of gluteus medius 01/29/2017  . SYMPTOM, PAIN, ABDOMINAL, RIGHT UP QUADRANT 12/31/2006   Qualifier: Diagnosis of  By: [name], [name]    . Vertigo    Past Surgical History:  Procedure Laterality Date  . ANKLE ARTHROSCOPY    . KNEE ARTHROSCOPY       Current Outpatient Medications  Medication Sig Dispense Refill  . diazepam (VALIUM) 5 MG tablet Take 1 tablet (5 mg total) by mouth daily as needed for anxiety. 30 tablet 1  . Multiple Vitamins-Minerals (MULTIVITAMIN WITH MINERALS) tablet Take 1 tablet by mouth daily as  needed.     . traMADol (ULTRAM) 50 MG tablet Take by mouth every 6 (six) hours as needed.    . Vitamin D, Ergocalciferol, 2000 units CAPS Take 2,000 Units by mouth daily as needed.      No current facility-administered medications for this visit.     Allergies:   Codeine and Cefdinir   Social History:  The patient  reports that he has never smoked. He has never used smokeless tobacco. He reports that he does not drink alcohol or use drugs.   Family History:  The patient's  family history includes Hearing loss in his father.    ROS:  Please see the history of present illness.   All other systems are personally reviewed and negative.    PHYSICAL EXAM: VS:  BP 120/64   Pulse (!) 55   Ht 5\' 7"  (1.702 m)   Wt 147 lb (66.7 kg)   BMI 23.02 kg/m  , BMI Body mass index is 23.02 kg/m. GEN: Well nourished, well developed, in no acute distress  HEENT: normal  Neck: no JVD, carotid bruits, or masses Cardiac: bradycardic irregular rhythm, no murmurs, rubs, or gallops,no edema  Respiratory:  clear to auscultation bilaterally, normal work of breathing GI: soft, nontender, nondistended, + BS MS: no deformity or atrophy  Skin: warm and dry  Neuro:  Strength and sensation are intact Psych: euthymic mood, full affect  EKG:  EKG is ordered today. The ekg ordered today is personally reviewed and shows afib, V rate 55 bpm, Qtc 389 msec   Recent Labs: 07/15/2017: ALT 14 02/22/2018: BUN 20; Creatinine, Ser 0.87; Hemoglobin 15.5; Platelets 224; Potassium 5.1; Sodium 139  personally reviewed   Lipid Panel     Component Value Date/Time   CHOL 143 07/15/2017 0807   TRIG 55.0 07/15/2017 0807   HDL 60.80 07/15/2017 0807   CHOLHDL 2 07/15/2017 0807   VLDL 11.0 07/15/2017 0807   LDLCALC 71 07/15/2017 0807   personally reviewed   Wt Readings from Last 3 Encounters:  03/24/18 147 lb (66.7 kg)  03/10/18 145 lb (65.8 kg)  03/02/18 152 lb 3.2 oz (69 kg)      Other studies personally  reviewed: Additional studies/ records that were reviewed today include: AF clinic notes, echo 03/09/18 reveals   Review of the above records today demonstrates: EF 60%, trivial AI, trivial MR, moderate LA enlargement, mild TR, moderate pulmonary regurgitation, PAS 33 mm Hg.  LA size 39 mm, LA vol 82.6   ASSESSMENT AND PLAN:  1.  Paroxysmal atrial fibrillation Vagally mediated The patient has symptomatic, recurrent atrial fibrillation. Given low heart rate, he is not a candidate for medical therapy Chads2vasc score is 1.   Therapeutic strategies for afib including medicine and ablation were discussed in detail with the patient today. Risk, benefits, and alternatives to EP study and radiofrequency ablation for afib  were also discussed in detail today. These risks include but are not limited to stroke, bleeding, vascular damage, tamponade, perforation, damage to the esophagus, lungs, and other structures, pulmonary vein stenosis, worsening renal function, and death. The patient understands these risk and wishes to think about this further.  He will contact our office if he decides to proceed.  he would require  Carto, ICE, anesthesia if he procees as well as Cardiac CT prior to the procedure to exclude LAA thrombus and further evaluate atrial anatomy.  He should start xarelto at least 3 weeks prior to ablation  Follow-up:  AF clinic in 3 months He will contact our office if he decides to proceed with ablation in the interim.    Army Fossa, MD  03/24/2018 9:36 AM     [facility] [address] [place] [address] [phone] (office) [phone] (fax)

## 2018-03-25 ENCOUNTER — Encounter: Payer: Self-pay | Admitting: Sports Medicine

## 2018-04-05 ENCOUNTER — Ambulatory Visit (INDEPENDENT_AMBULATORY_CARE_PROVIDER_SITE_OTHER): Payer: PPO | Admitting: Physical Medicine and Rehabilitation

## 2018-04-05 ENCOUNTER — Ambulatory Visit (INDEPENDENT_AMBULATORY_CARE_PROVIDER_SITE_OTHER): Payer: PPO

## 2018-04-05 ENCOUNTER — Encounter (INDEPENDENT_AMBULATORY_CARE_PROVIDER_SITE_OTHER): Payer: Self-pay | Admitting: Physical Medicine and Rehabilitation

## 2018-04-05 VITALS — BP 125/79 | HR 47

## 2018-04-05 DIAGNOSIS — M25551 Pain in right hip: Secondary | ICD-10-CM

## 2018-04-05 DIAGNOSIS — M48062 Spinal stenosis, lumbar region with neurogenic claudication: Secondary | ICD-10-CM

## 2018-04-05 DIAGNOSIS — M5416 Radiculopathy, lumbar region: Secondary | ICD-10-CM | POA: Diagnosis not present

## 2018-04-05 DIAGNOSIS — M461 Sacroiliitis, not elsewhere classified: Secondary | ICD-10-CM

## 2018-04-05 MED ORDER — METHYLPREDNISOLONE ACETATE 80 MG/ML IJ SUSP
80.0000 mg | Freq: Once | INTRAMUSCULAR | Status: AC
  Administered 2018-04-05: 80 mg

## 2018-04-05 NOTE — Progress Notes (Signed)
William Underwood - 67 y.o. male MRN [id number]  Date of birth: [birth year]  Office Visit Note: Visit Date: 04/05/2018 PCP: Briscoe Deutscher, DO Referred by: Briscoe Deutscher, DO  Subjective: Chief Complaint  Patient presents with  . Lower Back - Pain  . Right Hip - Pain   HPI: William Underwood is a 67 year old gentleman who is followed by Dr. Teresa Coombs from a sports medicine and orthopedic standpoint.  He comes in today with worsening right buttock pain.  He reports this is mostly on the right side right at the buttock or sacroiliac joint region.  He does endorse some pain anteriorly into the groin area but mostly anterior thigh.  He reports that the right-sided pain started in March of this year and that he feels like running really makes it worse.  Not running seems to make it some better.  He tells me that he seems convinced it is more of the sacroiliac joint.  He has had osteopathic manipulation with Dr. [name].  I actually saw William Underwood in January and completed a left-sided L4 transforaminal epidural steroid injection for multifactorial moderate stenosis at this area with significant facet arthropathy.  He reported good relief on the left side after that injection.  He has not had any pain or paresthesia down the legs.  He has had no focal weakness.  He continues to stay active with running.  He has had multiple orthopedic complaints in the past.  Most recently he has actually been diagnosed with atrial fibrillation.  He has had no specific trauma.  He has not had updated MRI.  He reports his average pain is an 8 out of 10 and it does seem to limit a lot of the activities he would like to do.  He is failed medication management.   Review of Systems  Constitutional: Negative for chills, fever, malaise/fatigue and weight loss.  HENT: Negative for hearing loss and sinus pain.   Eyes: Negative for blurred vision, double vision and photophobia.  Respiratory: Negative for cough and shortness of breath.    Cardiovascular: Negative for chest pain, palpitations and leg swelling.  Gastrointestinal: Negative for abdominal pain, nausea and vomiting.  Genitourinary: Negative for flank pain.  Musculoskeletal: Positive for back pain and joint pain. Negative for myalgias.  Skin: Negative for itching and rash.  Neurological: Negative for tremors, focal weakness and weakness.  Endo/Heme/Allergies: Negative.   Psychiatric/Behavioral: Negative for depression.  All other systems reviewed and are negative.  Otherwise per HPI.  Assessment & Plan: Visit Diagnoses:  1. Lumbar radiculopathy   2. Spinal stenosis of lumbar region with neurogenic claudication   3. Sacroiliitis ([place])   4. Pain in right hip     Plan: Findings:  Chronic worsening right buttock pain centered over the right sacroiliac joint.  He does have positive Patrick's exam today reproducing a lot of his posterior hip pain but also reproduces this bilaterally.  He does not have any pain of the hip with internal rotation.  He has a negative slump test.  Given the amount of pain that he is having in the character of the pain I still feel like this could be a radicular L4-L5 type pain from the stenosis.  Knowing that he had good relief in the past with the epidural injection we are going to try a right L4 transforaminal injection.  Had this declared itself more down the leg we might of treated a different nerve such as the L5.  If he does  not get much relief and I would suggest a diagnostic sacroiliac joint injection.  Lastly could not rule out some form of hip pathology although he has pretty good range of motion of the hip.  I do not think his facet arthropathy even though its seen is causing him not much in the way of pain at this point.  I think his pain would be a little bit higher in location of this for facet mediated pain.  Continue with current medications and exercise plan and activity modification.    Meds & Orders:  Meds ordered this  encounter  Medications  . methylPREDNISolone acetate (DEPO-MEDROL) injection 80 mg    Orders Placed This Encounter  Procedures  . XR C-ARM NO REPORT  . Epidural Steroid injection    Follow-up: Return if symptoms worsen or fail to improve.   Procedures: No procedures performed  Lumbosacral Transforaminal Epidural Steroid Injection - Sub-Pedicular Approach with Fluoroscopic Guidance  Patient: William Underwood      Date of Birth: [birth year] MRN: [id number] PCP: Briscoe Deutscher, DO      Visit Date: 04/05/2018   Universal Protocol:    Date/Time: 04/05/2018  Consent Given By: the patient  Position: PRONE  Additional Comments: Vital signs were monitored before and after the procedure. Patient was prepped and draped in the usual sterile fashion. The correct patient, procedure, and site was verified.   Injection Procedure Details:  Procedure Site One Meds Administered:  Meds ordered this encounter  Medications  . methylPREDNISolone acetate (DEPO-MEDROL) injection 80 mg    Laterality: Right  Location/Site:  L4-L5  Needle size: 22 G  Needle type: Spinal  Needle Placement: Transforaminal  Findings:    -Comments: Excellent flow of contrast along the nerve and into the epidural space.  Procedure Details: After squaring off the end-plates to get a true AP view, the C-arm was positioned so that an oblique view of the foramen as noted above was visualized. The target area is just inferior to the "nose of the scotty dog" or sub pedicular. The soft tissues overlying this structure were infiltrated with 2-3 ml. of 1% Lidocaine without Epinephrine.  The spinal needle was inserted toward the target using a "trajectory" view along the fluoroscope beam.  Under AP and lateral visualization, the needle was advanced so it did not puncture dura and was located close the 6 O'Clock position of the pedical in AP tracterory. Biplanar projections were used to confirm position. Aspiration was  confirmed to be negative for CSF and/or blood. A 1-2 ml. volume of Isovue-250 was injected and flow of contrast was noted at each level. Radiographs were obtained for documentation purposes.   After attaining the desired flow of contrast documented above, a 0.5 to 1.0 ml test dose of 0.25% Marcaine was injected into each respective transforaminal space.  The patient was observed for 90 seconds post injection.  After no sensory deficits were reported, and normal lower extremity motor function was noted,   the above injectate was administered so that equal amounts of the injectate were placed at each foramen (level) into the transforaminal epidural space.   Additional Comments:  The patient tolerated the procedure well Dressing: Band-Aid    Post-procedure details: Patient was observed during the procedure. Post-procedure instructions were reviewed.  Patient left the clinic in stable condition.    Clinical History: MRI LUMBAR SPINE WITHOUT CONTRAST COMPARISON:  Plain films of the lumbar spine 06/01/2017.  FINDINGS: Segmentation:  Standard.  Alignment:  0.2 cm retrolisthesis L2  on L3 is noted.  Vertebrae: No fracture or worrisome lesion. Degenerative endplate signal change is seen at L5-S1 and anteriorly at L2-3. Small hemangioma in T11 is noted.  Conus medullaris and cauda equina: Conus extends to the L1 level. Conus and cauda equina appear normal.  Paraspinal and other soft tissues: Negative.  Disc levels:  T11-12 is imaged in the sagittal plane only and negative.  T12-L1:  Negative.  L1-2:  Negative.  L2-3: Shallow disc bulge causes minimal narrowing in the left subarticular recess. The thecal sac and foramina are widely patent.  L3-4:  Minimal disc bulge without stenosis.  L4-5: Moderate to advanced facet degenerative disease is worse on the left where there is a small associated joint effusion. The patient has a shallow broad-based central protrusion and  ligamentum flavum thickening. There is moderate central canal stenosis and left much worse than right subarticular recess narrowing which could impact either descending L5 root. Disc and facet arthropathy cause moderately severe left foraminal narrowing. Mild right foraminal narrowing is also seen.  L5-S1: Shallow broad-based disc bulge without central canal stenosis. The left foramen is open. Moderate right foraminal narrowing is seen.  IMPRESSION: Lumbar spondylosis worst at L4-5 where there is moderate central canal stenosis and left much worse than right subarticular recess narrowing which could impact either descending L5 root. Mild right and moderately severe left foraminal narrowing is also present at this level.  Shallow disc bulge at L2-3 causes minimal narrowing in the left subarticular recess.  Moderate right foraminal narrowing L5-S1.   Electronically Signed   By: Inge Rise M.D.   On: 10/17/2017 13:12   He reports that he has never smoked. He has never used smokeless tobacco. No results for input(s): HGBA1C, LABURIC in the last 8760 hours.  Objective:  VS:  HT:    WT:   BMI:     BP:125/79  HR:(!) 47bpm  TEMP: ( )  RESP:98 % Physical Exam  Constitutional: He is oriented to person, place, and time. He appears well-developed and well-nourished. No distress.  HENT:  Head: Normocephalic and atraumatic.  Eyes: Pupils are equal, round, and reactive to light. Conjunctivae are normal.  Neck: Normal range of motion. Neck supple.  Cardiovascular: Normal rate and intact distal pulses.  Pulmonary/Chest: Effort normal. No respiratory distress.  Musculoskeletal:  Patient ambulates without aid.  He has some pain with extension rotation of the lumbar spine but not concordant pain.  He does have a positive Fortin finger sign over the right sacroiliac joint.  He has a positive Patrick's exam bilaterally.  He has more stiffness on the left hip with external rotation  on the right.  He has no pain with hip rotation internally.  He has good distal strength without deficits.  Neurological: He is alert and oriented to person, place, and time. He exhibits normal muscle tone. Coordination normal.  Skin: Skin is warm and dry. No rash noted. No erythema.  Psychiatric: He has a normal mood and affect.  Nursing note and vitals reviewed.   Ortho Exam Imaging: Xr C-arm No Report  Result Date: 04/05/2018 Please see Notes or Procedures tab for imaging impression.   Past Medical/Family/Surgical/Social History: Medications & Allergies reviewed per EMR, new medications updated. Patient Active Problem List   Diagnosis Date Noted  . Acute pain of left knee 12/25/2017  . Acute pain of right knee 08/03/2017  . Left shoulder pain 07/14/2017  . Lumbar spondylosis 06/20/2017  . Diverticulosis 06/20/2017  . Hemorrhoids, external 06/20/2017  .  Muscle cramping 06/20/2017  . Strain of gluteus medius 01/29/2017  . Benign prostatic hyperplasia without urinary obstruction 06/17/2014  . Right inguinal hernia 06/17/2014  . Sacroiliac joint dysfunction of left side 04/05/2014  . Hip pain, left 02/23/2014  . Chronic hyperkalemia 05/20/2012  . Prostatitis 05/20/2012  . Ankle pain 05/19/2012  . Left ankle sprain 05/19/2012  . BPPV (benign paroxysmal positional vertigo) 07/01/2011  . Concussion syndrome 05/15/2011  . DEGENERATIVE JOINT DISEASE, HANDS 10/16/2009  . Inguinal pain, right 08/04/2008  . NEUROPATHY 03/01/2008  . SYMPTOM, PAIN, ABDOMINAL, RIGHT UP QUADRANT 12/31/2006  . INJURY NOS, HIP/THIGH 12/31/2006   Past Medical History:  Diagnosis Date  . Acute pain of left knee 12/25/2017  . Acute pain of right knee 08/03/2017  . Ankle pain 05/19/2012  . Benign prostatic hyperplasia without urinary obstruction 06/17/2014  . BPPV (benign paroxysmal positional vertigo) 07/01/2011  . Chronic hyperkalemia 05/20/2012   Overview:  Hx of for years  . Concussion syndrome 05/15/2011     This seems like a moderately severe concussion with at least 1 minute loss of consciousness in 15-20 minutes of significant confusion including being unable to state his name   . DEGENERATIVE JOINT DISEASE, HANDS 10/16/2009   Qualifier: Diagnosis of  By: [name], [name]    . Diverticulosis 06/20/2017  . Hemorrhoids, external 06/20/2017  . Hip pain, left 02/23/2014   Today this primarily appears like a gluteus medius syndrome with a secondary greater trochanteric bursitis   . Inguinal pain, right 08/04/2008   Qualifier: Diagnosis of  By: Hassell Done MD, Mary   US shows hypoechoic change but findings are not clearly diagnostic  Clinically seems like sports hernia  . INJURY NOS, HIP/THIGH 12/31/2006   Qualifier: Diagnosis of  By: [name], [name]    . Left ankle sprain 05/19/2012   This is associated with small avulsion fleck from talus  Note that his RT ankle did have surgery for old ankle fracutre   . Left shoulder pain 07/14/2017  . Lumbar spondylosis 06/20/2017   Fairly significant L4-L5 facet arthropathy with effusion.  Can consider facet injections and potentially RFA down the road.  . Muscle cramping 06/20/2017  . NEUROPATHY 03/01/2008   Qualifier: Diagnosis of  By: [name], [name]    . Pain of left heel 02/22/2015   On [place] this looks like a contusion with an organized hematoma   . Prostatitis   . Right inguinal hernia 06/17/2014  . Sacroiliac joint dysfunction of left side 04/05/2014   Most of his SI joint dysfunction has resolved   . SHOULDER PAIN, RIGHT 08/04/2008   Qualifier: Diagnosis of  By: Hassell Done MD, Stanton Kidney    . Strain of gluteus medius 01/29/2017  . SYMPTOM, PAIN, ABDOMINAL, RIGHT UP QUADRANT 12/31/2006   Qualifier: Diagnosis of  By: [name], [name]    . Vertigo    Family History  Problem Relation Age of Onset  . Hearing loss Father   . Colon cancer Neg Hx    Past Surgical History:  Procedure Laterality Date  . ANKLE ARTHROSCOPY    . KNEE ARTHROSCOPY     Social History   Occupational  History  . Not on file  Tobacco Use  . Smoking status: Never Smoker  . Smokeless tobacco: Never Used  Substance and Sexual Activity  . Alcohol use: No  . Drug use: No  . Sexual activity: Not on file

## 2018-04-05 NOTE — Patient Instructions (Signed)

## 2018-04-05 NOTE — Progress Notes (Signed)
 .  Numeric Pain Rating Scale and Functional Assessment Average Pain 8   In the last MONTH (on 0-10 scale) has pain interfered with the following?  1. General activity like being  able to carry out your everyday physical activities such as walking, climbing stairs, carrying groceries, or moving a chair?  Rating(6)   +Driver, -BT, -Dye Allergies.  

## 2018-04-06 NOTE — Procedures (Signed)
Lumbosacral Transforaminal Epidural Steroid Injection - Sub-Pedicular Approach with Fluoroscopic Guidance  Patient: William Underwood      Date of Birth: 1951/10/22 MRN: [id number] PCP: Briscoe Deutscher, DO      Visit Date: 04/05/2018   Universal Protocol:    Date/Time: 04/05/2018  Consent Given By: the patient  Position: PRONE  Additional Comments: Vital signs were monitored before and after the procedure. Patient was prepped and draped in the usual sterile fashion. The correct patient, procedure, and site was verified.   Injection Procedure Details:  Procedure Site One Meds Administered:  Meds ordered this encounter  Medications  . methylPREDNISolone acetate (DEPO-MEDROL) injection 80 mg    Laterality: Right  Location/Site:  L4-L5  Needle size: 22 G  Needle type: Spinal  Needle Placement: Transforaminal  Findings:    -Comments: Excellent flow of contrast along the nerve and into the epidural space.  Procedure Details: After squaring off the end-plates to get a true AP view, the C-arm was positioned so that an oblique view of the foramen as noted above was visualized. The target area is just inferior to the "nose of the scotty dog" or sub pedicular. The soft tissues overlying this structure were infiltrated with 2-3 ml. of 1% Lidocaine without Epinephrine.  The spinal needle was inserted toward the target using a "trajectory" view along the fluoroscope beam.  Under AP and lateral visualization, the needle was advanced so it did not puncture dura and was located close the 6 O'Clock position of the pedical in AP tracterory. Biplanar projections were used to confirm position. Aspiration was confirmed to be negative for CSF and/or blood. A 1-2 ml. volume of Isovue-250 was injected and flow of contrast was noted at each level. Radiographs were obtained for documentation purposes.   After attaining the desired flow of contrast documented above, a 0.5 to 1.0 ml test dose of  0.25% Marcaine was injected into each respective transforaminal space.  The patient was observed for 90 seconds post injection.  After no sensory deficits were reported, and normal lower extremity motor function was noted,   the above injectate was administered so that equal amounts of the injectate were placed at each foramen (level) into the transforaminal epidural space.   Additional Comments:  The patient tolerated the procedure well Dressing: Band-Aid    Post-procedure details: Patient was observed during the procedure. Post-procedure instructions were reviewed.  Patient left the clinic in stable condition.

## 2018-04-12 ENCOUNTER — Telehealth: Payer: Self-pay | Admitting: Internal Medicine

## 2018-04-12 ENCOUNTER — Encounter: Payer: Self-pay | Admitting: Internal Medicine

## 2018-04-12 NOTE — Telephone Encounter (Signed)
Pt calling to schedule Afib Ablation for asap, states he thinks he is to be on blood thinners for 3 weeks prior but wants to schedule as soon as poss after that -pls call [phone]

## 2018-04-13 ENCOUNTER — Other Ambulatory Visit: Payer: Self-pay

## 2018-04-13 DIAGNOSIS — I480 Paroxysmal atrial fibrillation: Secondary | ICD-10-CM

## 2018-04-13 MED ORDER — RIVAROXABAN 20 MG PO TABS: 20.0000 mg | ORAL_TABLET | Freq: Every day | ORAL | 11 refills | Status: DC

## 2018-04-13 NOTE — Progress Notes (Signed)
are

## 2018-04-14 NOTE — Telephone Encounter (Signed)
Duplicate.  See Patient email thread. Closing.

## 2018-04-22 ENCOUNTER — Encounter

## 2018-04-22 ENCOUNTER — Ambulatory Visit: Payer: PPO | Admitting: Sports Medicine

## 2018-04-22 DIAGNOSIS — M47816 Spondylosis without myelopathy or radiculopathy, lumbar region: Secondary | ICD-10-CM | POA: Diagnosis not present

## 2018-04-22 DIAGNOSIS — S76011A Strain of muscle, fascia and tendon of right hip, initial encounter: Secondary | ICD-10-CM | POA: Diagnosis not present

## 2018-04-22 NOTE — Assessment & Plan Note (Signed)
This may be the trigger for Glut med syndrome that he has now had on RT and LT  May need to try gabapentin in future

## 2018-04-22 NOTE — Progress Notes (Signed)
CC: RT hip pain  Patient has had issues with SIJ pain over years  Exercises and stretches have helped Has seen Dr [name] and manipulation helps for short term  Had severe left hip and some leg pain For that an epidural at L4 really alleviated the pain  Developed similar pain over RT hip Injection of lumbar spine for this was no help Still painful w any running Able to bike up to 50 miles without pain  Comes for advice as he would like to run  ROS Irregular HB with known atrial fib but no syncope or tachycardia No true sciatica No bowel or bladder issus Pain at SIJ but not mid back  PE Thin W M in NAD BP 124/68   Ht 5\' 7"  (1.702 m)   Wt 147 lb (66.7 kg)   BMI 23.02 kg/m   Both SIJs move but left better than RT Pain on FABER on RT Hip ROM is normal Weakness in RT hip abduction Pain on RT hip rotation No palpable tenderness over GT  Neg SLR Normal neuro screen

## 2018-04-22 NOTE — Assessment & Plan Note (Signed)
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

## 2018-04-23 ENCOUNTER — Encounter: Payer: Self-pay | Admitting: Internal Medicine

## 2018-04-27 ENCOUNTER — Other Ambulatory Visit: Payer: PPO

## 2018-05-03 ENCOUNTER — Encounter: Payer: Self-pay | Admitting: Internal Medicine

## 2018-05-03 ENCOUNTER — Other Ambulatory Visit: Payer: PPO

## 2018-05-03 ENCOUNTER — Ambulatory Visit: Payer: PPO | Admitting: Internal Medicine

## 2018-05-03 VITALS — BP 124/62 | HR 48 | Ht 67.0 in | Wt 148.0 lb

## 2018-05-03 DIAGNOSIS — I480 Paroxysmal atrial fibrillation: Secondary | ICD-10-CM | POA: Diagnosis not present

## 2018-05-03 MED ORDER — METOPROLOL TARTRATE 50 MG PO TABS: 50.0000 mg | ORAL_TABLET | Freq: Once | ORAL | 0 refills | Status: DC

## 2018-05-03 NOTE — H&P (View-Only) (Signed)
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  Since last being seen in our clinic, the patient reports doing very well.  Today, he denies symptoms of palpitations, chest pain, shortness of breath,  lower extremity edema, dizziness, presyncope, or syncope.  The patient is otherwise without complaint today.   Past Medical History:  Diagnosis Date  . Acute pain of left knee 12/25/2017  . Acute pain of right knee 08/03/2017  . Ankle pain 05/19/2012  . Benign prostatic hyperplasia without urinary obstruction 06/17/2014  . BPPV (benign paroxysmal positional vertigo) 07/01/2011  . Chronic hyperkalemia 05/20/2012   Overview:  Hx of for years  . Concussion syndrome 05/15/2011   This seems like a moderately severe concussion with at least 1 minute loss of consciousness in 15-20 minutes of significant confusion including being unable to state his name   . DEGENERATIVE JOINT DISEASE, HANDS 10/16/2009   Qualifier: Diagnosis of  By: [name], [name]    . Diverticulosis 06/20/2017  . Hemorrhoids, external 06/20/2017  . Hip pain, left 02/23/2014   Today this primarily appears like a gluteus medius syndrome with a secondary greater trochanteric bursitis   . Inguinal pain, right 08/04/2008   Qualifier: Diagnosis of  By: Hassell Done MD, Mary   US shows hypoechoic change but findings are not clearly diagnostic  Clinically seems like sports hernia  . INJURY NOS, HIP/THIGH 12/31/2006   Qualifier: Diagnosis of  By: [name], [name]    . Left ankle sprain 05/19/2012   This is associated with small avulsion fleck from talus  Note that his RT ankle did have surgery for old ankle fracutre   . Left shoulder pain 07/14/2017  . Lumbar spondylosis 06/20/2017   Fairly significant L4-L5 facet arthropathy with effusion.  Can consider facet injections and potentially RFA down the road.  . Muscle cramping 06/20/2017  . NEUROPATHY 03/01/2008   Qualifier: Diagnosis of  By: [name], [name]    . Pain of left heel 02/22/2015   On [place] this looks like a contusion with an organized hematoma   . Prostatitis   . Right inguinal hernia 06/17/2014  . Sacroiliac joint dysfunction of left side 04/05/2014   Most of his SI joint dysfunction has resolved   . SHOULDER PAIN, RIGHT 08/04/2008   Qualifier: Diagnosis of  By: Hassell Done MD, Stanton Kidney    . Strain of gluteus medius 01/29/2017  . SYMPTOM, PAIN, ABDOMINAL, RIGHT UP QUADRANT 12/31/2006   Qualifier: Diagnosis of  By: [name], [name]    . Vertigo    Past Surgical History:  Procedure Laterality Date  . ANKLE ARTHROSCOPY    . KNEE ARTHROSCOPY      ROS- all systems are reviewed and negatives except as per HPI above  Current Outpatient Medications  Medication Sig Dispense Refill  . aspirin EC 81 MG tablet Take 81 mg by mouth daily.    . diazepam (VALIUM) 5 MG tablet Take 1 tablet (5 mg total) by mouth daily as needed for anxiety. 30 tablet 1  . Multiple Vitamins-Minerals (MULTIVITAMIN WITH MINERALS) tablet Take 1 tablet by mouth daily as needed.     . traMADol (ULTRAM) 50 MG tablet Take by mouth every 6 (six) hours as needed.    . Vitamin D, Ergocalciferol, 2000 units CAPS Take 2,000 Units by mouth daily as needed.     . rivaroxaban (XARELTO) 20 MG TABS tablet Take  1 tablet (20 mg total) by mouth daily with supper. (Patient not taking: Reported on 05/03/2018) 30 tablet 11   No current facility-administered medications for this visit.     Physical Exam: Vitals:   05/03/18 1236  BP: 124/62  Pulse: (!) 48  Weight: 148 lb (67.1 kg)  Height: 5\' 7"  (1.702 m)    GEN- The patient is well appearing, alert and oriented x 3 today.   Head- normocephalic, atraumatic Eyes-  Sclera clear, conjunctiva pink Ears- hearing intact Oropharynx- clear Lungs- Clear to ausculation bilaterally, normal work of breathing Heart- Regular rate and rhythm, no murmurs, rubs or gallops, PMI not laterally displaced GI- soft,  NT, ND, + BS Extremities- no clubbing, cyanosis, or edema  Wt Readings from Last 3 Encounters:  05/03/18 148 lb (67.1 kg)  04/22/18 147 lb (66.7 kg)  03/24/18 147 lb (66.7 kg)    EKG tracing ordered today is personally reviewed and shows sinus bradycardia 48 bpm, PR 178 msec, QRS 90 msec, Qtc 385 msec  Assessment and Plan:  1. Paroxysmal atrial fibrillation The patient has symptomatic, recurrent vagally mediated atrial fibrillation. Not a candidate for medical therapy given significant bradycardia Chads2vasc score is 1.   Therapeutic strategies for afib including ablation were discussed in detail with the patient today. Risk, benefits, and alternatives to EP study and radiofrequency ablation for afib were also discussed in detail today. These risks include but are not limited to stroke, bleeding, vascular damage, tamponade, perforation, damage to the esophagus, lungs, and other structures, pulmonary vein stenosis, worsening renal function, and death. The patient understands these risk and wishes to proceed.  We will therefore proceed with catheter ablation at the next available time.  Carto, ICE, anesthesia are requested for the procedure.  Will also obtain Cardiac CT prior to the procedure to exclude LAA thrombus and further evaluate atrial anatomy.    Thompson Grayer MD, [facility] 05/03/2018 12:45 PM

## 2018-05-03 NOTE — Patient Instructions (Addendum)
Medication Instructions:  Your physician has recommended you make the following change in your medication:  1.  Start taking Xarelto 20 mg one tablet by mouth daily with supper. 2.  Do NOT take ASPIRIN while on Xarelto.  DO NOT MISS ANY DOSES OF YOUR XARELTO OR YOUR PROCEDURE WILL BE RESCHEDULED  Labwork: You will get lab work today:  BMP and CBC  Testing/Procedures:  You are scheduled for cardiac CT on May 19, 2018 at 8:30 am.  Please arrive 15 minutes early.  You are scheduled for atrial fibrillation ablation on May 25, 2018.  Arrival time is 9:30 am.  Follow-Up: You will follow up with Roderic Palau NP at the [facility] clinic on June 21, 2018 at 10:00 am.  Any Other Special Instructions Will Be Listed Below (If Applicable).  If you need a refill on your cardiac medications before your next appointment, please call your pharmacy.    CARDIAC CT INSTRUCTIONS:  Please arrive at the [facility] main entrance of [facility] [place], [place] [address] [phone]  Proceed to the [facility] Radiology Department (First Floor).  Please follow these instructions carefully (unless otherwise directed):  On the Night Before the Test: . Drink plenty of water. . Do not consume any caffeinated/decaffeinated beverages or chocolate 12 hours prior to your test. . Do not take any antihistamines 12 hours prior to your test.  On the Day of the Test: . Drink plenty of water. Do not drink any water within one hour of the test. . Do not eat any food 4 hours prior to the test. . You may take your regular medications prior to the test. IF NOT ON A BETA BLOCKER - Take 50 mg of lopressor (metoprolol) one hour before the test.    After the Test: . Drink plenty of water. . After receiving IV contrast, you may experience a mild flushed feeling. This is normal. . On occasion, you may experience a mild rash up to 24 hours after the test. This is not  dangerous. If this occurs, you can take Benadryl 25 mg and increase your fluid intake. . If you experience trouble breathing, this can be serious. If it is severe call 911 IMMEDIATELY. If it is mild, please call our office.   ABLATION INSTRUCTIONS:  Please arrive to admitting down the hall from the main entrance of [facility] hospital at: 9:30 am on May 25, 2018 Do not eat or drink after midnight prior to procedure On the morning of your procedure do NOT take any medications. Plan for one night stay.   You will need someone to drive you home at discharge.     Cardiac Ablation Cardiac ablation is a procedure to disable (ablate) a small amount of heart tissue in very specific places. The heart has many electrical connections. Sometimes these connections are abnormal and can cause the heart to beat very fast or irregularly. Ablating some of the problem areas can improve the heart rhythm or return it to normal. Ablation may be done for people who:  Have Wolff-Parkinson-White syndrome.  Have fast heart rhythms (tachycardia).  Have taken medicines for an abnormal heart rhythm (arrhythmia) that were not effective or caused side effects.  Have a high-risk heartbeat that may be life-threatening.  During the procedure, a small incision is made in the neck or the groin, and a long, thin, flexible tube (catheter) is inserted into the incision and moved to the heart. Small devices (  electrodes) on the tip of the catheter will send out electrical currents. A type of X-ray (fluoroscopy) will be used to help guide the catheter and to provide images of the heart. Tell a health care provider about:  Any allergies you have.  All medicines you are taking, including vitamins, herbs, eye drops, creams, and over-the-counter medicines.  Any problems you or family members have had with anesthetic medicines.  Any blood disorders you have.  Any surgeries you have had.  Any medical conditions you have,  such as kidney failure.  Whether you are pregnant or may be pregnant. What are the risks? Generally, this is a safe procedure. However, problems may occur, including:  Infection.  Bruising and bleeding at the catheter insertion site.  Bleeding into the chest, especially into the sac that surrounds the heart. This is a serious complication.  Stroke or blood clots.  Damage to other structures or organs.  Allergic reaction to medicines or dyes.  Need for a permanent pacemaker if the normal electrical system is damaged. A pacemaker is a small computer that sends electrical signals to the heart and helps your heart beat normally.  The procedure not being fully effective. This may not be recognized until months later. Repeat ablation procedures are sometimes required.  What happens before the procedure?  Follow instructions from your health care provider about eating or drinking restrictions.  Ask your health care provider about: ? Changing or stopping your regular medicines. This is especially important if you are taking diabetes medicines or blood thinners. ? Taking medicines such as aspirin and ibuprofen. These medicines can thin your blood. Do not take these medicines before your procedure if your health care provider instructs you not to.  Plan to have someone take you home from the hospital or clinic.  If you will be going home right after the procedure, plan to have someone with you for 24 hours. What happens during the procedure?  To lower your risk of infection: ? Your health care team will wash or sanitize their hands. ? Your skin will be washed with soap. ? Hair may be removed from the incision area.  An IV tube will be inserted into one of your veins.  You will be given a medicine to help you relax (sedative).  The skin on your neck or groin will be numbed.  An incision will be made in your neck or your groin.  A needle will be inserted through the incision and  into a large vein in your neck or groin.  A catheter will be inserted into the needle and moved to your heart.  Dye may be injected through the catheter to help your surgeon see the area of the heart that needs treatment.  Electrical currents will be sent from the catheter to ablate heart tissue in desired areas. There are three types of energy that may be used to ablate heart tissue: ? Heat (radiofrequency energy). ? Laser energy. ? Extreme cold (cryoablation).  When the necessary tissue has been ablated, the catheter will be removed.  Pressure will be held on the catheter insertion area to prevent excessive bleeding.  A bandage (dressing) will be placed over the catheter insertion area. The procedure may vary among health care providers and hospitals. What happens after the procedure?  Your blood pressure, heart rate, breathing rate, and blood oxygen level will be monitored until the medicines you were given have worn off.  Your catheter insertion area will be monitored for bleeding. You  will need to lie still for a few hours to ensure that you do not bleed from the catheter insertion area.  Do not drive for 24 hours or as long as directed by your health care provider. Summary  Cardiac ablation is a procedure to disable (ablate) a small amount of heart tissue in very specific places. Ablating some of the problem areas can improve the heart rhythm or return it to normal.  During the procedure, electrical currents will be sent from the catheter to ablate heart tissue in desired areas. This information is not intended to replace advice given to you by your health care provider. Make sure you discuss any questions you have with your health care provider. Document Released: 03/01/2009 Document Revised: 09/01/2016 Document Reviewed: 09/01/2016 Elsevier Interactive Patient Education  [employer].

## 2018-05-03 NOTE — Progress Notes (Signed)
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  Since last being seen in our clinic, the patient reports doing very well.  Today, he denies symptoms of palpitations, chest pain, shortness of breath,  lower extremity edema, dizziness, presyncope, or syncope.  The patient is otherwise without complaint today.   Past Medical History:  Diagnosis Date  . Acute pain of left knee 12/25/2017  . Acute pain of right knee 08/03/2017  . Ankle pain 05/19/2012  . Benign prostatic hyperplasia without urinary obstruction 06/17/2014  . BPPV (benign paroxysmal positional vertigo) 07/01/2011  . Chronic hyperkalemia 05/20/2012   Overview:  Hx of for years  . Concussion syndrome 05/15/2011   This seems like a moderately severe concussion with at least 1 minute loss of consciousness in 15-20 minutes of significant confusion including being unable to state his name   . DEGENERATIVE JOINT DISEASE, HANDS 10/16/2009   Qualifier: Diagnosis of  By: [name], [name]    . Diverticulosis 06/20/2017  . Hemorrhoids, external 06/20/2017  . Hip pain, left 02/23/2014   Today this primarily appears like a gluteus medius syndrome with a secondary greater trochanteric bursitis   . Inguinal pain, right 08/04/2008   Qualifier: Diagnosis of  By: Hassell Done MD, Mary   US shows hypoechoic change but findings are not clearly diagnostic  Clinically seems like sports hernia  . INJURY NOS, HIP/THIGH 12/31/2006   Qualifier: Diagnosis of  By: [name], [name]    . Left ankle sprain 05/19/2012   This is associated with small avulsion fleck from talus  Note that his RT ankle did have surgery for old ankle fracutre   . Left shoulder pain 07/14/2017  . Lumbar spondylosis 06/20/2017   Fairly significant L4-L5 facet arthropathy with effusion.  Can consider facet injections and potentially RFA down the road.  . Muscle cramping 06/20/2017  . NEUROPATHY 03/01/2008   Qualifier: Diagnosis of  By: [name], [name]    . Pain of left heel 02/22/2015   On [place] this looks like a contusion with an organized hematoma   . Prostatitis   . Right inguinal hernia 06/17/2014  . Sacroiliac joint dysfunction of left side 04/05/2014   Most of his SI joint dysfunction has resolved   . SHOULDER PAIN, RIGHT 08/04/2008   Qualifier: Diagnosis of  By: Hassell Done MD, Stanton Kidney    . Strain of gluteus medius 01/29/2017  . SYMPTOM, PAIN, ABDOMINAL, RIGHT UP QUADRANT 12/31/2006   Qualifier: Diagnosis of  By: [name], [name]    . Vertigo    Past Surgical History:  Procedure Laterality Date  . ANKLE ARTHROSCOPY    . KNEE ARTHROSCOPY      ROS- all systems are reviewed and negatives except as per HPI above  Current Outpatient Medications  Medication Sig Dispense Refill  . aspirin EC 81 MG tablet Take 81 mg by mouth daily.    . diazepam (VALIUM) 5 MG tablet Take 1 tablet (5 mg total) by mouth daily as needed for anxiety. 30 tablet 1  . Multiple Vitamins-Minerals (MULTIVITAMIN WITH MINERALS) tablet Take 1 tablet by mouth daily as needed.     . traMADol (ULTRAM) 50 MG tablet Take by mouth every 6 (six) hours as needed.    . Vitamin D, Ergocalciferol, 2000 units CAPS Take 2,000 Units by mouth daily as needed.     . rivaroxaban (XARELTO) 20 MG TABS tablet Take  1 tablet (20 mg total) by mouth daily with supper. (Patient not taking: Reported on 05/03/2018) 30 tablet 11   No current facility-administered medications for this visit.     Physical Exam: Vitals:   05/03/18 1236  BP: 124/62  Pulse: (!) 48  Weight: 148 lb (67.1 kg)  Height: 5\' 7"  (1.702 m)    GEN- The patient is well appearing, alert and oriented x 3 today.   Head- normocephalic, atraumatic Eyes-  Sclera clear, conjunctiva pink Ears- hearing intact Oropharynx- clear Lungs- Clear to ausculation bilaterally, normal work of breathing Heart- Regular rate and rhythm, no murmurs, rubs or gallops, PMI not laterally displaced GI- soft,  NT, ND, + BS Extremities- no clubbing, cyanosis, or edema  Wt Readings from Last 3 Encounters:  05/03/18 148 lb (67.1 kg)  04/22/18 147 lb (66.7 kg)  03/24/18 147 lb (66.7 kg)    EKG tracing ordered today is personally reviewed and shows sinus bradycardia 48 bpm, PR 178 msec, QRS 90 msec, Qtc 385 msec  Assessment and Plan:  1. Paroxysmal atrial fibrillation The patient has symptomatic, recurrent vagally mediated atrial fibrillation. Not a candidate for medical therapy given significant bradycardia Chads2vasc score is 1.   Therapeutic strategies for afib including ablation were discussed in detail with the patient today. Risk, benefits, and alternatives to EP study and radiofrequency ablation for afib were also discussed in detail today. These risks include but are not limited to stroke, bleeding, vascular damage, tamponade, perforation, damage to the esophagus, lungs, and other structures, pulmonary vein stenosis, worsening renal function, and death. The patient understands these risk and wishes to proceed.  We will therefore proceed with catheter ablation at the next available time.  Carto, ICE, anesthesia are requested for the procedure.  Will also obtain Cardiac CT prior to the procedure to exclude LAA thrombus and further evaluate atrial anatomy.    Thompson Grayer MD, [facility] 05/03/2018 12:45 PM

## 2018-05-04 LAB — CBC WITH DIFFERENTIAL/PLATELET
BASOS: 0 %
Basophils Absolute: 0 10*3/uL (ref 0.0–0.2)
EOS (ABSOLUTE): 0.1 10*3/uL (ref 0.0–0.4)
Eos: 1 %
Hematocrit: 42 % (ref 37.5–51.0)
Hemoglobin: 13.9 g/dL (ref 13.0–17.7)
IMMATURE GRANS (ABS): 0.1 10*3/uL (ref 0.0–0.1)
Immature Granulocytes: 1 %
LYMPHS ABS: 1.9 10*3/uL (ref 0.7–3.1)
LYMPHS: 22 %
MCH: 30.1 pg (ref 26.6–33.0)
MCHC: 33.1 g/dL (ref 31.5–35.7)
MCV: 91 fL (ref 79–97)
MONOS ABS: 0.7 10*3/uL (ref 0.1–0.9)
Monocytes: 8 %
NEUTROS ABS: 5.9 10*3/uL (ref 1.4–7.0)
Neutrophils: 68 %
PLATELETS: 221 10*3/uL (ref 150–450)
RBC: 4.62 x10E6/uL (ref 4.14–5.80)
RDW: 13.4 % (ref 12.3–15.4)
WBC: 8.6 10*3/uL (ref 3.4–10.8)

## 2018-05-04 LAB — BASIC METABOLIC PANEL
BUN / CREAT RATIO: 21 (ref 10–24)
BUN: 18 mg/dL (ref 8–27)
CHLORIDE: 104 mmol/L (ref 96–106)
CO2: 25 mmol/L (ref 20–29)
Calcium: 9.4 mg/dL (ref 8.6–10.2)
Creatinine, Ser: 0.87 mg/dL (ref 0.76–1.27)
GFR calc non Af Amer: 89 mL/min/{1.73_m2} (ref >59)
GFR, EST AFRICAN AMERICAN: 103 mL/min/{1.73_m2} (ref >59)
Glucose: 80 mg/dL (ref 65–99)
POTASSIUM: 5.1 mmol/L (ref 3.5–5.2)
SODIUM: 142 mmol/L (ref 134–144)

## 2018-05-05 ENCOUNTER — Ambulatory Visit: Payer: PPO | Admitting: Sports Medicine

## 2018-05-13 ENCOUNTER — Encounter: Payer: Self-pay | Admitting: Internal Medicine

## 2018-05-17 ENCOUNTER — Encounter: Payer: Self-pay | Admitting: Family Medicine

## 2018-05-17 ENCOUNTER — Encounter: Payer: Self-pay | Admitting: Internal Medicine

## 2018-05-19 ENCOUNTER — Ambulatory Visit (HOSPITAL_COMMUNITY)
Admission: RE | Admit: 2018-05-19 | Discharge: 2018-05-19 | Disposition: A | Discharge status: Home / Self Care | Payer: PPO | Source: Ambulatory Visit | Attending: Internal Medicine | Admitting: Internal Medicine

## 2018-05-19 ENCOUNTER — Ambulatory Visit (HOSPITAL_COMMUNITY): Admission: RE | Admit: 2018-05-19 | Payer: PPO | Source: Ambulatory Visit

## 2018-05-19 DIAGNOSIS — I4891 Unspecified atrial fibrillation: Secondary | ICD-10-CM | POA: Diagnosis not present

## 2018-05-19 DIAGNOSIS — I480 Paroxysmal atrial fibrillation: Secondary | ICD-10-CM | POA: Diagnosis not present

## 2018-05-19 MED ORDER — IOPAMIDOL (ISOVUE-370) INJECTION 76%
100.0000 mL | Freq: Once | INTRAVENOUS | Status: AC | PRN
  Administered 2018-05-19: 80 mL via INTRAVENOUS

## 2018-05-19 MED ORDER — IOPAMIDOL (ISOVUE-370) INJECTION 76%
INTRAVENOUS | Status: AC
  Filled 2018-05-19: qty 100

## 2018-05-25 ENCOUNTER — Encounter (HOSPITAL_COMMUNITY): Payer: Self-pay | Admitting: Certified Registered Nurse Anesthetist

## 2018-05-25 ENCOUNTER — Ambulatory Visit (HOSPITAL_COMMUNITY): Payer: PPO | Admitting: Certified Registered Nurse Anesthetist

## 2018-05-25 ENCOUNTER — Encounter (HOSPITAL_COMMUNITY)
Admission: RE | Disposition: A | Discharge status: Home / Self Care | Payer: Self-pay | Source: Ambulatory Visit | Attending: Internal Medicine

## 2018-05-25 ENCOUNTER — Ambulatory Visit (HOSPITAL_COMMUNITY)
Admission: RE | Admit: 2018-05-25 | Discharge: 2018-05-25 | Disposition: A | Discharge status: Home / Self Care | Payer: PPO | Source: Ambulatory Visit | Attending: Internal Medicine | Admitting: Internal Medicine

## 2018-05-25 ENCOUNTER — Other Ambulatory Visit: Payer: Self-pay

## 2018-05-25 DIAGNOSIS — Z7982 Long term (current) use of aspirin: Secondary | ICD-10-CM | POA: Diagnosis not present

## 2018-05-25 DIAGNOSIS — I480 Paroxysmal atrial fibrillation: Secondary | ICD-10-CM | POA: Diagnosis not present

## 2018-05-25 DIAGNOSIS — Z7901 Long term (current) use of anticoagulants: Secondary | ICD-10-CM | POA: Insufficient documentation

## 2018-05-25 DIAGNOSIS — I4891 Unspecified atrial fibrillation: Secondary | ICD-10-CM | POA: Diagnosis not present

## 2018-05-25 HISTORY — PX: ATRIAL FIBRILLATION ABLATION: EP1191

## 2018-05-25 HISTORY — DX: Paroxysmal atrial fibrillation: I48.0

## 2018-05-25 LAB — POCT ACTIVATED CLOTTING TIME
ACTIVATED CLOTTING TIME: 335 s
ACTIVATED CLOTTING TIME: 186 s
Activated Clotting Time: 224 seconds
Activated Clotting Time: 257 seconds

## 2018-05-25 SURGERY — ATRIAL FIBRILLATION ABLATION
Anesthesia: General

## 2018-05-25 MED ORDER — ROCURONIUM BROMIDE 50 MG/5ML IV SOSY
PREFILLED_SYRINGE | INTRAVENOUS | Status: DC | PRN
  Administered 2018-05-25: 70 mg via INTRAVENOUS

## 2018-05-25 MED ORDER — FENTANYL CITRATE (PF) 100 MCG/2ML IJ SOLN
INTRAMUSCULAR | Status: DC | PRN
  Administered 2018-05-25: 25 ug via INTRAVENOUS
  Administered 2018-05-25 (×2): 50 ug via INTRAVENOUS

## 2018-05-25 MED ORDER — PROPOFOL 10 MG/ML IV BOLUS
INTRAVENOUS | Status: DC | PRN
  Administered 2018-05-25: 180 mg via INTRAVENOUS

## 2018-05-25 MED ORDER — IOPAMIDOL (ISOVUE-370) INJECTION 76%
INTRAVENOUS | Status: AC
  Filled 2018-05-25: qty 50

## 2018-05-25 MED ORDER — IOPAMIDOL (ISOVUE-370) INJECTION 76%
INTRAVENOUS | Status: DC | PRN
  Administered 2018-05-25: 2 mL via INTRAVENOUS

## 2018-05-25 MED ORDER — DIAZEPAM 5 MG PO TABS: 5.0000 mg | ORAL_TABLET | Freq: Every day | ORAL | Status: DC | PRN

## 2018-05-25 MED ORDER — BUPIVACAINE HCL (PF) 0.25 % IJ SOLN
INTRAMUSCULAR | Status: DC | PRN
  Administered 2018-05-25: 30 mL

## 2018-05-25 MED ORDER — TRAMADOL HCL 50 MG PO TABS: 50.0000 mg | ORAL_TABLET | Freq: Four times a day (QID) | ORAL | Status: DC | PRN

## 2018-05-25 MED ORDER — HEPARIN SODIUM (PORCINE) 1000 UNIT/ML IJ SOLN
INTRAMUSCULAR | Status: DC | PRN
  Administered 2018-05-25: 2000 [IU] via INTRAVENOUS
  Administered 2018-05-25: 7000 [IU] via INTRAVENOUS
  Administered 2018-05-25: 5000 [IU] via INTRAVENOUS

## 2018-05-25 MED ORDER — EPHEDRINE SULFATE-NACL 50-0.9 MG/10ML-% IV SOSY
PREFILLED_SYRINGE | INTRAVENOUS | Status: DC | PRN
  Administered 2018-05-25 (×2): 10 mg via INTRAVENOUS

## 2018-05-25 MED ORDER — LIDOCAINE 2% (20 MG/ML) 5 ML SYRINGE
INTRAMUSCULAR | Status: DC | PRN
  Administered 2018-05-25: 100 mg via INTRAVENOUS

## 2018-05-25 MED ORDER — HYDROCODONE-ACETAMINOPHEN 5-325 MG PO TABS: 1.0000 | ORAL_TABLET | ORAL | Status: DC | PRN

## 2018-05-25 MED ORDER — MIDAZOLAM HCL 2 MG/2ML IJ SOLN
INTRAMUSCULAR | Status: DC | PRN
  Administered 2018-05-25: 2 mg via INTRAVENOUS

## 2018-05-25 MED ORDER — HEPARIN (PORCINE) IN NACL 1000-0.9 UT/500ML-% IV SOLN
INTRAVENOUS | Status: AC
  Filled 2018-05-25: qty 500

## 2018-05-25 MED ORDER — PANTOPRAZOLE SODIUM 40 MG PO TBEC: 40.0000 mg | DELAYED_RELEASE_TABLET | Freq: Every day | ORAL | 0 refills | Status: DC

## 2018-05-25 MED ORDER — SODIUM CHLORIDE 0.9% FLUSH: 3.0000 mL | Freq: Two times a day (BID) | INTRAVENOUS | Status: DC

## 2018-05-25 MED ORDER — RIVAROXABAN 20 MG PO TABS
20.0000 mg | ORAL_TABLET | Freq: Every day | ORAL | Status: DC
  Administered 2018-05-25: 20 mg via ORAL
  Filled 2018-05-25: qty 1

## 2018-05-25 MED ORDER — SODIUM CHLORIDE 0.9 % IV SOLN: 250.0000 mL | INTRAVENOUS | Status: DC | PRN

## 2018-05-25 MED ORDER — ISOPROTERENOL HCL 0.2 MG/ML IJ SOLN
INTRAMUSCULAR | Status: AC
  Filled 2018-05-25: qty 5

## 2018-05-25 MED ORDER — BUPIVACAINE HCL (PF) 0.25 % IJ SOLN
INTRAMUSCULAR | Status: AC
  Filled 2018-05-25: qty 30

## 2018-05-25 MED ORDER — DEXAMETHASONE SODIUM PHOSPHATE 10 MG/ML IJ SOLN
INTRAMUSCULAR | Status: DC | PRN
  Administered 2018-05-25: 10 mg via INTRAVENOUS

## 2018-05-25 MED ORDER — HEPARIN SODIUM (PORCINE) 1000 UNIT/ML IJ SOLN
INTRAMUSCULAR | Status: DC | PRN
  Administered 2018-05-25 (×2): 1000 [IU] via INTRAVENOUS

## 2018-05-25 MED ORDER — ONDANSETRON HCL 4 MG/2ML IJ SOLN: 4.0000 mg | Freq: Four times a day (QID) | INTRAMUSCULAR | Status: DC | PRN

## 2018-05-25 MED ORDER — PROTAMINE SULFATE 10 MG/ML IV SOLN
INTRAVENOUS | Status: DC | PRN
  Administered 2018-05-25: 30 mg via INTRAVENOUS
  Administered 2018-05-25: 10 mg via INTRAVENOUS

## 2018-05-25 MED ORDER — DEXTROSE 5 % IV SOLN
INTRAVENOUS | Status: DC | PRN
  Administered 2018-05-25: 20 ug/min via INTRAVENOUS

## 2018-05-25 MED ORDER — SUGAMMADEX SODIUM 200 MG/2ML IV SOLN
INTRAVENOUS | Status: DC | PRN
  Administered 2018-05-25: 100 mg via INTRAVENOUS

## 2018-05-25 MED ORDER — ACETAMINOPHEN 325 MG PO TABS: 650.0000 mg | ORAL_TABLET | ORAL | Status: DC | PRN

## 2018-05-25 MED ORDER — HEPARIN SODIUM (PORCINE) 1000 UNIT/ML IJ SOLN
INTRAMUSCULAR | Status: AC
  Filled 2018-05-25: qty 2

## 2018-05-25 MED ORDER — ONDANSETRON HCL 4 MG/2ML IJ SOLN
INTRAMUSCULAR | Status: DC | PRN
  Administered 2018-05-25: 4 mg via INTRAVENOUS

## 2018-05-25 MED ORDER — HEPARIN (PORCINE) IN NACL 1000-0.9 UT/500ML-% IV SOLN
INTRAVENOUS | Status: DC | PRN
  Administered 2018-05-25: 500 mL

## 2018-05-25 MED ORDER — SODIUM CHLORIDE 0.9 % IV SOLN
INTRAVENOUS | Status: DC
  Administered 2018-05-25 (×2): via INTRAVENOUS

## 2018-05-25 MED ORDER — SODIUM CHLORIDE 0.9% FLUSH: 3.0000 mL | INTRAVENOUS | Status: DC | PRN

## 2018-05-25 SURGICAL SUPPLY — 18 items
BAG SNAP BAND KOVER 36X36 (MISCELLANEOUS) ×1 IMPLANT
CATH MAPPNG PENTARAY F 2-6-2MM (CATHETERS) IMPLANT
CATH NAVISTAR SMARTTOUCH DF (ABLATOR) ×1 IMPLANT
CATH SOUNDSTAR 3D IMAGING (CATHETERS) ×1 IMPLANT
CATH WEBSTER BI DIR CS D-F CRV (CATHETERS) ×1 IMPLANT
COVER SWIFTLINK CONNECTOR (BAG) ×1 IMPLANT
NDL BAYLIS TRANSSEPTAL 71CM (NEEDLE) IMPLANT
NEEDLE BAYLIS TRANSSEPTAL 71CM (NEEDLE) ×2 IMPLANT
PACK EP LATEX FREE (CUSTOM PROCEDURE TRAY) ×2
PACK EP LF (CUSTOM PROCEDURE TRAY) ×1 IMPLANT
PAD DEFIB LIFELINK (PAD) ×1 IMPLANT
PATCH CARTO3 (PAD) ×1 IMPLANT
PENTARAY F 2-6-2MM (CATHETERS) ×2
SHEATH AVANTI 11F 11CM (SHEATH) ×1 IMPLANT
SHEATH PINNACLE 7F 10CM (SHEATH) ×1 IMPLANT
SHEATH PINNACLE 9F 10CM (SHEATH) ×1 IMPLANT
SHEATH SWARTZ TS SL2 63CM 8.5F (SHEATH) ×1 IMPLANT
TUBING SMART ABLATE COOLFLOW (TUBING) ×1 IMPLANT

## 2018-05-25 NOTE — Progress Notes (Signed)
Patient ambulated in hall once bedrest over. R groin Site checked. No new bleeding. Pulses in feet good. Patient denies any symptoms. IV  Removed. Discharge instructions gone over and given to patient.

## 2018-05-25 NOTE — Transfer of Care (Signed)
Immediate Anesthesia Transfer of Care Note  Patient: William Underwood  Procedure(s) Performed: ATRIAL FIBRILLATION ABLATION (N/A )  Patient Location: Cath Lab  Anesthesia Type:General  Level of Consciousness: awake, alert  and oriented  Airway & Oxygen Therapy: Patient Spontanous Breathing and Patient connected to face mask oxygen  Post-op Assessment: Report given to RN and Post -op Vital signs reviewed and stable  Post vital signs: Reviewed and stable  Last Vitals:  Vitals Value Taken Time  BP 124/72 05/25/2018  2:00 PM  Temp    Pulse 59 05/25/2018  2:04 PM  Resp 19 05/25/2018  2:04 PM  SpO2 97 % 05/25/2018  2:04 PM  Vitals shown include unvalidated device data.  Last Pain:  Vitals:   05/25/18 1351  TempSrc: Temporal  PainSc: 0-No pain         Complications: No apparent anesthesia complications

## 2018-05-25 NOTE — Progress Notes (Signed)
Site area: Right groin a 7,9, and 11 french venous sheath was removed  Site Prior to Removal:  Level 0  Pressure Applied For 20 MINUTES    Bedrest Beginning at 1440p  Manual:   Yes.    Patient Status During Pull:  stable  Post Pull Groin Site:  Level 0  Post Pull Instructions Given:  Yes.    Post Pull Pulses Present:  Yes.    Dressing Applied:  Yes.    Comments:  VS remain stable

## 2018-05-25 NOTE — Progress Notes (Signed)
Pt had 10 beats run of SVT.  Pt states he felt it a little bit.  Has mild discomfort with deep breathing.  Will continue to monitor.  Idolina Primer, RN

## 2018-05-25 NOTE — Discharge Instructions (Signed)
Post procedure care instructions No driving for 4 days. No lifting over 5 lbs for 1 week. No vigorous or sexual activity for 1 week. You may return to work on 06/01/18. Keep procedure site clean & dry. If you notice increased pain, swelling, bleeding or pus, call/return!  You may shower, but no soaking baths/hot tubs/pools for 1 week.    You have an appointment set up with the [facility].  Multiple studies have shown that being followed by a dedicated atrial fibrillation clinic in addition to the standard care you receive from your other physicians improves health. We believe that enrollment in the atrial fibrillation clinic will allow [place] to better care for you.   The phone number to the [facility] is [phone]. The clinic is staffed Monday through Friday from 8:30am to 5pm.  Parking Directions: The clinic is located in the Heart and Vascular Building connected to [facility]. 1)From [address] turn on to [address] and go to the 3rd entrance  (Heart and Vascular entrance) on the right. 2)Look to the right for Heart &Vascular Parking Garage. 3)A code for the entrance is required please call the clinic to receive this.   4)Take the elevators to the 1st floor. Registration is in the room with the glass walls at the end of the hallway.  If you have any trouble parking or locating the clinic, please dont hesitate to call [phone].

## 2018-05-25 NOTE — Anesthesia Procedure Notes (Signed)
Procedure Name: Intubation Date/Time: 05/25/2018 11:00 AM Performed by: Genelle Bal, CRNA Pre-anesthesia Checklist: Patient identified, Emergency Drugs available, Suction available and Patient being monitored Patient Re-evaluated:Patient Re-evaluated prior to induction Oxygen Delivery Method: Circle system utilized Preoxygenation: Pre-oxygenation with 100% oxygen Induction Type: IV induction Ventilation: Mask ventilation without difficulty Laryngoscope Size: Miller and 2 Grade View: Grade I Tube type: Oral Tube size: 7.5 mm Number of attempts: 1 Airway Equipment and Method: Stylet Placement Confirmation: ETT inserted through vocal cords under direct vision,  positive ETCO2 and breath sounds checked- equal and bilateral Secured at: 21 cm Tube secured with: Tape Dental Injury: Teeth and Oropharynx as per pre-operative assessment

## 2018-05-25 NOTE — Anesthesia Preprocedure Evaluation (Addendum)
Anesthesia Evaluation  Patient identified by MRN, date of birth, ID band Patient awake    Reviewed: Allergy & Precautions, NPO status , Patient's Chart, lab work & pertinent test results  History of Anesthesia Complications Negative for: history of anesthetic complications  Airway Mallampati: II  TM Distance: >3 FB Neck ROM: Full    Dental no notable dental hx. (+) Dental Advisory Given   Pulmonary neg pulmonary ROS,    Pulmonary exam normal        Cardiovascular Normal cardiovascular exam+ dysrhythmias Atrial Fibrillation      Neuro/Psych negative neurological ROS  negative psych ROS   GI/Hepatic negative GI ROS, Neg liver ROS,   Endo/Other  negative endocrine ROS  Renal/GU negative Renal ROS  negative genitourinary   Musculoskeletal negative musculoskeletal ROS (+)   Abdominal   Peds negative pediatric ROS (+)  Hematology negative hematology ROS (+)   Anesthesia Other Findings   Reproductive/Obstetrics negative OB ROS                            Anesthesia Physical Anesthesia Plan  ASA: II  Anesthesia Plan: General   Post-op Pain Management:    Induction: Intravenous  PONV Risk Score and Plan: 2 and Ondansetron and Dexamethasone  Airway Management Planned: Oral ETT  Additional Equipment:   Intra-op Plan:   Post-operative Plan: Extubation in OR  Informed Consent: I have reviewed the patients History and Physical, chart, labs and discussed the procedure including the risks, benefits and alternatives for the proposed anesthesia with the patient or authorized representative who has indicated his/her understanding and acceptance.   Dental advisory given  Plan Discussed with: CRNA and Anesthesiologist  Anesthesia Plan Comments:        Anesthesia Quick Evaluation

## 2018-05-25 NOTE — Interval H&P Note (Signed)
History and Physical Interval Note:  05/25/2018 10:56 AM  William Underwood  has presented today for surgery, with the diagnosis of afib  The various methods of treatment have been discussed with the patient and family. After consideration of risks, benefits and other options for treatment, the patient has consented to  Procedure(s): ATRIAL FIBRILLATION ABLATION (N/A) as a surgical intervention .  The patient's history has been reviewed, patient examined, no change in status, stable for surgery.  I have reviewed the patient's chart and labs.  Questions were answered to the patient's satisfaction.    Reports compliance with xarelto without interruption.  Cardiac CT results reviewed with him in detail today.  Thompson Grayer

## 2018-05-25 NOTE — Discharge Summary (Signed)
ELECTROPHYSIOLOGY PROCEDURE DISCHARGE SUMMARY    Patient ID: William Underwood,  MRN: [id number], DOB/AGE: 1951-05-14 67 y.o.  Admit date: 05/25/2018 Discharge date: 05/25/2018  Electrophysiologist: Thompson Grayer, MD  Primary Discharge Diagnosis:  Paroxysmal atrial fibrillation   Procedures This Admission:  1.  Electrophysiology study and radiofrequency catheter ablation on 05/25/18 by Dr Thompson Grayer.  This study demonstrated successful PVI and ablation along the SVC/RA junction.    Brief HPI: William Underwood is a 67 y.o. male with a history of paroxysmal atrial fibrillation.  Medical therapy has been limited by bradycardia. Risks, benefits, and alternatives to catheter ablation of atrial fibrillation were reviewed with the patient who wished to proceed.  The patient underwent TEE prior to the procedure which demonstrated normal LV function and no LAA thrombus.    Hospital Course:  The patient was admitted and underwent EPS/RFCA of atrial fibrillation with details as outlined above.  They were monitored on telemetry which demonstrated sinus rhythm.  Groin was without complication on the day of discharge.  The patient was examined and considered to be stable for discharge.  Wound care and restrictions were reviewed with the patient.  The patient will be seen back by Roderic Palau, NP in 4 weeks and [name] in 12 weeks for post ablation follow up.   This patients CHA2DS2-VASc Score and unadjusted Ischemic Stroke Rate (% per year) is equal to 0.6 % stroke rate/year from a score of 1  Above score calculated as 1 point each if present [CHF, HTN, DM, Vascular=MI/PAD/Aortic Plaque, Age if 65-74, or Male] Above score calculated as 2 points each if present [Age > 75, or Stroke/TIA/TE]    Physical Exam: Vitals:   05/25/18 1518 05/25/18 1525 05/25/18 1543 05/25/18 1700  BP: 130/71 124/68 112/78 122/72  Pulse: 62 61 62 (!) 56  Resp: 13 20 19 19   Temp:   97.6 F (36.4 C)   TempSrc:    Oral   SpO2: 97% 96% 96% 96%  Weight:      Height:   5\' 7"  (1.702 m)     GEN- The patient is well appearing, alert and oriented x 3 today.   HEENT: normocephalic, atraumatic; sclera clear, conjunctiva pink; hearing intact; oropharynx clear; neck supple  Lungs- Clear to ausculation bilaterally, normal work of breathing.  No wheezes, rales, rhonchi Heart- Regular rate and rhythm, no murmurs, rubs or gallops  GI- soft, non-tender, non-distended, bowel sounds present  Extremities- no clubbing, cyanosis, or edema; DP/PT/radial pulses 2+ bilaterally, groin without hematoma/bruit MS- no significant deformity or atrophy Skin- warm and dry, no rash or lesion Psych- euthymic mood, full affect Neuro- strength and sensation are intact   Labs:   Lab Results  Component Value Date   WBC 8.6 05/03/2018   HGB 13.9 05/03/2018   HCT 42.0 05/03/2018   MCV 91 05/03/2018   PLT 221 05/03/2018   No results for input(s): NA, K, CL, CO2, BUN, CREATININE, CALCIUM, PROT, BILITOT, ALKPHOS, ALT, AST, GLUCOSE in the last 168 hours.  Invalid input(s): LABALBU   Discharge Medications:  Allergies as of 05/25/2018      Reactions   Codeine Other (See Comments)   Sensitive, agitation   Cefdinir Palpitations      Medication List    STOP taking these medications   metoprolol tartrate 50 MG tablet Commonly known as:  LOPRESSOR     TAKE these medications   diazepam 5 MG tablet Commonly known as:  VALIUM Take 1 tablet (  5 mg total) by mouth daily as needed for anxiety.   multivitamin with minerals tablet Take 1 tablet by mouth daily.   pantoprazole 40 MG tablet Commonly known as:  PROTONIX Take 1 tablet (40 mg total) by mouth daily.   rivaroxaban 20 MG Tabs tablet Commonly known as:  XARELTO Take 1 tablet (20 mg total) by mouth daily with supper.   traMADol 50 MG tablet Commonly known as:  ULTRAM Take 50 mg by mouth every 6 (six) hours as needed for moderate pain.   Vitamin D (Ergocalciferol)  2000 units Caps Take 2,000 Units by mouth daily.       Disposition:   Follow-up Information    [facility] Follow up on 06/21/2018.   Specialty:  Cardiology Why:  10:00AM Contact information: [address] [id number] [place] [phone]       Thompson Grayer, MD Follow up on 09/01/2018.   Specialty:  Cardiology Why:  12:00PM (noon) Contact information: [place] [address] [phone]           Duration of Discharge Encounter: Greater than 30 minutes including physician time.  Army Fossa MD 05/25/2018 5:45 PM

## 2018-05-25 NOTE — Anesthesia Postprocedure Evaluation (Signed)
Anesthesia Post Note  Patient: William Underwood  Procedure(s) Performed: ATRIAL FIBRILLATION ABLATION (N/A )     Patient location during evaluation: Cath Lab Anesthesia Type: General Level of consciousness: awake and alert Pain management: pain level controlled Vital Signs Assessment: post-procedure vital signs reviewed and stable Respiratory status: spontaneous breathing, nonlabored ventilation, respiratory function stable and patient connected to nasal cannula oxygen Cardiovascular status: blood pressure returned to baseline and stable Postop Assessment: no apparent nausea or vomiting Anesthetic complications: no    Last Vitals:  Vitals:   05/25/18 1543 05/25/18 1700  BP: 112/78 122/72  Pulse: 62 (!) 56  Resp: 19 19  Temp: 36.4 C   SpO2: 96% 96%    Last Pain:  Vitals:   05/25/18 1700  TempSrc:   PainSc: 0-No pain                 Nakai Pollio COKER

## 2018-05-25 NOTE — Progress Notes (Signed)
Doing well s/p ablation VSS No concerns Exam is benign.  DC to home.  Thompson Grayer MD, [facility] 05/25/2018 5:43 PM

## 2018-05-26 ENCOUNTER — Encounter (HOSPITAL_COMMUNITY): Payer: Self-pay | Admitting: Internal Medicine

## 2018-05-28 ENCOUNTER — Other Ambulatory Visit: Payer: Self-pay

## 2018-05-28 ENCOUNTER — Encounter: Payer: Self-pay | Admitting: Internal Medicine

## 2018-05-28 DIAGNOSIS — I480 Paroxysmal atrial fibrillation: Secondary | ICD-10-CM

## 2018-05-28 MED ORDER — RIVAROXABAN 20 MG PO TABS: 20.0000 mg | ORAL_TABLET | Freq: Every day | ORAL | 11 refills | Status: DC

## 2018-06-03 ENCOUNTER — Ambulatory Visit: Payer: PPO | Admitting: Sports Medicine

## 2018-06-17 ENCOUNTER — Other Ambulatory Visit: Payer: Self-pay

## 2018-06-17 MED ORDER — FUROSEMIDE 40 MG PO TABS: ORAL_TABLET | ORAL | 0 refills | Status: DC

## 2018-06-17 NOTE — Progress Notes (Signed)
Per Dr. Rayann Heman- Take lasix 40 mg one tablet by mouth daily x 3 days.  Pt to follow up with afib clinic next Tuesday.

## 2018-06-21 ENCOUNTER — Encounter (HOSPITAL_COMMUNITY): Payer: Self-pay | Admitting: Nurse Practitioner

## 2018-06-21 ENCOUNTER — Ambulatory Visit (HOSPITAL_COMMUNITY): Payer: PPO | Admitting: Nurse Practitioner

## 2018-06-21 ENCOUNTER — Ambulatory Visit (HOSPITAL_COMMUNITY)
Admission: RE | Admit: 2018-06-21 | Discharge: 2018-06-21 | Disposition: A | Discharge status: Home / Self Care | Payer: PPO | Source: Ambulatory Visit | Attending: Nurse Practitioner | Admitting: Nurse Practitioner

## 2018-06-21 VITALS — BP 128/82 | HR 65 | Ht 67.0 in | Wt 147.0 lb

## 2018-06-21 DIAGNOSIS — Z885 Allergy status to narcotic agent status: Secondary | ICD-10-CM | POA: Insufficient documentation

## 2018-06-21 DIAGNOSIS — Z8489 Family history of other specified conditions: Secondary | ICD-10-CM | POA: Insufficient documentation

## 2018-06-21 DIAGNOSIS — N400 Enlarged prostate without lower urinary tract symptoms: Secondary | ICD-10-CM | POA: Insufficient documentation

## 2018-06-21 DIAGNOSIS — Z7901 Long term (current) use of anticoagulants: Secondary | ICD-10-CM | POA: Diagnosis not present

## 2018-06-21 DIAGNOSIS — Z79899 Other long term (current) drug therapy: Secondary | ICD-10-CM | POA: Insufficient documentation

## 2018-06-21 DIAGNOSIS — Z9889 Other specified postprocedural states: Secondary | ICD-10-CM | POA: Insufficient documentation

## 2018-06-21 DIAGNOSIS — Z881 Allergy status to other antibiotic agents status: Secondary | ICD-10-CM | POA: Diagnosis not present

## 2018-06-21 DIAGNOSIS — Z8719 Personal history of other diseases of the digestive system: Secondary | ICD-10-CM | POA: Diagnosis not present

## 2018-06-21 DIAGNOSIS — I480 Paroxysmal atrial fibrillation: Secondary | ICD-10-CM | POA: Diagnosis not present

## 2018-06-21 DIAGNOSIS — Z79891 Long term (current) use of opiate analgesic: Secondary | ICD-10-CM | POA: Diagnosis not present

## 2018-06-21 NOTE — Progress Notes (Signed)
Primary Care Physician: Briscoe Deutscher, DO Referring Physician: Dr. Wendie Agreste William Underwood is a 67 y.o. male with a h/o paroxysmal afib that is in the afib clinic for evaluation one month after ablation. He does not report any afib. Has has a intermittent mild cough and still some shortness of breath since the procedure. He has tried to resume his normal running pace and found out that his exercise tolerance is not what it was prior to the ablation.   Today, he denies symptoms of palpitations, chest pain, shortness of breath, orthopnea, PND, lower extremity edema, dizziness, presyncope, syncope, or neurologic sequela. The patient is tolerating medications without difficulties and is otherwise without complaint today.   Past Medical History:  Diagnosis Date  . Benign prostatic hyperplasia without urinary obstruction 06/17/2014  . BPPV (benign paroxysmal positional vertigo) 07/01/2011  . Chronic hyperkalemia 05/20/2012   Overview:  Hx of for years  . Concussion syndrome 05/15/2011   This seems like a moderately severe concussion with at least 1 minute loss of consciousness in 15-20 minutes of significant confusion including being unable to state his name   . DEGENERATIVE JOINT DISEASE, HANDS 10/16/2009   Qualifier: Diagnosis of  By: [name], [name]    . Diverticulosis 06/20/2017  . Hemorrhoids, external 06/20/2017  . Hip pain, left 02/23/2014   Today this primarily appears like a gluteus medius syndrome with a secondary greater trochanteric bursitis   . Inguinal pain, right 08/04/2008   Qualifier: Diagnosis of  By: Hassell Done MD, Mary   US shows hypoechoic change but findings are not clearly diagnostic  Clinically seems like sports hernia  . Left ankle sprain 05/19/2012   This is associated with small avulsion fleck from talus  Note that his RT ankle did have surgery for old ankle fracutre   . Lumbar spondylosis 06/20/2017   Fairly significant L4-L5 facet arthropathy with effusion.  Can consider  facet injections and potentially RFA down the road.  Marland Kitchen NEUROPATHY 03/01/2008   Qualifier: Diagnosis of  By: [name], [name]    . Paroxysmal atrial fibrillation (HCC)   . Right inguinal hernia 06/17/2014  . Strain of gluteus medius 01/29/2017  . Vertigo    Past Surgical History:  Procedure Laterality Date  . ANKLE ARTHROSCOPY    . ATRIAL FIBRILLATION ABLATION N/A 05/25/2018   Procedure: ATRIAL FIBRILLATION ABLATION;  Surgeon: Thompson Grayer, MD;  Location: [facility];  Service: Cardiovascular;  Laterality: N/A;  . KNEE ARTHROSCOPY      Current Outpatient Medications  Medication Sig Dispense Refill  . Multiple Vitamins-Minerals (MULTIVITAMIN WITH MINERALS) tablet Take 1 tablet by mouth daily.     . pantoprazole (PROTONIX) 40 MG tablet Take 1 tablet (40 mg total) by mouth daily. 45 tablet 0  . rivaroxaban (XARELTO) 20 MG TABS tablet Take 1 tablet (20 mg total) by mouth daily with supper. 30 tablet 11  . Vitamin D, Ergocalciferol, 2000 units CAPS Take 2,000 Units by mouth daily.     . diazepam (VALIUM) 5 MG tablet Take 1 tablet (5 mg total) by mouth daily as needed for anxiety. (Patient not taking: Reported on 05/19/2018) 30 tablet 1  . furosemide (LASIX) 40 MG tablet Take one tablet by mouth daily x 3 days (Patient not taking: Reported on 06/21/2018) 3 tablet 0  . traMADol (ULTRAM) 50 MG tablet Take 50 mg by mouth every 6 (six) hours as needed for moderate pain.      No current facility-administered medications for  this encounter.     Allergies  Allergen Reactions  . Codeine Other (See Comments)    Sensitive, agitation  . Cefdinir Palpitations    Social History   Socioeconomic History  . Marital status: Married    Spouse name: Not on file  . Number of children: Not on file  . Years of education: Not on file  . Highest education level: Not on file  Occupational History  . Not on file  Social Needs  . Financial resource strain: Not on file  . Food insecurity:    Worry: Not  on file    Inability: Not on file  . Transportation needs:    Medical: Not on file    Non-medical: Not on file  Tobacco Use  . Smoking status: Never Smoker  . Smokeless tobacco: Never Used  Substance and Sexual Activity  . Alcohol use: No  . Drug use: No  . Sexual activity: Not on file  Lifestyle  . Physical activity:    Days per week: Not on file    Minutes per session: Not on file  . Stress: Not on file  Relationships  . Social connections:    Talks on phone: Not on file    Gets together: Not on file    Attends religious service: Not on file    Active member of club or organization: Not on file    Attends meetings of clubs or organizations: Not on file    Relationship status: Not on file  . Intimate partner violence:    Fear of current or ex partner: Not on file    Emotionally abused: Not on file    Physically abused: Not on file    Forced sexual activity: Not on file  Other Topics Concern  . Not on file  Social History Narrative  . Not on file    Family History  Problem Relation Age of Onset  . Hearing loss Father   . Colon cancer Neg Hx     ROS- All systems are reviewed and negative except as per the HPI above  Physical Exam: Vitals:   06/21/18 1358  BP: 128/82  Pulse: 65  Weight: 66.7 kg  Height: 5\' 7"  (1.702 m)   Wt Readings from Last 3 Encounters:  06/21/18 66.7 kg  05/25/18 65.8 kg  05/03/18 67.1 kg    Labs: Lab Results  Component Value Date   NA 142 05/03/2018   K 5.1 05/03/2018   CL 104 05/03/2018   CO2 25 05/03/2018   GLUCOSE 80 05/03/2018   BUN 18 05/03/2018   CREATININE 0.87 05/03/2018   CALCIUM 9.4 05/03/2018   No results found for: INR Lab Results  Component Value Date   CHOL 143 07/15/2017   HDL 60.80 07/15/2017   LDLCALC 71 07/15/2017   TRIG 55.0 07/15/2017     GEN- The patient is well appearing, alert and oriented x 3 today.   Head- normocephalic, atraumatic Eyes-  Sclera clear, conjunctiva pink Ears- hearing  intact Oropharynx- clear Neck- supple, no JVP Lymph- no cervical lymphadenopathy Lungs- Clear to ausculation bilaterally, normal work of breathing Heart- Regular rate and rhythm, no murmurs, rubs or gallops, PMI not laterally displaced GI- soft, NT, ND, + BS Extremities- no clubbing, cyanosis, or edema MS- no significant deformity or atrophy Skin- no rash or lesion Psych- euthymic mood, full affect Neuro- strength and sensation are intact  EKG-NSR at 65 bpm, pr int at 146 ms, qrs int 86 ms, qtc at 391  ms Epic records reviewed    Assessment and Plan: 1. Paroxysmal afib  Maintaining SR since the ablation  He is disappointed that his exercise tolerance, running/cycling  is not what it was before ablation I reassured pt that he is still in the healing period and to be patient and restart his exercise at a much lower pace and slowly build back up    2. Chadsvasc score of 1 Continue xarelto 20 mg daily   Reminded not to interrupt anticoagulation  F/u with Dr. Rayann Heman as scheduled 11/6  Geroge Baseman. Ronnita Paz, [facility] [address] [place], [place] [address] [phone]

## 2018-07-06 ENCOUNTER — Ambulatory Visit: Payer: PPO | Admitting: Sports Medicine

## 2018-07-07 ENCOUNTER — Ambulatory Visit (HOSPITAL_COMMUNITY)
Admission: RE | Admit: 2018-07-07 | Discharge: 2018-07-07 | Disposition: A | Discharge status: Home / Self Care | Payer: PPO | Source: Ambulatory Visit | Attending: Internal Medicine | Admitting: Internal Medicine

## 2018-07-07 ENCOUNTER — Ambulatory Visit: Payer: PPO | Admitting: Internal Medicine

## 2018-07-07 ENCOUNTER — Ambulatory Visit (HOSPITAL_BASED_OUTPATIENT_CLINIC_OR_DEPARTMENT_OTHER): Payer: PPO

## 2018-07-07 ENCOUNTER — Other Ambulatory Visit: Payer: Self-pay

## 2018-07-07 ENCOUNTER — Other Ambulatory Visit: Payer: Self-pay | Admitting: Internal Medicine

## 2018-07-07 ENCOUNTER — Encounter: Payer: Self-pay | Admitting: Internal Medicine

## 2018-07-07 VITALS — BP 110/62 | HR 68 | Ht 67.0 in | Wt 145.4 lb

## 2018-07-07 DIAGNOSIS — I351 Nonrheumatic aortic (valve) insufficiency: Secondary | ICD-10-CM | POA: Diagnosis not present

## 2018-07-07 DIAGNOSIS — R0602 Shortness of breath: Secondary | ICD-10-CM | POA: Diagnosis not present

## 2018-07-07 DIAGNOSIS — R079 Chest pain, unspecified: Secondary | ICD-10-CM | POA: Diagnosis not present

## 2018-07-07 DIAGNOSIS — Z79899 Other long term (current) drug therapy: Secondary | ICD-10-CM | POA: Diagnosis not present

## 2018-07-07 DIAGNOSIS — Z8679 Personal history of other diseases of the circulatory system: Secondary | ICD-10-CM | POA: Diagnosis not present

## 2018-07-07 DIAGNOSIS — I480 Paroxysmal atrial fibrillation: Secondary | ICD-10-CM

## 2018-07-07 DIAGNOSIS — Z9889 Other specified postprocedural states: Secondary | ICD-10-CM | POA: Insufficient documentation

## 2018-07-07 LAB — ECHOCARDIOGRAM LIMITED
Height: 67 in
Weight: 2326.4 oz

## 2018-07-07 NOTE — Patient Instructions (Addendum)
Medication Instructions:  Your physician recommends that you continue on your current medications as directed. Please refer to the Current Medication list given to you today.  * If you need a refill on your cardiac medications before your next appointment, please call your pharmacy.   Labwork: Today: CBC w/ diff, BMET *We will only notify you of abnormal results, otherwise continue current treatment plan.  Testing/Procedures: A chest x-ray takes a picture of the organs and structures inside the chest, including the heart, lungs, and blood vessels. This test can show several things, including, whether the heart is enlarges; whether fluid is building up in the lungs; and whether pacemaker / defibrillator leads are still in place.  Your physician has requested that you have a limited echocardiogram. Echocardiography is a painless test that uses sound waves to create images of your heart. It provides your doctor with information about the size and shape of your heart and how well your heart's chambers and valves are working. This procedure takes approximately one hour. There are no restrictions for this procedure.  Follow-Up: You are scheduled to follow-up appointment in: 1 week with Dr. Rayann Heman --  On 07/15/2018 @ 10:15 a.m   Thank you for choosing [facility]!!    Any Other Special Instructions Will Be Listed Below (If Applicable).

## 2018-07-07 NOTE — Progress Notes (Signed)
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  He continues to have SOB post ablation.  His exercise tolerance is markedly reduce.  He has chest "pressure" with exertion and at times when lying supine.  He denies chest pain.  He also finds that his heart rate increases quickly with activity.  Did not improve with trial of lasix. + dry cough Denies fevers, chills, hemoptysis, orthopnea, PND, edema, presyncope syncope or other symptoms.    Past Medical History:  Diagnosis Date  . Benign prostatic hyperplasia without urinary obstruction 06/17/2014  . BPPV (benign paroxysmal positional vertigo) 07/01/2011  . Chronic hyperkalemia 05/20/2012   Overview:  Hx of for years  . Concussion syndrome 05/15/2011   This seems like a moderately severe concussion with at least 1 minute loss of consciousness in 15-20 minutes of significant confusion including being unable to state his name   . DEGENERATIVE JOINT DISEASE, HANDS 10/16/2009   Qualifier: Diagnosis of  By: [name], [name]    . Diverticulosis 06/20/2017  . Hemorrhoids, external 06/20/2017  . Hip pain, left 02/23/2014   Today this primarily appears like a gluteus medius syndrome with a secondary greater trochanteric bursitis   . Inguinal pain, right 08/04/2008   Qualifier: Diagnosis of  By: Hassell Done MD, Mary   US shows hypoechoic change but findings are not clearly diagnostic  Clinically seems like sports hernia  . Left ankle sprain 05/19/2012   This is associated with small avulsion fleck from talus  Note that his RT ankle did have surgery for old ankle fracutre   . Lumbar spondylosis 06/20/2017   Fairly significant L4-L5 facet arthropathy with effusion.  Can consider facet injections and potentially RFA down the road.  Marland Kitchen NEUROPATHY 03/01/2008   Qualifier: Diagnosis of  By: [name], [name]    . Paroxysmal atrial fibrillation (HCC)   . Right inguinal hernia  06/17/2014  . Strain of gluteus medius 01/29/2017  . Vertigo    Past Surgical History:  Procedure Laterality Date  . ANKLE ARTHROSCOPY    . ATRIAL FIBRILLATION ABLATION N/A 05/25/2018   Procedure: ATRIAL FIBRILLATION ABLATION;  Surgeon: Thompson Grayer, MD;  Location: [facility];  Service: Cardiovascular;  Laterality: N/A;  . KNEE ARTHROSCOPY      ROS- all systems are reviewed and negatives except as per HPI above  Current Outpatient Medications  Medication Sig Dispense Refill  . diazepam (VALIUM) 5 MG tablet Take 1 tablet (5 mg total) by mouth daily as needed for anxiety. 30 tablet 1  . furosemide (LASIX) 40 MG tablet Take one tablet by mouth daily x 3 days 3 tablet 0  . Multiple Vitamins-Minerals (MULTIVITAMIN WITH MINERALS) tablet Take 1 tablet by mouth daily.     . pantoprazole (PROTONIX) 40 MG tablet TAKE 1 TABLET BY MOUTH EVERY DAY 90 tablet 3  . rivaroxaban (XARELTO) 20 MG TABS tablet Take 1 tablet (20 mg total) by mouth daily with supper. 30 tablet 11  . traMADol (ULTRAM) 50 MG tablet Take 50 mg by mouth every 6 (six) hours as needed for moderate pain.     . Vitamin D, Ergocalciferol, 2000 units CAPS Take 2,000 Units by mouth daily.      No current facility-administered medications for this visit.     Physical Exam: Vitals:   07/07/18 1336  BP: 110/62  Pulse: 68  SpO2: 99%  Weight: 145 lb  6.4 oz (66 kg)  Height: 5\' 7"  (1.702 m)    GEN- The patient is well appearing, alert and oriented x 3 today.   Head- normocephalic, atraumatic Eyes-  Sclera clear, conjunctiva pink Ears- hearing intact Oropharynx- clear Lungs- scattered abnormal BS RUL otherwise clear, normal work of breathing, normal diaphragmatic excursion Heart- Regular rate and rhythm, no murmurs, rubs or gallops, PMI not laterally displaced GI- soft, NT, ND, + BS Extremities- no clubbing, cyanosis, or edema  Wt Readings from Last 3 Encounters:  07/07/18 145 lb 6.4 oz (66 kg)  06/21/18 147 lb (66.7 kg)    05/25/18 145 lb (65.8 kg)    EKG tracing ordered today is personally reviewed and shows sinus rhythm 68 bpm, PR 146 msec, QRS 92 msec, Qtc 393, early repolarization  Assessment and Plan:  1. Exertional SOB/ chest tightness post ablation Unclear etiology Given abnormal findings in RUL on exam, will order CXR Not volume overloaded today Echo to evaluate for effusion/ WMA If symptoms persist, will consider further workup such as cath or chest CT depending on above.  Return to see me in 1 week  Thompson Grayer MD, [facility] 07/07/2018 2:52 PM

## 2018-07-08 LAB — BASIC METABOLIC PANEL
BUN/Creatinine Ratio: 23 (ref 10–24)
BUN: 20 mg/dL (ref 8–27)
CO2: 25 mmol/L (ref 20–29)
Calcium: 10.1 mg/dL (ref 8.6–10.2)
Chloride: 102 mmol/L (ref 96–106)
Creatinine, Ser: 0.88 mg/dL (ref 0.76–1.27)
GFR, EST AFRICAN AMERICAN: 103 mL/min/{1.73_m2} (ref >59)
GFR, EST NON AFRICAN AMERICAN: 89 mL/min/{1.73_m2} (ref >59)
Glucose: 74 mg/dL (ref 65–99)
POTASSIUM: 4.5 mmol/L (ref 3.5–5.2)
SODIUM: 142 mmol/L (ref 134–144)

## 2018-07-08 LAB — CBC WITH DIFFERENTIAL/PLATELET
Basophils Absolute: 0 10*3/uL (ref 0.0–0.2)
Basos: 0 %
EOS (ABSOLUTE): 0 10*3/uL (ref 0.0–0.4)
Eos: 0 %
Hematocrit: 43.2 % (ref 37.5–51.0)
Hemoglobin: 14.8 g/dL (ref 13.0–17.7)
Immature Grans (Abs): 0 10*3/uL (ref 0.0–0.1)
Immature Granulocytes: 0 %
LYMPHS ABS: 1.9 10*3/uL (ref 0.7–3.1)
Lymphs: 20 %
MCH: 30.5 pg (ref 26.6–33.0)
MCHC: 34.3 g/dL (ref 31.5–35.7)
MCV: 89 fL (ref 79–97)
MONOS ABS: 0.7 10*3/uL (ref 0.1–0.9)
Monocytes: 8 %
Neutrophils Absolute: 6.8 10*3/uL (ref 1.4–7.0)
Neutrophils: 72 %
Platelets: 235 10*3/uL (ref 150–450)
RBC: 4.86 x10E6/uL (ref 4.14–5.80)
RDW: 13.6 % (ref 12.3–15.4)
WBC: 9.5 10*3/uL (ref 3.4–10.8)

## 2018-07-15 ENCOUNTER — Ambulatory Visit: Payer: PPO | Admitting: Internal Medicine

## 2018-07-15 VITALS — BP 102/99 | HR 61 | Ht 67.0 in | Wt 145.4 lb

## 2018-07-15 DIAGNOSIS — R079 Chest pain, unspecified: Secondary | ICD-10-CM | POA: Diagnosis not present

## 2018-07-15 DIAGNOSIS — I480 Paroxysmal atrial fibrillation: Secondary | ICD-10-CM

## 2018-07-15 DIAGNOSIS — R0602 Shortness of breath: Secondary | ICD-10-CM | POA: Diagnosis not present

## 2018-07-15 NOTE — Patient Instructions (Addendum)
Medication Instructions:  Your physician recommends that you continue on your current medications as directed. Please refer to the Current Medication list given to you today.  Labwork: None ordered.  Testing/Procedures: Your physician has requested that you have cardiac CT. Cardiac computed tomography (CT) is a painless test that uses an x-ray machine to take clear, detailed pictures of your heart. For further information please visit [facility]. Please follow instruction sheet as given.  Schedule for cardiac CT with FFR   Follow-Up: Your physician wants you to follow-up in: 3 weeks with Dr. Rayann Heman.      Any Other Special Instructions Will Be Listed Below (If Applicable).  If you need a refill on your cardiac medications before your next appointment, please call your pharmacy.    CARDIAC CT INSTRUCTIONS:  Please arrive at the [facility] main entrance of [facility] [place], [place] [address] [phone]  Proceed to the [facility] Radiology Department (First Floor).  Please follow these instructions carefully (unless otherwise directed):  On the Night Before the Test:  Drink plenty of water.  Do not consume any caffeinated/decaffeinated beverages or chocolate 12 hours prior to your test.  Do not take any antihistamines 12 hours prior to your test.  On the Day of the Test:  Drink plenty of water. Do not drink any water within one hour of the test.  Do not eat any food 4 hours prior to the test.  You may take your regular medications prior to the test. IF NOT ON A BETA BLOCKER - Take 50 mg of lopressor (metoprolol) one hour before the test.    After the Test:  Drink plenty of water.  After receiving IV contrast, you may experience a mild flushed feeling. This is normal.  On occasion, you may experience a mild rash up to 24 hours after the test. This is not dangerous. If this occurs, you can take  Benadryl 25 mg and increase your fluid intake.  If you experience trouble breathing, this can be serious. If it is severe call 911 IMMEDIATELY. If it is mild, please call our office.

## 2018-07-15 NOTE — Progress Notes (Signed)
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  Since last being seen in our clinic, the patient reports doing reasonably well.  He has been able to ride his bike 50 miles this past weekend.  He does continue to have SOB at home, especially on incline and feels that his exercise tolerance remains low.  + chest tightness with exertion though improved.  Today, he denies symptoms of palpitations,  lower extremity edema, dizziness, presyncope, or syncope.  The patient is otherwise without complaint today.   Past Medical History:  Diagnosis Date  . Benign prostatic hyperplasia without urinary obstruction 06/17/2014  . BPPV (benign paroxysmal positional vertigo) 07/01/2011  . Chronic hyperkalemia 05/20/2012   Overview:  Hx of for years  . Concussion syndrome 05/15/2011   This seems like a moderately severe concussion with at least 1 minute loss of consciousness in 15-20 minutes of significant confusion including being unable to state his name   . DEGENERATIVE JOINT DISEASE, HANDS 10/16/2009   Qualifier: Diagnosis of  By: [name], [name]    . Diverticulosis 06/20/2017  . Hemorrhoids, external 06/20/2017  . Hip pain, left 02/23/2014   Today this primarily appears like a gluteus medius syndrome with a secondary greater trochanteric bursitis   . Inguinal pain, right 08/04/2008   Qualifier: Diagnosis of  By: Hassell Done MD, Mary   US shows hypoechoic change but findings are not clearly diagnostic  Clinically seems like sports hernia  . Left ankle sprain 05/19/2012   This is associated with small avulsion fleck from talus  Note that his RT ankle did have surgery for old ankle fracutre   . Lumbar spondylosis 06/20/2017   Fairly significant L4-L5 facet arthropathy with effusion.  Can consider facet injections and potentially RFA down the road.  Marland Kitchen NEUROPATHY 03/01/2008   Qualifier: Diagnosis of  By: [name], [name]    .  Paroxysmal atrial fibrillation (HCC)   . Right inguinal hernia 06/17/2014  . Strain of gluteus medius 01/29/2017  . Vertigo    Past Surgical History:  Procedure Laterality Date  . ANKLE ARTHROSCOPY    . ATRIAL FIBRILLATION ABLATION N/A 05/25/2018   Procedure: ATRIAL FIBRILLATION ABLATION;  Surgeon: Thompson Grayer, MD;  Location: [facility];  Service: Cardiovascular;  Laterality: N/A;  . KNEE ARTHROSCOPY      ROS- all systems are reviewed and negatives except as per HPI above  Current Outpatient Medications  Medication Sig Dispense Refill  . diazepam (VALIUM) 5 MG tablet Take 1 tablet (5 mg total) by mouth daily as needed for anxiety. 30 tablet 1  . furosemide (LASIX) 40 MG tablet Take one tablet by mouth daily x 3 days 3 tablet 0  . Multiple Vitamins-Minerals (MULTIVITAMIN WITH MINERALS) tablet Take 1 tablet by mouth daily.     . pantoprazole (PROTONIX) 40 MG tablet TAKE 1 TABLET BY MOUTH EVERY DAY 90 tablet 3  . rivaroxaban (XARELTO) 20 MG TABS tablet Take 1 tablet (20 mg total) by mouth daily with supper. 30 tablet 11  . traMADol (ULTRAM) 50 MG tablet Take 50 mg by mouth every 6 (six) hours as needed for moderate pain.     . Vitamin D, Ergocalciferol, 2000 units CAPS Take 2,000 Units by mouth daily.      No current facility-administered medications for this visit.     Physical Exam: Vitals:   07/15/18 1054  BP: Marland Kitchen)  102/99  Pulse: 61  SpO2: 98%  Weight: 145 lb 6.4 oz (66 kg)  Height: 5\' 7"  (1.702 m)    GEN- The patient is well appearing, alert and oriented x 3 today.   Head- normocephalic, atraumatic Eyes-  Sclera clear, conjunctiva pink Ears- hearing intact Oropharynx- clear Lungs- Clear to ausculation bilaterally, normal work of breathing Heart- Regular rate and rhythm, no murmurs, rubs or gallops, PMI not laterally displaced GI- soft, NT, ND, + BS Extremities- no clubbing, cyanosis, or edema  Wt Readings from Last 3 Encounters:  07/15/18 145 lb 6.4 oz (66 kg)    07/07/18 145 lb 6.4 oz (66 kg)  06/21/18 147 lb (66.7 kg)    Assessment and Plan:  1. Exertional SOb/ chest tightness Echo, cxr unremarkable. He is 6 weeks post ablation Will obtain cardiac CT with RRF to exclude CAD and PV stenosis as the cause  Return in 3 weeks  Thompson Grayer MD, [facility] 07/15/2018 2:18 PM

## 2018-07-19 ENCOUNTER — Ambulatory Visit (HOSPITAL_COMMUNITY)
Admission: RE | Admit: 2018-07-19 | Discharge: 2018-07-19 | Disposition: A | Discharge status: Home / Self Care | Payer: PPO | Source: Ambulatory Visit | Attending: Internal Medicine | Admitting: Internal Medicine

## 2018-07-19 ENCOUNTER — Ambulatory Visit (HOSPITAL_COMMUNITY): Payer: PPO

## 2018-07-19 DIAGNOSIS — I480 Paroxysmal atrial fibrillation: Secondary | ICD-10-CM | POA: Diagnosis not present

## 2018-07-19 DIAGNOSIS — R079 Chest pain, unspecified: Secondary | ICD-10-CM | POA: Insufficient documentation

## 2018-07-19 DIAGNOSIS — R0602 Shortness of breath: Secondary | ICD-10-CM | POA: Insufficient documentation

## 2018-07-19 DIAGNOSIS — I2510 Atherosclerotic heart disease of native coronary artery without angina pectoris: Secondary | ICD-10-CM | POA: Insufficient documentation

## 2018-07-19 DIAGNOSIS — I700 Atherosclerosis of aorta: Secondary | ICD-10-CM | POA: Insufficient documentation

## 2018-07-19 MED ORDER — NITROGLYCERIN 0.4 MG SL SUBL
SUBLINGUAL_TABLET | SUBLINGUAL | Status: AC
  Filled 2018-07-19: qty 2

## 2018-07-19 MED ORDER — IOPAMIDOL (ISOVUE-370) INJECTION 76%
INTRAVENOUS | Status: AC
  Administered 2018-07-19: 100 mL
  Filled 2018-07-19: qty 100

## 2018-07-19 MED ORDER — NITROGLYCERIN 0.4 MG SL SUBL
0.8000 mg | SUBLINGUAL_TABLET | Freq: Once | SUBLINGUAL | Status: AC
  Administered 2018-07-19: 0.8 mg via SUBLINGUAL
  Filled 2018-07-19: qty 25

## 2018-08-05 ENCOUNTER — Ambulatory Visit (INDEPENDENT_AMBULATORY_CARE_PROVIDER_SITE_OTHER): Payer: PPO | Admitting: Internal Medicine

## 2018-08-05 VITALS — Ht 67.0 in

## 2018-08-05 DIAGNOSIS — I480 Paroxysmal atrial fibrillation: Secondary | ICD-10-CM

## 2018-08-05 DIAGNOSIS — R0602 Shortness of breath: Secondary | ICD-10-CM

## 2018-08-05 NOTE — Patient Instructions (Addendum)
Medication Instructions:  Your physician recommends that you continue on your current medications as directed. Please refer to the Current Medication list given to you today.  Labwork: None ordered.  Testing/Procedures: Your physician has recommended that you have a cardiopulmonary stress test (CPX). CPX testing is a non-invasive measurement of heart and lung function. It replaces a traditional treadmill stress test. This type of test provides a tremendous amount of information that relates not only to your present condition but also for future outcomes. This test combines measurements of you ventilation, respiratory gas exchange in the lungs, electrocardiogram (EKG), blood pressure and physical response before, during, and following an exercise protocol.  Please schedule for a CPX ASAP  Follow-Up: Your physician wants you to follow-up as scheduled with Dr. Rayann Heman.  Any Other Special Instructions Will Be Listed Below (If Applicable).  If you need a refill on your cardiac medications before your next appointment, please call your pharmacy.

## 2018-08-08 NOTE — Progress Notes (Signed)
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  He is accompanied by his wife.  He is very anxious.  He has reviewed recent CT scan results and is worried about pulmonic vein stenosis.  He does not have SOB at rest.  Denies CP.  He feels that with heavy exertion/ running that his heart rate is higher than prior to ablation.  He also continues to feel that his maximal exercise effort is limited.  He is otherwise very active.  He continues to work without issues.  He has had no symptoms of afib post ablation.  Today, he denies symptoms of palpitations, chest pain, lower extremity edema, dizziness, presyncope, or syncope.  The patient is otherwise without complaint today.   Past Medical History:  Diagnosis Date  . Benign prostatic hyperplasia without urinary obstruction 06/17/2014  . BPPV (benign paroxysmal positional vertigo) 07/01/2011  . Chronic hyperkalemia 05/20/2012   Overview:  Hx of for years  . Concussion syndrome 05/15/2011   This seems like a moderately severe concussion with at least 1 minute loss of consciousness in 15-20 minutes of significant confusion including being unable to state his name   . DEGENERATIVE JOINT DISEASE, HANDS 10/16/2009   Qualifier: Diagnosis of  By: [name], [name]    . Diverticulosis 06/20/2017  . Hemorrhoids, external 06/20/2017  . Hip pain, left 02/23/2014   Today this primarily appears like a gluteus medius syndrome with a secondary greater trochanteric bursitis   . Inguinal pain, right 08/04/2008   Qualifier: Diagnosis of  By: Hassell Done MD, Mary   US shows hypoechoic change but findings are not clearly diagnostic  Clinically seems like sports hernia  . Left ankle sprain 05/19/2012   This is associated with small avulsion fleck from talus  Note that his RT ankle did have surgery for old ankle fracutre   . Lumbar spondylosis 06/20/2017   Fairly significant L4-L5 facet  arthropathy with effusion.  Can consider facet injections and potentially RFA down the road.  Marland Kitchen NEUROPATHY 03/01/2008   Qualifier: Diagnosis of  By: [name], [name]    . Paroxysmal atrial fibrillation (HCC)   . Right inguinal hernia 06/17/2014  . Strain of gluteus medius 01/29/2017  . Vertigo    Past Surgical History:  Procedure Laterality Date  . ANKLE ARTHROSCOPY    . ATRIAL FIBRILLATION ABLATION N/A 05/25/2018   Procedure: ATRIAL FIBRILLATION ABLATION;  Surgeon: Thompson Grayer, MD;  Location: [facility];  Service: Cardiovascular;  Laterality: N/A;  . KNEE ARTHROSCOPY      ROS- all systems are reviewed and negatives except as per HPI above  Current Outpatient Medications  Medication Sig Dispense Refill  . diazepam (VALIUM) 5 MG tablet Take 1 tablet (5 mg total) by mouth daily as needed for anxiety. 30 tablet 1  . Multiple Vitamins-Minerals (MULTIVITAMIN WITH MINERALS) tablet Take 1 tablet by mouth daily.     . pantoprazole (PROTONIX) 40 MG tablet TAKE 1 TABLET BY MOUTH EVERY DAY 90 tablet 3  . rivaroxaban (XARELTO) 20 MG TABS tablet Take 1 tablet (20 mg total) by mouth daily with supper. 30 tablet 11  . traMADol (ULTRAM) 50 MG tablet Take 50 mg by mouth every 6 (six) hours as needed for moderate pain.     . Vitamin D, Ergocalciferol, 2000 units CAPS Take 2,000 Units by mouth daily.      No current facility-administered  medications for this visit.     Physical Exam: Vitals:   08/05/18 1631  Height: 5\' 7"  (1.702 m)    GEN- The patient is well appearing, alert and oriented x 3 today.   Head- normocephalic, atraumatic Eyes-  Sclera clear, conjunctiva pink Ears- hearing intact Oropharynx- clear Lungs- Clear to ausculation bilaterally, normal work of breathing Heart- Regular rate and rhythm, no murmurs, rubs or gallops, PMI not laterally displaced GI- soft, NT, ND, + BS Extremities- no clubbing, cyanosis, or edema  Wt Readings from Last 3 Encounters:  07/15/18 145 lb 6.4  oz (66 kg)  07/07/18 145 lb 6.4 oz (66 kg)  06/21/18 147 lb (66.7 kg)      Assessment and Plan:  1. Paroxysmal atrial fibrillation No recurrence post ablation I have reviewed his Jodelle Red which he brings with him today.  He has recorded episodes of sinus with PACs however I do not see any afib.  2. SOB with moderate activity I will order CPX to further evaluate his exercise limitation.  I am optimistic that his symptoms will continue to improve. I have reviewed his cardiac CT with both Drs [name] and Aundra Dubin.  There is concern for the possibility of PV stenosis within the LLPV.  All other PVs are felt to be normal per their review. I have also discussed at length by phone with Dr Lamona Curl today.  He agrees with me that as the CT was obtained within 2 months post ablation that there is possibility of LA edema/ spasm and that we should sit tight for now.  He agrees with me that the most appropriate next step is to repeat the CT in 2-3 months.  I have discussed this at length with the patient and his wife today.  [name] does not feel that a more expedited workup is necessary.  I discussed this with the patient today.  I have reassured him that we will continue to follow him closely and make referrals should this be required.  I do not however want to cause him harm by premature worry or alarm.  Return for follow-up in 4 weeks, post metabolic stress test.  Thompson Grayer MD, [facility]

## 2018-08-09 ENCOUNTER — Ambulatory Visit (HOSPITAL_COMMUNITY): Payer: PPO | Attending: Internal Medicine

## 2018-08-09 DIAGNOSIS — R0602 Shortness of breath: Secondary | ICD-10-CM | POA: Diagnosis not present

## 2018-08-23 ENCOUNTER — Ambulatory Visit (INDEPENDENT_AMBULATORY_CARE_PROVIDER_SITE_OTHER): Payer: PPO | Admitting: Family Medicine

## 2018-08-23 ENCOUNTER — Encounter: Payer: Self-pay | Admitting: Family Medicine

## 2018-08-23 VITALS — BP 118/74 | HR 64 | Temp 97.9°F | Ht 67.0 in | Wt 147.8 lb

## 2018-08-23 DIAGNOSIS — Z1159 Encounter for screening for other viral diseases: Secondary | ICD-10-CM | POA: Diagnosis not present

## 2018-08-23 DIAGNOSIS — H9319 Tinnitus, unspecified ear: Secondary | ICD-10-CM | POA: Diagnosis not present

## 2018-08-23 DIAGNOSIS — M542 Cervicalgia: Secondary | ICD-10-CM | POA: Diagnosis not present

## 2018-08-23 DIAGNOSIS — Z125 Encounter for screening for malignant neoplasm of prostate: Secondary | ICD-10-CM

## 2018-08-23 DIAGNOSIS — Z13220 Encounter for screening for lipoid disorders: Secondary | ICD-10-CM | POA: Diagnosis not present

## 2018-08-23 DIAGNOSIS — F411 Generalized anxiety disorder: Secondary | ICD-10-CM | POA: Diagnosis not present

## 2018-08-23 DIAGNOSIS — G8929 Other chronic pain: Secondary | ICD-10-CM | POA: Diagnosis not present

## 2018-08-23 DIAGNOSIS — D179 Benign lipomatous neoplasm, unspecified: Secondary | ICD-10-CM | POA: Diagnosis not present

## 2018-08-23 DIAGNOSIS — Z0001 Encounter for general adult medical examination with abnormal findings: Secondary | ICD-10-CM

## 2018-08-23 DIAGNOSIS — R1031 Right lower quadrant pain: Secondary | ICD-10-CM | POA: Diagnosis not present

## 2018-08-23 DIAGNOSIS — Z23 Encounter for immunization: Secondary | ICD-10-CM | POA: Diagnosis not present

## 2018-08-23 LAB — LIPID PANEL
CHOL/HDL RATIO: 3
Cholesterol: 165 mg/dL (ref 0–200)
HDL: 57.6 mg/dL (ref >39.00)
LDL Cholesterol: 96 mg/dL (ref 0–99)
NONHDL: 106.92
TRIGLYCERIDES: 57 mg/dL (ref 0.0–149.0)
VLDL: 11.4 mg/dL (ref 0.0–40.0)

## 2018-08-23 LAB — PSA: PSA: 1.51 ng/mL (ref 0.10–4.00)

## 2018-08-23 MED ORDER — TRAMADOL HCL 50 MG PO TABS: 50.0000 mg | ORAL_TABLET | Freq: Four times a day (QID) | ORAL | 1 refills | Status: DC | PRN

## 2018-08-23 MED ORDER — DIAZEPAM 5 MG PO TABS: 5.0000 mg | ORAL_TABLET | Freq: Every day | ORAL | 1 refills | Status: DC | PRN

## 2018-08-23 NOTE — Assessment & Plan Note (Signed)
No red flag signs or symptoms.  Refilled Valium today.

## 2018-08-23 NOTE — Progress Notes (Signed)
Subjective:  William Underwood is a 67 y.o. male who presents today for his annual comprehensive physical exam.    HPI:  He has no acute complaints today.   His chronic, stable conditions are outlined below 1.  Tinnitus.  Present for a few years related to postconcussive syndrome.  Occasionally takes Valium for this which helps. 2.  Chronic neck pain.  Several year history.  Patient takes tramadol as needed.  He has a few minor complaints outlined below 1.  Right groin pain.  Present for the past 5 years.  He is concerned that may be a hernia.  Worse with movement and with standing up. 2.  Skin lumps.  Several year history.  Located on left buttocks and left arm.  Stable in size of the last several years.  Lifestyle Diet: Balanced. Exercise: Was previously running and cycling, however has been more limited lately due to his recent ablation and possible pulmonary vein stenosis.   Depression screen PHQ 2/9 08/23/2018  Decreased Interest 0  Down, Depressed, Hopeless 0  PHQ - 2 Score 0    Health Maintenance Due  Topic Date Due  . Hepatitis C Screening  1951/08/15     ROS: Per HPI, otherwise a complete review of systems was negative.   PMH:  The following were reviewed and entered/updated in epic: Past Medical History:  Diagnosis Date  . Benign prostatic hyperplasia without urinary obstruction 06/17/2014  . BPPV (benign paroxysmal positional vertigo) 07/01/2011  . Chronic hyperkalemia 05/20/2012   Overview:  Hx of for years  . Concussion syndrome 05/15/2011   This seems like a moderately severe concussion with at least 1 minute loss of consciousness in 15-20 minutes of significant confusion including being unable to state his name   . DEGENERATIVE JOINT DISEASE, HANDS 10/16/2009   Qualifier: Diagnosis of  By: [name], [name]    . Diverticulosis 06/20/2017  . Hemorrhoids, external 06/20/2017  . Hip pain, left 02/23/2014   Today this primarily appears like a gluteus medius syndrome  with a secondary greater trochanteric bursitis   . Inguinal pain, right 08/04/2008   Qualifier: Diagnosis of  By: Hassell Done MD, Mary   US shows hypoechoic change but findings are not clearly diagnostic  Clinically seems like sports hernia  . Left ankle sprain 05/19/2012   This is associated with small avulsion fleck from talus  Note that his RT ankle did have surgery for old ankle fracutre   . Lumbar spondylosis 06/20/2017   Fairly significant L4-L5 facet arthropathy with effusion.  Can consider facet injections and potentially RFA down the road.  Marland Kitchen NEUROPATHY 03/01/2008   Qualifier: Diagnosis of  By: [name], [name]    . Paroxysmal atrial fibrillation (HCC)   . Right inguinal hernia 06/17/2014  . Strain of gluteus medius 01/29/2017  . Vertigo    Patient Active Problem List   Diagnosis Date Noted  . Chronic neck pain 08/23/2018  . Tinnitus 08/23/2018  . Lipoma 08/23/2018  . Paroxysmal atrial fibrillation ([place]) 05/25/2018  . Left shoulder pain 07/14/2017  . Lumbar spondylosis 06/20/2017  . Diverticulosis 06/20/2017  . Hemorrhoids, external 06/20/2017  . Benign prostatic hyperplasia without urinary obstruction 06/17/2014  . Sacroiliac joint dysfunction of left side 04/05/2014  . Hip pain, left 02/23/2014  . Chronic hyperkalemia 05/20/2012  . Prostatitis 05/20/2012  . Ankle pain 05/19/2012  . Left ankle sprain 05/19/2012  . BPPV (benign paroxysmal positional vertigo) 07/01/2011  . Concussion syndrome 05/15/2011  . DEGENERATIVE JOINT DISEASE,  HANDS 10/16/2009  . NEUROPATHY 03/01/2008  . SYMPTOM, PAIN, ABDOMINAL, RIGHT UP QUADRANT 12/31/2006  . INJURY NOS, HIP/THIGH 12/31/2006   Past Surgical History:  Procedure Laterality Date  . ANKLE ARTHROSCOPY    . ATRIAL FIBRILLATION ABLATION N/A 05/25/2018   Procedure: ATRIAL FIBRILLATION ABLATION;  Surgeon: Thompson Grayer, MD;  Location: [facility];  Service: Cardiovascular;  Laterality: N/A;  . KNEE ARTHROSCOPY      Family History    Problem Relation Age of Onset  . Hearing loss Father   . Colon cancer Neg Hx     Medications- reviewed and updated Current Outpatient Medications  Medication Sig Dispense Refill  . diazepam (VALIUM) 5 MG tablet Take 1 tablet (5 mg total) by mouth daily as needed for anxiety. 30 tablet 1  . Multiple Vitamins-Minerals (MULTIVITAMIN WITH MINERALS) tablet Take 1 tablet by mouth daily.     . pantoprazole (PROTONIX) 40 MG tablet TAKE 1 TABLET BY MOUTH EVERY DAY 90 tablet 3  . rivaroxaban (XARELTO) 20 MG TABS tablet Take 1 tablet (20 mg total) by mouth daily with supper. 30 tablet 11  . traMADol (ULTRAM) 50 MG tablet Take 1 tablet (50 mg total) by mouth every 6 (six) hours as needed for moderate pain. 30 tablet 1  . Vitamin D, Ergocalciferol, 2000 units CAPS Take 2,000 Units by mouth daily.      No current facility-administered medications for this visit.     Allergies-reviewed and updated Allergies  Allergen Reactions  . Codeine Other (See Comments)    Sensitive, agitation  . Cefdinir Palpitations    Social History   Socioeconomic History  . Marital status: Married    Spouse name: Not on file  . Number of children: Not on file  . Years of education: Not on file  . Highest education level: Not on file  Occupational History  . Not on file  Social Needs  . Financial resource strain: Not on file  . Food insecurity:    Worry: Not on file    Inability: Not on file  . Transportation needs:    Medical: Not on file    Non-medical: Not on file  Tobacco Use  . Smoking status: Never Smoker  . Smokeless tobacco: Never Used  Substance and Sexual Activity  . Alcohol use: No  . Drug use: No  . Sexual activity: Not on file  Lifestyle  . Physical activity:    Days per week: Not on file    Minutes per session: Not on file  . Stress: Not on file  Relationships  . Social connections:    Talks on phone: Not on file    Gets together: Not on file    Attends religious service: Not on  file    Active member of club or organization: Not on file    Attends meetings of clubs or organizations: Not on file    Relationship status: Not on file  Other Topics Concern  . Not on file  Social History Narrative  . Not on file    Objective:  Physical Exam: BP 118/74 (BP Location: Left Arm, Patient Position: Sitting, Cuff Size: Normal)   Pulse 64   Temp 97.9 F (36.6 C) (Oral)   Ht 5\' 7"  (1.702 m)   Wt 147 lb 12.8 oz (67 kg)   SpO2 98%   BMI 23.15 kg/m   Body mass index is 23.15 kg/m. Wt Readings from Last 3 Encounters:  08/23/18 147 lb 12.8 oz (67 kg)  07/15/18 145 lb 6.4 oz (66 kg)  07/07/18 145 lb 6.4 oz (66 kg)  Gen: NAD, resting comfortably HEENT: TMs normal bilaterally. OP clear. No thyromegaly noted.  CV: RRR with no murmurs appreciated Pulm: NWOB, CTAB with no crackles, wheezes, or rhonchi GI: Normal bowel sounds present. Soft, Nontender, Nondistended. MSK: no edema, cyanosis, or clubbing noted Skin: warm, dry.  3 cm mobile lesion on left buttocks and 1 cm lesion in left axilla. Neuro: CN2-12 grossly intact. Strength 5/5 in upper and lower extremities. Reflexes symmetric and intact bilaterally.  Psych: Normal affect and thought content  Assessment/Plan:  Chronic neck pain No red flag signs or symptoms.  Refill tramadol today.  Tinnitus No red flag signs or symptoms.  Refilled Valium today.  Lipoma Skin lesions consistent with lipomas.  No red flag signs or symptoms.  Continue with watchful waiting.  Right groin pain Check ultrasound to rule out hernia.  Preventative Healthcare: Check lipid panel, PSA, and hepatitis C antibody.  Patient Counseling(The following topics were reviewed and/or handout was given):  -Nutrition: Stressed importance of moderation in sodium/caffeine intake, saturated fat and cholesterol, caloric balance, sufficient intake of fresh fruits, vegetables, and fiber.  -Stressed the importance of regular exercise.   -Substance Abuse:  Discussed cessation/primary prevention of tobacco, alcohol, or other drug use; driving or other dangerous activities under the influence; availability of treatment for abuse.   -Injury prevention: Discussed safety belts, safety helmets, smoke detector, smoking near bedding or upholstery.   -Sexuality: Discussed sexually transmitted diseases, partner selection, use of condoms, avoidance of unintended pregnancy and contraceptive alternatives.   -Dental health: Discussed importance of regular tooth brushing, flossing, and dental visits.  -Health maintenance and immunizations reviewed. Please refer to Health maintenance section.  Return to care in 1 year for next preventative visit.   [name]. [name], MD 08/23/2018 12:38 PM

## 2018-08-23 NOTE — Assessment & Plan Note (Signed)
Skin lesions consistent with lipomas.  No red flag signs or symptoms.  Continue with watchful waiting.

## 2018-08-23 NOTE — Patient Instructions (Signed)
It was very nice to see you today!  I will refill your Valium and tramadol today.  We will check an ultrasound of your groin to make sure you do not have a hernia.  Please keep an eye on your spots let me know if it size or changes in any other way.  We will check blood work today.  We will get your last pneumonia shot.  Convexity in 1 year, or sooner as needed.  Take care, Dr [name]

## 2018-08-23 NOTE — Assessment & Plan Note (Signed)
No red flag signs or symptoms.  Refill tramadol today.

## 2018-08-24 LAB — HEPATITIS C ANTIBODY
HEP C AB: NONREACTIVE
SIGNAL TO CUT-OFF: 0.01 (ref <1.00)

## 2018-08-24 NOTE — Progress Notes (Signed)
Dr Marigene Ehlers interpretation of your lab work:  Your PSA level was normal Your cholesterol levels look great! Your hepatitis C test was negative.  We do not need to make any changes at this time. Keep up the good work and we can recheck again next year.    If you have any additional questions, please give [place] a call or send [place] a message through [place].  Take care, Dr [name]

## 2018-08-25 NOTE — Progress Notes (Deleted)
Subjective:   William Underwood is a 67 y.o. male who presents for an Initial Medicare Annual Wellness Visit.  Reports health as Last OV 08/23/18   Diet Chol/hdl 3 from 2;    Exercise  Tobacco use negative   There are no preventive care reminders to display for this patient.    PSA 07/2018 Colonoscopy 02/2016  Educated regarding shingrix       Objective:    There were no vitals filed for this visit. There is no height or weight on file to calculate BMI.  Advanced Directives 05/25/2018 10/30/2017 10/30/2016 02/27/2016 02/22/2015 02/22/2015 06/15/2014  Does Patient Have a Medical Advance Directive? Yes No No No No No No  Type of Advance Directive [place]  Does patient want to make changes to medical advance directive? No - Patient declined - - - - - -  Copy of [place] in Chart? No - copy requested - - - - - -  Would patient like information on creating a medical advance directive? - No - Patient declined - - No - patient declined information No - patient declined information No - patient declined information    Current Medications (verified) Outpatient Encounter Medications as of 08/26/2018  Medication Sig  . diazepam (VALIUM) 5 MG tablet Take 1 tablet (5 mg total) by mouth daily as needed for anxiety.  . Multiple Vitamins-Minerals (MULTIVITAMIN WITH MINERALS) tablet Take 1 tablet by mouth daily.   . pantoprazole (PROTONIX) 40 MG tablet TAKE 1 TABLET BY MOUTH EVERY DAY  . rivaroxaban (XARELTO) 20 MG TABS tablet Take 1 tablet (20 mg total) by mouth daily with supper.  . traMADol (ULTRAM) 50 MG tablet Take 1 tablet (50 mg total) by mouth every 6 (six) hours as needed for moderate pain.  . Vitamin D, Ergocalciferol, 2000 units CAPS Take 2,000 Units by mouth daily.    No facility-administered encounter medications on file as of 08/26/2018.     Allergies (verified) Codeine and Cefdinir   History: Past Medical History:    Diagnosis Date  . Benign prostatic hyperplasia without urinary obstruction 06/17/2014  . BPPV (benign paroxysmal positional vertigo) 07/01/2011  . Chronic hyperkalemia 05/20/2012   Overview:  Hx of for years  . Concussion syndrome 05/15/2011   This seems like a moderately severe concussion with at least 1 minute loss of consciousness in 15-20 minutes of significant confusion including being unable to state his name   . DEGENERATIVE JOINT DISEASE, HANDS 10/16/2009   Qualifier: Diagnosis of  By: [name], [name]    . Diverticulosis 06/20/2017  . Hemorrhoids, external 06/20/2017  . Hip pain, left 02/23/2014   Today this primarily appears like a gluteus medius syndrome with a secondary greater trochanteric bursitis   . Inguinal pain, right 08/04/2008   Qualifier: Diagnosis of  By: Hassell Done MD, Mary   US shows hypoechoic change but findings are not clearly diagnostic  Clinically seems like sports hernia  . Left ankle sprain 05/19/2012   This is associated with small avulsion fleck from talus  Note that his RT ankle did have surgery for old ankle fracutre   . Lumbar spondylosis 06/20/2017   Fairly significant L4-L5 facet arthropathy with effusion.  Can consider facet injections and potentially RFA down the road.  Marland Kitchen NEUROPATHY 03/01/2008   Qualifier: Diagnosis of  By: [name], [name]    . Paroxysmal atrial fibrillation (HCC)   . Right inguinal hernia 06/17/2014  .  Strain of gluteus medius 01/29/2017  . Vertigo    Past Surgical History:  Procedure Laterality Date  . ANKLE ARTHROSCOPY    . ATRIAL FIBRILLATION ABLATION N/A 05/25/2018   Procedure: ATRIAL FIBRILLATION ABLATION;  Surgeon: Thompson Grayer, MD;  Location: [facility];  Service: Cardiovascular;  Laterality: N/A;  . KNEE ARTHROSCOPY     Family History  Problem Relation Age of Onset  . Hearing loss Father   . Colon cancer Neg Hx    Social History   Socioeconomic History  . Marital status: Married    Spouse name: Not on file  . Number of  children: Not on file  . Years of education: Not on file  . Highest education level: Not on file  Occupational History  . Not on file  Social Needs  . Financial resource strain: Not on file  . Food insecurity:    Worry: Not on file    Inability: Not on file  . Transportation needs:    Medical: Not on file    Non-medical: Not on file  Tobacco Use  . Smoking status: Never Smoker  . Smokeless tobacco: Never Used  Substance and Sexual Activity  . Alcohol use: No  . Drug use: No  . Sexual activity: Not on file  Lifestyle  . Physical activity:    Days per week: Not on file    Minutes per session: Not on file  . Stress: Not on file  Relationships  . Social connections:    Talks on phone: Not on file    Gets together: Not on file    Attends religious service: Not on file    Active member of club or organization: Not on file    Attends meetings of clubs or organizations: Not on file    Relationship status: Not on file  Other Topics Concern  . Not on file  Social History Narrative  . Not on file   Tobacco Counseling Counseling given: Not Answered   Clinical Intake:    Activities of Daily Living In your present state of health, do you have any difficulty performing the following activities: 05/25/2018  Hearing? N  Vision? N  Difficulty concentrating or making decisions? N  Walking or climbing stairs? N  Dressing or bathing? N  Doing errands, shopping? N  Some recent data might be hidden     Immunizations and Health Maintenance Immunization History  Administered Date(s) Administered  . Influenza Whole 07/23/2018  . Influenza, High Dose Seasonal PF 07/13/2017, 07/23/2018  . Influenza, Seasonal, Injecte, Preservative Fre 08/01/2014  . Influenza,inj,Quad PF,6+ Mos 08/13/2015  . Pneumococcal Conjugate-13 08/23/2018  . Pneumococcal Polysaccharide-23 07/13/2016  . Pneumococcal-Unspecified 10/27/2011  . Zoster 10/27/2011   There are no preventive care reminders to  display for this patient.  Patient Care Team: [name], MD as PCP - General (Family Medicine)  Indicate any recent Medical Services you may have received from other than [facility] providers in the past year (date may be approximate).    Assessment:   This is a routine wellness examination for William Underwood.  Hearing/Vision screen No exam data present  Dietary issues and exercise activities discussed:    Goals   None    Depression Screen PHQ 2/9 Scores 08/23/2018 01/29/2017 10/30/2016 02/22/2015  PHQ - 2 Score 0 0 0 0  Exception Documentation - Other- indicate reason in comment box Other- indicate reason in comment box Other- indicate reason in comment box    Fall Risk Fall Risk  08/23/2018 01/29/2017 10/30/2016 02/22/2015  Falls in the past year? No No No No  Risk for fall due to : - Other (Comment) Other (Comment) Other (Comment)      Cognitive Function:    Ad8 score reviewed for issues:  Issues making decisions:  Less interest in hobbies / activities:  Repeats questions, stories (family complaining):  Trouble using ordinary gadgets (microwave, computer, phone):  Forgets the month or year:   Mismanaging finances:   Remembering appts:  Daily problems with thinking and/or memory: Ad8 score is=          Screening Tests Health Maintenance  Topic Date Due  . TETANUS/TDAP  08/24/2019 (Originally 10/24/1970)  . COLONOSCOPY  03/12/2026  . INFLUENZA VACCINE  Completed  . Hepatitis C Screening  Completed  . PNA vac Low Risk Adult  Completed         Plan:      PCP Notes ***  Health Maintenance ***  Abnormal Screens  ***  Referrals  ***  Patient concerns; ***  Nurse Concerns; ***  Next PCP apt ***      I have personally reviewed and noted the following in the patient's chart:   . Medical and social history . Use of alcohol, tobacco or illicit drugs  . Current medications and supplements . Functional ability and status . Nutritional  status . Physical activity . Advanced directives . List of other physicians . Hospitalizations, surgeries, and ER visits in previous 12 months . Vitals . Screenings to include cognitive, depression, and falls . Referrals and appointments  In addition, I have reviewed and discussed with patient certain preventive protocols, quality metrics, and best practice recommendations. A written personalized care plan for preventive services as well as general preventive health recommendations were provided to patient.     HFWYO,VZCHY, RN   08/25/2018

## 2018-08-26 ENCOUNTER — Ambulatory Visit: Payer: PPO

## 2018-09-01 ENCOUNTER — Ambulatory Visit: Payer: PPO | Admitting: Internal Medicine

## 2018-09-01 ENCOUNTER — Encounter: Payer: Self-pay | Admitting: Internal Medicine

## 2018-09-01 VITALS — BP 120/70 | HR 62 | Ht 67.0 in | Wt 146.0 lb

## 2018-09-01 DIAGNOSIS — I480 Paroxysmal atrial fibrillation: Secondary | ICD-10-CM

## 2018-09-01 DIAGNOSIS — R0602 Shortness of breath: Secondary | ICD-10-CM | POA: Diagnosis not present

## 2018-09-01 DIAGNOSIS — R0609 Other forms of dyspnea: Secondary | ICD-10-CM

## 2018-09-01 MED ORDER — METOPROLOL TARTRATE 100 MG PO TABS: ORAL_TABLET | ORAL | 0 refills | Status: DC

## 2018-09-01 NOTE — Progress Notes (Signed)
PCP: [name], MD   Primary EP: [name]  William Underwood is a 67 y.o. male who presents today for routine electrophysiology followup.  He remains active but continues to have SOB with rigorous exertion.  He finds that when he runs 5 miles that he is extremely fatigued afterwards. He also reports pain over his L upper chest with heavy exertion which is well localized to an area about the size of a quarter with heavy activity.  With onset of exercise, he is winded, however if he "pushes through", his symptoms will abate and he can exercise further.  Today, he denies symptoms of palpitations, chest pain, lower extremity edema, dizziness, presyncope, or syncope.  The patient is otherwise without complaint today.   Past Medical History:  Diagnosis Date  . Benign prostatic hyperplasia without urinary obstruction 06/17/2014  . BPPV (benign paroxysmal positional vertigo) 07/01/2011  . Chronic hyperkalemia 05/20/2012   Overview:  Hx of for years  . Concussion syndrome 05/15/2011   This seems like a moderately severe concussion with at least 1 minute loss of consciousness in 15-20 minutes of significant confusion including being unable to state his name   . DEGENERATIVE JOINT DISEASE, HANDS 10/16/2009   Qualifier: Diagnosis of  By: [name], [name]    . Diverticulosis 06/20/2017  . Hemorrhoids, external 06/20/2017  . Hip pain, left 02/23/2014   Today this primarily appears like a gluteus medius syndrome with a secondary greater trochanteric bursitis   . Inguinal pain, right 08/04/2008   Qualifier: Diagnosis of  By: Hassell Done MD, Mary   US shows hypoechoic change but findings are not clearly diagnostic  Clinically seems like sports hernia  . Left ankle sprain 05/19/2012   This is associated with small avulsion fleck from talus  Note that his RT ankle did have surgery for old ankle fracutre   . Lumbar spondylosis 06/20/2017   Fairly significant L4-L5 facet arthropathy with effusion.  Can consider facet  injections and potentially RFA down the road.  Marland Kitchen NEUROPATHY 03/01/2008   Qualifier: Diagnosis of  By: [name], [name]    . Paroxysmal atrial fibrillation (HCC)   . Right inguinal hernia 06/17/2014  . Strain of gluteus medius 01/29/2017  . Vertigo    Past Surgical History:  Procedure Laterality Date  . ANKLE ARTHROSCOPY    . ATRIAL FIBRILLATION ABLATION N/A 05/25/2018   Procedure: ATRIAL FIBRILLATION ABLATION;  Surgeon: Thompson Grayer, MD;  Location: [facility];  Service: Cardiovascular;  Laterality: N/A;  . KNEE ARTHROSCOPY      ROS- all systems are reviewed and negatives except as per HPI above  Current Outpatient Medications  Medication Sig Dispense Refill  . diazepam (VALIUM) 5 MG tablet Take 1 tablet (5 mg total) by mouth daily as needed for anxiety. 30 tablet 1  . Multiple Vitamins-Minerals (MULTIVITAMIN WITH MINERALS) tablet Take 1 tablet by mouth daily.     . traMADol (ULTRAM) 50 MG tablet Take 1 tablet (50 mg total) by mouth every 6 (six) hours as needed for moderate pain. 30 tablet 1  . Vitamin D, Ergocalciferol, 2000 units CAPS Take 2,000 Units by mouth daily.     . metoprolol tartrate (LOPRESSOR) 100 MG tablet Take 2 hours prior to Cardiac CT 1 tablet 0   No current facility-administered medications for this visit.     Physical Exam: Vitals:   09/01/18 1210  BP: 120/70  Pulse: 62  SpO2: 98%  Weight: 146 lb (66.2 kg)  Height:  5\' 7"  (1.702 m)    GEN- The patient is well appearing, alert and oriented x 3 today.   Head- normocephalic, atraumatic Eyes-  Sclera clear, conjunctiva pink Ears- hearing intact Oropharynx- clear Lungs- Clear to ausculation bilaterally, normal work of breathing Heart- Regular rate and rhythm, no murmurs, rubs or gallops, PMI not laterally displaced GI- soft, NT, ND, + BS Extremities- no clubbing, cyanosis, or edema  Wt Readings from Last 3 Encounters:  09/01/18 146 lb (66.2 kg)  08/23/18 147 lb 12.8 oz (67 kg)  07/15/18 145 lb 6.4  oz (66 kg)   CPX 08/09/18 Exercise testing shows an excellent functional capacity with a pVO2 > 150% of predicted with very high ventilatory threshold suggestive of a high-level of physical conditioning. At peak exercise, the patient is limited by reaching his ventilatory limits which is a common finding in highly motivated, fit individuals. However, the VE/VCO2 slope is markedly elevated and is suggestive of increased pulmonary pressures during exercise but can also be seen in hyperventilatory responses. As the peak respiratory rate is only 45 bpm, hyperventilation is felt less likely but cannot be completely excluded. Given the excellent exercise capacity and recent ablation, I feel it would be reasonable to wait 2-3 months and then retest. If the VE/VCO2 slope remains significantly elevated, consideration could be given to repeat exercise testing with invasive pulmonary pressure monitoring.   EKG tracing ordered today is personally reviewed and shows sinus rhythm 62 bpm, PR 156 msec, QRS 90 msec, QTc 408 msec, early repolarization  Assessment and Plan:  1. Paroxysmal atrial fibrillation No afib s/p ablation off AADs chads2vasc score is 1. Stop xarelto at this time.  2. SOB Stable CPX results reviewed with him at length today, revealing excellent exercise capacity.  He has a h/o moderate pulmonary regurgitation (noted on echo 5/19) which could be contributing.  Recent cardiac CT has raised the issue of possible PV stenosis.  I have discussed CT findings with Dr Lamona Curl 08/05/18 who advises repeat CT 3 months from now.  He does not feel that a more expedited workup is required. I will therefore repeat cardiac CT as advised.  The patient is clear that he cannot wait 3 months due to anxiety but is willing to try to wait 2 months prior to the repeat study. I have also encouraged patient evaluation by Dr Haroldine Laws who is an avid runner and has experience with exercise related functional  limitations and may also be able to provide additional assistance.  Return to see me in 2 months after cardiac CT results are available.   Thompson Grayer MD, [facility] 09/01/2018 5:47 PM

## 2018-09-01 NOTE — Patient Instructions (Addendum)
Medication Instructions:  Your physician has recommended you make the following change in your medication:   STOP: Xarelto  If you need a refill on your cardiac medications before your next appointment, please call your pharmacy.   Lab work: Your physician recommends that you return for lab work (BMET) prior to cardiac CT.   If you have labs (blood work) drawn today and your tests are completely normal, you will receive your results only by: Marland Kitchen MyChart Message (if you have MyChart) OR . A paper copy in the mail If you have any lab test that is abnormal or we need to change your treatment, we will call you to review the results.  Testing/Procedures: Your physician has requested that you have cardiac CT to evaluate for PV stenosis in 2 months. Cardiac computed tomography (CT) is a painless test that uses an x-ray machine to take clear, detailed pictures of your heart. For further information please visit [email or web address]. Please follow instruction sheet as given.   Follow-Up: You have been referred to the [facility] Clinic for Dyspnea on exertion.   Your physician recommends that you schedule a follow-up appointment in: 2 months with Dr. Rayann Heman.  Any Other Special Instructions Will Be Listed Below (If Applicable).   Please arrive at the [facility] main entrance of [facility] on _____at ____ AM (30-45 minutes prior to test start time)  [facility] [address] [place], [place] [address] [phone]  Proceed to the [facility] Radiology Department (First Floor).  Please follow these instructions carefully (unless otherwise directed):   On the Night Before the Test: . Be sure to Drink plenty of water. . Do not consume any caffeinated/decaffeinated beverages or chocolate 12 hours prior to your test. . Do not take any antihistamines 12 hours prior to your test.  On the Day of the Test: . Drink plenty of water. Do not drink any water within one  hour of the test. . Do not eat any food 4 hours prior to the test. . You may take your regular medications prior to the test.  . Take metoprolol (Lopressor) two hours prior to test.      After the Test: . Drink plenty of water. . After receiving IV contrast, you may experience a mild flushed feeling. This is normal. . On occasion, you may experience a mild rash up to 24 hours after the test. This is not dangerous. If this occurs, you can take Benadryl 25 mg and increase your fluid intake. . If you experience trouble breathing, this can be serious. If it is severe call 911 IMMEDIATELY. If it is mild, please call our office.

## 2018-09-09 ENCOUNTER — Telehealth (HOSPITAL_COMMUNITY): Payer: Self-pay | Admitting: Cardiology

## 2018-09-09 NOTE — Telephone Encounter (Signed)
Attempting to schedule a NP appt with Dr Haroldine Laws per a referral from [name].  Left message for patient to return call.

## 2018-09-10 NOTE — Telephone Encounter (Signed)
aapt set 11/03/18 @1120 

## 2018-10-06 ENCOUNTER — Encounter: Payer: Self-pay | Admitting: Family Medicine

## 2018-10-06 ENCOUNTER — Other Ambulatory Visit: Payer: Self-pay

## 2018-10-06 ENCOUNTER — Ambulatory Visit (INDEPENDENT_AMBULATORY_CARE_PROVIDER_SITE_OTHER): Payer: PPO | Admitting: Family Medicine

## 2018-10-06 VITALS — BP 118/62 | HR 57 | Temp 98.0°F | Ht 67.0 in | Wt 149.0 lb

## 2018-10-06 DIAGNOSIS — R109 Unspecified abdominal pain: Secondary | ICD-10-CM

## 2018-10-06 DIAGNOSIS — M791 Myalgia, unspecified site: Secondary | ICD-10-CM

## 2018-10-06 LAB — CBC
HCT: 45.4 % (ref 39.0–52.0)
Hemoglobin: 15.4 g/dL (ref 13.0–17.0)
MCHC: 34 g/dL (ref 30.0–36.0)
MCV: 90.4 fl (ref 78.0–100.0)
Platelets: 207 10*3/uL (ref 150.0–400.0)
RBC: 5.02 Mil/uL (ref 4.22–5.81)
RDW: 13.1 % (ref 11.5–15.5)
WBC: 8.6 10*3/uL (ref 4.0–10.5)

## 2018-10-06 LAB — POC URINALSYSI DIPSTICK (AUTOMATED)
Bilirubin, UA: NEGATIVE
Blood, UA: NEGATIVE
Glucose, UA: NEGATIVE
Ketones, UA: NEGATIVE
Leukocytes, UA: NEGATIVE
Nitrite, UA: NEGATIVE
Protein, UA: NEGATIVE
Spec Grav, UA: 1.01 (ref 1.010–1.025)
Urobilinogen, UA: 0.2 E.U./dL
pH, UA: 6 (ref 5.0–8.0)

## 2018-10-06 LAB — COMPREHENSIVE METABOLIC PANEL
ALT: 13 U/L (ref 0–53)
AST: 17 U/L (ref 0–37)
Albumin: 4.2 g/dL (ref 3.5–5.2)
Alkaline Phosphatase: 47 U/L (ref 39–117)
BUN: 18 mg/dL (ref 6–23)
CO2: 31 mEq/L (ref 19–32)
Calcium: 9.7 mg/dL (ref 8.4–10.5)
Chloride: 104 mEq/L (ref 96–112)
Creatinine, Ser: 0.82 mg/dL (ref 0.40–1.50)
GFR: 99.32 mL/min (ref >60.00)
Glucose, Bld: 87 mg/dL (ref 70–99)
Potassium: 5 mEq/L (ref 3.5–5.1)
Sodium: 140 mEq/L (ref 135–145)
Total Bilirubin: 0.4 mg/dL (ref 0.2–1.2)
Total Protein: 6.6 g/dL (ref 6.0–8.3)

## 2018-10-06 LAB — C-REACTIVE PROTEIN: CRP: 0.3 mg/dL — ABNORMAL LOW (ref 0.5–20.0)

## 2018-10-06 LAB — CK: Total CK: 34 U/L (ref 7–232)

## 2018-10-06 LAB — SEDIMENTATION RATE: Sed Rate: 7 mm/hr (ref 0–20)

## 2018-10-06 LAB — TSH: TSH: 1.08 u[IU]/mL (ref 0.35–4.50)

## 2018-10-06 MED ORDER — DICLOFENAC SODIUM 75 MG PO TBEC: 75.0000 mg | DELAYED_RELEASE_TABLET | Freq: Two times a day (BID) | ORAL | 0 refills | Status: DC

## 2018-10-06 NOTE — Progress Notes (Signed)
   Subjective:  William Underwood is a 67 y.o. male who presents today with a chief complaint of myalgia.   HPI:  Myalgia, new problem Started 3 days ago.  Initially located in right flank just under his ribs.  Symptoms started after going on a 36 mile bike ride.  Pain is progressed over the last couple of days.  Now involves most of his upper back, neck, jaw, and forehead.  He has scattered joint pains as well.  No fevers.  No rashes.  No nausea or vomiting.  Normal appetite.  Pain is not worsened or improved with eating.  No specific treatments tried.  No obvious precipitating events.  No sick contacts.  No vision changes.  No other obvious alleviating or aggravating factors.  ROS: Per HPI  PMH: He reports that he has never smoked. He has never used smokeless tobacco. He reports that he does not drink alcohol or use drugs.  Objective:  Physical Exam: BP 118/62 (BP Location: Left Arm, Patient Position: Sitting, Cuff Size: Normal)   Pulse (!) 57   Temp 98 F (36.7 C) (Oral)   Ht 5\' 7"  (1.702 m)   Wt 149 lb (67.6 kg)   SpO2 99%   BMI 23.34 kg/m    Gen: NAD, resting comfortably HEENT: Bilateral tenderness at temporal areas and over masseters bilaterally.  Prominent temporal veins noted.  TMJ without abnormality bilaterally. CV: RRR with no murmurs appreciated Pulm: NWOB, CTAB with no crackles, wheezes, or rhonchi GI: Normal bowel sounds present. Soft, Nontender, Nondistended.  Tender to palpation along right lower costal margin.  Murphy sign negative. MSK:  -Neck: No deformities.  Tender to palpation throughout.  Full range of motion. -Upper extremities: No deformities.  Strength 5 out of 5 throughout.  Neurovascular intact distally. Skin: Warm, dry Neuro: Cranial nerves III through XII intact. Psych: Normal affect and thought content  Results for orders placed or performed in visit on 10/06/18 (from the past 24 hour(s))  POCT Urinalysis Dipstick (Automated)     Status: None   Collection Time: 10/06/18  9:38 AM  Result Value Ref Range   Color, UA Yellow    Clarity, UA Clear    Glucose, UA Negative Negative   Bilirubin, UA Negative    Ketones, UA Negative    Spec Grav, UA 1.010 1.010 - 1.025   Blood, UA Negative    pH, UA 6.0 5.0 - 8.0   Protein, UA Negative Negative   Urobilinogen, UA 0.2 0.2 or 1.0 E.U./dL   Nitrite, UA Negative    Leukocytes, UA Negative Negative     Assessment/Plan:  Myalgia Unclear etiology.  I think his rib pain is most likely mechanical in nature due to hitting his right iliac crest on his bike ride.  Less clear explanation for his head and neck myalgia. This could potentially represent a viral syndrome, however would expect other symptoms if this were the case.  Given lack of clear diagnosis, will proceed with blood work today.  Check CBC, CMET, TSH, CK, sed rate, and CRP. start diclofenac 75 mg twice daily for the next 1 to 2 weeks.  He also has tramadol 50 mg that he can use 3 times daily as needed.  Discussed reasons to return to care.  [name]. [name], MD 10/06/2018 10:09 AM

## 2018-10-06 NOTE — Patient Instructions (Signed)
It was very nice to see you today!  Please start diclofenac 75 mg twice daily for the next week, then as needed.  You can also take the tramadol as needed.  You should have a refill of this at the pharmacy.  We will check blood work today to make sure there is nothing else going on.  Please have know if your symptoms worsen or do not improve over the next 1 to 2 weeks.  Take care, Dr [name]

## 2018-10-07 NOTE — Progress Notes (Signed)
Dr Marigene Ehlers interpretation of your lab work:  Good news! Your blood work is all NORMAL. You do not have any signs of any inflammation, muscle breakdown, infection, or electrolyte abnormalities. Please continue the anti-inflammatory as we discussed and let me know if your symptoms worsen or do not improve over the next 1-2 weeks.    If you have any additional questions, please give [place] a call or send [place] a message through [place].  Take care, Dr [name]

## 2018-10-28 ENCOUNTER — Telehealth (HOSPITAL_COMMUNITY): Payer: Self-pay | Admitting: Emergency Medicine

## 2018-10-28 NOTE — Telephone Encounter (Signed)
Reaching out to patient to offer assistance regarding upcoming cardiac imaging study; unable to reach patient but name and call back number provided for further questions should they arise Marchia Bond RN Navigator Cardiac Imaging [phone]

## 2018-11-01 ENCOUNTER — Encounter (HOSPITAL_COMMUNITY): Payer: Self-pay

## 2018-11-01 ENCOUNTER — Ambulatory Visit (HOSPITAL_COMMUNITY): Admission: RE | Admit: 2018-11-01 | Payer: PPO | Source: Ambulatory Visit

## 2018-11-01 ENCOUNTER — Ambulatory Visit (HOSPITAL_COMMUNITY)
Admission: RE | Admit: 2018-11-01 | Discharge: 2018-11-01 | Disposition: A | Discharge status: Home / Self Care | Payer: PPO | Source: Ambulatory Visit | Attending: Internal Medicine | Admitting: Internal Medicine

## 2018-11-01 DIAGNOSIS — R0609 Other forms of dyspnea: Secondary | ICD-10-CM

## 2018-11-01 DIAGNOSIS — I480 Paroxysmal atrial fibrillation: Secondary | ICD-10-CM

## 2018-11-01 DIAGNOSIS — R0602 Shortness of breath: Secondary | ICD-10-CM | POA: Diagnosis not present

## 2018-11-01 MED ORDER — IOPAMIDOL (ISOVUE-370) INJECTION 76%
80.0000 mL | Freq: Once | INTRAVENOUS | Status: AC | PRN
  Administered 2018-11-01: 100 mL via INTRAVENOUS

## 2018-11-02 ENCOUNTER — Other Ambulatory Visit: Payer: Self-pay | Admitting: Family Medicine

## 2018-11-03 ENCOUNTER — Encounter: Payer: Self-pay | Admitting: Internal Medicine

## 2018-11-03 ENCOUNTER — Encounter (HOSPITAL_COMMUNITY): Payer: PPO | Admitting: Internal Medicine

## 2018-11-03 ENCOUNTER — Ambulatory Visit: Payer: PPO | Admitting: Internal Medicine

## 2018-11-03 VITALS — BP 120/82 | HR 54 | Ht 67.0 in | Wt 153.0 lb

## 2018-11-03 DIAGNOSIS — I480 Paroxysmal atrial fibrillation: Secondary | ICD-10-CM

## 2018-11-03 NOTE — Patient Instructions (Addendum)
Medication Instructions:  Your physician recommends that you continue on your current medications as directed. Please refer to the Current Medication list given to you today.  * If you need a refill on your cardiac medications before your next appointment, please call your pharmacy.   Labwork: None ordered  Testing/Procedures: None ordered  Follow-Up: Your physician recommends that you schedule a follow-up appointment in: 3-4 weeks with Dr. Rayann Heman.  Thank you for choosing [facility]!!

## 2018-11-03 NOTE — Progress Notes (Signed)
PCP: [name], MD Primary EP: [name]  William Underwood is a 68 y.o. male who presents today for electrophysiology followup.  Since last being seen in our clinic, the patient reports doing reasonably well. He remains very active.  He has no symptoms at rest.  No afib since his ablation.  He remains able to ride his bike for 25-30 miles without any symptoms.  He is running several miles at least twice per week.  In general, he seems to be doing well.  He does continue to find that on steep inclines or with running more than a couple miles that he will develop fatigue, SOB and leg heaviness.  He feels that these symptoms have not worsened since his last visit, but have also not improved. Today, he denies symptoms of fevers, hemoptysis, palpitations, chest pain, shortness of breath,  lower extremity edema, dizziness, presyncope, or syncope.  The patient is otherwise without complaint today.   Past Medical History:  Diagnosis Date  . Benign prostatic hyperplasia without urinary obstruction 06/17/2014  . BPPV (benign paroxysmal positional vertigo) 07/01/2011  . Chronic hyperkalemia 05/20/2012   Overview:  Hx of for years  . Concussion syndrome 05/15/2011   This seems like a moderately severe concussion with at least 1 minute loss of consciousness in 15-20 minutes of significant confusion including being unable to state his name   . DEGENERATIVE JOINT DISEASE, HANDS 10/16/2009   Qualifier: Diagnosis of  By: [name], [name]    . Diverticulosis 06/20/2017  . Hemorrhoids, external 06/20/2017  . Lumbar spondylosis 06/20/2017   Fairly significant L4-L5 facet arthropathy with effusion.  Can consider facet injections and potentially RFA down the road.  Marland Kitchen NEUROPATHY 03/01/2008   Qualifier: Diagnosis of  By: [name], [name]    . Paroxysmal atrial fibrillation (HCC)   . Pulmonary insufficiency 03/09/2018   moderate PI noted on echo 03/09/18  . Pulmonary vein stenosis    left inferior pulmonary vein  stenosis  . Right inguinal hernia 06/17/2014  . Sinus bradycardia   . Strain of gluteus medius 01/29/2017  . Vertigo    Past Surgical History:  Procedure Laterality Date  . ANKLE ARTHROSCOPY    . ATRIAL FIBRILLATION ABLATION N/A 05/25/2018   Procedure: ATRIAL FIBRILLATION ABLATION;  Surgeon: Thompson Grayer, MD;  Location: [facility];  Service: Cardiovascular;  Laterality: N/A;  . KNEE ARTHROSCOPY      ROS- all systems are reviewed and negatives except as per HPI above  Current Outpatient Medications  Medication Sig Dispense Refill  . diazepam (VALIUM) 5 MG tablet Take 1 tablet (5 mg total) by mouth daily as needed for anxiety. 30 tablet 1  . diclofenac (VOLTAREN) 75 MG EC tablet TAKE 1 TABLET BY MOUTH TWICE A DAY 60 tablet 0  . metoprolol tartrate (LOPRESSOR) 100 MG tablet Take 2 hours prior to Cardiac CT 1 tablet 0  . Multiple Vitamins-Minerals (MULTIVITAMIN WITH MINERALS) tablet Take 1 tablet by mouth daily.     . traMADol (ULTRAM) 50 MG tablet Take 1 tablet (50 mg total) by mouth every 6 (six) hours as needed for moderate pain. 30 tablet 1  . Vitamin D, Ergocalciferol, 2000 units CAPS Take 2,000 Units by mouth daily.      No current facility-administered medications for this visit.     Physical Exam: Vitals:   11/03/18 1017  BP: 120/82  Pulse: (!) 54  SpO2: 97%  Weight: 153 lb (69.4 kg)  Height: 5\' 7"  (1.702  m)    GEN- The patient is well appearing, alert and oriented x 3 today.   Head- normocephalic, atraumatic Eyes-  Sclera clear, conjunctiva pink Ears- hearing intact Oropharynx- clear Lungs- Clear to ausculation bilaterally, normal work of breathing Heart- Regular rate and rhythm, no murmurs, rubs or gallops, PMI not laterally displaced GI- soft, NT, ND, + BS Extremities- no clubbing, cyanosis, or edema  Wt Readings from Last 3 Encounters:  11/03/18 153 lb (69.4 kg)  10/06/18 149 lb (67.6 kg)  09/01/18 146 lb (66.2 kg)   Cardiac CT from 11/01/2018 is  personally reviewed with the patient at length in the office today.  I have compared current images to his two prior CTs and have looked at images personally with Dr [name] and have discussed by phone with [name] also.  EKG tracing ordered today is personally reviewed and shows sinus rhythm  Assessment and Plan:  1. Left lower pulmonary stenosis  Unfortunately, repeat CT reveals persistent left inferior pulmonary vein stenosis.  This is only mildly worsened from the 07/19/18 CT per Dr [name], though there is concern for a subsegmental branch occlusion.  There is also new air space opacity which suggests possible hemodynamic significance of the lesion. Fortunately, the patient appears well.  His exercise tolerance is still good, though limited compared to prior baseline.  CPX 08/09/18 revealed excellent peak VO2.  Of note, AF clinic note from 03/02/18 reports that prior to ablation "he had noted that his exercise tolerance was down during a lot of his recent bike rides and long distance runs".  This suggests that some of his symptoms may not be related to PVS.  He also has prior h/o moderate pulmonic insufficiency.  I have discussed the patient's case with [name] previously (as per prior notes).  More recently, I have spoken with [name] at [facility] and also [name] with our practice who has seen PV stenosis when at [facility].  The opinions of each has been that a conservative management approach may be best.  There are certainly risks for venous dilatation and stenting.  It is hard to tell currently if the patients symptoms would warrant these risks.  I have discussed this at length with the patient today.  He and I agree that tertiary referral for second opinion and further management is appropriate. I have offered referrals to [place], [facility], and [facility] as I believe that these institutions may have the most experience with this very rare situation. The patient has  personally done research on PVS and would like to be evaluated at [facility].  He would like to go home and determine the name of the physician that he would like to be referred to and will then contact my office.  I will also reach out to [facility] physicians.  I will assist with tertiary referral at the next available time.    2. Paroxysmal atrial fibrillation No episodes post ablation. chads2vasc score is 1.  As per guidelines, he is not on [facility] therapy at this time.  Return to see me in follow-up in 3-4 weeks.  Tertiary referral is made in the interim.  Thompson Grayer MD, [facility] 11/03/2018

## 2018-11-05 ENCOUNTER — Telehealth: Payer: Self-pay

## 2018-11-05 DIAGNOSIS — R0602 Shortness of breath: Secondary | ICD-10-CM

## 2018-11-05 DIAGNOSIS — Z8679 Personal history of other diseases of the circulatory system: Secondary | ICD-10-CM

## 2018-11-05 DIAGNOSIS — Z9889 Other specified postprocedural states: Secondary | ICD-10-CM

## 2018-11-05 NOTE — Telephone Encounter (Signed)
Referral entered to Dr Shelah Lewandowsky or Dr Higinio Plan at [facility].  Referral will be faxed urgently to these doctors today 11/05/2018.  Will obtain cardiac CT CD's and overnight to below address today 11/05/2018.   Please fax consult request and records to [phone], Attn Dr Hinton Lovely.   Their address is:  [facility]  [address] [place] [address]

## 2018-11-18 ENCOUNTER — Telehealth: Payer: Self-pay | Admitting: Internal Medicine

## 2018-11-18 NOTE — Telephone Encounter (Signed)
New Message    Debbie with [employer] is calling to request a prior authorization for the patient to be able to get a work up for the [facility]  Dr. Rayann Heman spoke with Dr. Oswaldo Milian the medical director in reference to this patient.

## 2018-11-19 ENCOUNTER — Telehealth: Payer: Self-pay

## 2018-11-19 NOTE — Telephone Encounter (Signed)
Prior authorization sent as requested.   I have also spoken with Jackelyn Poling today.

## 2018-11-19 NOTE — Telephone Encounter (Signed)
Paper work being completed and faxed as requested.

## 2018-11-19 NOTE — Telephone Encounter (Signed)
William Underwood with [employer] called following up on the Prior authorization request.  She stated they need:   Ref#[id number]/ Additional Clinical information , Office Notes, Dx codes and Imaging and the requested date of service.   Fax # [phone] Pho # [phone]

## 2018-11-24 ENCOUNTER — Ambulatory Visit: Payer: PPO | Admitting: Internal Medicine

## 2018-11-24 ENCOUNTER — Encounter: Payer: Self-pay | Admitting: Internal Medicine

## 2018-11-24 VITALS — BP 138/80 | HR 68 | Ht 67.0 in | Wt 149.4 lb

## 2018-11-24 DIAGNOSIS — Z8679 Personal history of other diseases of the circulatory system: Secondary | ICD-10-CM | POA: Diagnosis not present

## 2018-11-24 DIAGNOSIS — Z9889 Other specified postprocedural states: Secondary | ICD-10-CM | POA: Diagnosis not present

## 2018-11-24 DIAGNOSIS — I480 Paroxysmal atrial fibrillation: Secondary | ICD-10-CM

## 2018-11-24 MED ORDER — SERTRALINE HCL 25 MG PO TABS: 25.0000 mg | ORAL_TABLET | Freq: Every day | ORAL | 0 refills | Status: DC

## 2018-11-24 MED ORDER — SERTRALINE HCL 50 MG PO TABS: 50.0000 mg | ORAL_TABLET | Freq: Every day | ORAL | 11 refills | Status: DC

## 2018-11-24 NOTE — Patient Instructions (Addendum)
Medication Instructions:  Your physician has recommended you make the following change in your medication:   1.  Start taking Zoloft 25 mg--Take one tablet daily x 7 days.  After 7 days increase to 50 mg daily.  Labwork: None ordered.  Testing/Procedures: None ordered.  Follow-Up: Your physician wants you to follow-up in: 2 weeks with Dr. Rayann Heman.      Any Other Special Instructions Will Be Listed Below (If Applicable).  If you need a refill on your cardiac medications before your next appointment, please call your pharmacy.

## 2018-11-24 NOTE — Progress Notes (Signed)
PCP: [name], MD   Primary EP: [name]  William Underwood is a 68 y.o. male who presents today for routine electrophysiology followup.  Since last being seen in our clinic, the patient reports doing reasonably well.  He has become quite anxious.  He is consumed with concerns related to the possible progression of his PV stenosis.  He has sent numerous messages to our office.  He has been referred to [facility] and has an appointment in March.  Dr Higinio Plan has reviewed his CT scans personally and feels that he is safe to wait until march to see him.  He remains very anxious about this time frame.   His SOB appears stable.  He does have occasional chest discomfort, more noticeable with exercise.  Today, he denies symptoms of palpitations, lower extremity edema, dizziness, presyncope, or syncope.  The patient is otherwise without complaint today.   Past Medical History:  Diagnosis Date  . Benign prostatic hyperplasia without urinary obstruction 06/17/2014  . BPPV (benign paroxysmal positional vertigo) 07/01/2011  . Chronic hyperkalemia 05/20/2012   Overview:  Hx of for years  . Concussion syndrome 05/15/2011   This seems like a moderately severe concussion with at least 1 minute loss of consciousness in 15-20 minutes of significant confusion including being unable to state his name   . DEGENERATIVE JOINT DISEASE, HANDS 10/16/2009   Qualifier: Diagnosis of  By: [name], [name]    . Diverticulosis 06/20/2017  . Hemorrhoids, external 06/20/2017  . Lumbar spondylosis 06/20/2017   Fairly significant L4-L5 facet arthropathy with effusion.  Can consider facet injections and potentially RFA down the road.  Marland Kitchen NEUROPATHY 03/01/2008   Qualifier: Diagnosis of  By: [name], [name]    . Paroxysmal atrial fibrillation (HCC)   . Pulmonary insufficiency 03/09/2018   moderate PI noted on echo 03/09/18  . Pulmonary vein stenosis    left inferior pulmonary vein stenosis  . Right inguinal hernia 06/17/2014   . Sinus bradycardia   . Strain of gluteus medius 01/29/2017  . Vertigo    Past Surgical History:  Procedure Laterality Date  . ANKLE ARTHROSCOPY    . ATRIAL FIBRILLATION ABLATION N/A 05/25/2018   Procedure: ATRIAL FIBRILLATION ABLATION;  Surgeon: Thompson Grayer, MD;  Location: [facility];  Service: Cardiovascular;  Laterality: N/A;  . KNEE ARTHROSCOPY      ROS- all systems are reviewed and negatives except as per HPI above  Current Outpatient Medications  Medication Sig Dispense Refill  . diazepam (VALIUM) 5 MG tablet Take 1 tablet (5 mg total) by mouth daily as needed for anxiety. 30 tablet 1  . diclofenac (VOLTAREN) 75 MG EC tablet TAKE 1 TABLET BY MOUTH TWICE A DAY 60 tablet 0  . metoprolol tartrate (LOPRESSOR) 100 MG tablet Take 2 hours prior to Cardiac CT 1 tablet 0  . Multiple Vitamins-Minerals (MULTIVITAMIN WITH MINERALS) tablet Take 1 tablet by mouth daily.     . traMADol (ULTRAM) 50 MG tablet Take 1 tablet (50 mg total) by mouth every 6 (six) hours as needed for moderate pain. 30 tablet 1  . Vitamin D, Ergocalciferol, 2000 units CAPS Take 2,000 Units by mouth daily.      No current facility-administered medications for this visit.     Physical Exam: Vitals:   11/24/18 1540  BP: 138/80  Pulse: 68  SpO2: 99%  Weight: 149 lb 6.4 oz (67.8 kg)  Height: 5\' 7"  (1.702 m)    GEN- The patient  is anxious appearing, alert and oriented x 3 today.   Head- normocephalic, atraumatic Eyes-  Sclera clear, conjunctiva pink Ears- hearing intact Oropharynx- clear Lungs- Clear to ausculation bilaterally, normal work of breathing Heart- Regular rate and rhythm, no murmurs, rubs or gallops, PMI not laterally displaced GI- soft, NT, ND, + BS Extremities- no clubbing, cyanosis, or edema  Wt Readings from Last 3 Encounters:  11/24/18 149 lb 6.4 oz (67.8 kg)  11/03/18 153 lb (69.4 kg)  10/06/18 149 lb (67.6 kg)    EKG tracing ordered today is personally reviewed and shows  sinus  Assessment and Plan:  1. Anxiety I worry about Mr Rademaker level of anxiety.  This is somewhat out of proportion to his clinical picture and overall prognosis.  I have tried to reassure him today.  He has follow-up in early March scheduled at [facility] and I have reassured him that Dr Higinio Plan has reviewed his CT personally and feels that this is an appropriate timing for his office visit. He has valium that he has taken for anxiety previously.  After a long discussion today, he and I have decided to start him on low dose Zoloft. I will start zoloft 25mg  daily today.  He can increase to 50mg  daily after 7 days. I will see him again in 2 weeks.  2. PV stenosis As above Follow-up is planned at [facility] in early March  3. paroxysmal atrial fibrillation Resolved post ablation  Return in 2 weeks  Thompson Grayer MD, [facility] 11/24/2018 3:59 PM

## 2018-12-08 ENCOUNTER — Encounter: Payer: Self-pay | Admitting: Internal Medicine

## 2018-12-08 ENCOUNTER — Ambulatory Visit: Payer: PPO | Admitting: Internal Medicine

## 2018-12-08 VITALS — BP 114/76 | HR 61 | Ht 67.0 in | Wt 150.0 lb

## 2018-12-08 DIAGNOSIS — I480 Paroxysmal atrial fibrillation: Secondary | ICD-10-CM

## 2018-12-08 NOTE — Progress Notes (Signed)
PCP: [name], MD   Primary EP: [name]  William Underwood is a 68 y.o. male who presents today for electrophysiology followup.  Since last being seen in our clinic, the patient reports doing much better. His anxiety is improved.  He seems to be in a better emotional state. His chest pain is near resolved.  SOB is improved.  Less hoarse.  Today, he denies symptoms of cough, hemoptysis, fevers, chills, palpitations, lower extremity edema, dizziness, presyncope, or syncope.  The patient is otherwise without complaint today.   Past Medical History:  Diagnosis Date  . Benign prostatic hyperplasia without urinary obstruction 06/17/2014  . BPPV (benign paroxysmal positional vertigo) 07/01/2011  . Chronic hyperkalemia 05/20/2012   Overview:  Hx of for years  . Concussion syndrome 05/15/2011   This seems like a moderately severe concussion with at least 1 minute loss of consciousness in 15-20 minutes of significant confusion including being unable to state his name   . DEGENERATIVE JOINT DISEASE, HANDS 10/16/2009   Qualifier: Diagnosis of  By: [name], [name]    . Diverticulosis 06/20/2017  . Hemorrhoids, external 06/20/2017  . Lumbar spondylosis 06/20/2017   Fairly significant L4-L5 facet arthropathy with effusion.  Can consider facet injections and potentially RFA down the road.  Marland Kitchen NEUROPATHY 03/01/2008   Qualifier: Diagnosis of  By: [name], [name]    . Paroxysmal atrial fibrillation (HCC)   . Pulmonary insufficiency 03/09/2018   moderate PI noted on echo 03/09/18  . Pulmonary vein stenosis    left inferior pulmonary vein stenosis  . Right inguinal hernia 06/17/2014  . Sinus bradycardia   . Strain of gluteus medius 01/29/2017  . Vertigo    Past Surgical History:  Procedure Laterality Date  . ANKLE ARTHROSCOPY    . ATRIAL FIBRILLATION ABLATION N/A 05/25/2018   Procedure: ATRIAL FIBRILLATION ABLATION;  Surgeon: Thompson Grayer, MD;  Location: [facility];  Service: Cardiovascular;   Laterality: N/A;  . KNEE ARTHROSCOPY      ROS- all systems are reviewed and negatives except as per HPI above  Current Outpatient Medications  Medication Sig Dispense Refill  . diazepam (VALIUM) 5 MG tablet Take 1 tablet (5 mg total) by mouth daily as needed for anxiety. 30 tablet 1  . diclofenac (VOLTAREN) 75 MG EC tablet TAKE 1 TABLET BY MOUTH TWICE A DAY 60 tablet 0  . metoprolol tartrate (LOPRESSOR) 100 MG tablet Take 2 hours prior to Cardiac CT 1 tablet 0  . Multiple Vitamins-Minerals (MULTIVITAMIN WITH MINERALS) tablet Take 1 tablet by mouth daily.     . sertraline (ZOLOFT) 25 MG tablet Take 1 tablet (25 mg total) by mouth daily for 7 days. 7 tablet 0  . sertraline (ZOLOFT) 50 MG tablet Take 1 tablet (50 mg total) by mouth daily. 30 tablet 11  . traMADol (ULTRAM) 50 MG tablet Take 1 tablet (50 mg total) by mouth every 6 (six) hours as needed for moderate pain. 30 tablet 1  . Vitamin D, Ergocalciferol, 2000 units CAPS Take 2,000 Units by mouth daily.      No current facility-administered medications for this visit.     Physical Exam: Vitals:   12/08/18 1236  BP: 114/76  Pulse: 61  SpO2: 97%  Weight: 150 lb (68 kg)  Height: 5\' 7"  (1.702 m)    GEN- The patient is much less anxious appearing, alert and oriented x 3 today.   Head- normocephalic, atraumatic Eyes-  Sclera clear, conjunctiva pink Ears-  hearing intact Oropharynx- clear Lungs- Clear to ausculation bilaterally, normal work of breathing Heart- Regular rate and rhythm, no murmurs, rubs or gallops, PMI not laterally displaced GI- soft, NT, ND, + BS Extremities- no clubbing, cyanosis, or edema  Wt Readings from Last 3 Encounters:  12/08/18 150 lb (68 kg)  11/24/18 149 lb 6.4 oz (67.8 kg)  11/03/18 153 lb (69.4 kg)   Assessment and Plan:  1. Anxiety Much improved He decided to not take zoloft but has script if needed  2. PV stenosis Clinically a little better Follow-up is planned at [facility] in  early March  3. paroxysmal atrial fibrillation Resolved post ablation  Return in 4 weeks (as followup to his visit to [facility])  Thompson Grayer MD, [facility] 12/08/2018 2:04 PM

## 2018-12-08 NOTE — Patient Instructions (Addendum)
Medication Instructions:  Your physician recommends that you continue on your current medications as directed. Please refer to the Current Medication list given to you today.  Labwork: None ordered.  Testing/Procedures: None ordered.  Follow-Up: Your physician wants you to follow-up in: mid March with Dr. Rayann Heman.      Any Other Special Instructions Will Be Listed Below (If Applicable).  If you need a refill on your cardiac medications before your next appointment, please call your pharmacy.

## 2018-12-28 DIAGNOSIS — R0789 Other chest pain: Secondary | ICD-10-CM | POA: Diagnosis not present

## 2018-12-28 DIAGNOSIS — R0609 Other forms of dyspnea: Secondary | ICD-10-CM | POA: Diagnosis not present

## 2018-12-28 DIAGNOSIS — Z8679 Personal history of other diseases of the circulatory system: Secondary | ICD-10-CM | POA: Diagnosis not present

## 2018-12-28 DIAGNOSIS — R001 Bradycardia, unspecified: Secondary | ICD-10-CM | POA: Diagnosis not present

## 2018-12-28 DIAGNOSIS — Q268 Other congenital malformations of great veins: Secondary | ICD-10-CM | POA: Diagnosis not present

## 2019-01-02 DIAGNOSIS — Q268 Other congenital malformations of great veins: Secondary | ICD-10-CM | POA: Insufficient documentation

## 2019-01-14 DIAGNOSIS — Q268 Other congenital malformations of great veins: Secondary | ICD-10-CM | POA: Diagnosis not present

## 2019-01-19 ENCOUNTER — Ambulatory Visit: Payer: PPO | Admitting: Internal Medicine

## 2019-01-21 ENCOUNTER — Telehealth: Payer: Self-pay | Admitting: Internal Medicine

## 2019-01-21 NOTE — Telephone Encounter (Signed)
  Patient is returning a call he received from [name] requesting a call back

## 2019-01-24 NOTE — Telephone Encounter (Signed)
Dr. Rayann Heman advised of Pt returning call.  No further action needed.

## 2019-01-27 ENCOUNTER — Telehealth: Payer: Self-pay | Admitting: Internal Medicine

## 2019-01-27 NOTE — Telephone Encounter (Signed)
I spoke at length with the patient today about his recent visit to [facility] and their discussions.  I have also reviewed notes from [facility] from March.  William Underwood has interests in speaking with [name] regarding long term imaging of PVS and her prior experience with treatments at [facility].

## 2019-01-28 ENCOUNTER — Telehealth: Payer: Self-pay

## 2019-01-28 NOTE — Telephone Encounter (Signed)
error 

## 2019-01-28 NOTE — Telephone Encounter (Addendum)
Would be happy to arrange a visit with the patient in the coming weeks. Jeneen Rinks, can you share those [facility] notes with me or the discussion so we can pick up where they left off, I anticipate many of the recommendations will be similar.  After review we can arrange a visit.

## 2019-01-31 ENCOUNTER — Telehealth: Payer: Self-pay | Admitting: Internal Medicine

## 2019-01-31 DIAGNOSIS — Q268 Other congenital malformations of great veins: Secondary | ICD-10-CM

## 2019-01-31 NOTE — Telephone Encounter (Signed)
Pt down to see [facility] 02-07-19, I called those patients today to offer Mychart visit. Pt asking if he stilll needs this appt for now d/t being referred to [facility]? Looks like you spoke to her on 01-27-19 and she wanting the [facility] notes, before scheduling appt. Didn't know if we had sent that info yet or not. Pt asking to talk to her in the next few days if possible.

## 2019-02-01 NOTE — Telephone Encounter (Signed)
I think [name] has everything that she needs (available in Epic).

## 2019-02-01 NOTE — Telephone Encounter (Signed)
Per Dr. Rayann Heman refer to Dr. Margaretann Loveless for consult.

## 2019-02-03 ENCOUNTER — Other Ambulatory Visit: Payer: Self-pay

## 2019-02-03 ENCOUNTER — Telehealth: Payer: Self-pay | Admitting: Internal Medicine

## 2019-02-03 NOTE — Telephone Encounter (Addendum)
° ° °  Patient is calling to request appointment with Dr Margaretann Loveless within 1 week, before going to the [facility]. Would this patient be appropriate for a virtual visit? Please Advise

## 2019-02-03 NOTE — Telephone Encounter (Signed)
° ° °  Patient schedule for 4/10 @9  virtual visit  Patient scheduled to travel to [facility] on 4/20, procedure date 4/22

## 2019-02-03 NOTE — Telephone Encounter (Signed)
We can set him up for a virtual video visit this afternoon at 2 or 3 pm or tomorrow morning.

## 2019-02-03 NOTE — Telephone Encounter (Signed)
Smartphone/ my chart/ pre reg completed °

## 2019-02-04 ENCOUNTER — Telehealth (INDEPENDENT_AMBULATORY_CARE_PROVIDER_SITE_OTHER): Payer: PPO | Admitting: Internal Medicine

## 2019-02-04 DIAGNOSIS — Q268 Other congenital malformations of great veins: Secondary | ICD-10-CM

## 2019-02-04 DIAGNOSIS — I288 Other diseases of pulmonary vessels: Secondary | ICD-10-CM

## 2019-02-04 NOTE — Progress Notes (Signed)
Virtual Visit via Video Note   This visit type was conducted due to national recommendations for restrictions regarding the COVID-19 Pandemic (e.g. social distancing) in an effort to limit this patient's exposure and mitigate transmission in our community.  Due to his co-morbid illnesses, this patient is at least at moderate risk for complications without adequate follow up.  This format is felt to be most appropriate for this patient at this time.  All issues noted in this document were discussed and addressed.  A limited physical exam was performed with this format.  Please refer to the patient's chart for his consent to telehealth for [facility].   Evaluation Performed:  Follow-up visit  Date:  02/04/2019   ID:  William Underwood, DOB 1951/06/11, MRN [id number]  Patient Location: Home  Provider Location: Home  PCP:  [name], MD  Cardiologist:  No primary care provider on file.  Electrophysiologist:  None   Chief Complaint: Symptomatic pulmonary vein stenosis status post A. fib ablation  History of Present Illness:    William Underwood is a 68 y.o. male who presents via audio/video conferencing for a telehealth visit today.    Mr. Gorter is a primary patient of Dr. Thompson Grayer in electrophysiology.  I am seeing the patient today via telehealth at the request of Dr. Rayann Heman to continue to discuss and answer questions regarding pulmonary vein stenosis after atrial fibrillation ablation and coordinate any necessary cardiovascular imaging.  Patient tells me that prior to developing atrial fibrillation and his first hospital visit in April 2019, he was a very active endurance athlete, running a marathon once a month and biking 100 miles without difficulty.  He began to notice with paroxysmal atrial fibrillation that his exercise capacity decreased.  He tells me that when not in atrial fibrillation he felt his exercise capacity was optimal, but would notice a significant difference  while in atrial fibrillation.  Given a very low resting heart rate the patient tells me that he opted for atrial fibrillation ablation as management strategy for symptomatic paroxysmal atrial fibrillation.  This was performed May 18, 2018.  In late August and early September 2019 he began to notice a dry cough and decreased exercise tolerance with shortness of breath.  Over the next several months his evaluation revealed left lower pulmonary vein stenosis on CT with possible area of associated mild pulmonary hemorrhage from pulmonary venous infarct.  He had multiple discussions with his cardiologist Dr. Rayann Heman regarding the management of symptomatic pulmonary vein stenosis.  Due to concerns of lifestyle limitation, he was offered a second opinion at a referral center and the patient chose to visit [facility] clinic.  As part of his work-up he had treadmill stress test and an oxygen consumption treadmill stress test both of which revealed adequate functional capacity.  He underwent a VQ scan at [facility] clinic demonstrating possible low perfusion in the left lung with no apparent discordant findings pre-or post exercise.  He discussed these findings with Dr. Higinio Plan in [facility]/interventional cardiology and a plan for discussion of pulmonary vein stenting was had.  The patient and I have discussed at length imaging recommendations regarding pulmonary vein stenosis and potential follow-up after pulmonary vein stenting.  I have informed the patient that my recommendations and opinion are from an imaging perspective, and I would defer management of pulmonary vein stenosis, pulmonary vein stenting, and atrial fibrillation follow-up to his electrophysiologist Dr. Rayann Heman as well as his interventional cardiologist at the [facility] clinic Dr.  Ghobrial.   The patient does not have symptoms concerning for COVID-19 infection (fever, chills, cough, or new shortness of breath).    Past Medical History:  Diagnosis Date  .  Benign prostatic hyperplasia without urinary obstruction 06/17/2014  . BPPV (benign paroxysmal positional vertigo) 07/01/2011  . Chronic hyperkalemia 05/20/2012   Overview:  Hx of for years  . Concussion syndrome 05/15/2011   This seems like a moderately severe concussion with at least 1 minute loss of consciousness in 15-20 minutes of significant confusion including being unable to state his name   . DEGENERATIVE JOINT DISEASE, HANDS 10/16/2009   Qualifier: Diagnosis of  By: [name], [name]    . Diverticulosis 06/20/2017  . Hemorrhoids, external 06/20/2017  . Lumbar spondylosis 06/20/2017   Fairly significant L4-L5 facet arthropathy with effusion.  Can consider facet injections and potentially RFA down the road.  Marland Kitchen NEUROPATHY 03/01/2008   Qualifier: Diagnosis of  By: [name], [name]    . Paroxysmal atrial fibrillation (HCC)   . Pulmonary insufficiency 03/09/2018   moderate PI noted on echo 03/09/18  . Pulmonary vein stenosis    left inferior pulmonary vein stenosis  . Right inguinal hernia 06/17/2014  . Sinus bradycardia   . Strain of gluteus medius 01/29/2017  . Vertigo    Past Surgical History:  Procedure Laterality Date  . ANKLE ARTHROSCOPY    . ATRIAL FIBRILLATION ABLATION N/A 05/25/2018   Procedure: ATRIAL FIBRILLATION ABLATION;  Surgeon: Thompson Grayer, MD;  Location: [facility];  Service: Cardiovascular;  Laterality: N/A;  . KNEE ARTHROSCOPY       Current Meds  Medication Sig  . apixaban (ELIQUIS) 5 MG TABS tablet Take 5 mg by mouth 2 (two) times daily.  . diazepam (VALIUM) 5 MG tablet Take 1 tablet (5 mg total) by mouth daily as needed for anxiety.  . Multiple Vitamins-Minerals (MULTIVITAMIN WITH MINERALS) tablet Take 1 tablet by mouth daily.   . traMADol (ULTRAM) 50 MG tablet Take 1 tablet (50 mg total) by mouth every 6 (six) hours as needed for moderate pain.  . Vitamin D, Ergocalciferol, 2000 units CAPS Take 2,000 Units by mouth daily.      Allergies:   Cefdinir and  Codeine   Social History   Tobacco Use  . Smoking status: Never Smoker  . Smokeless tobacco: Never Used  Substance Use Topics  . Alcohol use: No  . Drug use: No     Family Hx: The patient's family history includes Hearing loss in his father. There is no history of Colon cancer.  ROS:   Please see the history of present illness.     All other systems reviewed and are negative.   Prior CV studies:   The following studies were reviewed today:  Record review of his visit to the [facility] clinic as well as review of imaging studies performed at [facility].  Labs/Other Tests and Data Reviewed:    EKG:  No ECG reviewed.  Recent Labs: 10/06/2018: ALT 13; BUN 18; Creatinine, Ser 0.82; Hemoglobin 15.4; Platelets 207.0; Potassium 5.0; Sodium 140; TSH 1.08   Recent Lipid Panel Lab Results  Component Value Date/Time   CHOL 165 08/23/2018 12:05 PM   TRIG 57.0 08/23/2018 12:05 PM   HDL 57.60 08/23/2018 12:05 PM   CHOLHDL 3 08/23/2018 12:05 PM   LDLCALC 96 08/23/2018 12:05 PM    Wt Readings from Last 3 Encounters:  02/04/19 145 lb (65.8 kg)  12/08/18 150 lb (68 kg)  11/24/18 149 lb 6.4 oz (67.8 kg)     Objective:    Vital Signs:  BP 118/72   Pulse 60   Ht 5\' 7"  (1.702 m)   Wt 145 lb (65.8 kg)   BMI 22.71 kg/m    Well nourished, well developed male in no acute distress.   ASSESSMENT & PLAN:    1. Pulmonary vein stenosis    I have independently reviewed the images from his CT scans performed at our institution.  I have also reviewed the paper records available in care everywhere from his visit to [facility] clinic.  We discussed that this is a challenging decision, but a decision that should be made between the patient and the interventional cardiologist performing the potential stenting procedure.  I suggested that consideration of an elective procedure should be done with an eye towards reasonable symptomatic improvement.  He feels quite limited in his  lifestyle, and would like the opportunity to return to his endurance athletics.  The patient and I discussed at length the recommendations of the [facility] clinic.  I have informed the patient that I do not have experience performing or assisting with pulmonary vein stenting procedures.  I have shared with him my experience from a noninvasive imaging perspective while in advanced imaging fellowship at the [facility] and I have offered him a second opinion and e-consultation coordination with electrophysiology and interventional cardiology at the [facility].  He will think about this but has not requested this consultation at this time.  He is pleased with his care at the [facility] clinic.  The patient inquires as to whether he needs an interval CT scan prior to visiting the [facility] clinic, at our institution, to track the progression of his pulmonary vein stenosis. He tells me that this was not the recommendation of the [facility] clinic cardiologist nor the recommendation of Dr. Rayann Heman, but something the patient was curious about.  We discussed the natural history of pulmonary vein stenosis after A. fib ablation as well as his particular areas and degrees of stenosis.  We have also discussed that measurement and dimensions of pulmonary veins are subject to inter-reader variability, and that it is unlikely that if there are minor variations in the degree of stenosis, that these represent true significant change.  We have discussed that  cumulative annual radiation with CT scans and interventional procedures is prudent to consider, and that we should be judicious about radiation with medical testing.  If he is planning to visit the [facility] clinic and have follow-up, I would defer to his interventional cardiologist for recommendations on pre-and post procedural imaging so as to minimize duplication or unnecessary testing.  If his interventional cardiologist would like imaging performed at her institution so  that she may have access to the images for independent review, I have encouraged him to follow her plan.  However if imaging can be performed at our institution I would happily coordinate with his [facility] clinic cardiologists and perform cardiac imaging studies as needed per their recommended protocols.  The patient would like to discuss the risks and benefits of pulmonary vein stenting.  I have shared with the patient that risks, benefits, and alternatives should be discussed with the physician performing the procedure at [facility] clinic, and quotes for risks such as stroke or pericardial effusion will be provided by the interventionalist as these can be specific to the institution and operator.  I would defer to Dr. Mervin Kung expertise on all facets that pertain to the  procedure and postprocedural imaging.   We have had a very pleasant discussion, and the patient is extremely well-informed and well read about his condition.  The patient will continue to follow with Dr. Thompson Grayer regarding his cardiovascular care, and I have offered to the patient that I am available for imaging related questions as we go forward.  COVID-19 Education: The signs and symptoms of COVID-19 were discussed with the patient and how to seek care for testing (follow up with PCP or arrange E-visit).  The importance of social distancing was discussed today.  Time:   Today, I have spent 60 minutes with the patient with telehealth technology discussing the above problems.    Medication Adjustments/Labs and Tests Ordered: Current medicines are reviewed at length with the patient today.  Concerns regarding medicines are outlined above.  Tests Ordered: No orders of the defined types were placed in this encounter.  Medication Changes: No orders of the defined types were placed in this encounter.   Disposition:  Follow up prn  Signed, [name], MD  02/04/2019 11:12 AM    [facility]   Medication Instructions:  Your Physician recommend you continue on your current medication as directed.     Lab work: None   Testing/Procedures: None  Follow-Up: Your physician recommends that you schedule a follow-up appointment as needed with Dr. Margaretann Loveless.

## 2019-02-04 NOTE — Patient Instructions (Signed)
Medication Instructions:  Your Physician recommend you continue on your current medication as directed.     Lab work: None   Testing/Procedures: None  Follow-Up: Your physician recommends that you schedule a follow-up appointment as needed with Dr. Margaretann Loveless.

## 2019-02-15 DIAGNOSIS — R9431 Abnormal electrocardiogram [ECG] [EKG]: Secondary | ICD-10-CM | POA: Diagnosis not present

## 2019-02-15 DIAGNOSIS — I288 Other diseases of pulmonary vessels: Secondary | ICD-10-CM | POA: Diagnosis not present

## 2019-02-15 DIAGNOSIS — Z9911 Dependence on respirator [ventilator] status: Secondary | ICD-10-CM | POA: Diagnosis not present

## 2019-02-15 DIAGNOSIS — I480 Paroxysmal atrial fibrillation: Secondary | ICD-10-CM | POA: Diagnosis not present

## 2019-02-15 DIAGNOSIS — Z7901 Long term (current) use of anticoagulants: Secondary | ICD-10-CM | POA: Diagnosis not present

## 2019-02-15 DIAGNOSIS — Z4682 Encounter for fitting and adjustment of non-vascular catheter: Secondary | ICD-10-CM | POA: Diagnosis not present

## 2019-02-15 DIAGNOSIS — R0602 Shortness of breath: Secondary | ICD-10-CM | POA: Diagnosis not present

## 2019-02-15 DIAGNOSIS — R001 Bradycardia, unspecified: Secondary | ICD-10-CM | POA: Diagnosis not present

## 2019-02-15 DIAGNOSIS — T17498A Other foreign object in trachea causing other injury, initial encounter: Secondary | ICD-10-CM | POA: Diagnosis not present

## 2019-02-15 DIAGNOSIS — Z9889 Other specified postprocedural states: Secondary | ICD-10-CM | POA: Diagnosis not present

## 2019-02-15 DIAGNOSIS — I97618 Postprocedural hemorrhage and hematoma of a circulatory system organ or structure following other circulatory system procedure: Secondary | ICD-10-CM | POA: Diagnosis not present

## 2019-02-15 DIAGNOSIS — Q268 Other congenital malformations of great veins: Secondary | ICD-10-CM | POA: Diagnosis not present

## 2019-02-15 DIAGNOSIS — I77810 Thoracic aortic ectasia: Secondary | ICD-10-CM | POA: Diagnosis not present

## 2019-02-15 DIAGNOSIS — I370 Nonrheumatic pulmonary valve stenosis: Secondary | ICD-10-CM | POA: Diagnosis not present

## 2019-02-15 DIAGNOSIS — Y838 Other surgical procedures as the cause of abnormal reaction of the patient, or of later complication, without mention of misadventure at the time of the procedure: Secondary | ICD-10-CM | POA: Diagnosis not present

## 2019-02-15 DIAGNOSIS — R042 Hemoptysis: Secondary | ICD-10-CM | POA: Diagnosis not present

## 2019-02-15 DIAGNOSIS — R002 Palpitations: Secondary | ICD-10-CM | POA: Diagnosis not present

## 2019-02-16 DIAGNOSIS — R9431 Abnormal electrocardiogram [ECG] [EKG]: Secondary | ICD-10-CM | POA: Diagnosis not present

## 2019-02-16 DIAGNOSIS — R001 Bradycardia, unspecified: Secondary | ICD-10-CM | POA: Diagnosis not present

## 2019-03-07 DIAGNOSIS — D171 Benign lipomatous neoplasm of skin and subcutaneous tissue of trunk: Secondary | ICD-10-CM | POA: Diagnosis not present

## 2019-03-07 DIAGNOSIS — L218 Other seborrheic dermatitis: Secondary | ICD-10-CM | POA: Diagnosis not present

## 2019-03-07 DIAGNOSIS — L638 Other alopecia areata: Secondary | ICD-10-CM | POA: Diagnosis not present

## 2019-03-07 DIAGNOSIS — D1722 Benign lipomatous neoplasm of skin and subcutaneous tissue of left arm: Secondary | ICD-10-CM | POA: Diagnosis not present

## 2019-04-16 DIAGNOSIS — Z20828 Contact with and (suspected) exposure to other viral communicable diseases: Secondary | ICD-10-CM | POA: Diagnosis not present

## 2019-04-17 ENCOUNTER — Emergency Department (HOSPITAL_COMMUNITY)
Admission: EM | Admit: 2019-04-17 | Discharge: 2019-04-17 | Disposition: A | Discharge status: Home / Self Care | Payer: PPO | Attending: Emergency Medicine | Admitting: Emergency Medicine

## 2019-04-17 ENCOUNTER — Emergency Department (HOSPITAL_COMMUNITY): Payer: PPO

## 2019-04-17 ENCOUNTER — Other Ambulatory Visit: Payer: Self-pay

## 2019-04-17 DIAGNOSIS — R42 Dizziness and giddiness: Secondary | ICD-10-CM | POA: Insufficient documentation

## 2019-04-17 DIAGNOSIS — R2689 Other abnormalities of gait and mobility: Secondary | ICD-10-CM | POA: Diagnosis present

## 2019-04-17 DIAGNOSIS — Z8679 Personal history of other diseases of the circulatory system: Secondary | ICD-10-CM | POA: Diagnosis not present

## 2019-04-17 DIAGNOSIS — R208 Other disturbances of skin sensation: Secondary | ICD-10-CM | POA: Diagnosis not present

## 2019-04-17 DIAGNOSIS — Z79899 Other long term (current) drug therapy: Secondary | ICD-10-CM | POA: Diagnosis not present

## 2019-04-17 DIAGNOSIS — R110 Nausea: Secondary | ICD-10-CM | POA: Diagnosis not present

## 2019-04-17 DIAGNOSIS — R531 Weakness: Secondary | ICD-10-CM | POA: Diagnosis not present

## 2019-04-17 DIAGNOSIS — Z7901 Long term (current) use of anticoagulants: Secondary | ICD-10-CM | POA: Insufficient documentation

## 2019-04-17 DIAGNOSIS — G459 Transient cerebral ischemic attack, unspecified: Secondary | ICD-10-CM | POA: Diagnosis not present

## 2019-04-17 LAB — RAPID URINE DRUG SCREEN, HOSP PERFORMED
Amphetamines: NOT DETECTED
Barbiturates: NOT DETECTED
Benzodiazepines: NOT DETECTED
Cocaine: NOT DETECTED
Opiates: NOT DETECTED
Tetrahydrocannabinol: NOT DETECTED

## 2019-04-17 LAB — COMPREHENSIVE METABOLIC PANEL
ALT: 24 U/L (ref 0–44)
AST: 40 U/L (ref 15–41)
Albumin: 4.2 g/dL (ref 3.5–5.0)
Alkaline Phosphatase: 56 U/L (ref 38–126)
Anion gap: 9 (ref 5–15)
BUN: 19 mg/dL (ref 8–23)
CO2: 22 mmol/L (ref 22–32)
Calcium: 9.6 mg/dL (ref 8.9–10.3)
Chloride: 103 mmol/L (ref 98–111)
Creatinine, Ser: 0.92 mg/dL (ref 0.61–1.24)
GFR calc Af Amer: >60 mL/min (ref >60)
GFR calc non Af Amer: >60 mL/min (ref >60)
Glucose, Bld: 102 mg/dL — ABNORMAL HIGH (ref 70–99)
Potassium: 4.2 mmol/L (ref 3.5–5.1)
Sodium: 134 mmol/L — ABNORMAL LOW (ref 135–145)
Total Bilirubin: 0.8 mg/dL (ref 0.3–1.2)
Total Protein: 6.8 g/dL (ref 6.5–8.1)

## 2019-04-17 LAB — URINALYSIS, ROUTINE W REFLEX MICROSCOPIC
Bilirubin Urine: NEGATIVE
Glucose, UA: NEGATIVE mg/dL
Hgb urine dipstick: NEGATIVE
Ketones, ur: 5 mg/dL — AB
Leukocytes,Ua: NEGATIVE
Nitrite: NEGATIVE
Protein, ur: NEGATIVE mg/dL
Specific Gravity, Urine: 1.014 (ref 1.005–1.030)
pH: 6 (ref 5.0–8.0)

## 2019-04-17 LAB — DIFFERENTIAL
Abs Immature Granulocytes: 0.06 10*3/uL (ref 0.00–0.07)
Basophils Absolute: 0 10*3/uL (ref 0.0–0.1)
Basophils Relative: 0 %
Eosinophils Absolute: 0 10*3/uL (ref 0.0–0.5)
Eosinophils Relative: 0 %
Immature Granulocytes: 1 %
Lymphocytes Relative: 9 %
Lymphs Abs: 1.1 10*3/uL (ref 0.7–4.0)
Monocytes Absolute: 0.6 10*3/uL (ref 0.1–1.0)
Monocytes Relative: 5 %
Neutro Abs: 10 10*3/uL — ABNORMAL HIGH (ref 1.7–7.7)
Neutrophils Relative %: 85 %

## 2019-04-17 LAB — PROTIME-INR
INR: 1.1 (ref 0.8–1.2)
Prothrombin Time: 13.8 seconds (ref 11.4–15.2)

## 2019-04-17 LAB — CBC
HCT: 45.1 % (ref 39.0–52.0)
Hemoglobin: 14.7 g/dL (ref 13.0–17.0)
MCH: 29.6 pg (ref 26.0–34.0)
MCHC: 32.6 g/dL (ref 30.0–36.0)
MCV: 90.9 fL (ref 80.0–100.0)
Platelets: 196 10*3/uL (ref 150–400)
RBC: 4.96 MIL/uL (ref 4.22–5.81)
RDW: 12.7 % (ref 11.5–15.5)
WBC: 11.7 10*3/uL — ABNORMAL HIGH (ref 4.0–10.5)
nRBC: 0 % (ref 0.0–0.2)

## 2019-04-17 LAB — ETHANOL: Alcohol, Ethyl (B): <10 mg/dL (ref <10)

## 2019-04-17 LAB — APTT: aPTT: 25 seconds (ref 24–36)

## 2019-04-17 NOTE — ED Notes (Signed)
Patient transported to MRI 

## 2019-04-17 NOTE — ED Notes (Signed)
Urine Culture sent to Main Lab with UA 

## 2019-04-17 NOTE — ED Triage Notes (Signed)
Pt here with complaints of decreased sensation to the left side around 1130 today this morning while riding a bike. Pt had weakness to the left side. This lasted about 10 minutes. Pt states symptoms are now gone and have not returned.

## 2019-04-17 NOTE — ED Provider Notes (Signed)
[facility] EMERGENCY DEPARTMENT Provider Note   CSN: [id number] Arrival date & time: 04/17/19  1558    History   Chief Complaint Chief Complaint  Patient presents with   evaluate for TIA    HPI William Underwood is a 68 y.o. male.     HPI   Was biking 82 miles today and on mile 45 began to have left sided symptoms, difficulty riding bike in straight line, lasted about 10 minutes then improved and now feeling fine, could not steer bike in straight line.  Got off bike and sat down, when tried to stand up at one point felt like he was again falling towards the left.  Did not think he had focal weakness, no numbness, no drooping of face, no change of vision, no trouble talking.  After this occurred did have some anxiety and head tingling. No headache. No chest pain or dyspnea.   Hx of ablation for afib, pulmonary vein stenosis, had procedure/stenting at [facility] in April, had a bleed during procedure, Dr. Higinio Plan Tuesday supposed to have follow up, monitoring to have restenosis  Biking 169mile a week after being cleared 5 weeks ago, has been riding Taking eliquis and baby aspirin every morning and evening   Past Medical History:  Diagnosis Date   Benign prostatic hyperplasia without urinary obstruction 06/17/2014   BPPV (benign paroxysmal positional vertigo) 07/01/2011   Chronic hyperkalemia 05/20/2012   Overview:  Hx of for years   Concussion syndrome 05/15/2011   This seems like a moderately severe concussion with at least 1 minute loss of consciousness in 15-20 minutes of significant confusion including being unable to state his name    [place], HANDS 10/16/2009   Qualifier: Diagnosis of  By: [name], [name]     Diverticulosis 06/20/2017   Hemorrhoids, external 06/20/2017   Lumbar spondylosis 06/20/2017   Fairly significant L4-L5 facet arthropathy with effusion.  Can consider facet injections and potentially RFA down the road.     NEUROPATHY 03/01/2008   Qualifier: Diagnosis of  By: [name], [name]     Paroxysmal atrial fibrillation ([place])    Pulmonary insufficiency 03/09/2018   moderate PI noted on echo 03/09/18   Pulmonary vein stenosis    left inferior pulmonary vein stenosis   Right inguinal hernia 06/17/2014   Sinus bradycardia    Strain of gluteus medius 01/29/2017   Vertigo     Patient Active Problem List   Diagnosis Date Noted   Chronic neck pain 08/23/2018   Tinnitus 08/23/2018   Lipoma 08/23/2018   Paroxysmal atrial fibrillation ([place]) 05/25/2018   Left shoulder pain 07/14/2017   Lumbar spondylosis 06/20/2017   Diverticulosis 06/20/2017   Hemorrhoids, external 06/20/2017   Benign prostatic hyperplasia without urinary obstruction 06/17/2014   Sacroiliac joint dysfunction of left side 04/05/2014   Hip pain, left 02/23/2014   Chronic hyperkalemia 05/20/2012   Prostatitis 05/20/2012   Ankle pain 05/19/2012   Left ankle sprain 05/19/2012   BPPV (benign paroxysmal positional vertigo) 07/01/2011   Concussion syndrome 05/15/2011   DEGENERATIVE JOINT DISEASE, HANDS 10/16/2009   NEUROPATHY 03/01/2008   SYMPTOM, PAIN, ABDOMINAL, RIGHT UP QUADRANT 12/31/2006   INJURY NOS, HIP/THIGH 12/31/2006    Past Surgical History:  Procedure Laterality Date   ANKLE ARTHROSCOPY     ATRIAL FIBRILLATION ABLATION N/A 05/25/2018   Procedure: ATRIAL FIBRILLATION ABLATION;  Surgeon: Thompson Grayer, MD;  Location: [facility];  Service: Cardiovascular;  Laterality: N/A;   KNEE  ARTHROSCOPY          Home Medications    Prior to Admission medications   Medication Sig Start Date End Date Taking? Authorizing Provider  apixaban (ELIQUIS) 5 MG TABS tablet Take 5 mg by mouth 2 (two) times daily.    [provider]  diazepam (VALIUM) 5 MG tablet Take 1 tablet (5 mg total) by mouth daily as needed for anxiety. 08/23/18   [name], MD  Multiple Vitamins-Minerals  (MULTIVITAMIN WITH MINERALS) tablet Take 1 tablet by mouth daily.     [provider]  traMADol (ULTRAM) 50 MG tablet Take 1 tablet (50 mg total) by mouth every 6 (six) hours as needed for moderate pain. 08/23/18   [name], MD  Vitamin D, Ergocalciferol, 2000 units CAPS Take 2,000 Units by mouth daily.     [provider]    Family History Family History  Problem Relation Age of Onset   Hearing loss Father    Colon cancer Neg Hx     Social History Social History   Tobacco Use   Smoking status: Never Smoker   Smokeless tobacco: Never Used  Substance Use Topics   Alcohol use: No   Drug use: No     Allergies   Cefdinir and Codeine   Review of Systems Review of Systems  Constitutional: Negative for fever.  HENT: Negative for congestion.   Respiratory: Negative for cough and shortness of breath.   Cardiovascular: Negative for chest pain.  Gastrointestinal: Positive for nausea. Negative for abdominal pain, constipation, diarrhea and vomiting.  Musculoskeletal: Negative for back pain.  Skin: Negative for rash.  Neurological: Positive for dizziness. Negative for syncope, weakness, light-headedness, numbness and headaches.     Physical Exam Updated Vital Signs BP 123/84 (BP Location: Right Arm)    Pulse (!) 58    Temp 98.9 F (37.2 C)    Resp 18    Ht 5\' 7"  (1.702 m)    Wt 68 kg    SpO2 96%    BMI 23.49 kg/m   Physical Exam Vitals signs and nursing note reviewed.  Constitutional:      General: He is not in acute distress.    Appearance: He is well-developed. He is not diaphoretic.  HENT:     Head: Normocephalic and atraumatic.  Eyes:     General: No visual field deficit.    Conjunctiva/sclera: Conjunctivae normal.  Neck:     Musculoskeletal: Normal range of motion.  Cardiovascular:     Rate and Rhythm: Normal rate and regular rhythm.     Heart sounds: Normal heart sounds. No murmur. No friction rub. No gallop.   Pulmonary:      Effort: Pulmonary effort is normal. No respiratory distress.     Breath sounds: Normal breath sounds. No wheezing or rales.  Abdominal:     General: There is no distension.     Palpations: Abdomen is soft.     Tenderness: There is no abdominal tenderness. There is no guarding.  Skin:    General: Skin is warm and dry.  Neurological:     Mental Status: He is alert and oriented to person, place, and time.     GCS: GCS eye subscore is 4. GCS verbal subscore is 5. GCS motor subscore is 6.     Cranial Nerves: Cranial nerves are intact. No cranial nerve deficit, dysarthria or facial asymmetry.     Sensory: Sensation is intact. No sensory deficit.  Motor: Motor function is intact. No weakness, tremor or pronator drift.     Coordination: Coordination is intact. Coordination normal. Finger-Nose-Finger Test and Heel to [facility] Test normal.     Gait: Gait is intact.      ED Treatments / Results  Labs (all labs ordered are listed, but only abnormal results are displayed) Labs Reviewed  CBC - Abnormal; Notable for the following components:      Result Value   WBC 11.7 (*)    All other components within normal limits  DIFFERENTIAL - Abnormal; Notable for the following components:   Neutro Abs 10.0 (*)    All other components within normal limits  COMPREHENSIVE METABOLIC PANEL - Abnormal; Notable for the following components:   Sodium 134 (*)    Glucose, Bld 102 (*)    All other components within normal limits  URINALYSIS, ROUTINE W REFLEX MICROSCOPIC - Abnormal; Notable for the following components:   Ketones, ur 5 (*)    All other components within normal limits  ETHANOL  PROTIME-INR  APTT  RAPID URINE DRUG SCREEN, HOSP PERFORMED    EKG EKG Interpretation  Date/Time:  Sunday April 17 2019 16:20:31 EDT Ventricular Rate:  64 PR Interval:    QRS Duration: 89 QT Interval:  408 QTC Calculation: 421 R Axis:   81 Text Interpretation:  Sinus rhythm Borderline right axis deviation RSR'  in V1 or V2, probably normal variant No significant change since last tracing Confirmed by Gareth Morgan [phone]) on 04/17/2019 7:11:04 PM   Radiology Ct Head Wo Contrast  Result Date: 04/17/2019 CLINICAL DATA:  Transient left-sided weakness and decreased sensation. EXAM: CT HEAD WITHOUT CONTRAST TECHNIQUE: Contiguous axial images were obtained from the base of the skull through the vertex without intravenous contrast. COMPARISON:  05/11/2011 FINDINGS: Brain: No evidence of acute infarction, hemorrhage, hydrocephalus, extra-axial collection or mass lesion/mass effect. Vascular: No hyperdense vessel or unexpected calcification. Skull: Normal. Negative for fracture or focal lesion. Sinuses/Orbits: No acute finding. Other: None. IMPRESSION: No acute intracranial abnormality. Electronically Signed   By: Fidela Salisbury M.D.   On: 04/17/2019 17:39   Mr Brain Wo Contrast  Result Date: 04/17/2019 CLINICAL DATA:  Transient ischemic attack EXAM: MRI HEAD WITHOUT CONTRAST TECHNIQUE: Multiplanar, multiecho pulse sequences of the brain and surrounding structures were obtained without intravenous contrast. COMPARISON:  None. FINDINGS: BRAIN: There is no acute infarct, acute hemorrhage or extra-axial collection. The midline structures are normal. No midline shift or other mass effect. The white matter signal is normal for the patient's age. The cerebral and cerebellar volume are age-appropriate. No hydrocephalus. Single focus of chronic microhemorrhage in the right basal ganglia. No mass lesion. VASCULAR: The major intracranial arterial and venous sinus flow voids are normal. SKULL AND UPPER CERVICAL SPINE: Calvarial bone marrow signal is normal. There is no skull base mass. Visualized upper cervical spine and soft tissues are normal. SINUSES/ORBITS: No fluid levels or advanced mucosal thickening. No mastoid or middle ear effusion. The orbits are normal. IMPRESSION: Normal brain. Electronically Signed   By: Ulyses Jarred M.D.   On: 04/17/2019 21:24    Procedures Procedures (including critical care time)  Medications Ordered in ED Medications - No data to display   Initial Impression / Assessment and Plan / ED Course  I have reviewed the triage vital signs and the nursing notes.  Pertinent labs & imaging results that were available during my care of the patient were reviewed by me and considered in my medical decision making (  see chart for details).        68 year old male with history of paroxysmal atrial fibrillation post ablation, pulmonary vein stenosis with stent placement at [facility] in April, concussion who presents with 10 minute episode of dysequilibrium while riding his bike with inability to bike in a straight line and turning/falling towards the left. No injuries from the fall. No chest pain or dyspnea.  While it is possible exertion brought out prior concussion vertigo, or that symptoms were related to dehydration, have concern that his symptoms represent a TIA. Denies focal weakness and denies any continuing neurologic symptoms at this time.  CT and labs without significant abnormalities.  Discussed patient and history with Dr. Rory Percy of Neurology.  The patient is scheduled to leave tomorrow to go to the [facility] and have follow up testing for his pulmonary stenosis stents, including an ECHO, stress test, pulmonary perfusion testing and is currently on eliquis.  Given this history, Dr. Rory Percy recommends discharge with outpatient follow up with Neurology.  Ordered MRI which shows no acute abnormalities.  Patient remains symptom free in the ED. Patient discharged in stable condition with understanding of reasons to return.   Final Clinical Impressions(s) / ED Diagnoses   Final diagnoses:  Disequilibrium    ED Discharge Orders    None       Gareth Morgan, MD 04/18/19 1432

## 2019-04-17 NOTE — ED Notes (Signed)
Patient verbalizes understanding of discharge instructions. Opportunity for questioning and answers were provided. Armband removed by staff, pt discharged from ED.  

## 2019-04-18 DIAGNOSIS — Q268 Other congenital malformations of great veins: Secondary | ICD-10-CM | POA: Diagnosis not present

## 2019-04-19 DIAGNOSIS — R0789 Other chest pain: Secondary | ICD-10-CM | POA: Diagnosis not present

## 2019-04-19 DIAGNOSIS — I480 Paroxysmal atrial fibrillation: Secondary | ICD-10-CM | POA: Diagnosis not present

## 2019-04-19 DIAGNOSIS — Q268 Other congenital malformations of great veins: Secondary | ICD-10-CM | POA: Diagnosis not present

## 2019-04-19 DIAGNOSIS — I77810 Thoracic aortic ectasia: Secondary | ICD-10-CM | POA: Diagnosis not present

## 2019-04-19 DIAGNOSIS — I10 Essential (primary) hypertension: Secondary | ICD-10-CM | POA: Diagnosis not present

## 2019-04-19 DIAGNOSIS — I288 Other diseases of pulmonary vessels: Secondary | ICD-10-CM | POA: Diagnosis not present

## 2019-05-10 DIAGNOSIS — Q268 Other congenital malformations of great veins: Secondary | ICD-10-CM | POA: Diagnosis not present

## 2019-06-14 ENCOUNTER — Encounter: Payer: Self-pay | Admitting: Internal Medicine

## 2019-06-19 DIAGNOSIS — Q268 Other congenital malformations of great veins: Secondary | ICD-10-CM | POA: Diagnosis not present

## 2019-06-23 DIAGNOSIS — Q268 Other congenital malformations of great veins: Secondary | ICD-10-CM | POA: Diagnosis not present

## 2019-07-12 ENCOUNTER — Telehealth: Payer: Self-pay | Admitting: Family Medicine

## 2019-07-12 ENCOUNTER — Ambulatory Visit: Payer: Self-pay | Admitting: *Deleted

## 2019-07-12 NOTE — Telephone Encounter (Signed)
Pt called stating that he has a growth on his mid abdomen on the right side under his ribs (1 in above bellow button) and is hard and stabilized; it has gotten larger and it has gotten larger since his last physical; he says that the area is sore, and is worse with activity (running or cycling); he has a history of lipomas, but this is different; he rates his pain at 2 out of 10 at rest, and 4 after exercising; the area is sore to the touch; recommendations made per nurse triage protocol; he verbalized understanding; the pt sees Dr Dimas Chyle, [facility]; pt transferred to [facility] for scheduling.  Reason for Disposition . Age > 60 years  Answer Assessment - Initial Assessment Questions 1. LOCATION: "Where does it hurt?"      Right side under ribs 2. RADIATION: "Does the pain shoot anywhere else?" (e.g., chest, back)     Sore under ribcage and back 3. ONSET: "When did the pain begin?" (Minutes, hours or days ago)      3-4 months ago 4. SUDDEN: "Gradual or sudden onset?"     gradually 5. PATTERN "Does the pain come and go, or is it constant?"    - If constant: "Is it getting better, staying the same, or worsening?"      (Note: Constant means the pain never goes away completely; most serious pain is constant and it progresses)     - If intermittent: "How long does it last?" "Do you have pain now?"     (Note: Intermittent means the pain goes away completely between bouts)     constant 6. SEVERITY: "How bad is the pain?"  (e.g., Scale 1-10; mild, moderate, or severe)    - MILD (1-3): doesn't interfere with normal activities, abdomen soft and not tender to touch     - MODERATE (4-7): interferes with normal activities or awakens from sleep, tender to touch     - SEVERE (8-10): excruciating pain, doubled over, unable to do any normal activities       2-4 out of 10 7. RECURRENT SYMPTOM: "Have you ever had this type of abdominal pain before?" If so, ask: "When was the last time?" and "What  happened that time?"      no 8. CAUSE: "What do you think is causing the abdominal pain?"    Not sure 9. RELIEVING/AGGRAVATING FACTORS: "What makes it better or worse?" (e.g., movement, antacids, bowel movement)     Cycling and riding bike makes worse 10. OTHER SYMPTOMS: "Has there been any vomiting, diarrhea, constipation, or urine problems?"       no  Protocols used: ABDOMINAL PAIN - MALE-A-AH

## 2019-07-12 NOTE — Telephone Encounter (Signed)
I can see him on 9/17 - ok to put in a same day slot if needed.  William Underwood. [name], MD 07/12/2019 4:24 PM

## 2019-07-12 NOTE — Telephone Encounter (Signed)
See below . Thanks

## 2019-07-12 NOTE — Telephone Encounter (Signed)
Please advise 

## 2019-07-12 NOTE — Telephone Encounter (Signed)
Patient was triaged for a growth on abdomen. The pec triage nurse said he needed an appt within 24 hours but William Underwood needs to be seen in office and I wasn't able to schedule him until 07/14/19 with Dr. Jonni Sanger. Please advise if William Underwood is OK to wait to be seen until Thursday. Thank you!

## 2019-07-12 NOTE — Telephone Encounter (Signed)
Called pt to reschedule appt to Dr. Marigene Ehlers time slots. No answer, LVM.

## 2019-07-12 NOTE — Telephone Encounter (Signed)
See note

## 2019-07-13 NOTE — Telephone Encounter (Signed)
FYI

## 2019-07-14 ENCOUNTER — Encounter: Payer: Self-pay | Admitting: Family Medicine

## 2019-07-14 ENCOUNTER — Other Ambulatory Visit: Payer: Self-pay

## 2019-07-14 ENCOUNTER — Ambulatory Visit (INDEPENDENT_AMBULATORY_CARE_PROVIDER_SITE_OTHER): Payer: PPO | Admitting: Family Medicine

## 2019-07-14 ENCOUNTER — Other Ambulatory Visit: Payer: Self-pay | Admitting: Family Medicine

## 2019-07-14 ENCOUNTER — Ambulatory Visit: Payer: PPO | Admitting: Family Medicine

## 2019-07-14 VITALS — BP 108/70 | HR 60 | Temp 98.1°F | Ht 67.0 in | Wt 153.0 lb

## 2019-07-14 DIAGNOSIS — R229 Localized swelling, mass and lump, unspecified: Secondary | ICD-10-CM

## 2019-07-14 DIAGNOSIS — M2550 Pain in unspecified joint: Secondary | ICD-10-CM | POA: Diagnosis not present

## 2019-07-14 DIAGNOSIS — I288 Other diseases of pulmonary vessels: Secondary | ICD-10-CM

## 2019-07-14 DIAGNOSIS — Q268 Other congenital malformations of great veins: Secondary | ICD-10-CM | POA: Diagnosis not present

## 2019-07-14 MED ORDER — TRAMADOL HCL 50 MG PO TABS: 50.0000 mg | ORAL_TABLET | Freq: Four times a day (QID) | ORAL | 1 refills | Status: DC | PRN

## 2019-07-14 NOTE — Assessment & Plan Note (Signed)
Database with no red flags.  Will refill tramadol.

## 2019-07-14 NOTE — Assessment & Plan Note (Signed)
Continue management per cardiology. 

## 2019-07-14 NOTE — Patient Instructions (Signed)
It was very nice to see you today!  We will get an ultrasound of your abdomen.  I will refill your tramadol.  I will see you back in a few weeks for a physical.  Let me know if your symptoms worsen before then.   Take care, Dr [name]  Please try these tips to maintain a healthy lifestyle:   Eat at least 3 REAL meals and 1-2 snacks per day.  Aim for no more than 5 hours between eating.  If you eat breakfast, please do so within one hour of getting up.    Obtain twice as many fruits/vegetables as protein or carbohydrate foods for both lunch and dinner. (Half of each meal should be fruits/vegetables, one quarter protein, and one quarter starchy carbs)   Cut down on sweet beverages. This includes juice, soda, and sweet tea.    Exercise at least 150 minutes every week.

## 2019-07-14 NOTE — Progress Notes (Signed)
   Chief Complaint:  William Underwood is a 68 y.o. male who presents for same day appointment with a chief complaint of skin lump.   Assessment/Plan:  Skin Lump Likely lipoma.  Will check ultrasound.  May need referral to surgery.  Arthralgia Database with no red flags.  Will refill tramadol.   Pulmonary vein stenosis s/p stenting 2020 Continue management per cardiology.      Subjective:  HPI:  Skin lump Present for several months.  Located on right side of abdomen.  Seems to be increasing in size.  It is hard to touch.  Painful with palpation.  Worsened pain with exercise.  No obvious trauma.  No obvious precipitating events.  No treatments tried.  No other obvious alleviating or aggravating factors.   Osteoarthritis Uses tramadol as needed for neck and joint pain.  Uses maybe once per week.  Tolerates well.  Pulmonary vein stenosis Has underwent stenting at [facility].  Is following with them every 3 months.  Has decreased exercise capacity but is still able to ride and cycle however not nearly to the level he would like.  ROS: Per HPI  PMH: He reports that he has never smoked. He has never used smokeless tobacco. He reports that he does not drink alcohol or use drugs.      Objective:  Physical Exam: BP 108/70   Pulse 60   Temp 98.1 F (36.7 C)   Ht 5\' 7"  (1.702 m)   Wt 153 lb (69.4 kg)   SpO2 97%   BMI 23.96 kg/m   Gen: NAD, resting comfortably CV: Regular rate and rhythm with no murmurs appreciated Pulm: Normal work of breathing, clear to auscultation bilaterally with no crackles, wheezes, or rhonchi Skin: Approximately 5 mm hard nodule on right abdomen.  Mobile.     [name]. [name], MD 07/14/2019 12:35 PM

## 2019-08-10 NOTE — Telephone Encounter (Signed)
Skeet Latch, MD  You; Alvina Filbert B, LPN 12 minutes ago ([id number] PM)   Dr. Margaretann Loveless won't be back until Thanksgiving. Given his request for an imaging specialist I'm sure he would like Dr. [name]. If she isn't available Dr. Davina Poke may have sooner availability. I don't think I personally have an opening until the end of December.    Message text

## 2019-08-11 ENCOUNTER — Ambulatory Visit
Admission: RE | Admit: 2019-08-11 | Discharge: 2019-08-11 | Disposition: A | Discharge status: Home / Self Care | Payer: PPO | Source: Ambulatory Visit | Attending: Family Medicine | Admitting: Family Medicine

## 2019-08-11 DIAGNOSIS — R1909 Other intra-abdominal and pelvic swelling, mass and lump: Secondary | ICD-10-CM | POA: Diagnosis not present

## 2019-08-11 DIAGNOSIS — R229 Localized swelling, mass and lump, unspecified: Secondary | ICD-10-CM

## 2019-08-11 NOTE — Progress Notes (Signed)
Please inform patient of the following:  [place] confirms lipoma. Can refer to surgery if he wants it taken out, otherwise do not need to do any further testing.  William Underwood. [name], MD 08/11/2019 3:36 PM

## 2019-08-26 ENCOUNTER — Ambulatory Visit (INDEPENDENT_AMBULATORY_CARE_PROVIDER_SITE_OTHER): Payer: PPO | Admitting: Family Medicine

## 2019-08-26 ENCOUNTER — Encounter: Payer: Self-pay | Admitting: Family Medicine

## 2019-08-26 ENCOUNTER — Other Ambulatory Visit: Payer: Self-pay

## 2019-08-26 VITALS — BP 110/64 | HR 49 | Temp 97.9°F | Ht 67.0 in | Wt 156.0 lb

## 2019-08-26 DIAGNOSIS — Z13220 Encounter for screening for lipoid disorders: Secondary | ICD-10-CM | POA: Diagnosis not present

## 2019-08-26 DIAGNOSIS — Z0001 Encounter for general adult medical examination with abnormal findings: Secondary | ICD-10-CM

## 2019-08-26 DIAGNOSIS — Z125 Encounter for screening for malignant neoplasm of prostate: Secondary | ICD-10-CM

## 2019-08-26 DIAGNOSIS — Q268 Other congenital malformations of great veins: Secondary | ICD-10-CM | POA: Diagnosis not present

## 2019-08-26 DIAGNOSIS — H9319 Tinnitus, unspecified ear: Secondary | ICD-10-CM

## 2019-08-26 DIAGNOSIS — I480 Paroxysmal atrial fibrillation: Secondary | ICD-10-CM | POA: Diagnosis not present

## 2019-08-26 DIAGNOSIS — G8929 Other chronic pain: Secondary | ICD-10-CM

## 2019-08-26 DIAGNOSIS — M542 Cervicalgia: Secondary | ICD-10-CM

## 2019-08-26 LAB — LIPID PANEL
Cholesterol: 170 mg/dL (ref 0–200)
HDL: 60.5 mg/dL (ref >39.00)
LDL Cholesterol: 100 mg/dL — ABNORMAL HIGH (ref 0–99)
NonHDL: 109.42
Total CHOL/HDL Ratio: 3
Triglycerides: 49 mg/dL (ref 0.0–149.0)
VLDL: 9.8 mg/dL (ref 0.0–40.0)

## 2019-08-26 LAB — PSA: PSA: 1.93 ng/mL (ref 0.10–4.00)

## 2019-08-26 NOTE — Patient Instructions (Addendum)
It was very nice to see you today!  Please keep up the good work with your exercise.   We will check blood work today.  Come back in 1 year for your next physical, or sooner if needed.   Take care, Dr [name]  Please try these tips to maintain a healthy lifestyle:   Eat at least 3 REAL meals and 1-2 snacks per day.  Aim for no more than 5 hours between eating.  If you eat breakfast, please do so within one hour of getting up.    Obtain twice as many fruits/vegetables as protein or carbohydrate foods for both lunch and dinner. (Half of each meal should be fruits/vegetables, one quarter protein, and one quarter starchy carbs)   Cut down on sweet beverages. This includes juice, soda, and sweet tea.    Exercise at least 150 minutes every week.    Preventive Care 69 Years and Older, Male Preventive care refers to lifestyle choices and visits with your health care provider that can promote health and wellness. This includes:  A yearly physical exam. This is also called an annual well check.  Regular dental and eye exams.  Immunizations.  Screening for certain conditions.  Healthy lifestyle choices, such as diet and exercise. What can I expect for my preventive care visit? Physical exam Your health care provider will check:  Height and weight. These may be used to calculate body mass index (BMI), which is a measurement that tells if you are at a healthy weight.  Heart rate and blood pressure.  Your skin for abnormal spots. Counseling Your health care provider may ask you questions about:  Alcohol, tobacco, and drug use.  Emotional well-being.  Home and relationship well-being.  Sexual activity.  Eating habits.  History of falls.  Memory and ability to understand (cognition).  Work and work Statistician. What immunizations do I need?  Influenza (flu) vaccine  This is recommended every year. Tetanus, diphtheria, and pertussis (Tdap) vaccine  You may need a  Td booster every 10 years. Varicella (chickenpox) vaccine  You may need this vaccine if you have not already been vaccinated. Zoster (shingles) vaccine  You may need this after age 36. Pneumococcal conjugate (PCV13) vaccine  One dose is recommended after age 19. Pneumococcal polysaccharide (PPSV23) vaccine  One dose is recommended after age 30. Measles, mumps, and rubella (MMR) vaccine  You may need at least one dose of MMR if you were born in 1957 or later. You may also need a second dose. Meningococcal conjugate (MenACWY) vaccine  You may need this if you have certain conditions. Hepatitis A vaccine  You may need this if you have certain conditions or if you travel or work in places where you may be exposed to hepatitis A. Hepatitis B vaccine  You may need this if you have certain conditions or if you travel or work in places where you may be exposed to hepatitis B. Haemophilus influenzae type b (Hib) vaccine  You may need this if you have certain conditions. You may receive vaccines as individual doses or as more than one vaccine together in one shot (combination vaccines). Talk with your health care provider about the risks and benefits of combination vaccines. What tests do I need? Blood tests  Lipid and cholesterol levels. These may be checked every 5 years, or more frequently depending on your overall health.  Hepatitis C test.  Hepatitis B test. Screening  Lung cancer screening. You may have this screening every year  starting at age 22 if you have a 30-pack-year history of smoking and currently smoke or have quit within the past 15 years.  Colorectal cancer screening. All adults should have this screening starting at age 75 and continuing until age 88. Your health care provider may recommend screening at age 29 if you are at increased risk. You will have tests every 1-10 years, depending on your results and the type of screening test.  Prostate cancer screening.  Recommendations will vary depending on your family history and other risks.  Diabetes screening. This is done by checking your blood sugar (glucose) after you have not eaten for a while (fasting). You may have this done every 1-3 years.  Abdominal aortic aneurysm (AAA) screening. You may need this if you are a current or former smoker.  Sexually transmitted disease (STD) testing. Follow these instructions at home: Eating and drinking  Eat a diet that includes fresh fruits and vegetables, whole grains, lean protein, and low-fat dairy products. Limit your intake of foods with high amounts of sugar, saturated fats, and salt.  Take vitamin and mineral supplements as recommended by your health care provider.  Do not drink alcohol if your health care provider tells you not to drink.  If you drink alcohol: ? Limit how much you have to 0-2 drinks a day. ? Be aware of how much alcohol is in your drink. In the [place] one drink equals one 12 oz bottle of beer (355 mL), one 5 oz glass of wine (148 mL), or one 1 oz glass of hard liquor (44 mL). Lifestyle  Take daily care of your teeth and gums.  Stay active. Exercise for at least 30 minutes on 5 or more days each week.  Do not use any products that contain nicotine or tobacco, such as cigarettes, e-cigarettes, and chewing tobacco. If you need help quitting, ask your health care provider.  If you are sexually active, practice safe sex. Use a condom or other form of protection to prevent STIs (sexually transmitted infections).  Talk with your health care provider about taking a low-dose aspirin or statin. What's next?  Visit your health care provider once a year for a well check visit.  Ask your health care provider how often you should have your eyes and teeth checked.  Stay up to date on all vaccines. This information is not intended to replace advice given to you by your health care provider. Make sure you discuss any questions you have with  your health care provider. Document Released: 11/09/2015 Document Revised: 10/07/2018 Document Reviewed: 10/07/2018 Elsevier Patient Education  2020 [employer].

## 2019-08-26 NOTE — Progress Notes (Signed)
Chief Complaint:  William Underwood is a 68 y.o. male who presents today for his annual comprehensive physical exam and subsequent medicare annual wellness visit.    Assessment/Plan:  Pulmonary vein stenosis s/p stenting 2020 Continue management per cardiology.  Encourage patient to stay active.  Tinnitus Continue Valium as needed for sleep.  Chronic neck pain Stable.  Continue tramadol as needed.  Paroxysmal atrial fibrillation (HCC) Continue management per cardiology.  Preventative Healthcare: Check PSA and lipid panel.  Due for colonoscopy 2027.  Otherwise up-to-date on vaccines and screenings.  Patient Counseling(The following topics were reviewed and/or handout was given):  -Nutrition: Stressed importance of moderation in sodium/caffeine intake, saturated fat and cholesterol, caloric balance, sufficient intake of fresh fruits, vegetables, and fiber.  -Stressed the importance of regular exercise.   -Substance Abuse: Discussed cessation/primary prevention of tobacco, alcohol, or other drug use; driving or other dangerous activities under the influence; availability of treatment for abuse.   -Injury prevention: Discussed safety belts, safety helmets, smoke detector, smoking near bedding or upholstery.   -Sexuality: Discussed sexually transmitted diseases, partner selection, use of condoms, avoidance of unintended pregnancy and contraceptive alternatives.   -Dental health: Discussed importance of regular tooth brushing, flossing, and dental visits.  -Health maintenance and immunizations reviewed. Please refer to Health maintenance section.  During the course of the visit the patient was educated and counseled about appropriate screening and preventive services including:        Fall prevention   Nutrition Physical Activity Weight Management Cognition  Return to care in 1 year for next preventative visit.     Subjective:  HPI:  Health Risk Assessment: Patient considers his  overall health to be good. He has no difficulty performing the following: . Preparing food and eating . Bathing  . Getting dressed . Using the toilet . Shopping . Managing Finances . Moving around from place to place  He has not had any falls within the past year.   Depression screen [facility] 2/9 07/14/2019  Decreased Interest 0  Down, Depressed, Hopeless 0  PHQ - 2 Score 0  Altered sleeping 1  Tired, decreased energy 2  Change in appetite 2  Feeling bad or failure about yourself  0  Trouble concentrating 1  Moving slowly or fidgety/restless 0  Suicidal thoughts 0  PHQ-9 Score 6  Difficult doing work/chores Not difficult at all   Lifestyle Factors: Diet: Balanced. Plenty of fruits and vegetables.  Exercise: Rides bike 150 miles per week and runs regularly.   Patient Care Team: [name], MD as PCP - General (Family Medicine)   His chronic medical conditions are outlined below:  # Atrial Fibrillation s/p ablation, complicated by pulmonary vein stenosis - Follows with Cardiology - anticoagulated on xarelto   # Chronic Neck Pain - Uses tramadol as needed  # Tinnitus  - Uses valium as needed for sleep  ROS: Per HPI, otherwise a complete review of systems was negative.   PMH:  The following were reviewed and entered/updated in epic: Past Medical History:  Diagnosis Date  . Benign prostatic hyperplasia without urinary obstruction 06/17/2014  . BPPV (benign paroxysmal positional vertigo) 07/01/2011  . Chronic hyperkalemia 05/20/2012   Overview:  Hx of for years  . Concussion syndrome 05/15/2011   This seems like a moderately severe concussion with at least 1 minute loss of consciousness in 15-20 minutes of significant confusion including being unable to state his name   . DEGENERATIVE JOINT DISEASE, HANDS 10/16/2009  Qualifier: Diagnosis of  By: [name], [name]    . Diverticulosis 06/20/2017  . Hemorrhoids, external 06/20/2017  . Lumbar spondylosis 06/20/2017   Fairly  significant L4-L5 facet arthropathy with effusion.  Can consider facet injections and potentially RFA down the road.  Marland Kitchen NEUROPATHY 03/01/2008   Qualifier: Diagnosis of  By: [name], [name]    . Paroxysmal atrial fibrillation (HCC)   . Pulmonary insufficiency 03/09/2018   moderate PI noted on echo 03/09/18  . Pulmonary vein stenosis    left inferior pulmonary vein stenosis  . Right inguinal hernia 06/17/2014  . Sinus bradycardia   . Strain of gluteus medius 01/29/2017  . Vertigo    Patient Active Problem List   Diagnosis Date Noted  . Arthralgia 07/14/2019  . Pulmonary vein stenosis s/p stenting 2020 01/02/2019  . Chronic neck pain 08/23/2018  . Tinnitus 08/23/2018  . Lipoma 08/23/2018  . Paroxysmal atrial fibrillation ([place]) 05/25/2018  . Diverticulosis 06/20/2017  . Hemorrhoids, external 06/20/2017  . Benign prostatic hyperplasia without urinary obstruction 06/17/2014  . BPPV (benign paroxysmal positional vertigo) 07/01/2011  . DEGENERATIVE JOINT DISEASE, HANDS 10/16/2009  . NEUROPATHY 03/01/2008   Past Surgical History:  Procedure Laterality Date  . ANKLE ARTHROSCOPY    . ATRIAL FIBRILLATION ABLATION N/A 05/25/2018   Procedure: ATRIAL FIBRILLATION ABLATION;  Surgeon: Thompson Grayer, MD;  Location: [facility];  Service: Cardiovascular;  Laterality: N/A;  . KNEE ARTHROSCOPY      Family History  Problem Relation Age of Onset  . Hearing loss Father   . Colon cancer Neg Hx     Medications- reviewed and updated Current Outpatient Medications  Medication Sig Dispense Refill  . BABY ASPIRIN PO Take 81 mg by mouth daily.    . diazepam (VALIUM) 5 MG tablet Take 1 tablet (5 mg total) by mouth daily as needed for anxiety. 30 tablet 1  . Multiple Vitamins-Minerals (MULTIVITAMIN WITH MINERALS) tablet Take 1 tablet by mouth daily.     . rivaroxaban (XARELTO) 20 MG TABS tablet Take by mouth.    . traMADol (ULTRAM) 50 MG tablet Take 1 tablet (50 mg total) by mouth every 6 (six) hours  as needed for moderate pain. 30 tablet 1  . Vitamin D, Ergocalciferol, 2000 units CAPS Take 2,000 Units by mouth daily.      No current facility-administered medications for this visit.     Allergies-reviewed and updated Allergies  Allergen Reactions  . Cefdinir Palpitations and Rash  . Codeine Other (See Comments)    Sensitive, agitation    Social History   Socioeconomic History  . Marital status: Married    Spouse name: Not on file  . Number of children: Not on file  . Years of education: Not on file  . Highest education level: Not on file  Occupational History  . Not on file  Social Needs  . Financial resource strain: Not on file  . Food insecurity    Worry: Not on file    Inability: Not on file  . Transportation needs    Medical: Not on file    Non-medical: Not on file  Tobacco Use  . Smoking status: Never Smoker  . Smokeless tobacco: Never Used  Substance and Sexual Activity  . Alcohol use: No  . Drug use: No  . Sexual activity: Not on file  Lifestyle  . Physical activity    Days per week: Not on file    Minutes per session: Not on file  .  Stress: Not on file  Relationships  . Social Herbalist on phone: Not on file    Gets together: Not on file    Attends religious service: Not on file    Active member of club or organization: Not on file    Attends meetings of clubs or organizations: Not on file    Relationship status: Not on file  Other Topics Concern  . Not on file  Social History Narrative  . Not on file        Objective:  Physical Exam: BP 110/64   Pulse (!) 49   Temp 97.9 F (36.6 C)   Ht 5\' 7"  (1.702 m)   Wt 156 lb (70.8 kg)   SpO2 95%   BMI 24.43 kg/m   Body mass index is 24.43 kg/m. Wt Readings from Last 3 Encounters:  08/26/19 156 lb (70.8 kg)  07/14/19 153 lb (69.4 kg)  04/17/19 150 lb (68 kg)   Gen: NAD, resting comfortably HEENT: TMs normal bilaterally. OP clear. No thyromegaly noted.  CV: RRR with no murmurs  appreciated Pulm: NWOB, CTAB with no crackles, wheezes, or rhonchi GI: Normal bowel sounds present. Soft, Nontender, Nondistended. MSK: no edema, cyanosis, or clubbing noted Skin: warm, dry Neuro: CN2-12 grossly intact. Strength 5/5 in upper and lower extremities. Reflexes symmetric and intact bilaterally. Normal minicog.  Psych: Normal affect and thought content     Caleb M. [name], MD 08/26/2019 9:55 AM

## 2019-08-26 NOTE — Assessment & Plan Note (Signed)
Continue management per cardiology.  Encourage patient to stay active.

## 2019-08-26 NOTE — Assessment & Plan Note (Signed)
Stable.  Continue tramadol as needed

## 2019-08-26 NOTE — Assessment & Plan Note (Signed)
Continue management per cardiology. 

## 2019-08-26 NOTE — Assessment & Plan Note (Signed)
Continue Valium as needed for sleep.

## 2019-08-29 NOTE — Progress Notes (Signed)
Please inform patient of the following:  His "bad" cholesterol increased slightly but all of his other blood work is all NORMAL.  Would like for him to keep up the good work and we can recheck in a year or so.  William Underwood. [name], MD 08/29/2019 10:47 AM

## 2019-10-06 ENCOUNTER — Encounter: Payer: Self-pay | Admitting: Internal Medicine

## 2019-10-06 ENCOUNTER — Ambulatory Visit: Payer: PPO | Admitting: Internal Medicine

## 2019-10-06 ENCOUNTER — Other Ambulatory Visit: Payer: Self-pay

## 2019-10-06 VITALS — BP 132/82 | HR 64 | Ht 67.0 in | Wt 154.4 lb

## 2019-10-06 DIAGNOSIS — Z9889 Other specified postprocedural states: Secondary | ICD-10-CM

## 2019-10-06 DIAGNOSIS — Q268 Other congenital malformations of great veins: Secondary | ICD-10-CM

## 2019-10-06 DIAGNOSIS — R0600 Dyspnea, unspecified: Secondary | ICD-10-CM

## 2019-10-06 DIAGNOSIS — Z8679 Personal history of other diseases of the circulatory system: Secondary | ICD-10-CM

## 2019-10-06 DIAGNOSIS — R0609 Other forms of dyspnea: Secondary | ICD-10-CM

## 2019-10-06 NOTE — Patient Instructions (Signed)
Medication Instructions: NO CHANGES  If you need a refill on your cardiac medications before your next appointment, please call your pharmacy*  Lab Work: NONE AT THIS TIME  Testing/Procedures: NONE  Follow-Up: At [facility], you and your health needs are our priority.  As part of our continuing mission to provide you with exceptional heart care, we have created designated Provider Care Teams.  These Care Teams include your primary Cardiologist (physician) and Advanced Practice Providers (APPs -  Physician Assistants and Nurse Practitioners) who all work together to provide you with the care you need, when you need it.  Your next appointment:   3 month(s)  The format for your next appointment:   In Person  Provider:   Cherlynn Kaiser, MD

## 2019-10-06 NOTE — Progress Notes (Signed)
Cardiology Office Note:    Date:  10/06/2019   ID:  William Underwood, DOB 1951-06-10, MRN [id number]  PCP:  [name], MD  Cardiologist:  No primary care provider on file.  Electrophysiologist:  None   Referring MD: [name], MD   Chief Complaint: f/u pulmonary vein stenting for pulmonary vein stenosis after afib ablation.   History of Present Illness:    William Underwood is a 68 y.o. male with a hx of atrial fibrillation status post pulmonary vein isolation.  Probable symptomatic pulmonary vein stenosis afterward now status post pulmonary vein stenting at the [facility] clinic in April 2020.  CURRENT COURSE: Patient brings with him a detailed summary of his interval history which I will have scanned into the record.  Briefly on February 16, 2019 he underwent pulmonary angiography for total occlusion of the superior branch of the left lower pulmonary vein and severe proximal stenosis of the inferior branch of the left lower pulmonary vein.  He underwent successful bifurcation stenting of the superior and inferior branches of the left lower pulmonary vein.  However post procedurally he experienced bronchial bleeding and hemoptysis requiring bronchoscopy an additional 6 days of hospitalization with hypoxia and anemia.  He was recommended to continue 1 year of anticoagulation with Xarelto and aspirin and immediate postprocedural CT demonstrated patent stents with adequate flow.  He had 25-month post intervention follow-up which demonstrated patent stent to the inferior branch of the left lower pulmonary vein.  However the superior branch of the left lower pulmonary vein had reoccluded.  Upon speaking to his interventional cardiologist at [facility] clinic, she shared with the patient that this was a possibility preprocedurally.  He shares with me detail information about his lung perfusion studies as well as metabolic stress testing.  I shared with the patient that while these data points  are useful to follow him, it is most important to gauge how he feels.  He tells me that overall his exercise tolerance has decreased.  We discussed that perhaps he felt some symptoms with his atrial fibrillation the patient feels that his symptoms worsened after pulmonary vein stenosis.  It is very difficult to tell if his exercise intolerance is directly correlated to pulmonary vein stenosis given that after stenting he did not notice a measurable difference.  PAST HISTORY: Patient tells me that prior to developing atrial fibrillation and his first hospital visit in April 2019, he was a very active endurance athlete, running a marathon once a month and biking 100 miles without difficulty.  He began to notice with paroxysmal atrial fibrillation that his exercise capacity decreased.  He tells me that when not in atrial fibrillation he felt his exercise capacity was optimal, but would notice a significant difference while in atrial fibrillation.  Given a very low resting heart rate the patient tells me that he opted for atrial fibrillation ablation as management strategy for symptomatic paroxysmal atrial fibrillation.  This was performed May 18, 2018.  In late August and early September 2019 he began to notice a dry cough and decreased exercise tolerance with shortness of breath.  Over the next several months his evaluation revealed left lower pulmonary vein stenosis on CT with possible area of associated mild pulmonary hemorrhage from pulmonary venous infarct.  He had multiple discussions with his cardiologist Dr. Rayann Heman regarding the management of symptomatic pulmonary vein stenosis.  Due to concerns of lifestyle limitation, he was offered a second opinion at a referral center and the patient  chose to visit [facility] clinic.  As part of his work-up he had treadmill stress test and an oxygen consumption treadmill stress test both of which revealed adequate functional capacity.  He underwent a VQ scan at [facility]  clinic demonstrating possible low perfusion in the left lung with no apparent discordant findings pre-or post exercise.  He discussed these findings with Dr. Higinio Plan in [facility]/interventional cardiology and a plan for discussion of pulmonary vein stenting was had.  The patient brings several questions with him today.  First he requests that I service his primary cardiologist which I am happy to do going forward.  Second he is asked that I contact Dr. Higinio Plan at the [facility] clinic and coordinate any further testing which I have done, please see details in the impression and plan.  I have informed the patient that testing and imaging protocols will be performed at my clinical discretion, however we are happy to assist in any interval imaging follow-up as requested by his physicians at the [facility] clinic.  The patient also asked today about the risk of serial CT scans.  We reviewed recommendations about iatrogenic radiation today.  Past Medical History:  Diagnosis Date   Benign prostatic hyperplasia without urinary obstruction 06/17/2014   BPPV (benign paroxysmal positional vertigo) 07/01/2011   Chronic hyperkalemia 05/20/2012   Overview:  Hx of for years   Concussion syndrome 05/15/2011   This seems like a moderately severe concussion with at least 1 minute loss of consciousness in 15-20 minutes of significant confusion including being unable to state his name    DEGENERATIVE JOINT DISEASE, HANDS 10/16/2009   Qualifier: Diagnosis of  By: [name], [name]     Diverticulosis 06/20/2017   Hemorrhoids, external 06/20/2017   Lumbar spondylosis 06/20/2017   Fairly significant L4-L5 facet arthropathy with effusion.  Can consider facet injections and potentially RFA down the road.   NEUROPATHY 03/01/2008   Qualifier: Diagnosis of  By: [name], [name]     Paroxysmal atrial fibrillation ([place])    Pulmonary insufficiency 03/09/2018   moderate PI noted on echo 03/09/18   Pulmonary vein stenosis    left  inferior pulmonary vein stenosis   Right inguinal hernia 06/17/2014   Sinus bradycardia    Strain of gluteus medius 01/29/2017   Vertigo     Past Surgical History:  Procedure Laterality Date   ANKLE ARTHROSCOPY     ATRIAL FIBRILLATION ABLATION N/A 05/25/2018   Procedure: ATRIAL FIBRILLATION ABLATION;  Surgeon: Thompson Grayer, MD;  Location: [place];  Service: Cardiovascular;  Laterality: N/A;   KNEE ARTHROSCOPY      Current Medications: Current Meds  Medication Sig   BABY ASPIRIN PO Take 81 mg by mouth daily.   diazepam (VALIUM) 5 MG tablet Take 1 tablet (5 mg total) by mouth daily as needed for anxiety.   Multiple Vitamins-Minerals (MULTIVITAMIN WITH MINERALS) tablet Take 1 tablet by mouth daily.    rivaroxaban (XARELTO) 20 MG TABS tablet Take by mouth.   traMADol (ULTRAM) 50 MG tablet Take 1 tablet (50 mg total) by mouth every 6 (six) hours as needed for moderate pain.   Vitamin D, Ergocalciferol, 2000 units CAPS Take 2,000 Units by mouth daily.      Allergies:   Cefdinir and Codeine   Social History   Socioeconomic History   Marital status: Married    Spouse name: Not on file   Number of children: Not on file   Years of education: Not on file  Highest education level: Not on file  Occupational History   Not on file  Tobacco Use   Smoking status: Never Smoker   Smokeless tobacco: Never Used  Substance and Sexual Activity   Alcohol use: No   Drug use: No   Sexual activity: Not on file  Other Topics Concern   Not on file  Social History Narrative   Not on file   Social Determinants of Health   Financial Resource Strain:    Difficulty of Paying Living Expenses: Not on file  Food Insecurity:    Worried About [place] in the Last Year: Not on file   Ran Out of Food in the Last Year: Not on file  Transportation Needs:    Lack of Transportation (Medical): Not on file   Lack of Transportation (Non-Medical): Not on  file  Physical Activity:    Days of Exercise per Week: Not on file   Minutes of Exercise per Session: Not on file  Stress:    Feeling of Stress : Not on file  Social Connections:    Frequency of Communication with Friends and Family: Not on file   Frequency of Social Gatherings with Friends and Family: Not on file   Attends Religious Services: Not on file   Active Member of Clubs or Organizations: Not on file   Attends Archivist Meetings: Not on file   Marital Status: Not on file     Family History: The patient's family history includes Hearing loss in his father. There is no history of Colon cancer.  ROS:   Please see the history of present illness.    All other systems reviewed and are negative.  EKGs/Labs/Other Studies Reviewed:    The following studies were reviewed today:  EKG: Normal sinus rhythm  Recent Labs: 04/17/2019: ALT 24; BUN 19; Creatinine, Ser 0.92; Hemoglobin 14.7; Platelets 196; Potassium 4.2; Sodium 134  Recent Lipid Panel    Component Value Date/Time   CHOL 170 08/26/2019 0947   TRIG 49.0 08/26/2019 0947   HDL 60.50 08/26/2019 0947   CHOLHDL 3 08/26/2019 0947   VLDL 9.8 08/26/2019 0947   LDLCALC 100 (H) 08/26/2019 0947    Physical Exam:    VS:  BP 132/82    Pulse 64    Ht 5\' 7"  (1.702 m)    Wt 154 lb 6.4 oz (70 kg)    SpO2 99%    BMI 24.18 kg/m     Wt Readings from Last 5 Encounters:  10/06/19 154 lb 6.4 oz (70 kg)  08/26/19 156 lb (70.8 kg)  07/14/19 153 lb (69.4 kg)  04/17/19 150 lb (68 kg)  02/04/19 145 lb (65.8 kg)     Constitutional: No acute distress Eyes: sclera non-icteric, normal conjunctiva and lids ENMT: normal dentition, moist mucous membranes Cardiovascular: regular rhythm, normal rate, no murmurs. S1 and S2 normal. Radial pulses normal bilaterally. No jugular venous distention.  Respiratory: clear to auscultation bilaterally GI : normal bowel sounds, soft and nontender. No distention.   MSK: extremities  warm, well perfused. No edema.  NEURO: grossly nonfocal exam, moves all extremities. PSYCH: alert and oriented x 3, normal mood and affect.   ASSESSMENT:    1. Pulmonary vein stenosis   2. S/P ablation of atrial fibrillation   3. DOE (dyspnea on exertion)    PLAN:    Pulmonary vein stenosis-I am happy to provide general cardiovascular care for Mr. Krupa in conjunction with electrophysiologic care from my partner  Dr. Rayann Heman.  Any ablation related questions, or questions related to pulmonary vein stenting should be directed to his electrophysiologist Dr. Rayann Heman and his interventional cardiologist at the [facility] clinic Dr. Higinio Plan.   I spent approximately 20 minutes contacting the [facility] clinic.  I was placed in touch with Dr. Mervin Kung nurse practitioner who attempted to answer Mr. Baldivia questions which I was asking on his behalf.  Dr. Higinio Plan was able to chat very briefly about her recommendations.  Per her office I was informed that there preferred method of surveillance imaging includes alternating CT and MRI every 12 months for surveillance.  Of note the patient's last imaging studies were performed in June 2020, he would not be due for cross-sectional imaging again per her office until June 2021.  CT of course would be used for anatomic dimensions of previous stenting and any associated pulmonary parenchymal impact.  MRI could be used to quantitate lung flow (phase contrast imaging of the LPA and RPA). I have shared my concern with Dr. Mervin Kung nurse practitioner that MR imaging will not allow me to answer the patient's question which is exact anatomic dimensions of the pulmonary vein stents.  This would be due to metallic artifact and inferior spatial resolution as compared to CT.  She agrees that exact dimensions on MRI would not be able to be performed to answer the patient specific questions, however proximal and distal dimensions of the pulmonary veins can be obtained for patency  of pulmonary vein.  I have discussed with the patient that attempting to compare anatomic measurements of MRI and CT will yield dissatisfying results given that the spatial resolution of CT is superior to that of MRI, and measurements may be variable without actual anatomic or clinical change.  In addition to cross-sectional imaging she recommends nuclear medicine lung perfusion to quantitate pulmonary perfusion.  And finally cardiopulmonary exercise testing.  Outside of the multidisciplinary resources of a tertiary care academic [facility] such as the [facility] clinic, I will certainly attempt to obtain the highest quality cardiovascular imaging we can get for the patient.  I will communicate these recommendations to the patient and will make plans for follow-up as needed.  The patient could should continue to remain active.  Outside of worsening exercise tolerance and new symptoms of chest discomfort, shortness of breath, or palpitations, no further action is required at this time.  The patient and I had a very pleasant discussion regarding recommendations for serial imaging and techniques with which we will accomplish our shared goals.  He demonstrated understanding of our conversation and was appreciative for the care.  I spent approximately 40 minutes with the patient reviewing his detailed summary of his care at [facility] clinic and recommendations going forward.  I spent an additional 20 minutes contacting his [facility] clinic team by phone, and discussing patient's case as well as recommendations and coordination of care. Cherlynn Kaiser, MD [place]   [facility]   Medication Adjustments/Labs and Tests Ordered: Current medicines are reviewed at length with the patient today.  Concerns regarding medicines are outlined above.  Orders Placed This Encounter  Procedures   EKG 12-Lead   No orders of the defined types were placed in this encounter.   Patient Instructions  Medication  Instructions: NO CHANGES  If you need a refill on your cardiac medications before your next appointment, please call your pharmacy*  Lab Work: NONE AT THIS TIME  Testing/Procedures: NONE  Follow-Up: At [facility], you and your health needs  are our priority.  As part of our continuing mission to provide you with exceptional heart care, we have created designated Provider Care Teams.  These Care Teams include your primary Cardiologist (physician) and Advanced Practice Providers (APPs -  Physician Assistants and Nurse Practitioners) who all work together to provide you with the care you need, when you need it.  Your next appointment:   3 month(s)  The format for your next appointment:   In Person  Provider:   Cherlynn Kaiser, MD

## 2019-10-10 ENCOUNTER — Other Ambulatory Visit (INDEPENDENT_AMBULATORY_CARE_PROVIDER_SITE_OTHER): Payer: PPO

## 2019-10-10 DIAGNOSIS — Q268 Other congenital malformations of great veins: Secondary | ICD-10-CM | POA: Diagnosis not present

## 2019-10-10 DIAGNOSIS — Z9889 Other specified postprocedural states: Secondary | ICD-10-CM

## 2019-10-10 DIAGNOSIS — Z8679 Personal history of other diseases of the circulatory system: Secondary | ICD-10-CM | POA: Diagnosis not present

## 2019-10-10 DIAGNOSIS — R0602 Shortness of breath: Secondary | ICD-10-CM | POA: Diagnosis not present

## 2019-10-10 DIAGNOSIS — I480 Paroxysmal atrial fibrillation: Secondary | ICD-10-CM | POA: Diagnosis not present

## 2019-10-17 DIAGNOSIS — Z20828 Contact with and (suspected) exposure to other viral communicable diseases: Secondary | ICD-10-CM | POA: Diagnosis not present

## 2019-10-17 DIAGNOSIS — Z03818 Encounter for observation for suspected exposure to other biological agents ruled out: Secondary | ICD-10-CM | POA: Diagnosis not present

## 2019-11-01 ENCOUNTER — Telehealth: Payer: Self-pay | Admitting: *Deleted

## 2019-11-01 NOTE — Telephone Encounter (Signed)
[name], MD  Raiford Simmonds, RN; Thompson Grayer, MD  Ivin Booty,   Please let Mr. Pinuelas know that I called Dr. Mervin Kung office at the [facility]. She recommended surveillance imaging and testing every 12 months, which would mean he would be due for the battery of tests he would like for [place] to perform in June 2021. Please let me know if he has questions about that, he and I can discuss further at our follow up visit. EP follow up per Dr. Jackalyn Lombard office.   Happy to answer any other questions, and if he would like we can set up a virtual visit sooner if he has any additional questions that have come up since our visit.   Thanks,  Smitty Cords

## 2019-11-01 NOTE — Telephone Encounter (Signed)
Left message for patient return call  About  Information dr Margaretann Loveless received.

## 2019-11-02 NOTE — Telephone Encounter (Signed)
Follow up ° ° °Patient is returning call per previous message. Please call. °

## 2019-11-08 NOTE — Telephone Encounter (Signed)
Dr Margaretann Loveless, spoke to patient through [facility] as well as calling the patient.

## 2019-11-22 ENCOUNTER — Ambulatory Visit: Payer: PPO

## 2019-12-01 ENCOUNTER — Ambulatory Visit: Payer: PPO | Attending: Internal Medicine

## 2019-12-13 ENCOUNTER — Ambulatory Visit: Payer: PPO

## 2020-01-04 ENCOUNTER — Ambulatory Visit: Payer: PPO | Admitting: Internal Medicine

## 2020-01-09 ENCOUNTER — Other Ambulatory Visit: Payer: Self-pay | Admitting: Family Medicine

## 2020-01-09 NOTE — Telephone Encounter (Signed)
LAST APPOINTMENT DATE: 08/26/2019 NEXT APPOINTMENT DATE:Visit date not found  Rx Tramadol  LAST REFILL:07/14/2019  QTY:30 Rf1

## 2020-02-07 DIAGNOSIS — Z20822 Contact with and (suspected) exposure to covid-19: Secondary | ICD-10-CM | POA: Diagnosis not present

## 2020-02-07 DIAGNOSIS — Z03818 Encounter for observation for suspected exposure to other biological agents ruled out: Secondary | ICD-10-CM | POA: Diagnosis not present

## 2020-02-07 DIAGNOSIS — Z20828 Contact with and (suspected) exposure to other viral communicable diseases: Secondary | ICD-10-CM | POA: Diagnosis not present

## 2020-02-09 DIAGNOSIS — Q268 Other congenital malformations of great veins: Secondary | ICD-10-CM | POA: Diagnosis not present

## 2020-02-09 DIAGNOSIS — I871 Compression of vein: Secondary | ICD-10-CM | POA: Diagnosis not present

## 2020-02-09 DIAGNOSIS — I491 Atrial premature depolarization: Secondary | ICD-10-CM | POA: Diagnosis not present

## 2020-02-10 DIAGNOSIS — I480 Paroxysmal atrial fibrillation: Secondary | ICD-10-CM | POA: Diagnosis not present

## 2020-02-10 DIAGNOSIS — I77810 Thoracic aortic ectasia: Secondary | ICD-10-CM | POA: Diagnosis not present

## 2020-02-10 DIAGNOSIS — I4891 Unspecified atrial fibrillation: Secondary | ICD-10-CM | POA: Diagnosis not present

## 2020-02-10 DIAGNOSIS — Q268 Other congenital malformations of great veins: Secondary | ICD-10-CM | POA: Diagnosis not present

## 2020-02-15 ENCOUNTER — Other Ambulatory Visit: Payer: Self-pay

## 2020-02-15 ENCOUNTER — Ambulatory Visit: Payer: PPO | Admitting: Internal Medicine

## 2020-02-15 ENCOUNTER — Encounter: Payer: Self-pay | Admitting: Internal Medicine

## 2020-02-15 VITALS — BP 128/76 | HR 52 | Temp 97.0°F | Ht 67.0 in | Wt 154.0 lb

## 2020-02-15 DIAGNOSIS — Q268 Other congenital malformations of great veins: Secondary | ICD-10-CM

## 2020-02-15 DIAGNOSIS — Z9889 Other specified postprocedural states: Secondary | ICD-10-CM | POA: Diagnosis not present

## 2020-02-15 DIAGNOSIS — I480 Paroxysmal atrial fibrillation: Secondary | ICD-10-CM

## 2020-02-15 DIAGNOSIS — Z8679 Personal history of other diseases of the circulatory system: Secondary | ICD-10-CM | POA: Diagnosis not present

## 2020-02-15 NOTE — Patient Instructions (Signed)
Medication Instructions:  CONTINUE WITH CURRENT MEDICATIONS. NO CHANGES.  *If you need a refill on your cardiac medications before your next appointment, please call your pharmacy*    Testing/Procedures: Cardiopulmonary exercise test    Follow-Up: At [facility], you and your health needs are our priority.  As part of our continuing mission to provide you with exceptional heart care, we have created designated Provider Care Teams.  These Care Teams include your primary Cardiologist (physician) and Advanced Practice Providers (APPs -  Physician Assistants and Nurse Practitioners) who all work together to provide you with the care you need, when you need it.  We recommend signing up for the patient portal called "MyChart".  Sign up information is provided on this After Visit Summary.  MyChart is used to connect with patients for Virtual Visits (Telemedicine).  Patients are able to view lab/test results, encounter notes, upcoming appointments, etc.  Non-urgent messages can be sent to your provider as well.   To learn more about what you can do with MyChart, go to [email or web address].    Your next appointment:   6 month(s)  The format for your next appointment:   In Person  Provider:   Cherlynn Kaiser, MD

## 2020-03-02 NOTE — Progress Notes (Signed)
Cardiology Office Note:    Date:  02/15/2020   ID:  William Underwood, DOB 08-26-1951, MRN [id number]  PCP:  [name], MD  Cardiologist:  No primary care provider on file.  Electrophysiologist:  None   Referring MD: [name], MD   Chief Complaint: f/u Pvein stenting for pvein stenosis.   History of Present Illness:    William Underwood is a 69 y.o. male well known to my clinic. Please reference prior documentation and scanned in records for full details with prior encounters.  William Underwood returns after follow-up at the [facility] clinic for history of pulmonary vein stenosis after atrial fibrillation ablation now status post pulmonary vein stenting. We again talked through the options of surveillance for pulmonary veins stenosis and pulmonary vein stenting. William Underwood had intended to have all of his follow-up testing at the [facility] clinic as I had previously recommended given the complexity of his case and specific testing requested by his interventional cardiologist that would be best performed with the [facility] clinic protocols. Unfortunately due to COVID-19 testing specifics, his cardiopulmonary exercise test could not be completed at the [facility] clinic. He was instructed to have this completed at our facility.  Patient continues to have exercise intolerance but is overall without chest pain or palpitations. Denies syncope or presyncope. We discussed indications for measurements on CT of the pulmonary veins.    Past Medical History:  Diagnosis Date  . Benign prostatic hyperplasia without urinary obstruction 06/17/2014  . BPPV (benign paroxysmal positional vertigo) 07/01/2011  . Chronic hyperkalemia 05/20/2012   Overview:  Hx of for years  . Concussion syndrome 05/15/2011   This seems like a moderately severe concussion with at least 1 minute loss of consciousness in 15-20 minutes of significant confusion including being unable to state his name   . DEGENERATIVE JOINT  DISEASE, HANDS 10/16/2009   Qualifier: Diagnosis of  By: [name], [name]    . Diverticulosis 06/20/2017  . Hemorrhoids, external 06/20/2017  . Lumbar spondylosis 06/20/2017   Fairly significant L4-L5 facet arthropathy with effusion.  Can consider facet injections and potentially RFA down the road.  Marland Kitchen NEUROPATHY 03/01/2008   Qualifier: Diagnosis of  By: [name], [name]    . Paroxysmal atrial fibrillation (HCC)   . Pulmonary insufficiency 03/09/2018   moderate PI noted on echo 03/09/18  . Pulmonary vein stenosis    left inferior pulmonary vein stenosis  . Right inguinal hernia 06/17/2014  . Sinus bradycardia   . Strain of gluteus medius 01/29/2017  . Vertigo     Past Surgical History:  Procedure Laterality Date  . ANKLE ARTHROSCOPY    . ATRIAL FIBRILLATION ABLATION N/A 05/25/2018   Procedure: ATRIAL FIBRILLATION ABLATION;  Surgeon: Thompson Grayer, MD;  Location: [place] [facility];  Service: Cardiovascular;  Laterality: N/A;  . KNEE ARTHROSCOPY      Current Medications: Current Meds  Medication Sig  . BABY ASPIRIN PO Take 81 mg by mouth daily.  . diazepam (VALIUM) 5 MG tablet Take 1 tablet (5 mg total) by mouth daily as needed for anxiety.  . Multiple Vitamins-Minerals (MULTIVITAMIN WITH MINERALS) tablet Take 1 tablet by mouth daily.   . traMADol (ULTRAM) 50 MG tablet TAKE 1 TABLET (50 MG TOTAL) BY MOUTH EVERY 6 (SIX) HOURS AS NEEDED FOR MODERATE PAIN.  Marland Kitchen Vitamin D, Ergocalciferol, 2000 units CAPS Take 2,000 Units by mouth daily.   . [DISCONTINUED] rivaroxaban (XARELTO) 20 MG TABS tablet Take by mouth.  Allergies:   Cefdinir and Codeine   Social History   Socioeconomic History  . Marital status: Married    Spouse name: Not on file  . Number of children: Not on file  . Years of education: Not on file  . Highest education level: Not on file  Occupational History  . Not on file  Tobacco Use  . Smoking status: Never Smoker  . Smokeless tobacco: Never Used  Substance and Sexual  Activity  . Alcohol use: No  . Drug use: No  . Sexual activity: Not on file  Other Topics Concern  . Not on file  Social History Narrative  . Not on file   Social Determinants of Health   Financial Resource Strain:   . Difficulty of Paying Living Expenses:   Food Insecurity:   . Worried About Charity fundraiser in the Last Year:   . Arboriculturist in the Last Year:   Transportation Needs:   . Film/video editor (Medical):   Marland Kitchen Lack of Transportation (Non-Medical):   Physical Activity:   . Days of Exercise per Week:   . Minutes of Exercise per Session:   Stress:   . Feeling of Stress :   Social Connections:   . Frequency of Communication with Friends and Family:   . Frequency of Social Gatherings with Friends and Family:   . Attends Religious Services:   . Active Member of Clubs or Organizations:   . Attends Archivist Meetings:   Marland Kitchen Marital Status:      Family History: The patient's family history includes Hearing loss in his father. There is no history of Colon cancer.  ROS:   Please see the history of present illness.    All other systems reviewed and are negative.  EKGs/Labs/Other Studies Reviewed:    The following studies were reviewed today:  EKG: Sinus bradycardia  Recent Labs: 04/17/2019: ALT 24; BUN 19; Creatinine, Ser 0.92; Hemoglobin 14.7; Platelets 196; Potassium 4.2; Sodium 134  Recent Lipid Panel    Component Value Date/Time   CHOL 170 08/26/2019 0947   TRIG 49.0 08/26/2019 0947   HDL 60.50 08/26/2019 0947   CHOLHDL 3 08/26/2019 0947   VLDL 9.8 08/26/2019 0947   LDLCALC 100 (H) 08/26/2019 0947    Physical Exam:    VS:  BP 128/76 (BP Location: Left Arm, Patient Position: Sitting, Cuff Size: Normal)   Pulse (!) 52   Temp (!) 97 F (36.1 C)   Ht 5\' 7"  (1.702 m)   Wt 154 lb (69.9 kg)   BMI 24.12 kg/m     Wt Readings from Last 5 Encounters:  02/15/20 154 lb (69.9 kg)  10/06/19 154 lb 6.4 oz (70 kg)  08/26/19 156 lb (70.8  kg)  07/14/19 153 lb (69.4 kg)  04/17/19 150 lb (68 kg)     Constitutional: No acute distress Eyes: sclera non-icteric, normal conjunctiva and lids ENMT: normal dentition, moist mucous membranes Cardiovascular: regular rhythm, normal rate, no murmurs. S1 and S2 normal. Radial pulses normal bilaterally. No jugular venous distention.  Respiratory: clear to auscultation bilaterally GI : normal bowel sounds, soft and nontender. No distention.   MSK: extremities warm, well perfused. No edema.  NEURO: grossly nonfocal exam, moves all extremities. PSYCH: alert and oriented x 3, normal mood and affect.   ASSESSMENT:    1. Pulmonary vein stenosis   2. Paroxysmal atrial fibrillation (HCC)   3. S/P ablation of atrial fibrillation    PLAN:  Pulmonary vein stenosis - Plan: EKG 12-Lead, Cardiopulmonary exercise test -I have offered in the past to attempt to coordinate his surveillance testing requested by his interventional cardiologist at the [facility] clinic. I did however recommend his repeat testing be performed under the [facility] clinic protocols so that the requested information from the interventional cardiologist be completed in a manner that would be congruent with their request. All testing except for cardiopulmonary exercise test was able to be performed at the [facility] clinic recently, no significant concerning findings per patient report. We will assist the patient in completing a cardiopulmonary exercise test. Patient was instructed to wait in the waiting room for 30 minutes afterward and have his O2 sats checked again. I described to the patient that this is not part of routine protocol, but would likely be easily coordinated and we are happy to assist in that regard.  Paroxysmal atrial fibrillation (HCC) S/P ablation of atrial fibrillation-fortunately the patient seems to be maintaining sinus rhythm. He describes no palpitations.  Total time of encounter: 35 minutes total time of  encounter, including 25 minutes spent in face-to-face patient care on the date of this encounter. This time includes coordination of care and counseling regarding above mentioned problem list. Remainder of non-face-to-face time involved reviewing chart documents/testing relevant to the patient encounter and documentation in the medical record. I have independently reviewed documentation from referring provider.   Cherlynn Kaiser, MD [place]  [facility]    Medication Adjustments/Labs and Tests Ordered: Current medicines are reviewed at length with the patient today.  Concerns regarding medicines are outlined above.  Orders Placed This Encounter  Procedures  . Cardiopulmonary exercise test  . EKG 12-Lead   No orders of the defined types were placed in this encounter.   Patient Instructions  Medication Instructions:  CONTINUE WITH CURRENT MEDICATIONS. NO CHANGES.  *If you need a refill on your cardiac medications before your next appointment, please call your pharmacy*    Testing/Procedures: Cardiopulmonary exercise test    Follow-Up: At [facility], you and your health needs are our priority.  As part of our continuing mission to provide you with exceptional heart care, we have created designated Provider Care Teams.  These Care Teams include your primary Cardiologist (physician) and Advanced Practice Providers (APPs -  Physician Assistants and Nurse Practitioners) who all work together to provide you with the care you need, when you need it.  We recommend signing up for the patient portal called "MyChart".  Sign up information is provided on this After Visit Summary.  MyChart is used to connect with patients for Virtual Visits (Telemedicine).  Patients are able to view lab/test results, encounter notes, upcoming appointments, etc.  Non-urgent messages can be sent to your provider as well.   To learn more about what you can do with MyChart, go to [email or web address].      Your next appointment:   6 month(s)  The format for your next appointment:   In Person  Provider:   Cherlynn Kaiser, MD

## 2020-03-05 ENCOUNTER — Other Ambulatory Visit (HOSPITAL_COMMUNITY)
Admission: RE | Admit: 2020-03-05 | Discharge: 2020-03-05 | Disposition: A | Discharge status: Home / Self Care | Payer: PPO | Source: Ambulatory Visit | Attending: Internal Medicine | Admitting: Internal Medicine

## 2020-03-05 DIAGNOSIS — Z01812 Encounter for preprocedural laboratory examination: Secondary | ICD-10-CM | POA: Insufficient documentation

## 2020-03-05 DIAGNOSIS — Z20822 Contact with and (suspected) exposure to covid-19: Secondary | ICD-10-CM | POA: Insufficient documentation

## 2020-03-05 LAB — SARS CORONAVIRUS 2 (TAT 6-24 HRS): SARS Coronavirus 2: NEGATIVE

## 2020-03-08 ENCOUNTER — Other Ambulatory Visit: Payer: Self-pay

## 2020-03-08 ENCOUNTER — Ambulatory Visit: Payer: PPO | Admitting: Sports Medicine

## 2020-03-08 ENCOUNTER — Ambulatory Visit (HOSPITAL_COMMUNITY): Payer: PPO | Attending: Internal Medicine

## 2020-03-08 ENCOUNTER — Other Ambulatory Visit (HOSPITAL_COMMUNITY): Payer: Self-pay | Admitting: *Deleted

## 2020-03-08 DIAGNOSIS — Q268 Other congenital malformations of great veins: Secondary | ICD-10-CM

## 2020-03-08 DIAGNOSIS — Z8679 Personal history of other diseases of the circulatory system: Secondary | ICD-10-CM | POA: Diagnosis not present

## 2020-03-08 DIAGNOSIS — I480 Paroxysmal atrial fibrillation: Secondary | ICD-10-CM | POA: Diagnosis not present

## 2020-03-08 DIAGNOSIS — Z9889 Other specified postprocedural states: Secondary | ICD-10-CM | POA: Insufficient documentation

## 2020-03-08 DIAGNOSIS — I288 Other diseases of pulmonary vessels: Secondary | ICD-10-CM

## 2020-03-08 DIAGNOSIS — Q256 Stenosis of pulmonary artery: Secondary | ICD-10-CM | POA: Diagnosis not present

## 2020-03-27 ENCOUNTER — Ambulatory Visit: Payer: PPO | Admitting: Sports Medicine

## 2020-05-27 DIAGNOSIS — I639 Cerebral infarction, unspecified: Secondary | ICD-10-CM

## 2020-05-27 HISTORY — DX: Cerebral infarction, unspecified: I63.9

## 2020-05-28 ENCOUNTER — Telehealth: Payer: Self-pay | Admitting: Family Medicine

## 2020-05-28 NOTE — Progress Notes (Signed)
  Chronic Care Management   Outreach Note  05/28/2020 Name: SOLOMAN MCKEITHAN MRN: [id number] DOB: [birth year]  Referred by: [name], MD Reason for referral : No chief complaint on file.   An unsuccessful telephone outreach was attempted today. The patient was referred to the pharmacist for assistance with care management and care coordination.   Follow Up Plan:   Earney Hamburg Upstream Scheduler

## 2020-06-15 ENCOUNTER — Telehealth: Payer: Self-pay | Admitting: Family Medicine

## 2020-06-15 NOTE — Progress Notes (Signed)
  Chronic Care Management   Outreach Note  06/15/2020 Name: William Underwood MRN: [id number] DOB: 29-Mar-1951  Referred by: [name], MD Reason for referral : No chief complaint on file.   An unsuccessful telephone outreach was attempted today. The patient was referred to the pharmacist for assistance with care management and care coordination.   Follow Up Plan:   Earney Hamburg Upstream Scheduler

## 2020-06-22 ENCOUNTER — Inpatient Hospital Stay (HOSPITAL_COMMUNITY)
Admission: EM | Admit: 2020-06-22 | Discharge: 2020-06-23 | DRG: 063 | Disposition: A | Discharge status: Home / Self Care | Payer: PPO | Attending: Neurology | Admitting: Neurology

## 2020-06-22 ENCOUNTER — Encounter (HOSPITAL_COMMUNITY): Payer: Self-pay | Admitting: Emergency Medicine

## 2020-06-22 ENCOUNTER — Emergency Department (HOSPITAL_COMMUNITY): Payer: PPO

## 2020-06-22 DIAGNOSIS — I288 Other diseases of pulmonary vessels: Secondary | ICD-10-CM | POA: Diagnosis not present

## 2020-06-22 DIAGNOSIS — I639 Cerebral infarction, unspecified: Principal | ICD-10-CM

## 2020-06-22 DIAGNOSIS — Z885 Allergy status to narcotic agent status: Secondary | ICD-10-CM | POA: Diagnosis not present

## 2020-06-22 DIAGNOSIS — R4781 Slurred speech: Secondary | ICD-10-CM | POA: Diagnosis present

## 2020-06-22 DIAGNOSIS — R270 Ataxia, unspecified: Secondary | ICD-10-CM | POA: Diagnosis not present

## 2020-06-22 DIAGNOSIS — R471 Dysarthria and anarthria: Secondary | ICD-10-CM | POA: Diagnosis present

## 2020-06-22 DIAGNOSIS — R29706 NIHSS score 6: Secondary | ICD-10-CM | POA: Diagnosis present

## 2020-06-22 DIAGNOSIS — W19XXXA Unspecified fall, initial encounter: Secondary | ICD-10-CM | POA: Diagnosis present

## 2020-06-22 DIAGNOSIS — Z8673 Personal history of transient ischemic attack (TIA), and cerebral infarction without residual deficits: Secondary | ICD-10-CM

## 2020-06-22 DIAGNOSIS — Z7982 Long term (current) use of aspirin: Secondary | ICD-10-CM

## 2020-06-22 DIAGNOSIS — I63323 Cerebral infarction due to thrombosis of bilateral anterior cerebral arteries: Secondary | ICD-10-CM

## 2020-06-22 DIAGNOSIS — Z888 Allergy status to other drugs, medicaments and biological substances status: Secondary | ICD-10-CM | POA: Diagnosis not present

## 2020-06-22 DIAGNOSIS — I6522 Occlusion and stenosis of left carotid artery: Secondary | ICD-10-CM | POA: Diagnosis not present

## 2020-06-22 DIAGNOSIS — R001 Bradycardia, unspecified: Secondary | ICD-10-CM | POA: Diagnosis not present

## 2020-06-22 DIAGNOSIS — Z79899 Other long term (current) drug therapy: Secondary | ICD-10-CM | POA: Diagnosis not present

## 2020-06-22 DIAGNOSIS — H532 Diplopia: Secondary | ICD-10-CM | POA: Diagnosis not present

## 2020-06-22 DIAGNOSIS — E860 Dehydration: Secondary | ICD-10-CM | POA: Diagnosis present

## 2020-06-22 DIAGNOSIS — E785 Hyperlipidemia, unspecified: Secondary | ICD-10-CM

## 2020-06-22 DIAGNOSIS — I480 Paroxysmal atrial fibrillation: Secondary | ICD-10-CM | POA: Diagnosis not present

## 2020-06-22 DIAGNOSIS — Z20822 Contact with and (suspected) exposure to covid-19: Secondary | ICD-10-CM | POA: Diagnosis present

## 2020-06-22 DIAGNOSIS — I6389 Other cerebral infarction: Secondary | ICD-10-CM | POA: Diagnosis not present

## 2020-06-22 DIAGNOSIS — R200 Anesthesia of skin: Secondary | ICD-10-CM | POA: Diagnosis not present

## 2020-06-22 DIAGNOSIS — Y9301 Activity, walking, marching and hiking: Secondary | ICD-10-CM | POA: Diagnosis present

## 2020-06-22 DIAGNOSIS — H8110 Benign paroxysmal vertigo, unspecified ear: Secondary | ICD-10-CM | POA: Diagnosis present

## 2020-06-22 DIAGNOSIS — R29818 Other symptoms and signs involving the nervous system: Secondary | ICD-10-CM | POA: Diagnosis not present

## 2020-06-22 DIAGNOSIS — R609 Edema, unspecified: Secondary | ICD-10-CM | POA: Diagnosis not present

## 2020-06-22 DIAGNOSIS — R55 Syncope and collapse: Secondary | ICD-10-CM | POA: Diagnosis not present

## 2020-06-22 DIAGNOSIS — G459 Transient cerebral ischemic attack, unspecified: Secondary | ICD-10-CM | POA: Diagnosis not present

## 2020-06-22 LAB — CBC WITH DIFFERENTIAL/PLATELET
Abs Immature Granulocytes: 0.08 10*3/uL — ABNORMAL HIGH (ref 0.00–0.07)
Basophils Absolute: 0.1 10*3/uL (ref 0.0–0.1)
Basophils Relative: 1 %
Eosinophils Absolute: 0.1 10*3/uL (ref 0.0–0.5)
Eosinophils Relative: 0 %
HCT: 49.1 % (ref 39.0–52.0)
Hemoglobin: 16.3 g/dL (ref 13.0–17.0)
Immature Granulocytes: 1 %
Lymphocytes Relative: 11 %
Lymphs Abs: 1.4 10*3/uL (ref 0.7–4.0)
MCH: 30.8 pg (ref 26.0–34.0)
MCHC: 33.2 g/dL (ref 30.0–36.0)
MCV: 92.8 fL (ref 80.0–100.0)
Monocytes Absolute: 0.8 10*3/uL (ref 0.1–1.0)
Monocytes Relative: 6 %
Neutro Abs: 10.3 10*3/uL — ABNORMAL HIGH (ref 1.7–7.7)
Neutrophils Relative %: 81 %
Platelets: 227 10*3/uL (ref 150–400)
RBC: 5.29 MIL/uL (ref 4.22–5.81)
RDW: 12.9 % (ref 11.5–15.5)
WBC: 12.7 10*3/uL — ABNORMAL HIGH (ref 4.0–10.5)
nRBC: 0 % (ref 0.0–0.2)

## 2020-06-22 LAB — COMPREHENSIVE METABOLIC PANEL
ALT: 21 U/L (ref 0–44)
AST: 27 U/L (ref 15–41)
Albumin: 4.2 g/dL (ref 3.5–5.0)
Alkaline Phosphatase: 43 U/L (ref 38–126)
Anion gap: 14 (ref 5–15)
BUN: 25 mg/dL — ABNORMAL HIGH (ref 8–23)
CO2: 21 mmol/L — ABNORMAL LOW (ref 22–32)
Calcium: 9.8 mg/dL (ref 8.9–10.3)
Chloride: 104 mmol/L (ref 98–111)
Creatinine, Ser: 1.05 mg/dL (ref 0.61–1.24)
GFR calc Af Amer: >60 mL/min (ref >60)
GFR calc non Af Amer: >60 mL/min (ref >60)
Glucose, Bld: 117 mg/dL — ABNORMAL HIGH (ref 70–99)
Potassium: 4 mmol/L (ref 3.5–5.1)
Sodium: 139 mmol/L (ref 135–145)
Total Bilirubin: 0.7 mg/dL (ref 0.3–1.2)
Total Protein: 7.3 g/dL (ref 6.5–8.1)

## 2020-06-22 LAB — CK: Total CK: 63 U/L (ref 49–397)

## 2020-06-22 LAB — RAPID URINE DRUG SCREEN, HOSP PERFORMED
Amphetamines: NOT DETECTED
Barbiturates: NOT DETECTED
Benzodiazepines: NOT DETECTED
Cocaine: NOT DETECTED
Opiates: NOT DETECTED
Tetrahydrocannabinol: NOT DETECTED

## 2020-06-22 LAB — I-STAT CHEM 8, ED
BUN: 28 mg/dL — ABNORMAL HIGH (ref 8–23)
Calcium, Ion: 1.22 mmol/L (ref 1.15–1.40)
Chloride: 107 mmol/L (ref 98–111)
Creatinine, Ser: 0.9 mg/dL (ref 0.61–1.24)
Glucose, Bld: 115 mg/dL — ABNORMAL HIGH (ref 70–99)
HCT: 49 % (ref 39.0–52.0)
Hemoglobin: 16.7 g/dL (ref 13.0–17.0)
Potassium: 4 mmol/L (ref 3.5–5.1)
Sodium: 141 mmol/L (ref 135–145)
TCO2: 22 mmol/L (ref 22–32)

## 2020-06-22 LAB — URINALYSIS, ROUTINE W REFLEX MICROSCOPIC
Bilirubin Urine: NEGATIVE
Glucose, UA: NEGATIVE mg/dL
Hgb urine dipstick: NEGATIVE
Ketones, ur: 20 mg/dL — AB
Leukocytes,Ua: NEGATIVE
Nitrite: NEGATIVE
Protein, ur: NEGATIVE mg/dL
Specific Gravity, Urine: >1.046 — ABNORMAL HIGH (ref 1.005–1.030)
pH: 5 (ref 5.0–8.0)

## 2020-06-22 LAB — MAGNESIUM: Magnesium: 2 mg/dL (ref 1.7–2.4)

## 2020-06-22 LAB — HIV ANTIBODY (ROUTINE TESTING W REFLEX): HIV Screen 4th Generation wRfx: NONREACTIVE

## 2020-06-22 LAB — PROTIME-INR
INR: 0.9 (ref 0.8–1.2)
Prothrombin Time: 11.9 seconds (ref 11.4–15.2)

## 2020-06-22 LAB — MRSA PCR SCREENING: MRSA by PCR: NEGATIVE

## 2020-06-22 LAB — TROPONIN I (HIGH SENSITIVITY)
Troponin I (High Sensitivity): 6 ng/L (ref <18)
Troponin I (High Sensitivity): 15 ng/L (ref <18)

## 2020-06-22 LAB — ETHANOL: Alcohol, Ethyl (B): <10 mg/dL (ref <10)

## 2020-06-22 LAB — CBG MONITORING, ED: Glucose-Capillary: 113 mg/dL — ABNORMAL HIGH (ref 70–99)

## 2020-06-22 LAB — SARS CORONAVIRUS 2 BY RT PCR (HOSPITAL ORDER, PERFORMED IN ~~LOC~~ HOSPITAL LAB): SARS Coronavirus 2: NEGATIVE

## 2020-06-22 MED ORDER — ALTEPLASE (STROKE) FULL DOSE INFUSION
0.9000 mg/kg | Freq: Once | INTRAVENOUS | Status: AC
  Administered 2020-06-22: 62.3 mg via INTRAVENOUS
  Filled 2020-06-22: qty 100

## 2020-06-22 MED ORDER — SODIUM CHLORIDE 0.9 % IV SOLN
50.0000 mL | Freq: Once | INTRAVENOUS | Status: AC
  Administered 2020-06-22: 50 mL via INTRAVENOUS

## 2020-06-22 MED ORDER — ACETAMINOPHEN 325 MG PO TABS
650.0000 mg | ORAL_TABLET | ORAL | Status: DC | PRN
  Administered 2020-06-22: 650 mg via ORAL
  Filled 2020-06-22: qty 2

## 2020-06-22 MED ORDER — ACETAMINOPHEN 650 MG RE SUPP: 650.0000 mg | RECTAL | Status: DC | PRN

## 2020-06-22 MED ORDER — STROKE: EARLY STAGES OF RECOVERY BOOK
Freq: Once | Status: DC
  Filled 2020-06-22: qty 1

## 2020-06-22 MED ORDER — PANTOPRAZOLE SODIUM 40 MG IV SOLR
40.0000 mg | Freq: Every day | INTRAVENOUS | Status: DC
  Administered 2020-06-22: 40 mg via INTRAVENOUS
  Filled 2020-06-22: qty 40

## 2020-06-22 MED ORDER — CLEVIDIPINE BUTYRATE 0.5 MG/ML IV EMUL: 0.0000 mg/h | INTRAVENOUS | Status: DC

## 2020-06-22 MED ORDER — SODIUM CHLORIDE 0.9 % IV SOLN
INTRAVENOUS | Status: AC
  Administered 2020-06-22: 1000 mL via INTRAVENOUS

## 2020-06-22 MED ORDER — IOHEXOL 350 MG/ML SOLN
75.0000 mL | Freq: Once | INTRAVENOUS | Status: AC | PRN
  Administered 2020-06-22: 75 mL via INTRAVENOUS

## 2020-06-22 MED ORDER — SENNOSIDES-DOCUSATE SODIUM 8.6-50 MG PO TABS: 1.0000 | ORAL_TABLET | Freq: Every evening | ORAL | Status: DC | PRN

## 2020-06-22 MED ORDER — ACETAMINOPHEN 160 MG/5ML PO SOLN: 650.0000 mg | ORAL | Status: DC | PRN

## 2020-06-22 NOTE — Consult Note (Addendum)
Referring Physician: Wyn Quaker, PA    Chief Complaint: Acute onset of right sided numbness  HPI: William Underwood is an 69 y.o. male with a PMHx of BPPV, paroxysmal atrial fibrillation s/p ablation and pulmonary vein stenosis who presented to the ED early this afternoon with a c/c of sudden onset numbness on his right side while riding his indoor bike. He did report a history of TIA. After he arrived, he stated that his symptoms seemed to be resolving. He had no drift but his grip was weaker on the right. His speech was clear and he was alert and fully oriented. His RN checked on him at 2 PM and he was normal. Subsequently, the EDP went to place an IV and at that time the patient was exhibiting slurred speech, had double vision, and was unsteady on his feet. A Code Stroke was called and he was brought emergently to CT. CT revealed no acute hemorrhage. CTA was then obtained.   His home medications include ASA 81 mg po qd.   LSN: 2:00 PM tPA Given: Yes  Past Medical History:  Diagnosis Date  . Benign prostatic hyperplasia without urinary obstruction 06/17/2014  . BPPV (benign paroxysmal positional vertigo) 07/01/2011  . Chronic hyperkalemia 05/20/2012   Overview:  Hx of for years  . Concussion syndrome 05/15/2011   This seems like a moderately severe concussion with at least 1 minute loss of consciousness in 15-20 minutes of significant confusion including being unable to state his name   . DEGENERATIVE JOINT DISEASE, HANDS 10/16/2009   Qualifier: Diagnosis of  By: [name], [name]    . Diverticulosis 06/20/2017  . Hemorrhoids, external 06/20/2017  . Lumbar spondylosis 06/20/2017   Fairly significant L4-L5 facet arthropathy with effusion.  Can consider facet injections and potentially RFA down the road.  Marland Kitchen NEUROPATHY 03/01/2008   Qualifier: Diagnosis of  By: [name], [name]    . Paroxysmal atrial fibrillation (HCC)   . Pulmonary insufficiency 03/09/2018   moderate PI noted on echo 03/09/18  .  Pulmonary vein stenosis    left inferior pulmonary vein stenosis  . Right inguinal hernia 06/17/2014  . Sinus bradycardia   . Strain of gluteus medius 01/29/2017  . Vertigo     Past Surgical History:  Procedure Laterality Date  . ANKLE ARTHROSCOPY    . ATRIAL FIBRILLATION ABLATION N/A 05/25/2018   Procedure: ATRIAL FIBRILLATION ABLATION;  Surgeon: Thompson Grayer, MD;  Location: [place] [facility];  Service: Cardiovascular;  Laterality: N/A;  . KNEE ARTHROSCOPY      Family History  Problem Relation Age of Onset  . Hearing loss Father   . Colon cancer Neg Hx    Social History:  reports that he has never smoked. He has never used smokeless tobacco. He reports that he does not drink alcohol and does not use drugs.  Allergies:  Allergies  Allergen Reactions  . Cefdinir Palpitations and Rash  . Codeine Other (See Comments)    Sensitive, agitation    Home Medications:  No current facility-administered medications on file prior to encounter.   Current Outpatient Medications on File Prior to Encounter  Medication Sig Dispense Refill  . BABY ASPIRIN PO Take 81 mg by mouth daily.    . diazepam (VALIUM) 5 MG tablet Take 1 tablet (5 mg total) by mouth daily as needed for anxiety. 30 tablet 1  . Multiple Vitamins-Minerals (MULTIVITAMIN WITH MINERALS) tablet Take 1 tablet by mouth daily.     . traMADol Veatrice Bourbon)  50 MG tablet TAKE 1 TABLET (50 MG TOTAL) BY MOUTH EVERY 6 (SIX) HOURS AS NEEDED FOR MODERATE PAIN. 30 tablet 5  . Vitamin D, Ergocalciferol, 2000 units CAPS Take 2,000 Units by mouth daily.        ROS: He is nauseated and has double vision. Other symptoms as per HPI. Detailed ROS deferred due to acuity of presentation.    Physical Examination: Blood pressure 107/81, pulse 72, temperature 98.1 F (36.7 C), temperature source Oral, resp. rate 18, height 5\' 7"  (1.702 m), weight 68 kg, SpO2 100 %.  HEENT: [place]/AT Lungs: Respirations unlabored Ext: No edema  Neurologic  Examination: Mental Status: Awake and alert. Speech is with significant dysarthria but is fluent. Comprehension and naming intact. Fully oriented.  Cranial Nerves: II:  Visual fields intact in each eye tested individually. PERRL.  III,IV, VI: Squints left eye due to double vision. Grossly abnormal EOM: When gazing to the right he can fully deviate both eyes, but there is nystagmus on endgaze of right eye that persists. When gazing up at the midline, left eye deviates laterally while right eye remains at the midline. Intermittent vertical nystagmus of right eye is also noted while at the midline. On leftward gaze, he cannot adduct his right eye past the midline and the fully abducted left eye exhibits coarse horizontal nystagmus.  V,VII: Grimace is symmetric, facial temp sensation equal bilaterally VIII: hearing intact to voice IX,X: Palate rises symmetrically XI: Symmetric shoulder shrug XII: Midline tongue extension  Motor: RUE with subtle 4+/5 weakness proximally and distally RLE 5/5 LUE and LLE 5/5 No pronator drift Sensory:Temp and light touch intact x 4. No extinction to DSS.  Deep Tendon Reflexes:  1+ bilateral brachioradialis and biceps.  2+ bilateral patellae Plantars: Right: downgoing   Left: downgoing Cerebellar: Ataxic FNF and H-S bilaterally.  Gait: Deferred  Results for orders placed or performed during the hospital encounter of 06/22/20 (from the past 48 hour(s))  CBG monitoring, ED     Status: Abnormal   Collection Time: 06/22/20 12:33 PM  Result Value Ref Range   Glucose-Capillary 113 (H) 70 - 99 mg/dL    Comment: Glucose reference range applies only to samples taken after fasting for at least 8 hours.  Comprehensive metabolic panel     Status: Abnormal   Collection Time: 06/22/20 12:52 PM  Result Value Ref Range   Sodium 139 135 - 145 mmol/L   Potassium 4.0 3.5 - 5.1 mmol/L   Chloride 104 98 - 111 mmol/L   CO2 21 (L) 22 - 32 mmol/L   Glucose, Bld 117 (H) 70 - 99  mg/dL    Comment: Glucose reference range applies only to samples taken after fasting for at least 8 hours.   BUN 25 (H) 8 - 23 mg/dL   Creatinine, Ser 1.05 0.61 - 1.24 mg/dL   Calcium 9.8 8.9 - 10.3 mg/dL   Total Protein 7.3 6.5 - 8.1 g/dL   Albumin 4.2 3.5 - 5.0 g/dL   AST 27 15 - 41 U/L   ALT 21 0 - 44 U/L   Alkaline Phosphatase 43 38 - 126 U/L   Total Bilirubin 0.7 0.3 - 1.2 mg/dL   GFR calc non Af Amer >60 >60 mL/min   GFR calc Af Amer >60 >60 mL/min   Anion gap 14 5 - 15    Comment: Performed at [facility], [place] [address] [place], [place] [address]  CBC WITH DIFFERENTIAL     Status:  Abnormal   Collection Time: 06/22/20 12:52 PM  Result Value Ref Range   WBC 12.7 (H) 4.0 - 10.5 K/uL   RBC 5.29 4.22 - 5.81 MIL/uL   Hemoglobin 16.3 13.0 - 17.0 g/dL   HCT 49.1 39 - 52 %   MCV 92.8 80.0 - 100.0 fL   MCH 30.8 26.0 - 34.0 pg   MCHC 33.2 30.0 - 36.0 g/dL   RDW 12.9 11.5 - 15.5 %   Platelets 227 150 - 400 K/uL   nRBC 0.0 0.0 - 0.2 %   Neutrophils Relative % 81 %   Neutro Abs 10.3 (H) 1.7 - 7.7 K/uL   Lymphocytes Relative 11 %   Lymphs Abs 1.4 0.7 - 4.0 K/uL   Monocytes Relative 6 %   Monocytes Absolute 0.8 0 - 1 K/uL   Eosinophils Relative 0 %   Eosinophils Absolute 0.1 0 - 0 K/uL   Basophils Relative 1 %   Basophils Absolute 0.1 0 - 0 K/uL   Immature Granulocytes 1 %   Abs Immature Granulocytes 0.08 (H) 0.00 - 0.07 K/uL    Comment: Performed at [facility], [address] [place], [place] [address]  Protime-INR     Status: None   Collection Time: 06/22/20 12:52 PM  Result Value Ref Range   Prothrombin Time 11.9 11.4 - 15.2 seconds   INR 0.9 0.8 - 1.2    Comment: (NOTE) INR goal varies based on device and disease states. Performed at [facility], [place] [address], [place], [place] [address]   CK     Status: None   Collection Time: 06/22/20 12:52 PM  Result Value Ref Range   Total CK 63 49.0 - 397.0 U/L    Comment: Performed at [facility], [place] [address], [place], [place] [address]  Troponin I (High Sensitivity)     Status: None   Collection Time: 06/22/20 12:52 PM  Result Value Ref Range   Troponin I (High Sensitivity) 6 <18 ng/L    Comment: (NOTE) Elevated high sensitivity troponin I (hsTnI) values and significant  changes across serial measurements may suggest ACS but many other  chronic and acute conditions are known to elevate hsTnI results.  Refer to the "Links" section for chest pain algorithms and additional  guidance. Performed at [facility], [place] [address], [place], [place] [address]   Magnesium     Status: None   Collection Time: 06/22/20 12:52 PM  Result Value Ref Range   Magnesium 2.0 1.7 - 2.4 mg/dL    Comment: Performed at [facility], [place] [address], [place], [place] [address]  I-stat chem 8, ED     Status: Abnormal   Collection Time: 06/22/20 12:58 PM  Result Value Ref Range   Sodium 141 135 - 145 mmol/L   Potassium 4.0 3.5 - 5.1 mmol/L   Chloride 107 98 - 111 mmol/L   BUN 28 (H) 8 - 23 mg/dL   Creatinine, Ser 0.90 0.61 - 1.24 mg/dL   Glucose, Bld 115 (H) 70 - 99 mg/dL    Comment: Glucose reference range applies only to samples taken after fasting for at least 8 hours.   Calcium, Ion 1.22 1.15 - 1.40 mmol/L   TCO2 22 22 - 32 mmol/L   Hemoglobin 16.7 13.0 - 17.0 g/dL   HCT 49.0 39 - 52 %   DG Chest 2 View  Result Date: 06/22/2020 CLINICAL DATA:  Near syncope with  right-sided numbness EXAM: CHEST - 2 VIEW COMPARISON:  July 07, 2018 FINDINGS: There are presumed left-sided bronchial stents. Lungs clear. Heart size and pulmonary vascular normal. No adenopathy. No pneumothorax. No bone lesions. IMPRESSION: Presumed left-sided bronchial stents. No edema or airspace opacity. Heart size normal. No adenopathy. Electronically Signed   By: Lowella Grip III M.D.   On: 06/22/2020 13:14    Assessment: 69 y.o. male presenting with stuttering stroke symptoms and signs that are  localize to the brainstem +/- cerebellar and thalamic involvement 1. Exam reveals ocular motility defect on the right, c/w an INO and suggestive of a lesion of the right MLF, he also has bilateral upper and lower extremity ataxia without weakness, suggestive of bilateral cerebellar lesions or bilateral cortico-ponto-cerebellar pathway lesions within the brainstem.  2. CT with no hemorrhage or other acute finding 3. CTA of head and neck with no LVO 4. Stroke Risk Factors - Prior history of atrial fibrillation; however, he is s/p ablation procedure 5. After comprehensive review of possible contraindications, he has no absolute contraindications to tPA administration. Patient is a tPA candidate. Discussed extensively the risks/benefits of tPA treatment vs. no treatment with the patient, including risks of hemorrhage and death with tPA administration versus worse overall outcomes on average in patients within tPA time window who are not administered tPA. Overall benefits of tPA regarding long-term prognosis are felt to outweigh risks. The patient expressed understanding and wish to proceed with tPA.   Plan: 1. Admitting to Neuro ICU.  2. Post-tPA order set to include frequent neuro checks and BP management.  3. No antiplatelet medications or anticoagulants for at least 24 hours following tPA.  4. DVT prophylaxis with SCDs.  5. Will need to be started on a statin.  6. Will need escalation of antiplatelet therapy if follow up CT at 24 hours is negative for hemorrhagic conversion. Classifiable as having failed ASA monotherapy.  7. Telemetry monitoring. 8. TTE.  9. MRI brain 10. PT/OT/Speech.  11. NPO until passes swallow evaluation.  12. Fasting lipid panel, HgbA1c   55 minutes spent in the emergent neurological evaluation and management of this critically ill patient.   @Electronically  signed: Dr. Kerney Elbe  06/22/2020, 2:46 PM

## 2020-06-22 NOTE — Code Documentation (Signed)
Stroke Response Nurse Documentation Code Documentation  William Underwood is a 69 y.o. male arriving to [facility] ED via Private Vehicle on 06/22/20 with past medical hx of atrial fibrillation s/p ablation 2019, sinus bradycardia, benign paroxysmal positional vertigo, concussion,pulmonary vein stenosis and neuropathy. Patient from home and came to the emergency department complaining of tinging in his right arm. Patient had been riding stationary bike at time symptoms noted.  Assessed by provider and believed to be ulner paresthesia. Slight right hand grip weakness noted initially but resolved soon after assessment. LKW at 1321 when PA was attempting to get IV access and noted patient had new onset slurred speech. Code stroke activated in ED.Labs drawn and patient cleared for CT by Wyn Quaker PA.  Stroke team met patient at CT. NIHSS 6, see documentation for details and code stroke times. Patient with disoriented, right eye unable to cross midline, expressive aphasia, dysarthria and generalized ataxia on exam. The following imaging was completed: CT, CTA head and neck.   Patient is a candidate for tPA. Delay in treatment due to weighing feature on bed malfunction. BP 135/65. Tpa started at 1500. Care/Plan admit to ICU. VS/neuro checks per post tpa protocol. Bedside handoff with ED RN Jovita Kussmaul.    Leverne Humbles Stroke Response RN

## 2020-06-22 NOTE — Research (Signed)
Discussed participation in [place] trial. Provided both patient and girlfriend, Eustaquio Maize, with information sheet on trial. Patient agrees to participate and understands he will have questions asked prior to discharge and again in 90 days.He is aware he can decline participation at anytime. No questions at this time.

## 2020-06-22 NOTE — ED Triage Notes (Signed)
Pt arrives to ED with c/o of sudden onset of numbness while riding his indoor bike, pt reports hx of tia. Pt states now this seems to be resolving. Pt has no drift but weaker grip on right. NO facial droop clear speech. Alert and ox4.

## 2020-06-22 NOTE — Progress Notes (Addendum)
Pt's belongings include:  Wallet with various cards and $3 cash Clothes Shoes Cell phone

## 2020-06-22 NOTE — ED Provider Notes (Signed)
[name]PART[facility] Provider Note   CSN: [id number] Arrival date & time: 06/22/20  1226     History Chief Complaint  Patient presents with  . Numbness    William Underwood is a 69 y.o. male with past medical history of A. fib status post ablation, does not take anticoagulants, pulmonary vein stenosis, vertigo, who presents today for evaluation after a possible TIA.  He reports that he was on his exercise bike his usual and when he got off he was walking and developed weakness causing him to fall to the ground.  He states he did not strike his head. Felt like both sides of his body were weak, and he felt like over a few minutes the left side returned to normal however the right side took longer.  He reports that he still has paresthesias in his right hand however otherwise feels back to normal.  Denies any chest pain or shortness of breath.  No recent sickness or illness.  No significant back or abdominal pain.  Reports that he has been well hydrated today. Reports that during this event he was able to see a mirror and states that he was able to fully move his face without any facial droop noted.    He has had a prior TIA.   Denies any significant headache at this time.  HPI     Past Medical History:  Diagnosis Date  . Benign prostatic hyperplasia without urinary obstruction 06/17/2014  . BPPV (benign paroxysmal positional vertigo) 07/01/2011  . Chronic hyperkalemia 05/20/2012   Overview:  Hx of for years  . Concussion syndrome 05/15/2011   This seems like a moderately severe concussion with at least 1 minute loss of consciousness in 15-20 minutes of significant confusion including being unable to state his name   . DEGENERATIVE JOINT DISEASE, HANDS 10/16/2009   Qualifier: Diagnosis of  By: [name], [name]    . Diverticulosis 06/20/2017  . Hemorrhoids, external 06/20/2017  . Lumbar spondylosis 06/20/2017   Fairly significant L4-L5 facet arthropathy with  effusion.  Can consider facet injections and potentially RFA down the road.  Marland Kitchen NEUROPATHY 03/01/2008   Qualifier: Diagnosis of  By: [name], [name]    . Paroxysmal atrial fibrillation (HCC)   . Pulmonary insufficiency 03/09/2018   moderate PI noted on echo 03/09/18  . Pulmonary vein stenosis    left inferior pulmonary vein stenosis  . Right inguinal hernia 06/17/2014  . Sinus bradycardia   . Strain of gluteus medius 01/29/2017  . Vertigo     Patient Active Problem List   Diagnosis Date Noted  . Stroke ([place]) 06/22/2020  . Arthralgia 07/14/2019  . Pulmonary vein stenosis s/p stenting 2020 01/02/2019  . Chronic neck pain 08/23/2018  . Tinnitus 08/23/2018  . Lipoma 08/23/2018  . Paroxysmal atrial fibrillation ([place]) 05/25/2018  . Diverticulosis 06/20/2017  . Hemorrhoids, external 06/20/2017  . Benign prostatic hyperplasia without urinary obstruction 06/17/2014  . BPPV (benign paroxysmal positional vertigo) 07/01/2011  . DEGENERATIVE JOINT DISEASE, HANDS 10/16/2009  . NEUROPATHY 03/01/2008    Past Surgical History:  Procedure Laterality Date  . ANKLE ARTHROSCOPY    . ATRIAL FIBRILLATION ABLATION N/A 05/25/2018   Procedure: ATRIAL FIBRILLATION ABLATION;  Surgeon: Thompson Grayer, MD;  Location: [facility];  Service: Cardiovascular;  Laterality: N/A;  . KNEE ARTHROSCOPY         Family History  Problem Relation Age of Onset  . Hearing loss Father   . Colon  cancer Neg Hx     Social History   Tobacco Use  . Smoking status: Never Smoker  . Smokeless tobacco: Never Used  Vaping Use  . Vaping Use: Never used  Substance Use Topics  . Alcohol use: No  . Drug use: No    Home Medications Prior to Admission medications   Medication Sig Start Date End Date Taking? Authorizing Provider  BABY ASPIRIN PO Take 81 mg by mouth daily.    [provider]  diazepam (VALIUM) 5 MG tablet Take 1 tablet (5 mg total) by mouth daily as needed for anxiety. 08/23/18   [name], MD  Multiple Vitamins-Minerals (MULTIVITAMIN WITH MINERALS) tablet Take 1 tablet by mouth daily.     [provider]  traMADol (ULTRAM) 50 MG tablet TAKE 1 TABLET (50 MG TOTAL) BY MOUTH EVERY 6 (SIX) HOURS AS NEEDED FOR MODERATE PAIN. 01/10/20   [name], MD  Vitamin D, Ergocalciferol, 2000 units CAPS Take 2,000 Units by mouth daily.     [provider]    Allergies    Cefdinir and Codeine  Review of Systems   Review of Systems  Constitutional: Negative for chills and fever.  HENT: Negative for congestion.   Eyes: Negative for pain and visual disturbance.  Respiratory: Negative for cough, chest tightness and shortness of breath.   Cardiovascular: Negative for chest pain.  Gastrointestinal: Negative for abdominal pain, diarrhea, nausea and vomiting.  Musculoskeletal: Negative for back pain and neck pain.  Skin: Negative for color change, rash and wound.  Neurological: Positive for weakness (Resolved). Negative for dizziness, numbness (Parasthesias) and headaches.  Psychiatric/Behavioral: Negative for confusion.  All other systems reviewed and are negative.   Physical Exam Updated Vital Signs BP 123/76   Pulse 65   Temp 98.1 F (36.7 C) (Oral)   Resp 15   Ht 5\' 7"  (1.702 m)   Wt 69.2 kg   SpO2 96%   BMI 23.89 kg/m   Physical Exam Vitals and nursing note reviewed.  Constitutional:      General: He is not in acute distress.    Appearance: He is well-developed. He is not diaphoretic.  HENT:     Head: Normocephalic and atraumatic.  Eyes:     General: No scleral icterus.       Right eye: No discharge.        Left eye: No discharge.     Conjunctiva/sclera: Conjunctivae normal.  Cardiovascular:     Rate and Rhythm: Normal rate and regular rhythm.     Pulses: Normal pulses.     Comments: 2+ radial and PT pulses bilaterally.  Pulmonary:     Effort: Pulmonary effort is normal. No respiratory distress.     Breath sounds: No stridor.  Abdominal:      General: There is no distension.     Tenderness: There is no abdominal tenderness. There is no guarding.  Musculoskeletal:        General: No deformity.     Cervical back: Normal range of motion and neck supple.     Right lower leg: No edema.     Left lower leg: No edema.  Skin:    General: Skin is warm and dry.  Neurological:     Mental Status: He is alert.     Motor: No abnormal muscle tone.     Comments: Mental Status:  Alert, oriented, thought content appropriate, able to give a coherent history. Speech fluent without evidence of aphasia.  Able to follow 2 step commands without difficulty.  Cranial Nerves:  II:  Peripheral visual fields grossly normal, pupils equal, round, reactive to light III,IV, VI: ptosis not present, extra-ocular motions intact bilaterally, no nystagmus V,VII: smile symmetric, facial light touch sensation equal VIII: hearing grossly normal to voice  X: uvula elevates symmetrically  XI: bilateral shoulder shrug symmetric and strong XII: midline tongue extension without fassiculations Motor:  Normal tone. 5/5 in upper and lower extremities bilaterally including strong and equal grip strength and dorsiflexion/plantar flexion Coordination is grossly intact.   Gait: normal gait and balance CV: distal pulses palpable throughout  Right hand with paresthesias along ulnar nerve distribution including the right small finger and the ulnar aspect of the right ring finger.     Psychiatric:        Mood and Affect: Mood normal.        Behavior: Behavior normal.     ED Results / Procedures / Treatments   Labs (all labs ordered are listed, but only abnormal results are displayed) Labs Reviewed  COMPREHENSIVE METABOLIC PANEL - Abnormal; Notable for the following components:      Result Value   CO2 21 (*)    Glucose, Bld 117 (*)    BUN 25 (*)    All other components within normal limits  CBC WITH DIFFERENTIAL/PLATELET - Abnormal; Notable for the following  components:   WBC 12.7 (*)    Neutro Abs 10.3 (*)    Abs Immature Granulocytes 0.08 (*)    All other components within normal limits  CBG MONITORING, ED - Abnormal; Notable for the following components:   Glucose-Capillary 113 (*)    All other components within normal limits  I-STAT CHEM 8, ED - Abnormal; Notable for the following components:   BUN 28 (*)    Glucose, Bld 115 (*)    All other components within normal limits  SARS CORONAVIRUS 2 BY RT PCR (HOSPITAL ORDER, [facility])  PROTIME-INR  CK  MAGNESIUM  ETHANOL  RAPID URINE DRUG SCREEN, HOSP PERFORMED  URINALYSIS, ROUTINE W REFLEX MICROSCOPIC  HIV ANTIBODY (ROUTINE TESTING W REFLEX)  TROPONIN I (HIGH SENSITIVITY)  TROPONIN I (HIGH SENSITIVITY)    EKG None  Radiology CT Angio Head W or Wo Contrast  Result Date: 06/22/2020 CLINICAL DATA:  TIA EXAM: CT ANGIOGRAPHY HEAD AND NECK TECHNIQUE: Multidetector CT imaging of the head and neck was performed using the standard protocol during bolus administration of intravenous contrast. Multiplanar CT image reconstructions and MIPs were obtained to evaluate the vascular anatomy. Carotid stenosis measurements (when applicable) are obtained utilizing NASCET criteria, using the distal internal carotid diameter as the denominator. CONTRAST:  44mL OMNIPAQUE IOHEXOL 350 MG/ML SOLN COMPARISON:  CT head 06/22/2020 FINDINGS: CTA NECK FINDINGS Aortic arch: Standard branching. Imaged portion shows no evidence of aneurysm or dissection. No significant stenosis of the major arch vessel origins. Right carotid system: Normal right carotid. Negative for atherosclerotic disease, stenosis, or dissection Left carotid system: Mild atherosclerotic disease left carotid bifurcation without significant stenosis. Vertebral arteries: Both vertebral arteries are normal and patent to the basilar. Skeleton: Mild degenerative changes in the cervical spine. No acute skeletal abnormality. Other  neck: Negative for mass or adenopathy in the neck. Upper chest: Visualized lungs are clear bilaterally. No mediastinal mass. Review of the MIP images confirms the above findings CTA HEAD FINDINGS Anterior circulation: Cavernous carotid normal bilaterally without atherosclerotic disease or stenosis. Anterior and middle cerebral arteries normal bilaterally. Posterior circulation:  Both vertebral arteries patent to the basilar. Left vertebral artery dominant. PICA patent bilaterally. AICA, superior cerebellar, and posterior cerebral arteries patent bilaterally. Fetal origin right posterior cerebral artery. Basilar widely patent without atherosclerotic disease. Venous sinuses: Normal venous enhancement Anatomic variants: None Review of the MIP images confirms the above findings IMPRESSION: 1. Negative for intracranial large vessel occlusion. No significant intracranial stenosis. Normal basilar. 2. Mild atherosclerotic disease left carotid bifurcation without stenosis. 3. Right carotid normal.  Both vertebral arteries normal. 4. These results were called by telephone at the time of interpretation on 06/22/2020 at 3:04 pm to provider Lindzen , who verbally acknowledged these results. Electronically Signed   By: Franchot Gallo M.D.   On: 06/22/2020 15:05   DG Chest 2 View  Result Date: 06/22/2020 CLINICAL DATA:  Near syncope with right-sided numbness EXAM: CHEST - 2 VIEW COMPARISON:  July 07, 2018 FINDINGS: There are presumed left-sided bronchial stents. Lungs clear. Heart size and pulmonary vascular normal. No adenopathy. No pneumothorax. No bone lesions. IMPRESSION: Presumed left-sided bronchial stents. No edema or airspace opacity. Heart size normal. No adenopathy. Electronically Signed   By: Lowella Grip III M.D.   On: 06/22/2020 13:14   CT HEAD WO CONTRAST  Result Date: 06/22/2020 CLINICAL DATA:  Transient ischemic attack. EXAM: CT HEAD WITHOUT CONTRAST TECHNIQUE: Contiguous axial images were obtained  from the base of the skull through the vertex without intravenous contrast. COMPARISON:  04/17/2019 FINDINGS: Brain: The brain shows a normal appearance without evidence of malformation, atrophy, old or acute small or large vessel infarction, mass lesion, hemorrhage, hydrocephalus or extra-axial collection. Vascular: No hyperdense vessel. No evidence of atherosclerotic calcification. Skull: Normal.  No traumatic finding.  No focal bone lesion. Sinuses/Orbits: Sinuses are clear. Orbits appear normal. Mastoids are clear. Other: None significant IMPRESSION: Normal head CT.  Normal head CT. Electronically Signed   By: Nelson Chimes M.D.   On: 06/22/2020 14:47   CT Angio Neck W and/or Wo Contrast  Result Date: 06/22/2020 CLINICAL DATA:  TIA EXAM: CT ANGIOGRAPHY HEAD AND NECK TECHNIQUE: Multidetector CT imaging of the head and neck was performed using the standard protocol during bolus administration of intravenous contrast. Multiplanar CT image reconstructions and MIPs were obtained to evaluate the vascular anatomy. Carotid stenosis measurements (when applicable) are obtained utilizing NASCET criteria, using the distal internal carotid diameter as the denominator. CONTRAST:  85mL OMNIPAQUE IOHEXOL 350 MG/ML SOLN COMPARISON:  CT head 06/22/2020 FINDINGS: CTA NECK FINDINGS Aortic arch: Standard branching. Imaged portion shows no evidence of aneurysm or dissection. No significant stenosis of the major arch vessel origins. Right carotid system: Normal right carotid. Negative for atherosclerotic disease, stenosis, or dissection Left carotid system: Mild atherosclerotic disease left carotid bifurcation without significant stenosis. Vertebral arteries: Both vertebral arteries are normal and patent to the basilar. Skeleton: Mild degenerative changes in the cervical spine. No acute skeletal abnormality. Other neck: Negative for mass or adenopathy in the neck. Upper chest: Visualized lungs are clear bilaterally. No mediastinal  mass. Review of the MIP images confirms the above findings CTA HEAD FINDINGS Anterior circulation: Cavernous carotid normal bilaterally without atherosclerotic disease or stenosis. Anterior and middle cerebral arteries normal bilaterally. Posterior circulation: Both vertebral arteries patent to the basilar. Left vertebral artery dominant. PICA patent bilaterally. AICA, superior cerebellar, and posterior cerebral arteries patent bilaterally. Fetal origin right posterior cerebral artery. Basilar widely patent without atherosclerotic disease. Venous sinuses: Normal venous enhancement Anatomic variants: None Review of the MIP images confirms the above findings IMPRESSION:  1. Negative for intracranial large vessel occlusion. No significant intracranial stenosis. Normal basilar. 2. Mild atherosclerotic disease left carotid bifurcation without stenosis. 3. Right carotid normal.  Both vertebral arteries normal. 4. These results were called by telephone at the time of interpretation on 06/22/2020 at 3:04 pm to provider Lindzen , who verbally acknowledged these results. Electronically Signed   By: Franchot Gallo M.D.   On: 06/22/2020 15:05    Procedures .Critical Care Performed by: Lorin Glass, PA-C Authorized by: Lorin Glass, PA-C   Critical care provider statement:    Critical care time (minutes):  45   Critical care time was exclusive of:  Separately billable procedures and treating other patients and teaching time   Critical care was necessary to treat or prevent imminent or life-threatening deterioration of the following conditions:  CNS failure or compromise   Critical care was time spent personally by me on the following activities:  Discussions with consultants, evaluation of patient's response to treatment, examination of patient, ordering and performing treatments and interventions, ordering and review of laboratory studies, ordering and review of radiographic studies, pulse oximetry,  re-evaluation of patient's condition, obtaining history from patient or surrogate and review of old charts   (including critical care time)  Medications Ordered in ED Medications  alteplase (ACTIVASE) 1 mg/mL infusion 62.3 mg (62.3 mg Intravenous New Bag/Given 06/22/20 1500)    Followed by  0.9 %  sodium chloride infusion (has no administration in time range)  iohexol (OMNIPAQUE) 350 MG/ML injection 75 mL (75 mLs Intravenous Contrast Given 06/22/20 1453)    ED Course  I have reviewed the triage vital signs and the nursing notes.  Pertinent labs & imaging results that were available during my care of the patient were reviewed by me and considered in my medical decision making (see chart for details).  Clinical Course as of Jun 22 1530  Fri Jun 22, 2020  1301 I spoke with Dr. Cheral Marker.  Aside from ulnar nerve distribution paresthesias his symptoms have resolved at this time. After discussions with neurology CTA head and neck, along with normal noncontrast CT head and screening labs are ordered.  Suspect TIA.   [EH]  2505 When I went to place an IV and patient in the waiting room to facilitate getting his CTA head and neck I noted that patient's speech was now slurred.  Waiting room staff reports that when they saw him about half an hour prior his speech was not slurred.  This is a significant change from when I originally evaluated him.  I immediately brought patient back to the bridge and ordered code stroke.  [employer] engineer asked to page code stroke.    [EH]  1422 I called Dr. Cheral Marker, on-call for neurology directly to inform him of patient's status change, that he is now a code stroke.  New last seen normal is 2 PM after discussions with Dr. Cheral Marker as his prior symptoms had fully resolved.   [EH]    Clinical Course User Index [EH] Ollen Gross   MDM Rules/Calculators/A&P                         Patient is a 69 year old man who presents today for evaluation after an  event that happened at home where he had weakness in his full body that resolved.  I originally saw him in triage as I was asked to see him by nursing staff.  When I evaluated him  initially had some residual paresthesias in the right hand and arm which was localized in a ulnar nerve distribution and patient was otherwise neurologically intact.  I spoke with Dr. Cheral Marker, patient does not meet code stroke criteria at that time.  I ordered CTA head and neck along with normal CT and labs and screening chest x-ray.    Labs had returned while patient was still in the waiting room, and I went out to the waiting room to start an IV to facilitate timely completion of his CTA scans.  When I evaluated patient he had had a significant change, was now slurring his speech, generally weak.   Given the significant change in patient's condition stroke is activated.  Waiting room staff reports that they saw patient about half an hour before this started and he was still normal, LSN 1400.  I personally accompanied patient to CT scan and directly call Dr. Cheral Marker to inform them of change in condition.    CBC, CMP, CK, PT/INR, troponin, magnesium all do not show causes of patient's symptoms.  Patient will be admitted by Dr. Cheral Marker.   Note: Portions of this report may have been transcribed using voice recognition software. Every effort was made to ensure accuracy; however, inadvertent computerized transcription errors may be present   Final Clinical Impression(s) / ED Diagnoses Final diagnoses:  Cerebrovascular accident (CVA), unspecified mechanism [facility])    Rx / [facility] Orders ED Discharge Orders    None       Ollen Gross 06/22/20 1548    Quintella Reichert, MD 06/22/20 [phone]

## 2020-06-22 NOTE — Progress Notes (Addendum)
PHARMACIST CODE STROKE RESPONSE  Notified to mix tPA at 1455 by Dr. Cheral Marker Delivered tPA to RN at 1500  tPA dose = 6.3mg  bolus over 1 minute followed by 56mg  for a total dose of 62.3mg  over 1 hour  Issues/delays encountered (if applicable):   Duanne Limerick PharmD. BCPS  06/22/20 3:02 PM

## 2020-06-23 ENCOUNTER — Encounter (HOSPITAL_COMMUNITY): Payer: Self-pay | Admitting: Radiology

## 2020-06-23 ENCOUNTER — Inpatient Hospital Stay (HOSPITAL_COMMUNITY): Payer: PPO

## 2020-06-23 DIAGNOSIS — I6389 Other cerebral infarction: Secondary | ICD-10-CM

## 2020-06-23 LAB — ECHOCARDIOGRAM COMPLETE
Area-P 1/2: 3.76 cm2
Calc EF: 66.4 %
Height: 67 in
S' Lateral: 2.7 cm
Single Plane A2C EF: 60 %
Single Plane A4C EF: 70.6 %
Weight: 2440.93 oz

## 2020-06-23 LAB — CBC
HCT: 40.7 % (ref 39.0–52.0)
Hemoglobin: 13.5 g/dL (ref 13.0–17.0)
MCH: 30.1 pg (ref 26.0–34.0)
MCHC: 33.2 g/dL (ref 30.0–36.0)
MCV: 90.6 fL (ref 80.0–100.0)
Platelets: 178 10*3/uL (ref 150–400)
RBC: 4.49 MIL/uL (ref 4.22–5.81)
RDW: 12.9 % (ref 11.5–15.5)
WBC: 9.8 10*3/uL (ref 4.0–10.5)
nRBC: 0 % (ref 0.0–0.2)

## 2020-06-23 LAB — BASIC METABOLIC PANEL
Anion gap: 12 (ref 5–15)
BUN: 18 mg/dL (ref 8–23)
CO2: 20 mmol/L — ABNORMAL LOW (ref 22–32)
Calcium: 8.9 mg/dL (ref 8.9–10.3)
Chloride: 107 mmol/L (ref 98–111)
Creatinine, Ser: 0.78 mg/dL (ref 0.61–1.24)
GFR calc Af Amer: >60 mL/min (ref >60)
GFR calc non Af Amer: >60 mL/min (ref >60)
Glucose, Bld: 96 mg/dL (ref 70–99)
Potassium: 4.1 mmol/L (ref 3.5–5.1)
Sodium: 139 mmol/L (ref 135–145)

## 2020-06-23 LAB — LIPID PANEL
Cholesterol: 174 mg/dL (ref 0–200)
HDL: 59 mg/dL (ref >40)
LDL Cholesterol: 108 mg/dL — ABNORMAL HIGH (ref 0–99)
Total CHOL/HDL Ratio: 2.9 RATIO
Triglycerides: 35 mg/dL (ref <150)
VLDL: 7 mg/dL (ref 0–40)

## 2020-06-23 LAB — HEMOGLOBIN A1C
Hgb A1c MFr Bld: 5.5 % (ref 4.8–5.6)
Mean Plasma Glucose: 111.15 mg/dL

## 2020-06-23 MED ORDER — ATORVASTATIN CALCIUM 80 MG PO TABS: 80.0000 mg | ORAL_TABLET | Freq: Every day | ORAL | 2 refills | Status: DC

## 2020-06-23 MED ORDER — PANTOPRAZOLE SODIUM 40 MG PO TBEC: 40.0000 mg | DELAYED_RELEASE_TABLET | Freq: Every day | ORAL | Status: DC

## 2020-06-23 MED ORDER — ATORVASTATIN CALCIUM 80 MG PO TABS
80.0000 mg | ORAL_TABLET | Freq: Every day | ORAL | Status: DC
  Administered 2020-06-23: 80 mg via ORAL
  Filled 2020-06-23: qty 1

## 2020-06-23 MED ORDER — APIXABAN 5 MG PO TABS: 5.0000 mg | ORAL_TABLET | Freq: Two times a day (BID) | ORAL | 2 refills | Status: DC

## 2020-06-23 MED ORDER — APIXABAN 5 MG PO TABS
5.0000 mg | ORAL_TABLET | Freq: Two times a day (BID) | ORAL | Status: DC
  Administered 2020-06-23: 5 mg via ORAL
  Filled 2020-06-23: qty 1

## 2020-06-23 MED ORDER — TAMSULOSIN HCL 0.4 MG PO CAPS
0.4000 mg | ORAL_CAPSULE | Freq: Every day | ORAL | Status: DC
  Administered 2020-06-23: 0.4 mg via ORAL
  Filled 2020-06-23: qty 1

## 2020-06-23 MED ORDER — CHLORHEXIDINE GLUCONATE CLOTH 2 % EX PADS: 6.0000 | MEDICATED_PAD | Freq: Every day | CUTANEOUS | Status: DC

## 2020-06-23 NOTE — Progress Notes (Signed)
  Echocardiogram 2D Echocardiogram has been performed.  William Underwood 06/23/2020, 11:54 AM

## 2020-06-23 NOTE — Progress Notes (Signed)
ANTICOAGULATION CONSULT NOTE - Initial Consult  Pharmacy Consult for apixaban Indication: atrial fibrillation  Allergies  Allergen Reactions  . Cefdinir Palpitations and Rash  . Codeine Other (See Comments)    Sensitive to this = agitation    Patient Measurements: Height: 5\' 7"  (170.2 cm) Weight: 69.2 kg (152 lb 8.9 oz) IBW/kg (Calculated) : 66.1  Vital Signs: Temp: 98.4 F (36.9 C) (08/28 1200) Temp Source: Oral (08/28 1200) BP: 123/76 (08/28 1400) Pulse Rate: 56 (08/28 1300)  Labs: Recent Labs    06/22/20 1252 06/22/20 1252 06/22/20 1258 06/22/20 1744 06/23/20 0855  HGB 16.3   < > 16.7  --  13.5  HCT 49.1  --  49.0  --  40.7  PLT 227  --   --   --  178  LABPROT 11.9  --   --   --   --   INR 0.9  --   --   --   --   CREATININE 1.05  --  0.90  --  0.78  CKTOTAL 63  --   --   --   --   TROPONINIHS 6  --   --  15  --    < > = values in this interval not displayed.    Estimated Creatinine Clearance: 81.5 mL/min (by C-G formula based on SCr of 0.78 mg/dL).   Medical History: Past Medical History:  Diagnosis Date  . Benign prostatic hyperplasia without urinary obstruction 06/17/2014  . BPPV (benign paroxysmal positional vertigo) 07/01/2011  . Chronic hyperkalemia 05/20/2012   Overview:  Hx of for years  . Concussion syndrome 05/15/2011   This seems like a moderately severe concussion with at least 1 minute loss of consciousness in 15-20 minutes of significant confusion including being unable to state his name   . DEGENERATIVE JOINT DISEASE, HANDS 10/16/2009   Qualifier: Diagnosis of  By: [name], [name]    . Diverticulosis 06/20/2017  . Hemorrhoids, external 06/20/2017  . Lumbar spondylosis 06/20/2017   Fairly significant L4-L5 facet arthropathy with effusion.  Can consider facet injections and potentially RFA down the road.  Marland Kitchen NEUROPATHY 03/01/2008   Qualifier: Diagnosis of  By: [name], [name]    . Paroxysmal atrial fibrillation (HCC)   . Pulmonary insufficiency  03/09/2018   moderate PI noted on echo 03/09/18  . Pulmonary vein stenosis    left inferior pulmonary vein stenosis  . Right inguinal hernia 06/17/2014  . Sinus bradycardia   . Strain of gluteus medius 01/29/2017  . Vertigo     Medications:  Scheduled:  .  stroke: mapping our early stages of recovery book   Does not apply Once  . atorvastatin  80 mg Oral Daily  . Chlorhexidine Gluconate Cloth  6 each Topical Daily  . pantoprazole  40 mg Oral QHS  . tamsulosin  0.4 mg Oral Daily    Assessment: 41 yom with hx of pAFib s/p ablation and TIA - found to have a stroke and received tPA on 8/27@1515 . Was on Xarelto in the past per notes but stopped in 2020.   Neurology ordering to start apixaban. Hgb 13.5, plt 178. No s/sx of bleeding. Given age, wt, and Scr, qualifies for full dose apixaban.   Goal of Therapy:  Monitor platelets by anticoagulation protocol: Yes   Plan:  Start apixaban 5 mg BID tonight Monitor CBC, s/sx of bleeding Will educate prior to discharge  Antonietta Jewel, PharmD, [facility] Pharmacist  Phone: [phone] 06/23/2020 4:18 PM  Please check AMION for all [facility] phone numbers After 10:00 PM, call [facility] [phone]

## 2020-06-23 NOTE — Progress Notes (Addendum)
AVS printed and provided to patient and s/o who is at bedside to take patient home. All LDA removed and belongings returned. Patient and s/o questions and concerns answered and are now satisfied. First dose of eliquis given and physical prescription provided.  Candy Sledge, RN

## 2020-06-23 NOTE — Evaluation (Signed)
Physical Therapy Evaluation Patient Details Name: William Underwood MRN: [id number] DOB: [birth year] Today's Date: 06/23/2020   History of Present Illness  69 y.o. male with a PMHx of BPPV, paroxysmal atrial fibrillation s/p ablation and pulmonary vein stenosis who presented to the ED early this afternoon with a c/c of sudden onset numbness on his right side while riding his indoor bike. He did report a history of TIA. After he arrived, he stated that his symptoms seemed to be resolving. He had no drift but his grip was weaker on the right. His speech was clear and he was alert and fully oriented. His RN checked on him at 2 PM and he was normal. Subsequently, the EDP went to place an IV and at that time the patient was exhibiting slurred speech, had double vision, and was unsteady on his feet. A Code Stroke was called and he was brought emergently to CT. CT revealed no acute hemorrhage. Pt received IV tPA.  Clinical Impression  Pt presents to PT with deficits in functional mobility, gait, balance, endurance, power, safety awareness, and vision. Pt is unsteady during ambulation with no improvement noted utilizing UE support of RW. Pt drifts laterally throughout ambulation and requires assistance at all times to reduce falls risk. Pt demonstrates impaired awareness of obstacles present in hallway, difficult to determine if this is due to vision deficits or attention. Pt will benefit from continued acute PT POC to improve mobility quality and to aide in restoring independence. PT recommends discharge home with outpatient PT and assistance for all OOB mobility from significant other. PT will assess DME needs further at next session.    Follow Up Recommendations Outpatient PT;Supervision for mobility/OOB    Equipment Recommendations   (TBD, may benefit from cane or RW)    Recommendations for Other Services       Precautions / Restrictions Precautions Precautions: Fall Restrictions Weight Bearing  Restrictions: No      Mobility  Bed Mobility Overal bed mobility: Needs Assistance Bed Mobility: Supine to Sit     Supine to sit: Supervision        Transfers Overall transfer level: Needs assistance Equipment used: None Transfers: Sit to/from Stand Sit to Stand: Min guard            Ambulation/Gait Ambulation/Gait assistance: Min guard;Min assist Gait Distance (Feet): 120 Feet Assistive device: Rolling walker (2 wheeled);None Gait Pattern/deviations: Step-through pattern;Decreased stance time - right;Drifts right/left Gait velocity: reduced Gait velocity interpretation: <1.8 ft/sec, indicate of risk for recurrent falls General Gait Details: pt with reduced stance time on RLE due to pain, drifts laterally, poor device management with 60' ambulation with RW  Stairs            Wheelchair Mobility    Modified Rankin (Stroke Patients Only) Modified Rankin (Stroke Patients Only) Pre-Morbid Rankin Score: No symptoms Modified Rankin: Moderately severe disability     Balance Overall balance assessment: Needs assistance Sitting-balance support: No upper extremity supported;Feet supported Sitting balance-Leahy Scale: Good Sitting balance - Comments: supervision   Standing balance support: No upper extremity supported Standing balance-Leahy Scale: Poor Standing balance comment: minG without UE support, increased sway                             Pertinent Vitals/Pain Pain Assessment: Faces Pain Score: 6  Pain Location: R knee (increased pain with WBing) Pain Descriptors / Indicators: Aching Pain Intervention(s): Monitored during session    Home  Living Family/patient expects to be discharged to:: Private residence Living Arrangements: Alone Available Help at Discharge: Other (Comment);Available 24 hours/day (significant other) Type of Home: House Home Access: Level entry     Home Layout: Two level;Able to live on main level with  bedroom/bathroom Home Equipment: None      Prior Function Level of Independence: Independent         Comments: runs and bikes daily     Hand Dominance        Extremity/Trunk Assessment   Upper Extremity Assessment Upper Extremity Assessment: RUE deficits/detail RUE Sensation: decreased light touch    Lower Extremity Assessment Lower Extremity Assessment: Overall WFL for tasks assessed (reports pain in R knee)    Cervical / Trunk Assessment Cervical / Trunk Assessment: Normal  Communication   Communication: No difficulties  Cognition Arousal/Alertness: Awake/alert Behavior During Therapy: Impulsive Overall Cognitive Status: Impaired/Different from baseline Area of Impairment: Safety/judgement                         Safety/Judgement: Decreased awareness of deficits            General Comments General comments (skin integrity, edema, etc.): VSS on RA, dysconjugate gaze noted without use of eye patch, diplopia reported without use of eye patch, improved with eye patch    Exercises     Assessment/Plan    PT Assessment Patient needs continued PT services  PT Problem List Decreased activity tolerance;Decreased balance;Decreased mobility;Decreased cognition;Decreased knowledge of use of DME;Decreased safety awareness;Decreased knowledge of precautions;Impaired sensation;Pain       PT Treatment Interventions DME instruction;Gait training;Stair training;Functional mobility training;Therapeutic activities;Therapeutic exercise;Balance training;Neuromuscular re-education;Patient/family education    PT Goals (Current goals can be found in the Care Plan section)  Acute Rehab PT Goals Patient Stated Goal: To return to prior level of function PT Goal Formulation: With patient Time For Goal Achievement: 07/07/20 Potential to Achieve Goals: Good    Frequency Min 4X/week   Barriers to discharge        Co-evaluation               AM-PAC PT "6  Clicks" Mobility  Outcome Measure Help needed turning from your back to your side while in a flat bed without using bedrails?: None Help needed moving from lying on your back to sitting on the side of a flat bed without using bedrails?: None Help needed moving to and from a bed to a chair (including a wheelchair)?: A Little Help needed standing up from a chair using your arms (e.g., wheelchair or bedside chair)?: A Little Help needed to walk in hospital room?: A Little Help needed climbing 3-5 steps with a railing? : A Lot 6 Click Score: 19    End of Session Equipment Utilized During Treatment: Gait belt Activity Tolerance: Patient tolerated treatment well Patient left: in chair;with call bell/phone within reach;with family/visitor present Nurse Communication: Mobility status PT Visit Diagnosis: Unsteadiness on feet (R26.81);Other abnormalities of gait and mobility (R26.89)    Time: 9163-8466 PT Time Calculation (min) (ACUTE ONLY): 29 min   Charges:   PT Evaluation $PT Eval Moderate Complexity: 1 Mod PT Treatments $Gait Training: 8-22 mins        Zenaida Niece, PT, DPT Acute Rehabilitation Pager: [phone]   Zenaida Niece 06/23/2020, 2:34 PM

## 2020-06-23 NOTE — Progress Notes (Signed)
STROKE TEAM PROGRESS NOTE   HISTORY OF PRESENT ILLNESS (per record) William Underwood is an 69 y.o. male with a PMHx of BPPV, paroxysmal atrial fibrillation s/p ablation and pulmonary vein stenosis who presented to the ED early this afternoon with a c/c of sudden onset numbness on his right side while riding his indoor bike. He did report a history of TIA. After he arrived, he stated that his symptoms seemed to be resolving. He had no drift but his grip was weaker on the right. His speech was clear and he was alert and fully oriented. His RN checked on him at 2 PM and he was normal. Subsequently, the EDP went to place an IV and at that time the patient was exhibiting slurred speech, had double vision, and was unsteady on his feet. A Code Stroke was called and he was brought emergently to CT. CT revealed no acute hemorrhage. CTA was then obtained.  His home medications include ASA 81 mg po qd.  LSN: 2:00 PM tPA Given: Yes   INTERVAL HISTORY His girlfriend is at the bedside. Speech improved. He has some weakness in his right arm. His speech is much better. Still with tingling in the lips. Still with right eye 3rd. Girlriend at bedside. He had a TIA last summer. Hasn't been on Xarelto since 2020.He is on baby aspirin. Did not do well on Eliquis.     OBJECTIVE Vitals:   06/23/20 0400 06/23/20 0500 06/23/20 0600 06/23/20 0700  BP: 127/66 (!) 145/99 106/69 111/73  Pulse: (!) 57 76 (!) 56 (!) 55  Resp: 15 (!) 21 13 13   Temp: 97.7 F (36.5 C)     TempSrc: Oral     SpO2: 96% 97% 93% 95%  Weight:      Height:        CBC:  Recent Labs  Lab 06/22/20 1252 06/22/20 1258  WBC 12.7*  --   NEUTROABS 10.3*  --   HGB 16.3 16.7  HCT 49.1 49.0  MCV 92.8  --   PLT 227  --     Basic Metabolic Panel:  Recent Labs  Lab 06/22/20 1252 06/22/20 1258  NA 139 141  K 4.0 4.0  CL 104 107  CO2 21*  --   GLUCOSE 117* 115*  BUN 25* 28*  CREATININE 1.05 0.90  CALCIUM 9.8  --   MG 2.0  --     Lipid  Panel:     Component Value Date/Time   CHOL 174 06/23/2020 0338   TRIG 35 06/23/2020 0338   HDL 59 06/23/2020 0338   CHOLHDL 2.9 06/23/2020 0338   VLDL 7 06/23/2020 0338   LDLCALC 108 (H) 06/23/2020 0338   HgbA1c:  Lab Results  Component Value Date   HGBA1C 5.5 06/23/2020   Urine Drug Screen:     Component Value Date/Time   LABOPIA NONE DETECTED 06/22/2020 2038   COCAINSCRNUR NONE DETECTED 06/22/2020 2038   LABBENZ NONE DETECTED 06/22/2020 2038   AMPHETMU NONE DETECTED 06/22/2020 2038   THCU NONE DETECTED 06/22/2020 2038   LABBARB NONE DETECTED 06/22/2020 2038    Alcohol Level     Component Value Date/Time   ETH <10 06/22/2020 1744    IMAGING  CT Angio Head W or Wo Contrast CT Angio Neck W and/or Wo Contrast 06/22/2020 IMPRESSION:  1. Negative for intracranial large vessel occlusion. No significant intracranial stenosis. Normal basilar.  2. Mild atherosclerotic disease left carotid bifurcation without stenosis.  3. Right carotid normal.  Both vertebral arteries normal.   DG Chest 2 View 06/22/2020 IMPRESSION:  Presumed left-sided bronchial stents. No edema or airspace opacity. Heart size normal. No adenopathy.   CT HEAD WO CONTRAST 06/22/2020 IMPRESSION:  Normal head CT.  Normal head CT.   Transthoracic Echocardiogram  00/00/2021 Pending  ECG - SB rate 59 BPM. (See cardiology reading for complete details)   PHYSICAL EXAM Blood pressure 111/73, pulse (!) 55, temperature 97.7 F (36.5 C), temperature source Oral, resp. rate 13, height 5\' 7"  (1.702 m), weight 69.2 kg, SpO2 95 %.    Examination: This is an elderly patient, awake and alert, oriented x4, normocephalic, atraumatic, clear lungs, regular rate and rhythm, speech is fluent with minimal dysarthria, comprehension, naming, repeat intact.  Pupils equally round and reactive to light,  right eye abduction impairment with left eye nystagmus.  His face appears symmetric, sensation intact, hearing intact to  voice, tongue midline, he has some mild right upper extremity 5- out of 5 otherwise 5 out of 5 strength, no pronator drift, reports right hand sensory changes and right face sensory changes however intact on examination, symmetrical reflexes, no Babinski, he does show ataxia on finger-to-nose and heel-to-shin bilaterally.      ASSESSMENT/PLAN [name] is a 69 y.o. male with history of BPPV, paroxysmal atrial fibrillation s/p ablation (ASA 81 mg QD PTA), pulmonary vein stenosis and TIA hx who presented to the ED early AM 06/22/20 with sudden onset numbness on his right side while riding his indoor bike. Symptoms were initially improving but nurse checked on him at 2:00 PM and pt had slurred speech, double vision, and was unsteady on his feet.   The patient received IV t-PA Friday 06/22/20 at 15:15.  Stroke: MRI pending - suspect embolic with hx of PA  Code Stroke CT Head - not ordered  CT head - normal   MRI head - pending  MRA head - not ordered  CTA H&N - essentially normal  CT Perfusion - not ordered  Carotid Doppler - CTA neck ordered - carotid dopplers not indicated.  2D Echo - pending  William Underwood 2 - negative  LDL - 108                            HgbA1c - 5.5  UDS - negative  VTE prophylaxis - SCDs Diet  Diet Order            Diet Heart Room service appropriate? Yes with Assist; Fluid consistency: Thin  Diet effective now                 aspirin 81 mg daily prior to admission, now on No antithrombotic s/p tPA  Patient will be counseled to be compliant with his antithrombotic medications  Ongoing aggressive stroke risk factor management  Therapy recommendations:  pending  Disposition:  Pending  Hypertension  Home BP meds: none   Current BP meds: Cleviprex prn  Blood pressure somewhat low at times . Permissive hypertension (OK if < 220/120) but gradually normalize in 5-7 days  . Long-term BP goal  normotensive  Hyperlipidemia  Home Lipid lowering medication: none   LDL 108, goal < 70  Current lipid lowering medication: none - will add Lipitor 80 mg daily  Continue statin at discharge  Other Stroke Risk Factors  Advanced age  Hx stroke/TIA  Paroxysmal atrial fibrillation s/p ablation  Other Active Problems  Code status - Full code  Mild bradycardia - 50's  Mild leukocytosis - WBC's - 12.7  (afebrile) CBC - pending  Hx of pulmonary vein stenosis - apparent stents noted on CXR  PAF hx with ablation - consider TEE and loop implant  High specific gravity on U/A > 1.046 with elevated BUN 28 - probably dehydrated -> NS IV at 75 cc / hr started yesterday x 24 hours - Bmet - pending  Hospital day # 1  Patient with a history of paroxysmal atrial fibrillation with symptoms that may localize to an INO in the MLF but also aphasia which would localize to a cortical location, will need MRI of the brain to further evaluate whether this is embolic with multiple sites of infarction or small vessel in the brainstem.  Personally examined patient and images, and have participated in and made any corrections needed to history, physical, neuro exam,assessment and plan as stated above.  I have personally obtained the history, evaluated lab date, reviewed imaging studies and agree with radiology interpretations.    Sarina Ill, MD Stroke Neurology  I spent 35 minutes of face-to-face and non-face-to-face time with patient. This included prechart review, lab review, study review, order entry, electronic health record documentation, patient education on the different diagnostic and therapeutic options, counseling and coordination of care, risks and benefits of management, compliance, or risk factor reduction   To contact Stroke Continuity provider, please refer to [email or web address] After hours, contact General Neurology

## 2020-06-23 NOTE — Discharge Summary (Signed)
Patient ID: gjon letarte   MRN: [id number]      DOB: [birth year]  Date of Admission: 06/22/2020 Date of Discharge: 06/23/2020  Attending Physician:  Garvin Fila, MD, Stroke MD Consultant(s):    none Patient's PCP:  [name], MD  DISCHARGE DIAGNOSIS:   Numerous areas of acute infarct. Multiple small infarcts are present in the posterior circulation including the pons and midbrain in the midline as well as small acute infarcts in the left frontal and parietal cortex. Small acute infarct left thalamus. Felt to be embolic secondary to paroxysmal atrial fibrillation.  Treated with tPA.  Paroxysmal atrial fibrillation history.  Eliquis anticoagulation therapy.  Dehydration  Mild bradycardia - 50's (athletic)  Mild leukocytosis - WBC's - 12.7->9.8 (afebrile)  Hx of pulmonary vein stenosis - apparent stents noted on CXR  Dyslipidemia  BPPV    Active Problems:   Stroke [facility])   Past Medical History:  Diagnosis Date  . Benign prostatic hyperplasia without urinary obstruction 06/17/2014  . BPPV (benign paroxysmal positional vertigo) 07/01/2011  . Chronic hyperkalemia 05/20/2012   Overview:  Hx of for years  . Concussion syndrome 05/15/2011   This seems like a moderately severe concussion with at least 1 minute loss of consciousness in 15-20 minutes of significant confusion including being unable to state his name   . DEGENERATIVE JOINT DISEASE, HANDS 10/16/2009   Qualifier: Diagnosis of  By: [name], [name]    . Diverticulosis 06/20/2017  . Hemorrhoids, external 06/20/2017  . Lumbar spondylosis 06/20/2017   Fairly significant L4-L5 facet arthropathy with effusion.  Can consider facet injections and potentially RFA down the road.  Marland Kitchen NEUROPATHY 03/01/2008   Qualifier: Diagnosis of  By: [name], [name]    . Paroxysmal atrial fibrillation (HCC)   . Pulmonary insufficiency 03/09/2018   moderate PI noted on echo 03/09/18  . Pulmonary vein stenosis    left inferior pulmonary  vein stenosis  . Right inguinal hernia 06/17/2014  . Sinus bradycardia   . Strain of gluteus medius 01/29/2017  . Vertigo    Past Surgical History:  Procedure Laterality Date  . ANKLE ARTHROSCOPY    . ATRIAL FIBRILLATION ABLATION N/A 05/25/2018   Procedure: ATRIAL FIBRILLATION ABLATION;  Surgeon: Thompson Grayer, MD;  Location: [place] CV LAB;  Service: Cardiovascular;  Laterality: N/A;  . KNEE ARTHROSCOPY      Family History Family History  Problem Relation Age of Onset  . Hearing loss Father   . Colon cancer Neg Hx     Social History  reports that he has never smoked. He has never used smokeless tobacco. He reports that he does not drink alcohol and does not use drugs.  Allergies as of 06/23/2020      Reactions   Cefdinir Palpitations, Rash   Codeine Other (See Comments)   Sensitive to this = agitation      Medication List    STOP taking these medications   aspirin EC 81 MG tablet   BABY ASPIRIN PO     TAKE these medications   apixaban 5 MG Tabs tablet Commonly known as: ELIQUIS Take 1 tablet (5 mg total) by mouth 2 (two) times daily.   atorvastatin 80 MG tablet Commonly known as: LIPITOR Take 1 tablet (80 mg total) by mouth daily.   diazepam 5 MG tablet Commonly known as: VALIUM Take 1 tablet (5 mg total) by mouth daily as needed for anxiety. What changed: reasons to take this  multivitamin with minerals tablet Take 1 tablet by mouth daily.   NON FORMULARY Take 1 capsule by mouth See admin instructions. Relief Factor- Take 1 capsule by mouth two to three times a day   traMADol 50 MG tablet Commonly known as: ULTRAM TAKE 1 TABLET (50 MG TOTAL) BY MOUTH EVERY 6 (SIX) HOURS AS NEEDED FOR MODERATE PAIN.   Vitamin D (Ergocalciferol) 50 MCG (2000 UT) Caps Take 2,000 Units by mouth daily.       HOME MEDICATIONS PRIOR TO ADMISSION Medications Prior to Admission  Medication Sig Dispense Refill  . aspirin EC 81 MG tablet Take 81 mg by mouth 2 (two) times  daily. Swallow whole.    . diazepam (VALIUM) 5 MG tablet Take 1 tablet (5 mg total) by mouth daily as needed for anxiety. (Patient taking differently: Take 5 mg by mouth daily as needed for anxiety (or tinnitus relief). ) 30 tablet 1  . NON FORMULARY Take 1 capsule by mouth See admin instructions. Relief Factor- Take 1 capsule by mouth two to three times a day     . traMADol (ULTRAM) 50 MG tablet TAKE 1 TABLET (50 MG TOTAL) BY MOUTH EVERY 6 (SIX) HOURS AS NEEDED FOR MODERATE PAIN. 30 tablet 5  . BABY ASPIRIN PO Take 81 mg by mouth daily. (Patient not taking: Reported on 06/22/2020)    . Multiple Vitamins-Minerals (MULTIVITAMIN WITH MINERALS) tablet Take 1 tablet by mouth daily.  (Patient not taking: Reported on 06/22/2020)    . Vitamin D, Ergocalciferol, 2000 units CAPS Take 2,000 Units by mouth daily.  (Patient not taking: Reported on 06/22/2020)       HOSPITAL MEDICATIONS .  stroke: mapping our early stages of recovery book   Does not apply Once  . apixaban  5 mg Oral BID  . atorvastatin  80 mg Oral Daily  . Chlorhexidine Gluconate Cloth  6 each Topical Daily  . pantoprazole  40 mg Oral QHS  . tamsulosin  0.4 mg Oral Daily    LABORATORY STUDIES CBC    Component Value Date/Time   WBC 9.8 06/23/2020 0855   RBC 4.49 06/23/2020 0855   HGB 13.5 06/23/2020 0855   HGB 14.8 07/07/2018 1435   HCT 40.7 06/23/2020 0855   HCT 43.2 07/07/2018 1435   PLT 178 06/23/2020 0855   PLT 235 07/07/2018 1435   MCV 90.6 06/23/2020 0855   MCV 89 07/07/2018 1435   MCH 30.1 06/23/2020 0855   MCHC 33.2 06/23/2020 0855   RDW 12.9 06/23/2020 0855   RDW 13.6 07/07/2018 1435   LYMPHSABS 1.4 06/22/2020 1252   LYMPHSABS 1.9 07/07/2018 1435   MONOABS 0.8 06/22/2020 1252   EOSABS 0.1 06/22/2020 1252   EOSABS 0.0 07/07/2018 1435   BASOSABS 0.1 06/22/2020 1252   BASOSABS 0.0 07/07/2018 1435   CMP    Component Value Date/Time   NA 139 06/23/2020 0855   NA 142 07/07/2018 1435   K 4.1 06/23/2020 0855   CL  107 06/23/2020 0855   CO2 20 (L) 06/23/2020 0855   GLUCOSE 96 06/23/2020 0855   BUN 18 06/23/2020 0855   BUN 20 07/07/2018 1435   CREATININE 0.78 06/23/2020 0855   CALCIUM 8.9 06/23/2020 0855   PROT 7.3 06/22/2020 1252   ALBUMIN 4.2 06/22/2020 1252   AST 27 06/22/2020 1252   ALT 21 06/22/2020 1252   ALKPHOS 43 06/22/2020 1252   BILITOT 0.7 06/22/2020 1252   GFRNONAA >60 06/23/2020 0855   GFRAA >60 06/23/2020 6314  COAGS Lab Results  Component Value Date   INR 0.9 06/22/2020   INR 1.1 04/17/2019   Lipid Panel    Component Value Date/Time   CHOL 174 06/23/2020 0338   TRIG 35 06/23/2020 0338   HDL 59 06/23/2020 0338   CHOLHDL 2.9 06/23/2020 0338   VLDL 7 06/23/2020 0338   LDLCALC 108 (H) 06/23/2020 0338   HgbA1C  Lab Results  Component Value Date   HGBA1C 5.5 06/23/2020   Urinalysis    Component Value Date/Time   COLORURINE YELLOW 06/22/2020 2038   APPEARANCEUR CLEAR 06/22/2020 2038   LABSPEC >1.046 (H) 06/22/2020 2038   PHURINE 5.0 06/22/2020 2038   GLUCOSEU NEGATIVE 06/22/2020 2038   HGBUR NEGATIVE 06/22/2020 2038   BILIRUBINUR NEGATIVE 06/22/2020 2038   BILIRUBINUR Negative 10/06/2018 0938   KETONESUR 20 (A) 06/22/2020 2038   PROTEINUR NEGATIVE 06/22/2020 2038   UROBILINOGEN 0.2 10/06/2018 0938   NITRITE NEGATIVE 06/22/2020 2038   LEUKOCYTESUR NEGATIVE 06/22/2020 2038   Urine Drug Screen     Component Value Date/Time   LABOPIA NONE DETECTED 06/22/2020 2038   COCAINSCRNUR NONE DETECTED 06/22/2020 2038   LABBENZ NONE DETECTED 06/22/2020 2038   AMPHETMU NONE DETECTED 06/22/2020 2038   THCU NONE DETECTED 06/22/2020 2038   LABBARB NONE DETECTED 06/22/2020 2038    Alcohol Level    Component Value Date/Time   ETH <10 06/22/2020 1744     SIGNIFICANT DIAGNOSTIC STUDIES  CT Angio Head W or Wo Contrast CT Angio Neck W and/or Wo Contrast 06/22/2020 IMPRESSION:  1. Negative for intracranial large vessel occlusion. No significant intracranial stenosis.  Normal basilar.  2. Mild atherosclerotic disease left carotid bifurcation without stenosis.  3. Right carotid normal.  Both vertebral arteries normal.   MRI WO Contrast  06/23/2020 IMPRESSION: Numerous areas of acute infarct. Multiple small infarcts are present in the posterior circulation including the pons and midbrain in the midline as well as small acute infarcts in the left frontal and parietal cortex. Small acute infarct left thalamus. Findings compatible with multiple emboli. No hemorrhage.  DG Chest 2 View 06/22/2020 IMPRESSION:  Presumed left-sided bronchial stents. No edema or airspace opacity. Heart size normal. No adenopathy.   CT HEAD WO CONTRAST 06/22/2020 IMPRESSION:  Normal head CT.  Normal head CT.   Transthoracic Echocardiogram  06/23/2020 IMPRESSIONS  1. Left ventricular ejection fraction, by estimation, is 60 to 65%. The  left ventricle has normal function. The left ventricle has no regional  wall motion abnormalities. There is moderate left ventricular hypertrophy.  Left ventricular diastolic  parameters are consistent with Grade I diastolic dysfunction (impaired  relaxation).  2. Right ventricular systolic function is normal. The right ventricular  size is normal.  3. Right atrial size was mildly dilated.  4. The mitral valve is normal in structure. Trivial mitral valve  regurgitation. No evidence of mitral stenosis.  5. The aortic valve is normal in structure. Aortic valve regurgitation is  trivial. Mild aortic valve sclerosis is present, with no evidence of  aortic valve stenosis.  6. The inferior vena cava is normal in size with greater than 50%  respiratory variability, suggesting right atrial pressure of 3 mmHg.  Conclusion(s)/Recommendation(s): No intracardiac source of embolism  detected on this transthoracic study. A transesophageal echocardiogram is  recommended to exclude cardiac source of embolism if clinically indicated.   ECG - SB  rate 59 BPM. (See cardiology reading for complete details)     HISTORY OF PRESENT ILLNESS (From Dr Yvetta Coder note 06/22/20)  Shara Blazing an 69 y.o.malewith a PMHx of BPPV, paroxysmal atrial fibrillation s/p ablation and pulmonary vein stenosiswho presented to the ED early this afternoon with a c/c of sudden onset numbness on his right side while riding his indoor bike. He did report a history of TIA. After he arrived, he stated that his symptoms seemed to be resolving. He had no drift but his grip was weaker on the right. His speech was clear and he was alert and fully oriented.His RN checked on him at 2 PM and he was normal. Subsequently, the EDP went to place an IV and at that time the patient was exhibiting slurred speech, had double vision, and was unsteady on his feet. A Code Stroke was called and he was brought emergently to CT. CT revealed no acute hemorrhage. CTA was then obtained.  His home medications include ASA 81 mg po qd. LSN:2:00 PM tPA Given:Yes  HOSPITAL COURSE Mr. LEOCADIO HEAL is a 69 y.o. male with history of BPPV, paroxysmal atrial fibrillation s/p ablation (ASA 81 mg QD PTA), pulmonary vein stenosis and TIA hxwho presented to the ED early AM 06/22/20 with sudden onset numbness on his right side while riding his indoor bike. Symptoms were initially improving but nurse checked on him at 2:00 PM and pt had slurred speech, double vision, and was unsteady on his feet.   The patient received IV t-PA Friday 06/22/20 at 15:15.  Stroke: MRI pending - suspect embolic with hx of PAF  Code Stroke CT Head - not ordered  CT head - normal   MRI head - Numerous areas of acute infarct. Multiple small infarcts are present in the posterior circulation including the pons and midbrain in the midline as well as small acute infarcts in the left frontal and parietal cortex. Small acute infarct left thalamus. Findings compatible with multiple emboli.  MRA head - not ordered  CTA  H&N - essentially normal  CT Perfusion - not ordered  Carotid Doppler - CTA neck ordered - carotid dopplers not indicated.  2D Echo - EF 60 - 65%. No cardiac source of emboli identified.   Sars Corona Virus 2 - negative  LDL - 108  HgbA1c - 5.5  UDS - negative  VTE prophylaxis - SCDs Diet  Heart healthy with thin liquids  aspirin 81 mg daily prior to admission, now on No antithrombotic s/p tPA  Patient will be counseled to be compliant with his antithrombotic medications  Ongoing aggressive stroke risk factor management  Therapy recommendations:  Outpatient physical therapy  Disposition:  Discharge to home  Hypertension  Home BP meds: none   Current BP meds: Cleviprex prn  Blood pressure somewhat low at times  Permissive hypertension (OK if < 220/120) but gradually normalize in 5-7 days   Long-term BP goal normotensive  Hyperlipidemia  Home Lipid lowering medication: none   LDL 108, goal < 70  Current lipid lowering medication: none - will add Lipitor 80 mg daily  Continue statin at discharge  Other Stroke Risk Factors  Advanced age  Hx stroke/TIA  Paroxysmal atrial fibrillation s/p ablation  Other Active Problems  Code status - Full code  Mild bradycardia - 50's (athletic)  Mild leukocytosis - WBC's - 12.7->9.8 (afebrile)  Hx of pulmonary vein stenosis - apparent stents noted on CXR  PAF hx with ablation - previously on Xarelto - per Dr Jaynee Eagles - start Eliquis  High specific gravity on U/A > 1.046 with elevated BUN 28 - probably dehydrated -> NS IV at 75 cc / hr started yesterday x 24 hours -> BUN 18    DISCHARGE EXAM Vitals:   06/23/20 1100  06/23/20 1200 06/23/20 1300 06/23/20 1400  BP: 105/70 129/68 111/62 123/76  Pulse: (!) 57 (!) 55 (!) 56   Resp: 16 14 20 20   Temp:  98.4 F (36.9 C)    TempSrc:  Oral    SpO2: 92% 95% 96%   Weight:      Height:       Examination: This is an elderly patient, awake and alert, oriented x4, normocephalic, atraumatic, clear lungs, regular rate and rhythm, speech is fluent with minimal dysarthria, comprehension, naming, repeat intact.  Pupils equally round and reactive to light,  right eye abduction impairment with left eye nystagmus.  His face appears symmetric, sensation intact, hearing intact to voice, tongue midline, he has some mild right upper extremity 5- out of 5 otherwise 5 out of 5 strength, no pronator drift, reports right hand sensory changes and right face sensory changes however intact on examination, symmetrical reflexes, no Babinski, he does show ataxia on finger-to-nose and heel-to-shin bilaterally.  DISCHARGE INSTRUCTIONS GIVEN TO THE PATIENT 1. Outpatient physical therapy will be arranged. 2. Drink more fluids. You were dehydrated. Dehydration is a risk factor for stroke. 3. Gradually increase your activity as tolerated over the next two weeks. Nothing strenuous. 4. Be sure to take medications as prescribed. 5. Fill printed Eliquis Rx so you will have a dose for Sunday morning.  DISCHARGE DIET   Diet Order            Diet Heart Room service appropriate? Yes with Assist; Fluid consistency: Thin  Diet effective now                liquids  DISCHARGE PLAN  Disposition: discharge to home  Eliquis (apixaban) daily for secondary stroke prevention.  Ongoing risk factor control by Primary Care Physician at time of discharge  Follow-up [name], MD in 2 weeks.  Follow-up in [facility] in 4 weeks, office to schedule an appointment.   Follow up Cardiology - [name]  Outpatient Physical Therapy  45 minutes were  spent preparing  discharge.  Mikey Bussing PA-C [facility] Pager [phone] 06/23/2020, 5:01 PM

## 2020-06-23 NOTE — Progress Notes (Signed)
SLP Cancellation Note  Patient Details Name: William Underwood MRN: [id number] DOB: 07-Jan-1951   Cancelled treatment:       Reason Eval/Treat Not Completed: SLP screened, no needs identified, will sign off. CT negative, MRI pending, per patient and significant other, initially had symptoms of slurring of speech and difficulty getting words out but currently only symptoms patient c/o is mild tingling in left side of face and right arm. Patient reported that last admission with TIA and current admission, he was exercising on bike. He also reports worsening light-headed feeling like he will faint when moving from sitting up to standing.   Sonia Baller, MA, CCC-SLP Speech Therapy [facility] Pager: [phone]

## 2020-06-25 ENCOUNTER — Telehealth: Payer: Self-pay | Admitting: Internal Medicine

## 2020-06-25 DIAGNOSIS — H532 Diplopia: Secondary | ICD-10-CM | POA: Diagnosis not present

## 2020-06-25 DIAGNOSIS — H5021 Vertical strabismus, right eye: Secondary | ICD-10-CM | POA: Diagnosis not present

## 2020-06-25 NOTE — Telephone Encounter (Signed)
Patient states on 06/22/20 he had a stroke and. Since the stroke he has been experiencing double vision on and off. Please call to discuss.

## 2020-06-25 NOTE — Telephone Encounter (Signed)
Called patient back- he states that he had a stroke on 08/27, and they have since put him on eliquis, and was told to follow up with his Cardiologist in 3 weeks, I did advise that he had an appointment with an APP on 9/16 but he would rather see an MD since he is following up regarding his stroke and the new medication. I was able to place him on with Dr.Acharya on 08/14 at 11:40- patient would like for me to send a message to her to see if she thinks he should be seen any sooner.  I did advise for him to notify the Neurologist regarding any stoke related issues, but he states this was regarding the new medication he is on as well.   Patient verbalized understanding, thankful for call back.

## 2020-06-26 ENCOUNTER — Telehealth: Payer: Self-pay

## 2020-06-26 ENCOUNTER — Telehealth: Payer: Self-pay | Admitting: Family Medicine

## 2020-06-26 NOTE — Progress Notes (Signed)
°  Chronic Care Management   Outreach Note  06/26/2020 Name: William Underwood MRN: [id number] DOB: [birth year]  Referred by: [name], MD Reason for referral : No chief complaint on file.   An unsuccessful telephone outreach was attempted today. The patient was referred to the pharmacist for assistance with care management and care coordination.   Follow Up Plan:   Earney [place] Upstream Scheduler

## 2020-06-26 NOTE — Telephone Encounter (Cosign Needed)
Transition Care Management Follow-up Telephone Call  Date of discharge and from where: 06/23/20 from [facility]  How have you been since you were released from the hospital? A little better but have double vision and arms still feel heavy  Any questions or concerns? Just concerned if another stroke will happen , pt stated he is just still and resting  Items Reviewed:  Did the pt receive and understand the discharge instructions provided? Yes   Medications obtained and verified? Yes   Any new allergies since your discharge? No   Dietary orders reviewed? Yes  Do you have support at home? Yes   Functional Questionnaire: (I = Independent and D = Dependent) ADLs: I  Bathing/Dressing- I  Meal Prep- I  Eating- I  Maintaining continence- I  Transferring/Ambulation- I  Managing Meds- I Pt stated he has assistance if needed  Follow up appointments reviewed:   PCP Hospital f/u appt confirmed? Yes  Scheduled to see dr [name]  on Sept 8th  @ 11:20 am.  [facility] f/u appt confirmed? Yes  Scheduled to see dr Lilly Cove  on 07/10/20 @ 11:40 a.m.  Are transportation arrangements needed? No   If their condition worsens, is the pt aware to call PCP or go to the Emergency Dept.? Yes  Was the patient provided with contact information for the PCP's office or ED? Yes  Was to pt encouraged to call back with questions or concerns? Yes

## 2020-06-27 ENCOUNTER — Other Ambulatory Visit: Payer: Self-pay

## 2020-06-27 ENCOUNTER — Ambulatory Visit: Payer: PPO | Attending: Neurology | Admitting: Physical Therapy

## 2020-06-27 DIAGNOSIS — R42 Dizziness and giddiness: Secondary | ICD-10-CM | POA: Diagnosis not present

## 2020-06-27 DIAGNOSIS — M6281 Muscle weakness (generalized): Secondary | ICD-10-CM | POA: Diagnosis not present

## 2020-06-27 DIAGNOSIS — R2681 Unsteadiness on feet: Secondary | ICD-10-CM | POA: Diagnosis not present

## 2020-06-27 DIAGNOSIS — R2689 Other abnormalities of gait and mobility: Secondary | ICD-10-CM | POA: Insufficient documentation

## 2020-06-27 DIAGNOSIS — R262 Difficulty in walking, not elsewhere classified: Secondary | ICD-10-CM | POA: Insufficient documentation

## 2020-06-27 DIAGNOSIS — Z8679 Personal history of other diseases of the circulatory system: Secondary | ICD-10-CM

## 2020-06-27 DIAGNOSIS — R278 Other lack of coordination: Secondary | ICD-10-CM | POA: Diagnosis not present

## 2020-06-27 DIAGNOSIS — R41842 Visuospatial deficit: Secondary | ICD-10-CM | POA: Insufficient documentation

## 2020-06-27 NOTE — Therapy (Signed)
[facility] [address] [place], [place], [address] Phone: [phone]   Fax:  [phone]  Physical Therapy Evaluation  Patient Details  Name: William Underwood MRN: [id number] Date of Birth: 12-27-1950 Referring Provider (PT): Garvin Fila, MD   Encounter Date: 06/27/2020   PT End of Session - 06/27/20 1702    Visit Number 1    Number of Visits 17    Date for PT Re-Evaluation 09/25/20   written for 60 day POC   Authorization Type HTA 2021 - $15 CO PAY    PT Start Time 1401    PT Stop Time 1446    PT Time Calculation (min) 45 min    Equipment Utilized During Treatment Gait belt    Activity Tolerance Patient tolerated treatment well    Behavior During Therapy [facility] for tasks assessed/performed;Impulsive   pt verbose at times          Past Medical History:  Diagnosis Date  . Benign prostatic hyperplasia without urinary obstruction 06/17/2014  . BPPV (benign paroxysmal positional vertigo) 07/01/2011  . Chronic hyperkalemia 05/20/2012   Overview:  Hx of for years  . Concussion syndrome 05/15/2011   This seems like a moderately severe concussion with at least 1 minute loss of consciousness in 15-20 minutes of significant confusion including being unable to state his name   . DEGENERATIVE JOINT DISEASE, HANDS 10/16/2009   Qualifier: Diagnosis of  By: [name], [name]    . Diverticulosis 06/20/2017  . Hemorrhoids, external 06/20/2017  . Lumbar spondylosis 06/20/2017   Fairly significant L4-L5 facet arthropathy with effusion.  Can consider facet injections and potentially RFA down the road.  Marland Kitchen NEUROPATHY 03/01/2008   Qualifier: Diagnosis of  By: [name], [name]    . Paroxysmal atrial fibrillation (HCC)   . Pulmonary insufficiency 03/09/2018   moderate PI noted on echo 03/09/18  . Pulmonary vein stenosis    left inferior pulmonary vein stenosis  . Right inguinal hernia 06/17/2014  . Sinus bradycardia   . Strain of  gluteus medius 01/29/2017  . Vertigo     Past Surgical History:  Procedure Laterality Date  . ANKLE ARTHROSCOPY    . ATRIAL FIBRILLATION ABLATION N/A 05/25/2018   Procedure: ATRIAL FIBRILLATION ABLATION;  Surgeon: Thompson Grayer, MD;  Location: [facility];  Service: Cardiovascular;  Laterality: N/A;  . KNEE ARTHROSCOPY      There were no vitals filed for this visit.    Subjective Assessment - 06/27/20 1408    Subjective 69 y.o. male with a PMHx of BPPV, paroxysmal atrial fibrillation s/p ablation and pulmonary vein stenosis who presented to the ED on 06/22/20 after sudden onset numbness on his right side while riding his indoor bike. He did report a history of TIA. After he arrived, he stated that his symptoms seemed to be resolving.Marland Kitchen His speech was clear and he was alert and fully oriented.Later that day patient was exhibiting slurred speech, had double vision, and was unsteady on his feet. A Code Stroke was called and he was brought emergently to CT. CT revealed no acute hemorrhage. Pt received IV tPA. Discharged home 06/23/20. Main compliant now is double vision, especially when looking farther away, worse on L eye. Went to opthamologist on Monday, was given prism glasses and wears a patch sometimes over his L eye when reading. Ambulated in with a SPC. Speech has gotten much better, but still having some difficulty with word finding. Worse with balance  in the dark, has a hx of BPPV and vestibular impairments (has been seen here for this issue in the past). No falls since leaving the hospital. Using [facility] only in the community and furniture walks in the house.    Patient is accompained by: --   [name]   Pertinent History BPPV, paroxysmal atrial fibrillation s/p ablation and pulmonary vein stenosis, hx of concussion (2012)    Diagnostic tests Numerous areas of acute infarct. Multiple small infarcts are presentin the posterior circulation including the pons and midbrain in themidline as well as  small acute infarcts in the left frontal andparietal cortex. Small acute infarct left thalamus. Findingscompatible with multiple emboli. No hemorrhage.    Patient Stated Goals wants to be able to get back to road cycling.    Currently in Pain? No/denies              [facility] PT Assessment - 06/27/20 1422      Assessment   Medical Diagnosis CVA    Referring Provider (PT) Garvin Fila, MD    Onset Date/Surgical Date 06/23/20    Hand Dominance Right    Prior Therapy a few years ago at this location for BPPV      Precautions   Precautions Fall    Precaution Comments double vision, worse in L eye, has prism glasses      Balance Screen   Has the patient fallen in the past 6 months No    Has the patient had a decrease in activity level because of a fear of falling?  No    Is the patient reluctant to leave their home because of a fear of falling?  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    Leisure previously [place] - cyclist and runner       Sensation   Light Touch Appears Intact    Hot/Cold Appears Intact   per pt report   Additional Comments reports at times BUE feel heavy and has tingling sometimes      Coordination   Gross Motor Movements are Fluid and Coordinated Yes    Fine Motor Movements are Fluid and Coordinated No    Finger Nose Finger Test impaired due to double  vision, more difficulty performing with RUE       ROM / Strength   AROM / PROM / Strength Strength      Strength   Overall Strength Comments grossly 5/5 BLE myotomes    Strength Assessment Site Hip;Knee;Ankle    Right/Left Hip --      Transfers   Transfers Stand to Sit;Sit to Stand    Sit to Stand 5: Supervision;Without upper extremity assist;From bed    Five time sit to stand comments  14.81  seconds, one episode of BLE bracing against mat     Stand to Sit 5: Supervision;Without upper extremity assist;To bed      Ambulation/Gait   Ambulation/Gait Yes    Ambulation/Gait Assistance 4: Min guard;4: Min assist    Ambulation/Gait Assistance Details pt with tendency to hold SPC when ambulating with prism glasses, pt demonstrating improvements in gait without therapist having to make sure that pt did not bump into objects on his L. pt taking incr time when turning, discussed feeling more unsteady/dizzy    Assistive device Straight cane    Gait Pattern Step-through pattern    Ambulation Surface Level;Indoor    Gait velocity 12.5 seconds (holding SPC) = 2.62 ft/sec    Gait Comments pt ambulating in with SPC (not wearing eye patch or prism glasses), pt needing min A from therapist for balance and to prevent pt from bumping into objects on his L      Standardized Balance Assessment   Standardized Balance Assessment Timed Up and Go Test      Timed Up and Go Test   Normal TUG (seconds) 14.97   no AD                     Objective measurements completed on examination: See above findings.               PT Education - 06/27/20 1701    Education Details clinical findings, POC, fall risk, discussed beginning to use prism glasses during gait to help decr with fall risk (pt more steady while wearing them) and to use SPC even in the house as pt reports he is currently furniture walking    Person(s) Educated Patient   pt's gf   Methods Explanation    Comprehension Verbalized  understanding            PT Short Term Goals - 06/28/20 1403      PT SHORT TERM GOAL #1   Title Pt will be independent with initial HEP in order to build upon functional gains made in therapy. ALL STGS DUE 07/26/20    Time 4    Period Weeks    Status New    Target Date 07/26/20      PT SHORT TERM GOAL #2   Title Pt will undergo further assessment of DGI to assess fall risk - STG and LTG to be written as appropriate.    Time 4    Period Weeks    Status New      PT SHORT TERM GOAL #3   Title Pt will decr TUG time to 13.5 seconds or less with no AD in order to demo decr fall risk.    Baseline 14.97 no AD    Time 4    Period Weeks    Status New             PT Long Term Goals - 06/28/20 1405      PT LONG TERM GOAL #1   Title Pt will be independent with FINAL HEP in order to build upon functional gains made in therapy. ALL LTGS DUE 08/23/20    Time 8    Period Weeks    Status New    Target Date 08/23/20      PT LONG TERM GOAL #2   Title DGI goal to be written as appropriate to demo decr fall risk.    Time 8    Period Weeks    Status New  PT LONG TERM GOAL #3   Title Pt will improve gait speed with no AD vs. LRAD to at least 3.0 ft/sec in order to demo improved gait efficiency.    Time 8    Period Weeks    Status New      PT LONG TERM GOAL #4   Title Pt will be able to walk 1,000' outdoors over unlevel surfaces with supervision with no LOB in order to demo improved community mobility with no AD vs. LRAD.    Time 8    Period Weeks    Status New                  Plan - 06/28/20 1407    Clinical Impression Statement Patient is a 69 year old male referred to Neuro OPPT s/p CVA on 06/22/20. MRI found numerous areas of acute infarct. Multiple small infarcts are present in the posterior circulation including the pons and midbrain in the midline as well as small acute infarcts in the left frontal and parietal cortex. Small acute infarct left thalamus.  Findings compatible with multiple emboli. No hemorrhage. Pt's primary compliant is double vision, recently saw ophthalmologist and was given prism glasses.  Pt's PMH is significant for: BPPV, paroxysmal atrial fibrillation s/p ablation and pulmonary vein stenosis, hx of concussion (2012). The following deficits were present during the exam:  impaired balance, gait abnormalities (without prism glasses pt veering to L, needing min A from therapist), impaired sensation, decr coordination, decr functional BLE strength, decr safety awareness. Based on TUG with no AD pt is at an incr risk for falls. Pt's gait speed indicates pt is a limited community ambulator. Prior to CVA, pt was a very active cycler and runner Pt would benefit from skilled PT to address these impairments and functional limitations to maximize functional mobility independence    Personal Factors and Comorbidities Comorbidity 3+;Fitness;Past/Current Experience;Behavior Pattern   pt extremely active prior to stroke   Comorbidities BPPV, paroxysmal atrial fibrillation s/p ablation and pulmonary vein stenosis, hx of concussion (2012)    Examination-Activity Limitations Stand;Squat;Transfers;Locomotion Level;Stairs    Examination-Participation Restrictions Community Activity;Driving   running, cycling   Stability/Clinical Decision Making Evolving/Moderate complexity    Clinical Decision Making Moderate    Rehab Potential Good    PT Frequency 2x / week    PT Duration 8 weeks    PT Treatment/Interventions ADLs/Self Care Home Management;DME Instruction;Gait training;Stair training;Therapeutic activities;Functional mobility training;Therapeutic exercise;Neuromuscular re-education;Balance training;Patient/family education;Vestibular;Visual/perceptual remediation/compensation    PT Next Visit Plan perform DGI, initial HEP for balance/functional BLE strengthening, how are prism glasses going?    Recommended Other Services sent speech therapy referral      Consulted and Agree with Plan of Care Patient   pt's gf          Patient will benefit from skilled therapeutic intervention in order to improve the following deficits and impairments:  Abnormal gait, Decreased balance, Decreased coordination, Decreased endurance, Decreased safety awareness, Decreased knowledge of use of DME, Decreased strength, Dizziness, Difficulty walking, Impaired vision/preception, Impaired sensation  Visit Diagnosis: Unsteadiness on feet  Other abnormalities of gait and mobility  Muscle weakness (generalized)  Dizziness and giddiness  Difficulty in walking, not elsewhere classified     Problem List Patient Active Problem List   Diagnosis Date Noted  . Stroke ([place]) 06/22/2020  . Arthralgia 07/14/2019  . Pulmonary vein stenosis s/p stenting 2020 01/02/2019  . Chronic neck pain 08/23/2018  . Tinnitus 08/23/2018  . Lipoma 08/23/2018  .  Paroxysmal atrial fibrillation ([place]) 05/25/2018  . Diverticulosis 06/20/2017  . Hemorrhoids, external 06/20/2017  . Benign prostatic hyperplasia without urinary obstruction 06/17/2014  . BPPV (benign paroxysmal positional vertigo) 07/01/2011  . DEGENERATIVE JOINT DISEASE, HANDS 10/16/2009  . NEUROPATHY 03/01/2008    Arliss Journey, PT, DPT  06/28/2020, 2:15 PM  [facility] [address] [place], [place], [address] Phone: [phone]   Fax:  [phone]  Name: William Underwood MRN: [id number] Date of Birth: 08-24-1951

## 2020-06-27 NOTE — Telephone Encounter (Signed)
I think appt on 9/14 is appropriate, I am not in the office sooner than that date. Will be happy to see him then.   I strongly recommend follow up with EP given that this is primarily an EP/Afib related issue and their guidance will be invaluable. Please arrange follow up with EP or Afib clinic as well.   Dr. Rayann Heman cc'ed on this note.

## 2020-06-27 NOTE — Telephone Encounter (Signed)
Sent referral over to EP for Dr.Allred to be scheduled.   Will route to schedulers to make aware of this as well.  Thank you!

## 2020-06-28 ENCOUNTER — Telehealth: Payer: Self-pay | Admitting: Physical Therapy

## 2020-06-28 NOTE — Telephone Encounter (Signed)
Dr. Leonie Man, William Underwood was evaluated by Physical Therapy  on 06/27/20.  The patient would benefit from a speech therapy evaluation due to word finding difficulties and difficulties swallowing.    If you agree, please place an order in [facility] workque in [facility] or fax the order to [phone]. Thank you, William Underwood, PT, DPT 06/28/20 12:40 PM    [facility] [address] [place], [place]  [address] Phone:  [phone] Fax:  [phone]

## 2020-07-02 ENCOUNTER — Other Ambulatory Visit: Payer: Self-pay | Admitting: Neurology

## 2020-07-02 DIAGNOSIS — I69320 Aphasia following cerebral infarction: Secondary | ICD-10-CM

## 2020-07-02 NOTE — Telephone Encounter (Signed)
Ok will do so

## 2020-07-03 ENCOUNTER — Telehealth: Payer: Self-pay | Admitting: *Deleted

## 2020-07-03 NOTE — Telephone Encounter (Signed)
Per Dr Leonie Man, please offer pt appt tomorrow 9/8 at end of day, 3:30 pm. I called the pt and LVM asking for call back. If he calls back, please see if he will move his appt with Dr Leonie Man from 11/2 to 9/8 at 3:30 pm or if he cannot make it in time 4 pm will be fine.

## 2020-07-03 NOTE — Telephone Encounter (Signed)
Note appt has been changed to 3:30 pm tomorrow.

## 2020-07-04 ENCOUNTER — Encounter: Payer: Self-pay | Admitting: Internal Medicine

## 2020-07-04 ENCOUNTER — Encounter: Payer: Self-pay | Admitting: Neurology

## 2020-07-04 ENCOUNTER — Telehealth: Payer: Self-pay

## 2020-07-04 ENCOUNTER — Ambulatory Visit (INDEPENDENT_AMBULATORY_CARE_PROVIDER_SITE_OTHER): Payer: PPO | Admitting: Family Medicine

## 2020-07-04 ENCOUNTER — Other Ambulatory Visit: Payer: Self-pay

## 2020-07-04 ENCOUNTER — Ambulatory Visit: Payer: PPO | Admitting: Internal Medicine

## 2020-07-04 ENCOUNTER — Ambulatory Visit: Payer: PPO | Admitting: Neurology

## 2020-07-04 ENCOUNTER — Encounter: Payer: Self-pay | Admitting: Family Medicine

## 2020-07-04 VITALS — BP 114/72 | HR 51 | Ht 67.0 in | Wt 151.0 lb

## 2020-07-04 VITALS — BP 133/82 | HR 50 | Temp 97.9°F | Ht 67.0 in | Wt 151.0 lb

## 2020-07-04 VITALS — BP 130/79 | HR 61 | Ht 67.0 in | Wt 152.0 lb

## 2020-07-04 DIAGNOSIS — I480 Paroxysmal atrial fibrillation: Secondary | ICD-10-CM

## 2020-07-04 DIAGNOSIS — I6349 Cerebral infarction due to embolism of other cerebral artery: Secondary | ICD-10-CM | POA: Diagnosis not present

## 2020-07-04 DIAGNOSIS — H532 Diplopia: Secondary | ICD-10-CM | POA: Diagnosis not present

## 2020-07-04 DIAGNOSIS — Q268 Other congenital malformations of great veins: Secondary | ICD-10-CM

## 2020-07-04 DIAGNOSIS — I69398 Other sequelae of cerebral infarction: Secondary | ICD-10-CM

## 2020-07-04 DIAGNOSIS — R2689 Other abnormalities of gait and mobility: Secondary | ICD-10-CM | POA: Diagnosis not present

## 2020-07-04 DIAGNOSIS — I639 Cerebral infarction, unspecified: Secondary | ICD-10-CM

## 2020-07-04 NOTE — Patient Instructions (Signed)
I had a long discussion with the patient with regards to his recent by cerebral multiple embolic strokes from which she seems to be recovering well except for residual diplopia.  I agree with long-term anticoagulation with Eliquis given his history of paroxysmal A. fib even though he has been treated with ablation and been free from A. fib.  I recommend referral to Occupational Therapy to learn compensatory mechanisms for his diplopia.  Continue ongoing physical therapy as outpatient.  I advised him not to drive till his diplopia is corrected.  I advised him to increase his activity gradually as tolerated.  Maintain aggressive risk factor modification with strict control of hypertension with blood pressure goal below 130/90, lipids with LDL cholesterol goal below 70 mg percent and diabetes with hemoglobin A1c goal below 6.5%.  Plan to repeat lipid profile at follow-up visit and may consider reducing dose of Lipitor if LDL is way below goal.  Return for follow-up in 3 months or call earlier if necessary  Stroke Prevention Some medical conditions and behaviors are associated with a higher chance of having a stroke. You can help prevent a stroke by making nutrition, lifestyle, and other changes, including managing any medical conditions you may have. What nutrition changes can be made?   Eat healthy foods. You can do this by: ? Choosing foods high in fiber, such as fresh fruits and vegetables and whole grains. ? Eating at least 5 or more servings of fruits and vegetables a day. Try to fill half of your plate at each meal with fruits and vegetables. ? Choosing lean protein foods, such as lean cuts of meat, poultry without skin, fish, tofu, beans, and nuts. ? Eating low-fat dairy products. ? Avoiding foods that are high in salt (sodium). This can help lower blood pressure. ? Avoiding foods that have saturated fat, trans fat, and cholesterol. This can help prevent high cholesterol. ? Avoiding processed and  premade foods.  Follow your health care provider's specific guidelines for losing weight, controlling high blood pressure (hypertension), lowering high cholesterol, and managing diabetes. These may include: ? Reducing your daily calorie intake. ? Limiting your daily sodium intake to 1,500 milligrams (mg). ? Using only healthy fats for cooking, such as olive oil, canola oil, or sunflower oil. ? Counting your daily carbohydrate intake. What lifestyle changes can be made?  Maintain a healthy weight. Talk to your health care provider about your ideal weight.  Get at least 30 minutes of moderate physical activity at least 5 days a week. Moderate activity includes brisk walking, biking, and swimming.  Do not use any products that contain nicotine or tobacco, such as cigarettes and e-cigarettes. If you need help quitting, ask your health care provider. It may also be helpful to avoid exposure to secondhand smoke.  Limit alcohol intake to no more than 1 drink a day for nonpregnant women and 2 drinks a day for men. One drink equals 12 oz of beer, 5 oz of wine, or 1 oz of hard liquor.  Stop any illegal drug use.  Avoid taking birth control pills. Talk to your health care provider about the risks of taking birth control pills if: ? You are over 61 years old. ? You smoke. ? You get migraines. ? You have ever had a blood clot. What other changes can be made?  Manage your cholesterol levels. ? Eating a healthy diet is important for preventing high cholesterol. If cholesterol cannot be managed through diet alone, you may also need to  take medicines. ? Take any prescribed medicines to control your cholesterol as told by your health care provider.  Manage your diabetes. ? Eating a healthy diet and exercising regularly are important parts of managing your blood sugar. If your blood sugar cannot be managed through diet and exercise, you may need to take medicines. ? Take any prescribed medicines to  control your diabetes as told by your health care provider.  Control your hypertension. ? To reduce your risk of stroke, try to keep your blood pressure below 130/80. ? Eating a healthy diet and exercising regularly are an important part of controlling your blood pressure. If your blood pressure cannot be managed through diet and exercise, you may need to take medicines. ? Take any prescribed medicines to control hypertension as told by your health care provider. ? Ask your health care provider if you should monitor your blood pressure at home. ? Have your blood pressure checked every year, even if your blood pressure is normal. Blood pressure increases with age and some medical conditions.  Get evaluated for sleep disorders (sleep apnea). Talk to your health care provider about getting a sleep evaluation if you snore a lot or have excessive sleepiness.  Take over-the-counter and prescription medicines only as told by your health care provider. Aspirin or blood thinners (antiplatelets or anticoagulants) may be recommended to reduce your risk of forming blood clots that can lead to stroke.  Make sure that any other medical conditions you have, such as atrial fibrillation or atherosclerosis, are managed. What are the warning signs of a stroke? The warning signs of a stroke can be easily remembered as BEFAST.  B is for balance. Signs include: ? Dizziness. ? Loss of balance or coordination. ? Sudden trouble walking.  E is for eyes. Signs include: ? A sudden change in vision. ? Trouble seeing.  F is for face. Signs include: ? Sudden weakness or numbness of the face. ? The face or eyelid drooping to one side.  A is for arms. Signs include: ? Sudden weakness or numbness of the arm, usually on one side of the body.  S is for speech. Signs include: ? Trouble speaking (aphasia). ? Trouble understanding.  T is for time. ? These symptoms may represent a serious problem that is an emergency.  Do not wait to see if the symptoms will go away. Get medical help right away. Call your local emergency services (911 in the [place] Do not drive yourself to the hospital.  Other signs of stroke may include: ? A sudden, severe headache with no known cause. ? Nausea or vomiting. ? Seizure. Where to find more information For more information, visit:  American Stroke Association: www.strokeassociation.org  National Stroke Association: www.stroke.org Summary  You can prevent a stroke by eating healthy, exercising, not smoking, limiting alcohol intake, and managing any medical conditions you may have.  Do not use any products that contain nicotine or tobacco, such as cigarettes and e-cigarettes. If you need help quitting, ask your health care provider. It may also be helpful to avoid exposure to secondhand smoke.  Remember BEFAST for warning signs of stroke. Get help right away if you or a loved one has any of these signs. This information is not intended to replace advice given to you by your health care provider. Make sure you discuss any questions you have with your health care provider. Document Revised: 09/25/2017 Document Reviewed: 11/18/2016 Elsevier Patient Education  2020 [employer].

## 2020-07-04 NOTE — Telephone Encounter (Signed)
Attempted to call pt, LVM  Called to verify appt time

## 2020-07-04 NOTE — Assessment & Plan Note (Addendum)
Likely embolic based on distribution and numbness in setting of paroxysmal atrial fibrillation.  He will be following up with neurology later today.  Still has some residual imbalance and double vision but they seem to be improving.  Discussed natural course of recovery.  I am optimistic that he will regain some of his vision.  He will continue Eliquis.  He will also continue Lipitor.

## 2020-07-04 NOTE — Progress Notes (Signed)
Chief Complaint:  William Underwood is a 69 y.o. male who presents today for a TCM visit.  Assessment/Plan:  Chronic Problems Addressed Today: Stroke [facility]) Likely embolic based on distribution and numbness in setting of paroxysmal atrial fibrillation.  He will be following up with neurology later today.  Still has some residual imbalance and double vision but they seem to be improving.  Discussed natural course of recovery.  I am optimistic that he will regain some of his vision.  He will continue Eliquis.  He will also continue Lipitor.  Paroxysmal atrial fibrillation (HCC) Regular rate and rhythm today.  Continue Eliquis.     Subjective:  HPI:  Summary of Hospital admission: Reason for admission: CVA Date of admission: 06/22/20 Date of discharge: 06/23/20 Date of Interactive contact: 06/26/20 Summary of Hospital course: Patient presented to the ED on 06/22/2020 with sudden onset numbness and paralysis on right side. While in the ED patient started showing signs of slurred speech, double vision, and unsteadiness on his feet. Code stroke was called. CT scan showed no hemorrhage and patient was started on TPA. MRI was performed which showed multiple small infarcts in posterior circulation including pons and midbrain and thalamus. Etiology thought to be  embolism secondary to paroxysmal atrial fibrillation. He was discharged from the hospital the next day in stable condition.   Interim history outlined by problem:  He has had some improvement since coming home about a week ago.  Balance is getting better.  Still has some double vision.  Recently saw optometrist and had prism placed in glasses which is helped.  He has a follow-up appointment with neurology later this afternoon.  No further episodes of neurologic deficits.  He has been tolerating his Eliquis well.  He was also started on Lipitor and seems to be tolerating it well as well.  ROS: Per HPI, otherwise a complete review of systems  was negative.   PMH:  The following were reviewed and entered/updated in epic: Past Medical History:  Diagnosis Date  . Benign prostatic hyperplasia without urinary obstruction 06/17/2014  . BPPV (benign paroxysmal positional vertigo) 07/01/2011  . Chronic hyperkalemia 05/20/2012   Overview:  Hx of for years  . Concussion syndrome 05/15/2011   This seems like a moderately severe concussion with at least 1 minute loss of consciousness in 15-20 minutes of significant confusion including being unable to state his name   . DEGENERATIVE JOINT DISEASE, HANDS 10/16/2009   Qualifier: Diagnosis of  By: [name], [name]    . Diverticulosis 06/20/2017  . Hemorrhoids, external 06/20/2017  . Lumbar spondylosis 06/20/2017   Fairly significant L4-L5 facet arthropathy with effusion.  Can consider facet injections and potentially RFA down the road.  Marland Kitchen NEUROPATHY 03/01/2008   Qualifier: Diagnosis of  By: [name], [name]    . Paroxysmal atrial fibrillation (HCC)   . Pulmonary insufficiency 03/09/2018   moderate PI noted on echo 03/09/18  . Pulmonary vein stenosis    left inferior pulmonary vein stenosis  . Right inguinal hernia 06/17/2014  . Sinus bradycardia   . Strain of gluteus medius 01/29/2017  . Stroke ([place]) 05/2020  . Vertigo    Patient Active Problem List   Diagnosis Date Noted  . Stroke ([place]) 06/22/2020  . Arthralgia 07/14/2019  . Pulmonary vein stenosis s/p stenting 2020 01/02/2019  . Chronic neck pain 08/23/2018  . Tinnitus 08/23/2018  . Lipoma 08/23/2018  . Paroxysmal atrial fibrillation ([place]) 05/25/2018  . Diverticulosis 06/20/2017  . Hemorrhoids,  external 06/20/2017  . Benign prostatic hyperplasia without urinary obstruction 06/17/2014  . BPPV (benign paroxysmal positional vertigo) 07/01/2011  . DEGENERATIVE JOINT DISEASE, HANDS 10/16/2009  . NEUROPATHY 03/01/2008   Past Surgical History:  Procedure Laterality Date  . ANKLE ARTHROSCOPY    . ATRIAL FIBRILLATION ABLATION N/A 05/25/2018    Procedure: ATRIAL FIBRILLATION ABLATION;  Surgeon: Thompson Grayer, MD;  Location: [place] [facility];  Service: Cardiovascular;  Laterality: N/A;  . KNEE ARTHROSCOPY      Family History  Problem Relation Age of Onset  . Hearing loss Father   . Colon cancer Neg Hx     Medications- Reconciled discharge and current medications in Epic.  Current Outpatient Medications  Medication Sig Dispense Refill  . apixaban (ELIQUIS) 5 MG TABS tablet Take 1 tablet (5 mg total) by mouth 2 (two) times daily. 60 tablet 2  . atorvastatin (LIPITOR) 80 MG tablet Take 1 tablet (80 mg total) by mouth daily. 30 tablet 2  . diazepam (VALIUM) 5 MG tablet Take 1 tablet (5 mg total) by mouth daily as needed for anxiety. 30 tablet 1  . traMADol (ULTRAM) 50 MG tablet TAKE 1 TABLET (50 MG TOTAL) BY MOUTH EVERY 6 (SIX) HOURS AS NEEDED FOR MODERATE PAIN. 30 tablet 5  . Vitamin D, Ergocalciferol, 2000 units CAPS Take 2,000 Units by mouth daily.     . Multiple Vitamins-Minerals (MULTIVITAMIN WITH MINERALS) tablet Take 1 tablet by mouth daily.  (Patient not taking: Reported on 07/04/2020)     No current facility-administered medications for this visit.    Allergies-reviewed and updated Allergies  Allergen Reactions  . Cefdinir Palpitations and Rash  . Codeine Other (See Comments)    Sensitive to this = agitation    Social History   Socioeconomic History  . Marital status: Married    Spouse name: Not on file  . Number of children: Not on file  . Years of education: Not on file  . Highest education level: Not on file  Occupational History  . Not on file  Tobacco Use  . Smoking status: Never Smoker  . Smokeless tobacco: Never Used  Vaping Use  . Vaping Use: Never used  Substance and Sexual Activity  . Alcohol use: No  . Drug use: No  . Sexual activity: Not on file  Other Topics Concern  . Not on file  Social History Narrative  . Not on file   Social Determinants of Health   Financial Resource Strain:     . Difficulty of Paying Living Expenses: Not on file  Food Insecurity:   . Worried About Charity fundraiser in the Last Year: Not on file  . Ran Out of Food in the Last Year: Not on file  Transportation Needs:   . Lack of Transportation (Medical): Not on file  . Lack of Transportation (Non-Medical): Not on file  Physical Activity:   . Days of Exercise per Week: Not on file  . Minutes of Exercise per Session: Not on file  Stress:   . Feeling of Stress : Not on file  Social Connections:   . Frequency of Communication with Friends and Family: Not on file  . Frequency of Social Gatherings with Friends and Family: Not on file  . Attends Religious Services: Not on file  . Active Member of Clubs or Organizations: Not on file  . Attends Archivist Meetings: Not on file  . Marital Status: Not on file  Objective:  Physical Exam: BP 133/82   Pulse (!) 50   Temp 97.9 F (36.6 C) (Temporal)   Ht 5\' 7"  (1.702 m)   Wt 151 lb (68.5 kg)   SpO2 99%   BMI 23.65 kg/m   Gen: NAD, resting comfortably CV: RRR with no murmurs appreciated Pulm: NWOB, CTAB with no crackles, wheezes, or rhonchi GI: Normal bowel sounds present. Soft, Nontender, Nondistended. MSK: No edema, cyanosis, or clubbing noted Skin: Warm, dry Neuro: Grossly normal, moves all extremities Psych: Normal affect and thought content      Kimaya Whitlatch M. [name], MD 07/04/2020 12:17 PM

## 2020-07-04 NOTE — Assessment & Plan Note (Signed)
Regular rate and rhythm today.  Continue Eliquis.

## 2020-07-04 NOTE — Progress Notes (Signed)
PCP: [name], MD Primary Cardiologist: [name] Primary EP: [name]  William Underwood is a 68 y.o. male who presents today for routine electrophysiology followup. I have not seen him since 12/16/18.  He has had no symptoms of afib.  He follows this closely with wearable technology.   Unfortunately, he presented in August with a stroke.  He was in sinus at the time.  This was embolic appearing on MRI.  He has been started back on [facility] therapy (previously stopped at [facility]).  He is making slow improvement.  Prior to his stroke, he was still running 20 miles per week and riding a bike 120+ miles per week.  Due to visual difficulty and unsteadiness, he is not currently able to exercise.  Today, he denies symptoms of palpitations, chest pain, shortness of breath,  lower extremity edema, dizziness, presyncope, or syncope.  The patient is otherwise without complaint today.   Past Medical History:  Diagnosis Date  . Benign prostatic hyperplasia without urinary obstruction 06/17/2014  . BPPV (benign paroxysmal positional vertigo) 07/01/2011  . Chronic hyperkalemia 05/20/2012   Overview:  Hx of for years  . Concussion syndrome 05/15/2011   This seems like a moderately severe concussion with at least 1 minute loss of consciousness in 15-20 minutes of significant confusion including being unable to state his name   . DEGENERATIVE JOINT DISEASE, HANDS 10/16/2009   Qualifier: Diagnosis of  By: [name], [name]    . Diverticulosis 06/20/2017  . Hemorrhoids, external 06/20/2017  . Lumbar spondylosis 06/20/2017   Fairly significant L4-L5 facet arthropathy with effusion.  Can consider facet injections and potentially RFA down the road.  Marland Kitchen NEUROPATHY 03/01/2008   Qualifier: Diagnosis of  By: [name], [name]    . Paroxysmal atrial fibrillation (HCC)   . Pulmonary insufficiency 03/09/2018   moderate PI noted on echo 03/09/18  . Pulmonary vein stenosis    left inferior pulmonary vein stenosis  . Right inguinal  hernia 06/17/2014  . Sinus bradycardia   . Strain of gluteus medius 01/29/2017  . Stroke ([place]) 05/2020  . Vertigo    Past Surgical History:  Procedure Laterality Date  . ANKLE ARTHROSCOPY    . ATRIAL FIBRILLATION ABLATION N/A 05/25/2018   Procedure: ATRIAL FIBRILLATION ABLATION;  Surgeon: Thompson Grayer, MD;  Location: [place] [facility];  Service: Cardiovascular;  Laterality: N/A;  . KNEE ARTHROSCOPY      ROS- all systems are reviewed and negatives except as per HPI above  Current Outpatient Medications  Medication Sig Dispense Refill  . apixaban (ELIQUIS) 5 MG TABS tablet Take 1 tablet (5 mg total) by mouth 2 (two) times daily. 60 tablet 2  . atorvastatin (LIPITOR) 80 MG tablet Take 1 tablet (80 mg total) by mouth daily. 30 tablet 2  . diazepam (VALIUM) 5 MG tablet Take 1 tablet (5 mg total) by mouth daily as needed for anxiety. 30 tablet 1  . Multiple Vitamins-Minerals (MULTIVITAMIN WITH MINERALS) tablet Take 1 tablet by mouth daily.     . traMADol (ULTRAM) 50 MG tablet TAKE 1 TABLET (50 MG TOTAL) BY MOUTH EVERY 6 (SIX) HOURS AS NEEDED FOR MODERATE PAIN. 30 tablet 5  . Vitamin D, Ergocalciferol, 2000 units CAPS Take 2,000 Units by mouth daily.      No current facility-administered medications for this visit.    Physical Exam: Vitals:   07/04/20 1353  BP: 114/72  Pulse: (!) 51  SpO2: 98%  Weight: 151 lb (68.5 kg)  Height: 5\' 7"  (1.702 m)    GEN- The patient is well appearing, alert and oriented x 3 today.   Head- normocephalic, atraumatic Eyes-  Sclera clear, conjunctiva pink Ears- hearing intact Oropharynx- clear Lungs- Clear to ausculation bilaterally, normal work of breathing Heart- Regular rate and rhythm, no murmurs, rubs or gallops, PMI not laterally displaced GI- soft, NT, ND, + BS Extremities- no clubbing, cyanosis, or edema  Wt Readings from Last 3 Encounters:  07/04/20 152 lb (68.9 kg)  07/04/20 151 lb (68.5 kg)  07/04/20 151 lb (68.5 kg)    EKG tracing  ordered today is personally reviewed and shows sinus bradycardia 51 bpm, PR 152 msec, Qtc 377 msec  Assessment and Plan:  1. Recent stroke Follows with Dr Leonie Man.  I agree that it is prudent to resume [place] therapy at this time.  He has also been started on atorvastatin. No further EP workup or management is advised  2. Paroxysmal atrial fibrillation Well controlled post ablation off AAD therapy chads2vasc score is at least 3.  He is on eliquis  3. PV stenosis clinically very stable and doing well   Risks, benefits and potential toxicities for medications prescribed and/or refilled reviewed with patient today.   Return to see me in 6 months  Thompson Grayer MD, [facility] 07/04/2020 4:58 PM

## 2020-07-04 NOTE — Patient Instructions (Signed)
It was very nice to see you today!  I am optimistic that you will continue to have recovery over the next several weeks and several months.  No changes today.  Take care, Dr [name]  Please try these tips to maintain a healthy lifestyle:   Eat at least 3 REAL meals and 1-2 snacks per day.  Aim for no more than 5 hours between eating.  If you eat breakfast, please do so within one hour of getting up.    Each meal should contain half fruits/vegetables, one quarter protein, and one quarter carbs (no bigger than a computer mouse)   Cut down on sweet beverages. This includes juice, soda, and sweet tea.     Drink at least 1 glass of water with each meal and aim for at least 8 glasses per day   Exercise at least 150 minutes every week.

## 2020-07-04 NOTE — Progress Notes (Signed)
[facility] [address] [place]. [place] [address] [phone]       OFFICE CONSULT NOTE  Mr. SHAWNTEZ DICKISON Date of Birth:  [birth year] Medical Record Number:  [id number]   Referring MD: Lynwood Dawley, PA-C  Reason for Referral: Stroke  HPI: Mr. Alcario Drought is a pleasant 69 year old Caucasian male seen today for initial office consultation visit for stroke.  History is obtained from the patient, review of electronic medical records and I personally reviewed imaging films in PACS.  Mr. Marcell has past medical history of paroxysmal atrial fibrillation treated with cardiac ablation with complication of pulmonary vein stenosis treated with stenting in the [facility] clinic.  Also history of concussion, benign paroxysmal positional vertigo and benign prostatic hypertrophy.  He presented on 06/22/2020 with sudden onset of right-sided weakness and numbness which lasted about 20 minutes and resolved by the time he reached the ER.  While waiting there is developed recurrence of symptoms as well as double vision, unsteady gait and slurred speech and a code stroke was called.  He was given IV TPA uneventfully.  CT scan of the head was normal.  CT angiogram was negative for large vessel intracranial extracranial stenosis.  There is mild atherosclerotic disease at left carotid bifurcation without significant stenosis.  MRI scan of the brain showed numerous small cortical infarcts involving left frontal and parietal cortex as well as left thalamus and pons and midbrain in both cerebellar hemispheres the pattern and distribution consistent with central cardiac source of embolism.  2D echo showed normal ejection fraction of 60 to 65% without cardiac source of embolism.  LDL cholesterol was elevated 108 mg percent and hemoglobin A1c at 5.5.  Patient had previously been on Xarelto for a year following his ablation but his cardiologist gave in clinic change him to aspirin since he had no recurrence of  A. fib.  Patient was restarted on Eliquis during this admission for stroke and seems to be tolerating well without bruising or bleeding.  He states his speech has recovered completely and right-sided weakness and strength is also improved.  He still however has persistent double vision and some gait imbalance.  He is currently getting outpatient physical therapy.  He has seen an ophthalmologist who prescribed a prism which she states helped him only for few days and he is no longer finding it to be beneficial.  He has not been referred to outpatient occupational therapy and I recently referred him for speech therapy.  Patient tells me that he is actually had TIAs in early part of some of this year.  He had a transient episode of right upper extremity weakness and numbness which lasted about 20 minutes this time did not seek medical help.  He had a previous TIA in summer 2020 as well when on 04/17/2019 while riding his bike after having written for 36 miles he noticed that he could not steer his bike straight and felt that he was leaning to one side.  He stopped and symptoms resolved after a while. He had an MRI scan of the brain at that time which showed no acute abnormalities.  He was asked to follow-up as outpatient with neurologist but this did not happen.  Is tolerating Lipitor 80 mg well without muscle aches and pains.  He has started walking but not started biking yet. ROS:   14 system review of systems is positive for double vision, imbalance, ataxia, weakness, numbness, gait difficulty all other systems negative  PMH:  Past Medical  History:  Diagnosis Date  . Benign prostatic hyperplasia without urinary obstruction 06/17/2014  . BPPV (benign paroxysmal positional vertigo) 07/01/2011  . Chronic hyperkalemia 05/20/2012   Overview:  Hx of for years  . Concussion syndrome 05/15/2011   This seems like a moderately severe concussion with at least 1 minute loss of consciousness in 15-20 minutes of  significant confusion including being unable to state his name   . DEGENERATIVE JOINT DISEASE, HANDS 10/16/2009   Qualifier: Diagnosis of  By: [name], [name]    . Diverticulosis 06/20/2017  . Hemorrhoids, external 06/20/2017  . Lumbar spondylosis 06/20/2017   Fairly significant L4-L5 facet arthropathy with effusion.  Can consider facet injections and potentially RFA down the road.  Marland Kitchen NEUROPATHY 03/01/2008   Qualifier: Diagnosis of  By: [name], [name]    . Paroxysmal atrial fibrillation (HCC)   . Pulmonary insufficiency 03/09/2018   moderate PI noted on echo 03/09/18  . Pulmonary vein stenosis    left inferior pulmonary vein stenosis  . Right inguinal hernia 06/17/2014  . Sinus bradycardia   . Strain of gluteus medius 01/29/2017  . Stroke ([place]) 05/2020  . Vertigo     Social History:  Social History   Socioeconomic History  . Marital status: Married    Spouse name: Not on file  . Number of children: Not on file  . Years of education: Not on file  . Highest education level: Not on file  Occupational History  . Not on file  Tobacco Use  . Smoking status: Never Smoker  . Smokeless tobacco: Never Used  Vaping Use  . Vaping Use: Never used  Substance and Sexual Activity  . Alcohol use: No  . Drug use: No  . Sexual activity: Not on file  Other Topics Concern  . Not on file  Social History Narrative  . Not on file   Social Determinants of Health   Financial Resource Strain:   . Difficulty of Paying Living Expenses: Not on file  Food Insecurity:   . Worried About Charity fundraiser in the Last Year: Not on file  . Ran Out of Food in the Last Year: Not on file  Transportation Needs:   . Lack of Transportation (Medical): Not on file  . Lack of Transportation (Non-Medical): Not on file  Physical Activity:   . Days of Exercise per Week: Not on file  . Minutes of Exercise per Session: Not on file  Stress:   . Feeling of Stress : Not on file  Social Connections:   . Frequency  of Communication with Friends and Family: Not on file  . Frequency of Social Gatherings with Friends and Family: Not on file  . Attends Religious Services: Not on file  . Active Member of Clubs or Organizations: Not on file  . Attends Archivist Meetings: Not on file  . Marital Status: Not on file  Intimate Partner Violence:   . Fear of Current or Ex-Partner: Not on file  . Emotionally Abused: Not on file  . Physically Abused: Not on file  . Sexually Abused: Not on file    Medications:   Current Outpatient Medications on File Prior to Visit  Medication Sig Dispense Refill  . apixaban (ELIQUIS) 5 MG TABS tablet Take 1 tablet (5 mg total) by mouth 2 (two) times daily. 60 tablet 2  . atorvastatin (LIPITOR) 80 MG tablet Take 1 tablet (80 mg total) by mouth daily. 30 tablet 2  . diazepam (VALIUM)  5 MG tablet Take 1 tablet (5 mg total) by mouth daily as needed for anxiety. 30 tablet 1  . Multiple Vitamins-Minerals (MULTIVITAMIN WITH MINERALS) tablet Take 1 tablet by mouth daily.     . traMADol (ULTRAM) 50 MG tablet TAKE 1 TABLET (50 MG TOTAL) BY MOUTH EVERY 6 (SIX) HOURS AS NEEDED FOR MODERATE PAIN. 30 tablet 5  . Vitamin D, Ergocalciferol, 2000 units CAPS Take 2,000 Units by mouth daily.      No current facility-administered medications on file prior to visit.    Allergies:   Allergies  Allergen Reactions  . Cefdinir Palpitations and Rash  . Codeine Other (See Comments)    Sensitive to this = agitation    Physical Exam General: well developed, well nourished middle-aged Caucasian male, seated, in no evident distress Head: head normocephalic and atraumatic.   Neck: supple with no carotid or supraclavicular bruits Cardiovascular: regular rate and rhythm, no murmurs Musculoskeletal: no deformity Skin:  no rash/petichiae Vascular:  Normal pulses all extremities  Neurologic Exam Mental Status: Awake and fully alert. Oriented to place and time. Recent and remote memory  intact. Attention span, concentration and fund of knowledge appropriate. Mood and affect appropriate.  Cranial Nerves: Fundoscopic exam reveals sharp disc margins. Pupils equal, briskly reactive to light. 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  Visual fields full to confrontation. Hearing intact. Facial sensation intact. Face, tongue, palate moves normally and symmetrically.  Motor: Normal bulk and tone. Normal strength in all tested extremity muscles. Sensory.: intact to touch , pinprick , position and vibratory sensation.  Coordination: Rapid alternating movements normal in all extremities. Finger-to-nose and heel-to-shin performed accurately on the right but there is mild left finger-to-nose dysmetria. Gait and Station: Arises from chair without difficulty. Stance is normal. Gait demonstrates normal stride length and balance . Able to heel, toe and tandem walk with mild difficulty.  Reflexes: 1+ and symmetric. Toes downgoing.   NIHSS  2 Modified [place]  2  ASSESSMENT: 69 year old Caucasian male with multiple embolic strokes in August 2021 likely related to history of atrial fibrillation even though cardioverted few years ago.  Vascular risk factors of hyperlipidemia, history of A. fib and age     PLAN: I had a long discussion with the patient with regards to his recent by cerebral multiple embolic strokes from which she seems to be recovering well except for residual diplopia.  I agree with long-term anticoagulation with Eliquis given his history of paroxysmal A. fib even though he has been treated with ablation and been free from A. fib.  I recommend referral to Occupational Therapy to learn compensatory mechanisms for his diplopia.  Continue ongoing physical therapy as outpatient.  I advised him not to drive till his diplopia is corrected.  I advised him to increase his activity gradually as tolerated.  Maintain aggressive risk factor modification with strict control of  hypertension with blood pressure goal below 130/90, lipids with LDL cholesterol goal below 70 mg percent and diabetes with hemoglobin A1c goal below 6.5%.  Plan to repeat lipid profile at follow-up visit and may consider reducing dose of Lipitor if LDL is way below goal.  Greater than 50% time during this 45-minute consultation visit was spent on counseling and coordination of care about his multiple embolic strokes, history of atrial fibrillation and discussion about stroke prevention and treatment and answering questions.  Return for follow-up in 3 months or call earlier if necessary Antony Contras, MD Note: This document was prepared with digital dictation  and possible smart Company secretary. Any transcriptional errors that result from this process are unintentional.

## 2020-07-04 NOTE — Patient Instructions (Addendum)
Medication Instructions:  Your physician recommends that you continue on your current medications as directed. Please refer to the Current Medication list given to you today.  *If you need a refill on your cardiac medications before your next appointment, please call your pharmacy*  Lab Work: None ordered.  If you have labs (blood work) drawn today and your tests are completely normal, you will receive your results only by: . MyChart Message (if you have MyChart) OR . A paper copy in the mail If you have any lab test that is abnormal or we need to change your treatment, we will call you to review the results.  Testing/Procedures: None ordered.  Follow-Up: At [facility], you and your health needs are our priority.  As part of our continuing mission to provide you with exceptional heart care, we have created designated Provider Care Teams.  These Care Teams include your primary Cardiologist (physician) and Advanced Practice Providers (APPs -  Physician Assistants and Nurse Practitioners) who all work together to provide you with the care you need, when you need it.  We recommend signing up for the patient portal called "MyChart".  Sign up information is provided on this After Visit Summary.  MyChart is used to connect with patients for Virtual Visits (Telemedicine).  Patients are able to view lab/test results, encounter notes, upcoming appointments, etc.  Non-urgent messages can be sent to your provider as well.   To learn more about what you can do with MyChart, go to [email or web address].    Your next appointment:   Your physician wants you to follow-up in: 6 months with Dr. Allred. You will receive a reminder letter in the mail two months in advance. If you don't receive a letter, please call our office to schedule the follow-up appointment.    Other Instructions:  

## 2020-07-05 ENCOUNTER — Ambulatory Visit: Payer: PPO | Admitting: Physical Therapy

## 2020-07-05 DIAGNOSIS — R2689 Other abnormalities of gait and mobility: Secondary | ICD-10-CM

## 2020-07-05 DIAGNOSIS — R2681 Unsteadiness on feet: Secondary | ICD-10-CM | POA: Diagnosis not present

## 2020-07-05 DIAGNOSIS — R262 Difficulty in walking, not elsewhere classified: Secondary | ICD-10-CM

## 2020-07-05 DIAGNOSIS — M6281 Muscle weakness (generalized): Secondary | ICD-10-CM

## 2020-07-05 NOTE — Therapy (Signed)
[facility] [address] [place], [place], [address] Phone: [phone]   Fax:  [phone]  Physical Therapy Treatment  Patient Details  Name: William Underwood MRN: [id number] Date of Birth: Dec 12, 1950 Referring Provider (PT): Garvin Fila, MD   Encounter Date: 07/05/2020   PT End of Session - 07/05/20 1204    Visit Number 2    Number of Visits 17    Date for PT Re-Evaluation 09/25/20   written for 60 day POC   Authorization Type HTA 2021 - $15 CO PAY    PT Start Time 0931    PT Stop Time 1013    PT Time Calculation (min) 42 min    Equipment Utilized During Treatment Gait belt    Activity Tolerance Patient tolerated treatment well    Behavior During Therapy [facility] for tasks assessed/performed           Past Medical History:  Diagnosis Date  . Benign prostatic hyperplasia without urinary obstruction 06/17/2014  . BPPV (benign paroxysmal positional vertigo) 07/01/2011  . Chronic hyperkalemia 05/20/2012   Overview:  Hx of for years  . Concussion syndrome 05/15/2011   This seems like a moderately severe concussion with at least 1 minute loss of consciousness in 15-20 minutes of significant confusion including being unable to state his name   . DEGENERATIVE JOINT DISEASE, HANDS 10/16/2009   Qualifier: Diagnosis of  By: [name], [name]    . Diverticulosis 06/20/2017  . Hemorrhoids, external 06/20/2017  . Lumbar spondylosis 06/20/2017   Fairly significant L4-L5 facet arthropathy with effusion.  Can consider facet injections and potentially RFA down the road.  Marland Kitchen NEUROPATHY 03/01/2008   Qualifier: Diagnosis of  By: [name], [name]    . Paroxysmal atrial fibrillation (HCC)   . Pulmonary insufficiency 03/09/2018   moderate PI noted on echo 03/09/18  . Pulmonary vein stenosis    left inferior pulmonary vein stenosis  . Right inguinal hernia 06/17/2014  . Sinus bradycardia   . Strain of gluteus medius 01/29/2017  . Stroke ([place])  05/2020  . Vertigo     Past Surgical History:  Procedure Laterality Date  . ANKLE ARTHROSCOPY    . ATRIAL FIBRILLATION ABLATION N/A 05/25/2018   Procedure: ATRIAL FIBRILLATION ABLATION;  Surgeon: Thompson Grayer, MD;  Location: [place] [facility];  Service: Cardiovascular;  Laterality: N/A;  . KNEE ARTHROSCOPY      There were no vitals filed for this visit.   Subjective Assessment - 07/05/20 0937    Subjective Has been walking a mile and a half with no cane. Still wavering. No falls. Saw Dr. Leonie Man yesterday - being referred to OT for diplopia. Still having diplopia - unsure if it has gotten better or he is just getting more used to it .    Patient is accompained by: --   [name]   Pertinent History BPPV, paroxysmal atrial fibrillation s/p ablation and pulmonary vein stenosis, hx of concussion (2012)    Diagnostic tests Numerous areas of acute infarct. Multiple small infarcts are presentin the posterior circulation including the pons and midbrain in themidline as well as small acute infarcts in the left frontal andparietal cortex. Small acute infarct left thalamus. Findingscompatible with multiple emboli. No hemorrhage.    Patient Stated Goals wants to be able to get back to road cycling.    Currently in Pain? No/denies              [facility] PT Assessment - 07/05/20  0940      High Level Balance   High Level Balance Activites Other (comment)    High Level Balance Comments mCTSIB (performed with feet together), condition 1: 30 seconds, condition 2: 30 seconds mild postural sway, condition 3: 30 seconds, condition 4: 30 seconds moderate postural sway      Functional Gait  Assessment   Gait assessed  Yes    Gait Level Surface Walks 20 ft in less than 7 sec but greater than 5.5 sec, uses assistive device, slower speed, mild gait deviations, or deviates 6-10 in outside of the 12 in walkway width.   6.18   Change in Gait Speed Able to smoothly change walking speed without loss of balance or  gait deviation. Deviate no more than 6 in outside of the 12 in walkway width.    Gait with Horizontal Head Turns Performs head turns smoothly with slight change in gait velocity (eg, minor disruption to smooth gait path), deviates 6-10 in outside 12 in walkway width, or uses an assistive device.    Gait with Vertical Head Turns Performs task with slight change in gait velocity (eg, minor disruption to smooth gait path), deviates 6 - 10 in outside 12 in walkway width or uses assistive device    Gait and Pivot Turn Pivot turns safely in greater than 3 sec and stops with no loss of balance, or pivot turns safely within 3 sec and stops with mild imbalance, requires small steps to catch balance.    Step Over Obstacle Is able to step over one shoe box (4.5 in total height) without changing gait speed. No evidence of imbalance.    Gait with Narrow Base of Support Ambulates less than 4 steps heel to toe or cannot perform without assistance.    Gait with Eyes Closed Walks 20 ft, slow speed, abnormal gait pattern, evidence for imbalance, deviates 10-15 in outside 12 in walkway width. Requires more than 9 sec to ambulate 20 ft.   8.31 seconds    Ambulating Backwards Walks 20 ft, slow speed, abnormal gait pattern, evidence for imbalance, deviates 10-15 in outside 12 in walkway width.    Steps Alternating feet, no rail.    Total Score 18    FGA comment: 18/30                  Access Code: [email or web address] URL: [email or web address] Date: 07/05/2020 Prepared by: Janann August  Initiated HEP, see medbridge for more details :   Exercises Romberg Stance Eyes Closed on Foam Pad - 1 x daily - 7 x weekly - 3 sets - 30 reps Standing Balance with Eyes Closed on Foam - 1 x daily - 7 x weekly - 2 sets - 10 reps - with feet hip width distance, 2 x 10 reps head turns, 2 x 10 reps head nods  Tandem Stance - 1 x daily - 7 x weekly - 3 sets - 10-15 hold Standing Single Leg Stance with Counter Support - 1  x daily - 7 x weekly - 3 sets - 10 hold  Also tandem gait at countertop with fingertip support.         [facility] Adult PT Treatment/Exercise - 07/05/20 0001      Ambulation/Gait   Ambulation/Gait Yes    Ambulation/Gait Assistance 5: Supervision    Ambulation/Gait Assistance Details pt carried SPC into session, walked around session with no AD with no LOB  Balance Exercises - 07/05/20 1005      Balance Exercises: Standing   SLS Limitations    SLS Limitations noted L trendelenberg with L SLS             PT Education - 07/05/20 1013    Education Details results of FGA, initial HEP    Person(s) Educated Patient            PT Short Term Goals - 07/05/20 1206      PT SHORT TERM GOAL #1   Title Pt will be independent with initial HEP in order to build upon functional gains made in therapy. ALL STGS DUE 07/26/20    Time 4    Period Weeks    Status New    Target Date 07/26/20      PT SHORT TERM GOAL #2   Title Pt will improve FGA score to at least a 21/30 in order to demo decr fall risk.    Baseline FGA 18/30 on 07/05/20    Time 4    Period Weeks    Status Revised      PT SHORT TERM GOAL #3   Title Pt will decr TUG time to 13.5 seconds or less with no AD in order to demo decr fall risk.    Baseline 14.97 no AD    Time 4    Period Weeks    Status New             PT Long Term Goals - 07/05/20 1207      PT LONG TERM GOAL #1   Title Pt will be independent with FINAL HEP in order to build upon functional gains made in therapy. ALL LTGS DUE 08/23/20    Time 8    Period Weeks    Status New      PT LONG TERM GOAL #2   Title Pt will improve FGA score to at least 24/30 in order to demo decr fall risk.    Baseline 18/30    Time 8    Period Weeks    Status Revised      PT LONG TERM GOAL #3   Title Pt will improve gait speed with no AD vs. LRAD to at least 3.0 ft/sec in order to demo improved gait efficiency.    Time 8    Period Weeks    Status  New      PT LONG TERM GOAL #4   Title Pt will be able to walk 1,000' outdoors over unlevel surfaces with supervision with no LOB in order to demo improved community mobility with no AD vs. LRAD.    Time 8    Period Weeks    Status New                 Plan - 07/05/20 1208    Clinical Impression Statement Performed the FGA today with pt scoring an 18/30, indicating that pt is at a high risk for falls - updated STG and LTG as appropriate. Remainder of session focused on initiating HEP for vestibular input for balance, tandem stance/gait, and SLS. Pt demonstrating improved ambulation throughout session today compared to eval, able to walk with no AD with supervision. Will continue to progress towards LTGs.    Personal Factors and Comorbidities Comorbidity 3+;Fitness;Past/Current Experience;Behavior Pattern   pt extremely active prior to stroke   Comorbidities BPPV, paroxysmal atrial fibrillation s/p ablation and pulmonary vein stenosis, hx of concussion (2012)    Examination-Activity Limitations Stand;Squat;Transfers;Locomotion  Level;Stairs    Examination-Participation Restrictions Community Activity;Driving   running, cycling   Stability/Clinical Decision Making Evolving/Moderate complexity    Rehab Potential Good    PT Frequency 2x / week    PT Duration 8 weeks    PT Treatment/Interventions ADLs/Self Care Home Management;DME Instruction;Gait training;Stair training;Therapeutic activities;Functional mobility training;Therapeutic exercise;Neuromuscular re-education;Balance training;Patient/family education;Vestibular;Visual/perceptual remediation/compensation    PT Next Visit Plan how is initial HEP? balance (SLS, tandem stance, vestibular input for balance), dynamic gait training, hip ABD strengthening    Consulted and Agree with Plan of Care Patient   pt's gf          Patient will benefit from skilled therapeutic intervention in order to improve the following deficits and  impairments:  Abnormal gait, Decreased balance, Decreased coordination, Decreased endurance, Decreased safety awareness, Decreased knowledge of use of DME, Decreased strength, Dizziness, Difficulty walking, Impaired vision/preception, Impaired sensation  Visit Diagnosis: Unsteadiness on feet  Other abnormalities of gait and mobility  Muscle weakness (generalized)  Difficulty in walking, not elsewhere classified     Problem List Patient Active Problem List   Diagnosis Date Noted  . Stroke ([place]) 06/22/2020  . Arthralgia 07/14/2019  . Pulmonary vein stenosis s/p stenting 2020 01/02/2019  . Chronic neck pain 08/23/2018  . Tinnitus 08/23/2018  . Lipoma 08/23/2018  . Paroxysmal atrial fibrillation ([place]) 05/25/2018  . Diverticulosis 06/20/2017  . Hemorrhoids, external 06/20/2017  . Benign prostatic hyperplasia without urinary obstruction 06/17/2014  . BPPV (benign paroxysmal positional vertigo) 07/01/2011  . DEGENERATIVE JOINT DISEASE, HANDS 10/16/2009  . NEUROPATHY 03/01/2008    Arliss Journey, PT, DPT 07/05/2020, 12:24 PM  [facility] [address] [place], [place], [address] Phone: [phone]   Fax:  [phone]  Name: William Underwood MRN: [id number] Date of Birth: 19-Feb-1951

## 2020-07-05 NOTE — Patient Instructions (Signed)
Access Code: [email or web address] URL: [email or web address] Date: 07/05/2020 Prepared by: Janann August  Exercises Romberg Stance Eyes Closed on Foam Pad - 1 x daily - 7 x weekly - 3 sets - 30 reps Standing Balance with Eyes Closed on Foam - 1 x daily - 7 x weekly - 2 sets - 10 reps Tandem Stance - 1 x daily - 7 x weekly - 3 sets - 10-15 hold Standing Single Leg Stance with Counter Support - 1 x daily - 7 x weekly - 3 sets - 10 hold

## 2020-07-09 ENCOUNTER — Other Ambulatory Visit: Payer: Self-pay

## 2020-07-09 ENCOUNTER — Inpatient Hospital Stay: Payer: PPO | Admitting: Family Medicine

## 2020-07-09 ENCOUNTER — Ambulatory Visit: Payer: PPO

## 2020-07-09 DIAGNOSIS — R2681 Unsteadiness on feet: Secondary | ICD-10-CM | POA: Diagnosis not present

## 2020-07-09 DIAGNOSIS — R2689 Other abnormalities of gait and mobility: Secondary | ICD-10-CM

## 2020-07-09 DIAGNOSIS — R42 Dizziness and giddiness: Secondary | ICD-10-CM

## 2020-07-09 DIAGNOSIS — M6281 Muscle weakness (generalized): Secondary | ICD-10-CM

## 2020-07-09 DIAGNOSIS — R262 Difficulty in walking, not elsewhere classified: Secondary | ICD-10-CM

## 2020-07-09 NOTE — Therapy (Signed)
[facility] [address] [place], [place], [address] Phone: [phone]   Fax:  [phone]  Physical Therapy Treatment  Patient Details  Name: William Underwood MRN: [id number] Date of Birth: 1950-12-13 Referring Provider (PT): Garvin Fila, MD   Encounter Date: 07/09/2020   PT End of Session - 07/09/20 1446    Visit Number 3    Number of Visits 17    Date for PT Re-Evaluation 09/25/20   written for 60 day POC   Authorization Type HTA 2021 - $15 CO PAY    PT Start Time 6967    PT Stop Time 1529    PT Time Calculation (min) 44 min    Equipment Utilized During Treatment Gait belt    Activity Tolerance Patient tolerated treatment well    Behavior During Therapy WFL for tasks assessed/performed           Past Medical History:  Diagnosis Date  . Benign prostatic hyperplasia without urinary obstruction 06/17/2014  . BPPV (benign paroxysmal positional vertigo) 07/01/2011  . Chronic hyperkalemia 05/20/2012   Overview:  Hx of for years  . Concussion syndrome 05/15/2011   This seems like a moderately severe concussion with at least 1 minute loss of consciousness in 15-20 minutes of significant confusion including being unable to state his name   . DEGENERATIVE JOINT DISEASE, HANDS 10/16/2009   Qualifier: Diagnosis of  By: [name], [name]    . Diverticulosis 06/20/2017  . Hemorrhoids, external 06/20/2017  . Lumbar spondylosis 06/20/2017   Fairly significant L4-L5 facet arthropathy with effusion.  Can consider facet injections and potentially RFA down the road.  Marland Kitchen NEUROPATHY 03/01/2008   Qualifier: Diagnosis of  By: [name], [name]    . Paroxysmal atrial fibrillation (HCC)   . Pulmonary insufficiency 03/09/2018   moderate PI noted on echo 03/09/18  . Pulmonary vein stenosis    left inferior pulmonary vein stenosis  . Right inguinal hernia 06/17/2014  . Sinus bradycardia   . Strain of gluteus medius 01/29/2017  . Stroke ([place])  05/2020  . Vertigo     Past Surgical History:  Procedure Laterality Date  . ANKLE ARTHROSCOPY    . ATRIAL FIBRILLATION ABLATION N/A 05/25/2018   Procedure: ATRIAL FIBRILLATION ABLATION;  Surgeon: Thompson Grayer, MD;  Location: [facility];  Service: Cardiovascular;  Laterality: N/A;  . KNEE ARTHROSCOPY      There were no vitals filed for this visit.   Subjective Assessment - 07/09/20 1448    Subjective Patient reports no new changes/complaints since last visit. Patient reports still has some diplopia. No falls.    Patient is accompained by: --   [name]   Pertinent History BPPV, paroxysmal atrial fibrillation s/p ablation and pulmonary vein stenosis, hx of concussion (2012)    Diagnostic tests Numerous areas of acute infarct. Multiple small infarcts are presentin the posterior circulation including the pons and midbrain in themidline as well as small acute infarcts in the left frontal andparietal cortex. Small acute infarct left thalamus. Findingscompatible with multiple emboli. No hemorrhage.    Patient Stated Goals wants to be able to get back to road cycling.    Currently in Pain? No/denies                             [facility] Adult PT Treatment/Exercise - 07/09/20 0001      Ambulation/Gait   Ambulation/Gait Yes  Ambulation/Gait Assistance 5: Supervision    Ambulation/Gait Assistance Details throughout therapy gym with activities     Assistive device None    Gait Pattern Step-through pattern    Ambulation Surface Level;Indoor      High Level Balance   High Level Balance Activities Head turns    High Level Balance Comments Completed horizontal/vertical head turns, 4 x 30' each. Increased sway noted with horizontal head turns > vertical head turns. Patient completing head turn every 2-3 steps. PT providing verbal cues for slowed paced with head turns due to vision deficits. CGA throughout.                Balance Exercises - 07/09/20 0001       Balance Exercises: Standing   Standing Eyes Opened Narrow base of support (BOS);Head turns;Foam/compliant surface;Limitations    Standing Eyes Opened Limitations completed romberg with EO, 3 x 30 seconds.     Standing Eyes Closed Narrow base of support (BOS);Head turns;Foam/compliant surface;3 reps;Limitations    Standing Eyes Closed Limitations completed EC 3 x 30 seconds, completed horizontal/vertical head turns 1 x 10 reps with EC. Increased difficulty with vertical head turns with vision removed. CGA    Tandem Stance Eyes open;Intermittent upper extremity support;3 reps;30 secs;Limitations    Tandem Stance Time 25-30 secs    Balance Beam completed static standing across balance beam, 2 x 1 minute with eyes open working on maintaining balance. progressed to completing with eyes closed 3 x 25-30 seconds. one instance of LOB with vision removed requiring CGA and UE support from PT.     Sidestepping Foam/compliant support;3 reps;Limitations    Sidestepping Limitations completed side stepping along blue balance beam x 3 laps, down and back without UE support. PT providing verbal cues for pace and step length. CGA required    Other Standing Exercises completed SLS activity with opposite LE placed on soccer ball, completed ant/post roll x 10 reps on RLE/LLE. Then progressed to completing lateral movement x 10 reps on BLE. Increased difficulty noted with lateral movement, and one instance of LOB requiring CGA from PT.                PT Short Term Goals - 07/05/20 1206      PT SHORT TERM GOAL #1   Title Pt will be independent with initial HEP in order to build upon functional gains made in therapy. ALL STGS DUE 07/26/20    Time 4    Period Weeks    Status New    Target Date 07/26/20      PT SHORT TERM GOAL #2   Title Pt will improve FGA score to at least a 21/30 in order to demo decr fall risk.    Baseline FGA 18/30 on 07/05/20    Time 4    Period Weeks    Status Revised      PT SHORT TERM  GOAL #3   Title Pt will decr TUG time to 13.5 seconds or less with no AD in order to demo decr fall risk.    Baseline 14.97 no AD    Time 4    Period Weeks    Status New             PT Long Term Goals - 07/05/20 1207      PT LONG TERM GOAL #1   Title Pt will be independent with FINAL HEP in order to build upon functional gains made in therapy. ALL LTGS DUE 08/23/20  Time 8    Period Weeks    Status New      PT LONG TERM GOAL #2   Title Pt will improve FGA score to at least 24/30 in order to demo decr fall risk.    Baseline 18/30    Time 8    Period Weeks    Status Revised      PT LONG TERM GOAL #3   Title Pt will improve gait speed with no AD vs. LRAD to at least 3.0 ft/sec in order to demo improved gait efficiency.    Time 8    Period Weeks    Status New      PT LONG TERM GOAL #4   Title Pt will be able to walk 1,000' outdoors over unlevel surfaces with supervision with no LOB in order to demo improved community mobility with no AD vs. LRAD.    Time 8    Period Weeks    Status New                 Plan - 07/09/20 1744    Clinical Impression Statement Today's skilled session included continued progression of balance activities focused on activities promoting SLS, and vestibular input for balance. Patient continues to require CGA throughout session, and demo increased difficulty with vision removed and complaint surfaces today. Will continue to progress toward goals.    Personal Factors and Comorbidities Comorbidity 3+;Fitness;Past/Current Experience;Behavior Pattern   pt extremely active prior to stroke   Comorbidities BPPV, paroxysmal atrial fibrillation s/p ablation and pulmonary vein stenosis, hx of concussion (2012)    Examination-Activity Limitations Stand;Squat;Transfers;Locomotion Level;Stairs    Examination-Participation Restrictions Community Activity;Driving   running, cycling   Stability/Clinical Decision Making Evolving/Moderate complexity    Rehab  Potential Good    PT Frequency 2x / week    PT Duration 8 weeks    PT Treatment/Interventions ADLs/Self Care Home Management;DME Instruction;Gait training;Stair training;Therapeutic activities;Functional mobility training;Therapeutic exercise;Neuromuscular re-education;Balance training;Patient/family education;Vestibular;Visual/perceptual remediation/compensation    PT Next Visit Plan continue high level balance (SLS, tandem stance, vestibular input for balance), dynamic gait training, hip ABD strengthening    Consulted and Agree with Plan of Care Patient   pt's gf          Patient will benefit from skilled therapeutic intervention in order to improve the following deficits and impairments:  Abnormal gait, Decreased balance, Decreased coordination, Decreased endurance, Decreased safety awareness, Decreased knowledge of use of DME, Decreased strength, Dizziness, Difficulty walking, Impaired vision/preception, Impaired sensation  Visit Diagnosis: Unsteadiness on feet  Other abnormalities of gait and mobility  Muscle weakness (generalized)  Difficulty in walking, not elsewhere classified  Dizziness and giddiness     Problem List Patient Active Problem List   Diagnosis Date Noted  . Stroke ([place]) 06/22/2020  . Arthralgia 07/14/2019  . Pulmonary vein stenosis s/p stenting 2020 01/02/2019  . Chronic neck pain 08/23/2018  . Tinnitus 08/23/2018  . Lipoma 08/23/2018  . Paroxysmal atrial fibrillation ([place]) 05/25/2018  . Diverticulosis 06/20/2017  . Hemorrhoids, external 06/20/2017  . Benign prostatic hyperplasia without urinary obstruction 06/17/2014  . BPPV (benign paroxysmal positional vertigo) 07/01/2011  . DEGENERATIVE JOINT DISEASE, HANDS 10/16/2009  . NEUROPATHY 03/01/2008    Jones Bales, PT, DPT 07/09/2020, 5:47 PM  [facility] [address] [place], [place], [address] Phone: [phone]   Fax:   [phone]  Name: KAIRYN OLMEDA MRN: [id number] Date of Birth: 04-04-1951

## 2020-07-10 ENCOUNTER — Ambulatory Visit: Payer: PPO | Admitting: Internal Medicine

## 2020-07-12 ENCOUNTER — Ambulatory Visit: Payer: PPO | Admitting: General Practice

## 2020-07-13 ENCOUNTER — Ambulatory Visit: Payer: PPO | Admitting: Physical Therapy

## 2020-07-13 ENCOUNTER — Encounter: Payer: Self-pay | Admitting: Physical Therapy

## 2020-07-13 ENCOUNTER — Other Ambulatory Visit: Payer: Self-pay

## 2020-07-13 VITALS — BP 125/76 | HR 54

## 2020-07-13 DIAGNOSIS — R262 Difficulty in walking, not elsewhere classified: Secondary | ICD-10-CM

## 2020-07-13 DIAGNOSIS — R2681 Unsteadiness on feet: Secondary | ICD-10-CM

## 2020-07-13 DIAGNOSIS — R2689 Other abnormalities of gait and mobility: Secondary | ICD-10-CM

## 2020-07-13 DIAGNOSIS — M6281 Muscle weakness (generalized): Secondary | ICD-10-CM

## 2020-07-13 NOTE — Therapy (Addendum)
[facility] [address] [place], [place], [address] Phone: [phone]   Fax:  [phone]  Physical Therapy Treatment  Patient Details  Name: William Underwood MRN: [id number] Date of Birth: 1950/12/02 Referring Provider (PT): Garvin Fila, MD   Encounter Date: 07/13/2020   07/13/20 1528  PT Visits / Re-Eval  Visit Number 4  Number of Visits 17  Date for PT Re-Evaluation 09/25/20 (written for 60 day POC)  Authorization  Authorization Type HTA 2021 - $15 CO PAY  PT Time Calculation  PT Start Time 1447  PT Stop Time 1527  PT Time Calculation (min) 40 min  PT - End of Session  Equipment Utilized During Treatment Gait belt  Activity Tolerance Patient tolerated treatment well  Behavior During Therapy [facility] for tasks assessed/performed     Past Medical History:  Diagnosis Date  . Benign prostatic hyperplasia without urinary obstruction 06/17/2014  . BPPV (benign paroxysmal positional vertigo) 07/01/2011  . Chronic hyperkalemia 05/20/2012   Overview:  Hx of for years  . Concussion syndrome 05/15/2011   This seems like a moderately severe concussion with at least 1 minute loss of consciousness in 15-20 minutes of significant confusion including being unable to state his name   . DEGENERATIVE JOINT DISEASE, HANDS 10/16/2009   Qualifier: Diagnosis of  By: [name], [name]    . Diverticulosis 06/20/2017  . Hemorrhoids, external 06/20/2017  . Lumbar spondylosis 06/20/2017   Fairly significant L4-L5 facet arthropathy with effusion.  Can consider facet injections and potentially RFA down the road.  Marland Kitchen NEUROPATHY 03/01/2008   Qualifier: Diagnosis of  By: [name], [name]    . Paroxysmal atrial fibrillation (HCC)   . Pulmonary insufficiency 03/09/2018   moderate PI noted on echo 03/09/18  . Pulmonary vein stenosis    left inferior pulmonary vein stenosis  . Right inguinal hernia 06/17/2014  . Sinus bradycardia   . Strain of  gluteus medius 01/29/2017  . Stroke ([place]) 05/2020  . Vertigo     Past Surgical History:  Procedure Laterality Date  . ANKLE ARTHROSCOPY    . ATRIAL FIBRILLATION ABLATION N/A 05/25/2018   Procedure: ATRIAL FIBRILLATION ABLATION;  Surgeon: Thompson Grayer, MD;  Location: [place] CV LAB;  Service: Cardiovascular;  Laterality: N/A;  . KNEE ARTHROSCOPY      Vitals:   07/13/20 1453  BP: 125/76  Pulse: (!) 54        07/13/20 1449  Symptoms/Limitations  Subjective Feeling a little more off balance and "tingly" today. Notices when he turns his head, his eyes take a while to catch up and there is a lag time. No dizziness. Felt much better on monday.  Patient is accompained by:  ([name])  Pertinent History BPPV, paroxysmal atrial fibrillation s/p ablation and pulmonary vein stenosis, hx of concussion (2012)  Diagnostic tests Numerous areas of acute infarct. Multiple small infarcts are presentin the posterior circulation including the pons and midbrain in themidline as well as small acute infarcts in the left frontal andparietal cortex. Small acute infarct left thalamus. Findingscompatible with multiple emboli. No hemorrhage.  Patient Stated Goals wants to be able to get back to road cycling.  Pain Assessment  Currently in Pain? No/denies                    [facility] Adult PT Treatment/Exercise - 07/13/20 0001      Ambulation/Gait   Ambulation/Gait Yes    Ambulation/Gait Assistance 5: Supervision;4: Min guard  Ambulation/Gait Assistance Details outdoors over grass surfaces with dynamic gait tasks: head turns, head nods, retro gait - needing min guard when performing head motions with gait    Ambulation Distance (Feet) 600 Feet    Assistive device None    Gait Pattern Step-through pattern    Ambulation Surface Indoor;Level    Gait Comments 60' slow and controlled marching outdoors over grass surfaces with holding leg in air for 3 seconds, min guard/min A for balance                07/13/20 1510  Balance Exercises: Standing  Tandem Stance Eyes open;Foam/compliant surface;Limitations  Tandem Stance Time on blue foam beam, 2 x 20 seconds B (first reps needing intermittent fingertip support for balance)  SLS Limitations  SLS Limitations standing on blue foam beam alternating forward cone taps x10 reps needing intermittent UE support, then downgraded to blue foam pad for more surface area and repeated same activity x10 reps and then performing x10 reps forward cone tap and cross body cone tap - needing intermittent support when more fatigued, cues for slowed and controlled and for shifting weight  Rockerboard Anterior/posterior;EO;Limitations  Rockerboard Limitations x10 reps weight shifting - cues for hip/ankle strategy, keeping board still x10 reps head turns , x10 reps head nods - needing 2 episodes of min A  Tandem Gait Forward;Intermittent upper extremity support;Foam/compliant surface;3 reps;Limitations  Tandem Gait Limitations on blue beam, cues for slowed and controlled           PT Short Term Goals - 07/05/20 1206      PT SHORT TERM GOAL #1   Title Pt will be independent with initial HEP in order to build upon functional gains made in therapy. ALL STGS DUE 07/26/20    Time 4    Period Weeks    Status New    Target Date 07/26/20      PT SHORT TERM GOAL #2   Title Pt will improve FGA score to at least a 21/30 in order to demo decr fall risk.    Baseline FGA 18/30 on 07/05/20    Time 4    Period Weeks    Status Revised      PT SHORT TERM GOAL #3   Title Pt will decr TUG time to 13.5 seconds or less with no AD in order to demo decr fall risk.    Baseline 14.97 no AD    Time 4    Period Weeks    Status New             PT Long Term Goals - 07/05/20 1207      PT LONG TERM GOAL #1   Title Pt will be independent with FINAL HEP in order to build upon functional gains made in therapy. ALL LTGS DUE 08/23/20    Time 8    Period Weeks     Status New      PT LONG TERM GOAL #2   Title Pt will improve FGA score to at least 24/30 in order to demo decr fall risk.    Baseline 18/30    Time 8    Period Weeks    Status Revised      PT LONG TERM GOAL #3   Title Pt will improve gait speed with no AD vs. LRAD to at least 3.0 ft/sec in order to demo improved gait efficiency.    Time 8    Period Weeks    Status New  PT LONG TERM GOAL #4   Title Pt will be able to walk 1,000' outdoors over unlevel surfaces with supervision with no LOB in order to demo improved community mobility with no AD vs. LRAD.    Time 8    Period Weeks    Status New                 Plan - 07/15/20 1441    Clinical Impression Statement Today's session continued to focus on balance strategies on compliant surfaces - with focus on narrow BOS, SLS and vestibular input for balance. Assessed gait outdoors with pt performing with supervision with no AD, but needed min guard when performing head motions, especially R/L. Rest breaks taken as needed due to fatigue. Will continue to progress towards LTGs.    Personal Factors and Comorbidities Comorbidity 3+;Fitness;Past/Current Experience;Behavior Pattern   pt extremely active prior to stroke   Comorbidities BPPV, paroxysmal atrial fibrillation s/p ablation and pulmonary vein stenosis, hx of concussion (2012)    Examination-Activity Limitations Stand;Squat;Transfers;Locomotion Level;Stairs    Examination-Participation Restrictions Community Activity;Driving   running, cycling   Stability/Clinical Decision Making Evolving/Moderate complexity    Rehab Potential Good    PT Frequency 2x / week    PT Duration 8 weeks    PT Treatment/Interventions ADLs/Self Care Home Management;DME Instruction;Gait training;Stair training;Therapeutic activities;Functional mobility training;Therapeutic exercise;Neuromuscular re-education;Balance training;Patient/family education;Vestibular;Visual/perceptual remediation/compensation     PT Next Visit Plan would convergence exercises be appropriate? continue high level balance (SLS, tandem stance, vestibular input for balance), dynamic gait training, hip ABD strengthening    Consulted and Agree with Plan of Care Patient   pt's gf          Patient will benefit from skilled therapeutic intervention in order to improve the following deficits and impairments:  Abnormal gait, Decreased balance, Decreased coordination, Decreased endurance, Decreased safety awareness, Decreased knowledge of use of DME, Decreased strength, Dizziness, Difficulty walking, Impaired vision/preception, Impaired sensation  Visit Diagnosis: Other abnormalities of gait and mobility  Unsteadiness on feet  Muscle weakness (generalized)  Difficulty in walking, not elsewhere classified     Problem List Patient Active Problem List   Diagnosis Date Noted  . Stroke ([place]) 06/22/2020  . Arthralgia 07/14/2019  . Pulmonary vein stenosis s/p stenting 2020 01/02/2019  . Chronic neck pain 08/23/2018  . Tinnitus 08/23/2018  . Lipoma 08/23/2018  . Paroxysmal atrial fibrillation ([place]) 05/25/2018  . Diverticulosis 06/20/2017  . Hemorrhoids, external 06/20/2017  . Benign prostatic hyperplasia without urinary obstruction 06/17/2014  . BPPV (benign paroxysmal positional vertigo) 07/01/2011  . DEGENERATIVE JOINT DISEASE, HANDS 10/16/2009  . NEUROPATHY 03/01/2008    Arliss Journey, PT, DPT  07/15/2020, 2:43 PM  [facility] [address] [place], [place], [address] Phone: [phone]   Fax:  [phone]  Name: William Underwood MRN: [id number] Date of Birth: [birth year]

## 2020-07-16 ENCOUNTER — Other Ambulatory Visit: Payer: Self-pay

## 2020-07-16 ENCOUNTER — Ambulatory Visit: Payer: PPO

## 2020-07-16 ENCOUNTER — Ambulatory Visit: Payer: PPO | Admitting: Occupational Therapy

## 2020-07-16 DIAGNOSIS — R2681 Unsteadiness on feet: Secondary | ICD-10-CM

## 2020-07-16 DIAGNOSIS — R2689 Other abnormalities of gait and mobility: Secondary | ICD-10-CM

## 2020-07-16 DIAGNOSIS — R41842 Visuospatial deficit: Secondary | ICD-10-CM

## 2020-07-16 DIAGNOSIS — R278 Other lack of coordination: Secondary | ICD-10-CM

## 2020-07-16 DIAGNOSIS — R42 Dizziness and giddiness: Secondary | ICD-10-CM

## 2020-07-16 DIAGNOSIS — M6281 Muscle weakness (generalized): Secondary | ICD-10-CM

## 2020-07-16 DIAGNOSIS — R262 Difficulty in walking, not elsewhere classified: Secondary | ICD-10-CM

## 2020-07-16 NOTE — Therapy (Signed)
[facility] [address] [place], [place], [address] Phone: [phone]   Fax:  [phone]  Physical Therapy Treatment  Patient Details  Name: William Underwood MRN: [id number] Date of Birth: [birth year] Referring Provider (PT): Garvin Fila, MD   Encounter Date: 07/16/2020   PT End of Session - 07/16/20 1408    Visit Number 5    Number of Visits 17    Date for PT Re-Evaluation 09/25/20   written for 60 day POC   Authorization Type HTA 2021 - $15 CO PAY    PT Start Time 0962    PT Stop Time 1444    PT Time Calculation (min) 41 min    Equipment Utilized During Treatment Gait belt    Activity Tolerance Patient tolerated treatment well    Behavior During Therapy [facility] for tasks assessed/performed           Past Medical History:  Diagnosis Date   Benign prostatic hyperplasia without urinary obstruction 06/17/2014   BPPV (benign paroxysmal positional vertigo) 07/01/2011   Chronic hyperkalemia 05/20/2012   Overview:  Hx of for years   Concussion syndrome 05/15/2011   This seems like a moderately severe concussion with at least 1 minute loss of consciousness in 15-20 minutes of significant confusion including being unable to state his name    DEGENERATIVE JOINT DISEASE, HANDS 10/16/2009   Qualifier: Diagnosis of  By: [name], [name]     Diverticulosis 06/20/2017   Hemorrhoids, external 06/20/2017   Lumbar spondylosis 06/20/2017   Fairly significant L4-L5 facet arthropathy with effusion.  Can consider facet injections and potentially RFA down the road.   NEUROPATHY 03/01/2008   Qualifier: Diagnosis of  By: [name], [name]     Paroxysmal atrial fibrillation ([place])    Pulmonary insufficiency 03/09/2018   moderate PI noted on echo 03/09/18   Pulmonary vein stenosis    left inferior pulmonary vein stenosis   Right inguinal hernia 06/17/2014   Sinus bradycardia    Strain of gluteus medius 01/29/2017   Stroke ([place])  05/2020   Vertigo     Past Surgical History:  Procedure Laterality Date   ANKLE ARTHROSCOPY     ATRIAL FIBRILLATION ABLATION N/A 05/25/2018   Procedure: ATRIAL FIBRILLATION ABLATION;  Surgeon: Thompson Grayer, MD;  Location: [place] CV LAB;  Service: Cardiovascular;  Laterality: N/A;   KNEE ARTHROSCOPY      There were no vitals filed for this visit.   Subjective Assessment - 07/16/20 1406    Subjective Patient just finished up with OT. No new changes/complaint since last visit. No falls.    Patient is accompained by: --   [name]   Pertinent History BPPV, paroxysmal atrial fibrillation s/p ablation and pulmonary vein stenosis, hx of concussion (2012)    Diagnostic tests Numerous areas of acute infarct. Multiple small infarcts are presentin the posterior circulation including the pons and midbrain in themidline as well as small acute infarcts in the left frontal andparietal cortex. Small acute infarct left thalamus. Findingscompatible with multiple emboli. No hemorrhage.    Patient Stated Goals wants to be able to get back to road cycling.    Currently in Pain? No/denies                             [facility] Adult PT Treatment/Exercise - 07/16/20 0001      Ambulation/Gait   Ambulation/Gait Yes    Ambulation/Gait Assistance  5: Supervision    Ambulation/Gait Assistance Details throughout therapy gym with activities    Assistive device None    Gait Pattern Step-through pattern    Ambulation Surface Level;Indoor      High Level Balance   High Level Balance Activities Other (comment)    High Level Balance Comments Completed ambulation with self ball toss 3 x 115'. Increased balance challenge noted initially, reqiure CGA. No dizziness reported with completion.       Neuro Re-ed    Neuro Re-ed Details  Completed saccade activity with numbered cards, completed x 2 reps. Increased difficulty noted initially requiring verbal cues from PT. PRogressed to completing  convergence activity with saccades with one card arm length away and PT holding other card, completed x 2 reps. Patient reports increased difficulty with convergence, and coming back to card at arms length. With saccade, patient demo increased nystagmus on L eye with gaze to L.                Balance Exercises - 07/16/20 0001      Balance Exercises: Standing   Stepping Strategy Anterior;Posterior;Foam/compliant surface;Limitations    Stepping Strategy Limitations completed anterior stepping strategy alteranting BLE x 10 reps, increased difficulty with stepping back onto foam beam. Progressed to completing posterior stepping strategy x 10 reps. increased difficulty with anterior > posterior, and all completed without UE support and CGA from PT.     Balance Beam completed static standing across balance beam, 2 x 1 minute with eyes open working on maintaining balance. progressed to completing with eyes closed 3 x 30 seconds. CGA required. Initially patient demo diffficulty and increaed balance challenge but demo improvements with improved reps.     Other Standing Exercises Completed marching without UE support with eyes open x 2 minutes, then progressed to completing with eyes closed x 45 secs - 1 minutes. Increased balance challenge with vision removed and draft to L side noted. Standing on large rockerboard, completed diagonal weight shifts x 15 reps with feet in staggered stance positoined, completed with UE support initially progressing to no UE support. Increased difficulty with weight shift posterior, require intermittent UE support to maintain balance and CGA from PT. Altered staggered stance and completed additional x 15 reps.                PT Short Term Goals - 07/05/20 1206      PT SHORT TERM GOAL #1   Title Pt will be independent with initial HEP in order to build upon functional gains made in therapy. ALL STGS DUE 07/26/20    Time 4    Period Weeks    Status New    Target Date  07/26/20      PT SHORT TERM GOAL #2   Title Pt will improve FGA score to at least a 21/30 in order to demo decr fall risk.    Baseline FGA 18/30 on 07/05/20    Time 4    Period Weeks    Status Revised      PT SHORT TERM GOAL #3   Title Pt will decr TUG time to 13.5 seconds or less with no AD in order to demo decr fall risk.    Baseline 14.97 no AD    Time 4    Period Weeks    Status New             PT Long Term Goals - 07/05/20 1207      PT LONG TERM  GOAL #1   Title Pt will be independent with FINAL HEP in order to build upon functional gains made in therapy. ALL LTGS DUE 08/23/20    Time 8    Period Weeks    Status New      PT LONG TERM GOAL #2   Title Pt will improve FGA score to at least 24/30 in order to demo decr fall risk.    Baseline 18/30    Time 8    Period Weeks    Status Revised      PT LONG TERM GOAL #3   Title Pt will improve gait speed with no AD vs. LRAD to at least 3.0 ft/sec in order to demo improved gait efficiency.    Time 8    Period Weeks    Status New      PT LONG TERM GOAL #4   Title Pt will be able to walk 1,000' outdoors over unlevel surfaces with supervision with no LOB in order to demo improved community mobility with no AD vs. LRAD.    Time 8    Period Weeks    Status New                 Plan - 07/16/20 1449    Clinical Impression Statement Today's skilled PT session included continued balance training exercises on complaint surfaces, working to promote improved hip/ankle strategies. Also completed convergence/saccade exercises to further address visual changes. Will continue to progress toward goals.    Personal Factors and Comorbidities Comorbidity 3+;Fitness;Past/Current Experience;Behavior Pattern   pt extremely active prior to stroke   Comorbidities BPPV, paroxysmal atrial fibrillation s/p ablation and pulmonary vein stenosis, hx of concussion (2012)    Examination-Activity Limitations Stand;Squat;Transfers;Locomotion  Level;Stairs    Examination-Participation Restrictions Community Activity;Driving   running, cycling   Stability/Clinical Decision Making Evolving/Moderate complexity    Rehab Potential Good    PT Frequency 2x / week    PT Duration 8 weeks    PT Treatment/Interventions ADLs/Self Care Home Management;DME Instruction;Gait training;Stair training;Therapeutic activities;Functional mobility training;Therapeutic exercise;Neuromuscular re-education;Balance training;Patient/family education;Vestibular;Visual/perceptual remediation/compensation    PT Next Visit Plan continue vestibular exercies.  continue high level balance (SLS, tandem stance, vestibular input for balance), dynamic gait training, hip ABD strengthening    Consulted and Agree with Plan of Care Patient   pt's gf          Patient will benefit from skilled therapeutic intervention in order to improve the following deficits and impairments:  Abnormal gait, Decreased balance, Decreased coordination, Decreased endurance, Decreased safety awareness, Decreased knowledge of use of DME, Decreased strength, Dizziness, Difficulty walking, Impaired vision/preception, Impaired sensation  Visit Diagnosis: Other abnormalities of gait and mobility  Unsteadiness on feet  Muscle weakness (generalized)  Difficulty in walking, not elsewhere classified  Dizziness and giddiness     Problem List Patient Active Problem List   Diagnosis Date Noted   Stroke ([place]) 06/22/2020   Arthralgia 07/14/2019   Pulmonary vein stenosis s/p stenting 2020 01/02/2019   Chronic neck pain 08/23/2018   Tinnitus 08/23/2018   Lipoma 08/23/2018   Paroxysmal atrial fibrillation ([place]) 05/25/2018   Diverticulosis 06/20/2017   Hemorrhoids, external 06/20/2017   Benign prostatic hyperplasia without urinary obstruction 06/17/2014   BPPV (benign paroxysmal positional vertigo) 07/01/2011   DEGENERATIVE JOINT DISEASE, HANDS 10/16/2009   NEUROPATHY 03/01/2008      Jones Bales, PT, DPT 07/16/2020, 3:24 PM  [facility] [address] [place], [place], [address]  Phone: [phone]   Fax:  [phone]  Name: JAISEAN MONTEFORTE MRN: [id number] Date of Birth: [birth year]

## 2020-07-16 NOTE — Patient Instructions (Signed)
1Saccade (horizontal and vertical) in sitting - looking at 2 targets. DON'T move your head. Do 10 reps each direction. Do 2-3 times per day.  2. seated with head/eye turns - eyes open, turn right then left. to look at target. Do10 reps 2x per day  3. Hold a card in front of you at 12-16 inches in front, bring card closer to you within the range that it is not blurry, then extend arm to move card away from you, 5-10 reps , 2x day

## 2020-07-16 NOTE — Therapy (Signed)
[facility] [address] [place], [place], [address] Phone: [phone]   Fax:  [phone]  Occupational Therapy Evaluation  Patient Details  Name: SHAWAN CORELLA MRN: [id number] Date of Birth: 12/31/1950 Referring Provider (OT): Dr. Leonie Man   Encounter Date: 07/16/2020   OT End of Session - 07/16/20 1507    Visit Number 1    Number of Visits 17    Date for OT Re-Evaluation 09/11/20    Authorization Type [employer]    Authorization Time Period VL: [place] no auth    Authorization - Visit Number 1    Authorization - Number of Visits 10    Progress Note Due on Visit 10    OT Start Time 1415    OT Stop Time 1500    OT Time Calculation (min) 45 min    Activity Tolerance Patient tolerated treatment well    Behavior During Therapy [facility] for tasks assessed/performed           Past Medical History:  Diagnosis Date  . Benign prostatic hyperplasia without urinary obstruction 06/17/2014  . BPPV (benign paroxysmal positional vertigo) 07/01/2011  . Chronic hyperkalemia 05/20/2012   Overview:  Hx of for years  . Concussion syndrome 05/15/2011   This seems like a moderately severe concussion with at least 1 minute loss of consciousness in 15-20 minutes of significant confusion including being unable to state his name   . DEGENERATIVE JOINT DISEASE, HANDS 10/16/2009   Qualifier: Diagnosis of  By: [name], [name]    . Diverticulosis 06/20/2017  . Hemorrhoids, external 06/20/2017  . Lumbar spondylosis 06/20/2017   Fairly significant L4-L5 facet arthropathy with effusion.  Can consider facet injections and potentially RFA down the road.  Marland Kitchen NEUROPATHY 03/01/2008   Qualifier: Diagnosis of  By: [name], [name]    . Paroxysmal atrial fibrillation (HCC)   . Pulmonary insufficiency 03/09/2018   moderate PI noted on echo 03/09/18  . Pulmonary vein stenosis    left inferior pulmonary vein stenosis  . Right inguinal hernia 06/17/2014    . Sinus bradycardia   . Strain of gluteus medius 01/29/2017  . Stroke ([place]) 05/2020  . Vertigo     Past Surgical History:  Procedure Laterality Date  . ANKLE ARTHROSCOPY    . ATRIAL FIBRILLATION ABLATION N/A 05/25/2018   Procedure: ATRIAL FIBRILLATION ABLATION;  Surgeon: Thompson Grayer, MD;  Location: [facility];  Service: Cardiovascular;  Laterality: N/A;  . KNEE ARTHROSCOPY      There were no vitals filed for this visit.   Subjective Assessment - 07/16/20 1318    Subjective  Pt reports he sees physical therapy here with Chloe and Brooke.    Pertinent History PMH: BPPV, paroxysmal atrial fibrillation s/p ablation and pulmonary vein stenosis, hx of concussion (2012).    Patient Stated Goals Getting back to riding the bike and running and driving. Double Vision.    Currently in Pain? No/denies             [facility] OT Assessment - 07/16/20 1328      Assessment   Medical Diagnosis CVA, Diploplia    Referring Provider (OT) Dr. Leonie Man    Onset Date/Surgical Date 06/22/20    Hand Dominance Right    Prior Therapy BPPV therapy at this location years ago; receiving PT currently OP Neuro      Precautions   Precautions Fall    Precaution Comments double vision, L>R deficits  Balance Screen   Has the patient fallen in the past 6 months No   seeing PT     [place] expects to be discharged to: Private residence    Living Arrangements Alone    Available Help at Discharge Family    Type of [place] to live on main level with bedroom/bathroom    Bathroom Primary school teacher - [employer]    Lives With Alone   significant other      Prior Function   Level of Independence Independent    Vocation Retired    Marine scientist    Leisure biking, running. previously VERY ACTIVE      ADL   Eating/Feeding Independent    Grooming  Supervision/safety   due to diplopia   Upper Body Bathing Modified independent    Lower Body Bathing Modified independent    Upper Body Dressing Independent    Lower Body Dressing Independent    Toilet Transfer Independent    Tub/Shower Transfer Modified independent      IADL   Shopping Assistance for transportation;Needs to be accompanied on any shopping trip    Light Housekeeping Maintains house alone or with occasional assistance    Meal Prep Plans, prepares and serves adequate meals independently    [place] on family or friends for transportation    Medication Management Is responsible for taking medication in correct dosages at correct time    Financial Management Manages day-to-day purchases, but needs help with banking, major purchases, etc.   diffiuclty with reading with diplopia     Written Expression   Dominant Hand Right      Vision - History   Baseline Vision Wears glasses all the time    Visual History Other (comment)    Patient Visual Report --   currently wears trifocals but does not have most recent Rx.    Additional Comments Lasik 2001      Vision Assessment   Ocular Range of Motion Within Functional Limits    Alignment/Gaze Preference Within Defined Limits    Tracking/Visual Pursuits Able to track stimulus in all quads without difficulty    Saccades Additional eye shifts occurred during testing   Left eye jumpy   Convergence Impaired (comment)   blurry reported at approx 10" from nose   Visual Fields No apparent deficits    Diplopia Assessment Other (comment)    Depth Perception --   pt reports difficulty with eye occlusion   Patient has diffculty with activities due to visual impairment Measuring ingredients;Writing checks;Balancing checkbook;Reading bills    Comment diplopia absent with occlusion. split vertical and horizontal at times. double vision worse to left. left eye with jumping with saccades. pt curently without most recent  prescription for trifocals. Has the past 2 pairs.      Cognition   Overall Cognitive Status No family/caregiver present to determine baseline cognitive functioning    Cognition Comments hyper verbal -not sure what baseline is      Sensation   Light Touch Appears Intact    Hot/Cold Appears Intact      Coordination   Coordination to assess further in next session and in functional context      ROM / Strength   AROM / PROM / Strength AROM      AROM   Overall AROM  Within functional limits for tasks performed      Strength   Overall Strength Comments WFL for tasks performed                             OT Short Term Goals - 07/16/20 1519      OT SHORT TERM GOAL #1   Title Pt will be independent with diplopia HEP 08/13/20    Time 4    Period Weeks    Status New    Target Date 08/13/20      OT SHORT TERM GOAL #2   Title Pt will read large print page of text with partial occlusion PRN with no reports of diplopia    Time 4    Period Weeks    Status New      OT SHORT TERM GOAL #3   Title Pt will complete table top scanning with 75 % accuracy with partial occlusion PRN with no reports of diplopia    Time 4    Period Weeks    Status New      OT SHORT TERM GOAL #4   Title Pt will perform environmental scanning with 75% accuracy in a minimlly distracting environment with minimal reports of diplopia with partial occlusion PRN    Time 4    Period Weeks    Status New      OT SHORT TERM GOAL #5   Title Pt will demonstrate reading of a bill (electric, cable, etc) with minimal errors and no reports of diplopia with partial occlusion PRN to increase independence with financial management.    Time 4    Period Weeks    Status New      OT SHORT TERM GOAL #6   Title Pt will prepare a simple warm prep meal with modified independence while locating all items and with good safety in order to increase independence with home management.    Time 4    Period Weeks     Status New             OT Long Term Goals - 07/16/20 1532      OT LONG TERM GOAL #1   Title Pt will be independent with updated diplopia HEP 08/27/2020    Time 6    Period Weeks    Status New    Target Date 08/27/20      OT LONG TERM GOAL #2   Title Pt will read 12pt or smaller print page of text with partial occlusion PRN with no reports of diplopia    Time 6    Period Weeks    Status New      OT LONG TERM GOAL #3   Title Pt will complete table top scanning with 90 % accuracy with partial occlusion PRN with no reports of diplopia    Time 6    Period Weeks    Status New      OT LONG TERM GOAL #4   Title Pt will perform environmental scanning with 90% accuracy in a minimlly distracting environment with minimal reports of diplopia with partial occlusion PRN    Time 6    Period Weeks    Status New      OT LONG TERM GOAL #5   Title Pt will demonstrate reading of a bill (electric, cable, etc) with no errors and no reports of diplopia with partial occlusion PRN to increase independence with financial management.  Time 6    Period Weeks    Status New      Long Term Additional Goals   Additional Long Term Goals Yes      OT LONG TERM GOAL #6   Title Patient will verbalize understanding of compensatory strategies for visual deficits.    Time 6    Period Weeks    Status New                 Plan - 07/16/20 1456    Clinical Impression Statement Patient is a 69 year old male referred to Neuro outpatient occupational therapy s/p CVA on 06/22/20. MRI found numerous areas of acute infarct. Multiple small infarcts are present in the posterior circulation including the pons and midbrain in the midline as well as small acute infarcts in the left frontal and parietal cortex. Small acute infarct left thalamus. Findings compatible with multiple emboli. No hemorrhage. Pt's primary compliant is double vision, recently saw ophthalmologist and was given prism glasses.  Pt's PMH is  significant for: BPPV, paroxysmal atrial fibrillation s/p ablation and pulmonary vein stenosis, hx of concussion (2012). Patient presents with deficits in the areas of double vision (diplopia) and oculomotor function impeding skills and indepedence with ADLs and IADLs. Skilled occupational therapy is recommended to target these areas in order to maximize indepedence.    OT Occupational Profile and History Detailed Assessment- Review of Records and additional review of physical, cognitive, psychosocial history related to current functional performance    Occupational performance deficits (Please refer to evaluation for details): Leisure;IADL's;ADL's    Body Structure / Function / Physical Skills ADL;Coordination;UE functional use;Vestibular;Vision;Strength;IADL;Decreased knowledge of use of DME;Decreased knowledge of precautions;ROM;FMC;Dexterity;Balance;Endurance;GMC    Cognitive Skills Attention;Safety Awareness    Rehab Potential Good    Clinical Decision Making Limited treatment options, no task modification necessary    Comorbidities Affecting Occupational Performance: May have comorbidities impacting occupational performance    Modification or Assistance to Complete Evaluation  No modification of tasks or assist necessary to complete eval    OT Frequency 2x / week    OT Duration 8 weeks    OT Treatment/Interventions Self-care/ADL training;Visual/perceptual remediation/compensation;Therapeutic activities;Balance training;Manual Therapy;DME and/or AE instruction    Plan diplopia HEP. assess coordination and balance more in functionl context.           Patient will benefit from skilled therapeutic intervention in order to improve the following deficits and impairments:   Body Structure / Function / Physical Skills: ADL, Coordination, UE functional use, Vestibular, Vision, Strength, IADL, Decreased knowledge of use of DME, Decreased knowledge of precautions, ROM, FMC, Dexterity, Balance,  Endurance, GMC Cognitive Skills: Attention, Safety Awareness     Visit Diagnosis: Visuospatial deficit  Other lack of coordination  Muscle weakness (generalized)    Problem List Patient Active Problem List   Diagnosis Date Noted  . Stroke ([place]) 06/22/2020  . Arthralgia 07/14/2019  . Pulmonary vein stenosis s/p stenting 2020 01/02/2019  . Chronic neck pain 08/23/2018  . Tinnitus 08/23/2018  . Lipoma 08/23/2018  . Paroxysmal atrial fibrillation ([place]) 05/25/2018  . Diverticulosis 06/20/2017  . Hemorrhoids, external 06/20/2017  . Benign prostatic hyperplasia without urinary obstruction 06/17/2014  . BPPV (benign paroxysmal positional vertigo) 07/01/2011  . DEGENERATIVE JOINT DISEASE, HANDS 10/16/2009  . NEUROPATHY 03/01/2008    Zachery Conch MOT, OTR/L  07/17/2020, 12:03 PM  [facility] [address] [place], [place], [address] Phone: [phone]   Fax:  [phone]  Name:  IZREAL KOCK MRN: [id number] Date of Birth: 1951-07-24

## 2020-07-18 ENCOUNTER — Other Ambulatory Visit: Payer: Self-pay

## 2020-07-18 ENCOUNTER — Ambulatory Visit: Payer: PPO

## 2020-07-18 ENCOUNTER — Ambulatory Visit: Payer: PPO | Admitting: Internal Medicine

## 2020-07-18 DIAGNOSIS — R2681 Unsteadiness on feet: Secondary | ICD-10-CM | POA: Diagnosis not present

## 2020-07-18 DIAGNOSIS — R42 Dizziness and giddiness: Secondary | ICD-10-CM

## 2020-07-18 DIAGNOSIS — M6281 Muscle weakness (generalized): Secondary | ICD-10-CM

## 2020-07-18 DIAGNOSIS — R262 Difficulty in walking, not elsewhere classified: Secondary | ICD-10-CM

## 2020-07-18 DIAGNOSIS — R2689 Other abnormalities of gait and mobility: Secondary | ICD-10-CM

## 2020-07-18 NOTE — Therapy (Signed)
[facility] [address] [place], [place], [address] Phone: [phone]   Fax:  [phone]  Physical Therapy Treatment  Patient Details  Name: William Underwood MRN: [id number] Date of Birth: 1951-01-15 Referring Provider (PT): Garvin Fila, MD   Encounter Date: 07/18/2020   PT End of Session - 07/18/20 1450    Visit Number 6    Number of Visits 17    Date for PT Re-Evaluation 09/25/20   written for 60 day POC   Authorization Type HTA 2021 - $15 [employer] PAY    PT Start Time 1446    PT Stop Time 1529    PT Time Calculation (min) 43 min    Equipment Utilized During Treatment Gait belt    Activity Tolerance Patient tolerated treatment well    Behavior During Therapy WFL for tasks assessed/performed           Past Medical History:  Diagnosis Date  . Benign prostatic hyperplasia without urinary obstruction 06/17/2014  . BPPV (benign paroxysmal positional vertigo) 07/01/2011  . Chronic hyperkalemia 05/20/2012   Overview:  Hx of for years  . Concussion syndrome 05/15/2011   This seems like a moderately severe concussion with at least 1 minute loss of consciousness in 15-20 minutes of significant confusion including being unable to state his name   . DEGENERATIVE JOINT DISEASE, HANDS 10/16/2009   Qualifier: Diagnosis of  By: [name], [name]    . Diverticulosis 06/20/2017  . Hemorrhoids, external 06/20/2017  . Lumbar spondylosis 06/20/2017   Fairly significant L4-L5 facet arthropathy with effusion.  Can consider facet injections and potentially RFA down the road.  Marland Kitchen NEUROPATHY 03/01/2008   Qualifier: Diagnosis of  By: [name], [name]    . Paroxysmal atrial fibrillation (HCC)   . Pulmonary insufficiency 03/09/2018   moderate PI noted on echo 03/09/18  . Pulmonary vein stenosis    left inferior pulmonary vein stenosis  . Right inguinal hernia 06/17/2014  . Sinus bradycardia   . Strain of gluteus medius 01/29/2017  . Stroke ([place])  05/2020  . Vertigo     Past Surgical History:  Procedure Laterality Date  . ANKLE ARTHROSCOPY    . ATRIAL FIBRILLATION ABLATION N/A 05/25/2018   Procedure: ATRIAL FIBRILLATION ABLATION;  Surgeon: Thompson Grayer, MD;  Location: [facility];  Service: Cardiovascular;  Laterality: N/A;  . KNEE ARTHROSCOPY      There were no vitals filed for this visit.   Subjective Assessment - 07/18/20 1448    Subjective Patient reports no new changes/complaints since last visit. No falls. Just has been riding bike daily 20-25 miles, is getting fatigued in the legs mainly.    Patient is accompained by: --   [name]   Pertinent History BPPV, paroxysmal atrial fibrillation s/p ablation and pulmonary vein stenosis, hx of concussion (2012)    Diagnostic tests Numerous areas of acute infarct. Multiple small infarcts are presentin the posterior circulation including the pons and midbrain in themidline as well as small acute infarcts in the left frontal andparietal cortex. Small acute infarct left thalamus. Findingscompatible with multiple emboli. No hemorrhage.    Patient Stated Goals wants to be able to get back to road cycling.    Currently in Pain? No/denies                             [facility] Adult PT Treatment/Exercise - 07/18/20 0001  Ambulation/Gait   Ambulation/Gait Yes    Ambulation/Gait Assistance 5: Supervision    Ambulation/Gait Assistance Details throughout therapy session with activites    Assistive device None    Gait Pattern Step-through pattern    Ambulation Surface Level;Indoor      Neuro Re-ed    Neuro Re-ed Details  Standing with feet shoulder width apart completed: VOR x 1 for 45 secs x 2 reps. No dizziness reported, patient reporting double vision with L gaze. Progressed to completing saccades in horizontal/vertical plane 2 x 1 minute each. Patient report increased difficulty noted with horizontal > vertical, patient demo nystagmus with left gaze. Progressed  to completing saccades with additoin of stroop task, having patient verbalize color of word vs. reading the word. increased difficulty noted with addition of stroop task. Educated patient on completion of saccades and added to HEP. Handout provided (see patient instructions for details).                Balance Exercises - 07/18/20 0001      Balance Exercises: Standing   Tandem Stance Eyes open;Foam/compliant surface;Intermittent upper extremity support;3 reps;Time    Tandem Stance Time standing on blue mat, completed alternating foot position tandem stance for full 30 seconds. completed with intermittent UE support and CGA    SLS Eyes open;Foam/compliant surface;Intermittent upper extremity support;2 reps;Time    SLS Time 15-25 secs    SLS Limitations CGA required and intermittent UE support, completed alternating SLS    Tandem Gait Forward;Intermittent upper extremity support;Foam/compliant surface;3 reps;Limitations    Tandem Gait Limitations completed x 3 laps, down and back with light UE support at [employer] Foam/compliant surface;Intermittent upper extremity assist;Forwards;Limitations    Marching Limitations completed x 3 laps with light UE support down and back at countertop, progressed to completing without UE support x 2 laps, down and back. CGA required. increased balance challenge with SLS noted.              PT Education - 07/18/20 1551    Education Details HEP Update    Person(s) Educated Patient    Methods Explanation;Demonstration;Handout    Comprehension Verbalized understanding;Returned demonstration            PT Short Term Goals - 07/05/20 1206      PT SHORT TERM GOAL #1   Title Pt will be independent with initial HEP in order to build upon functional gains made in therapy. ALL STGS DUE 07/26/20    Time 4    Period Weeks    Status New    Target Date 07/26/20      PT SHORT TERM GOAL #2   Title Pt will improve FGA score to at least a 21/30  in order to demo decr fall risk.    Baseline FGA 18/30 on 07/05/20    Time 4    Period Weeks    Status Revised      PT SHORT TERM GOAL #3   Title Pt will decr TUG time to 13.5 seconds or less with no AD in order to demo decr fall risk.    Baseline 14.97 no AD    Time 4    Period Weeks    Status New             PT Long Term Goals - 07/05/20 1207      PT LONG TERM GOAL #1   Title Pt will be independent with FINAL HEP in order to build upon functional gains  made in therapy. ALL LTGS DUE 08/23/20    Time 8    Period Weeks    Status New      PT LONG TERM GOAL #2   Title Pt will improve FGA score to at least 24/30 in order to demo decr fall risk.    Baseline 18/30    Time 8    Period Weeks    Status Revised      PT LONG TERM GOAL #3   Title Pt will improve gait speed with no AD vs. LRAD to at least 3.0 ft/sec in order to demo improved gait efficiency.    Time 8    Period Weeks    Status New      PT LONG TERM GOAL #4   Title Pt will be able to walk 1,000' outdoors over unlevel surfaces with supervision with no LOB in order to demo improved community mobility with no AD vs. LRAD.    Time 8    Period Weeks    Status New                 Plan - 07/18/20 1552    Clinical Impression Statement Today's skilled session included continued exercises working on improved VOR and saccades to address visual impairments, with patient tolerating well. Continued balance exercises as tolerated progressing on complaint surfaces. Will continue to progress toward all LTGs.    Personal Factors and Comorbidities Comorbidity 3+;Fitness;Past/Current Experience;Behavior Pattern   pt extremely active prior to stroke   Comorbidities BPPV, paroxysmal atrial fibrillation s/p ablation and pulmonary vein stenosis, hx of concussion (2012)    Examination-Activity Limitations Stand;Squat;Transfers;Locomotion Level;Stairs    Examination-Participation Restrictions Community Activity;Driving   running,  cycling   Stability/Clinical Decision Making Evolving/Moderate complexity    Rehab Potential Good    PT Frequency 2x / week    PT Duration 8 weeks    PT Treatment/Interventions ADLs/Self Care Home Management;DME Instruction;Gait training;Stair training;Therapeutic activities;Functional mobility training;Therapeutic exercise;Neuromuscular re-education;Balance training;Patient/family education;Vestibular;Visual/perceptual remediation/compensation    PT Next Visit Plan How was saccade exercise? continue vestibular exercies.  continue high level balance (SLS, tandem stance, vestibular input for balance), dynamic gait training, hip ABD strengthening    Consulted and Agree with Plan of Care Patient   pt's gf          Patient will benefit from skilled therapeutic intervention in order to improve the following deficits and impairments:  Abnormal gait, Decreased balance, Decreased coordination, Decreased endurance, Decreased safety awareness, Decreased knowledge of use of DME, Decreased strength, Dizziness, Difficulty walking, Impaired vision/preception, Impaired sensation  Visit Diagnosis: Muscle weakness (generalized)  Unsteadiness on feet  Other abnormalities of gait and mobility  Difficulty in walking, not elsewhere classified  Dizziness and giddiness     Problem List Patient Active Problem List   Diagnosis Date Noted  . Stroke ([place]) 06/22/2020  . Arthralgia 07/14/2019  . Pulmonary vein stenosis s/p stenting 2020 01/02/2019  . Chronic neck pain 08/23/2018  . Tinnitus 08/23/2018  . Lipoma 08/23/2018  . Paroxysmal atrial fibrillation ([place]) 05/25/2018  . Diverticulosis 06/20/2017  . Hemorrhoids, external 06/20/2017  . Benign prostatic hyperplasia without urinary obstruction 06/17/2014  . BPPV (benign paroxysmal positional vertigo) 07/01/2011  . DEGENERATIVE JOINT DISEASE, HANDS 10/16/2009  . NEUROPATHY 03/01/2008    Jones Bales, PT, DPT 07/18/2020, 3:53 PM  [facility] [address] [place], [place], [address] Phone: [phone]   Fax:  [phone]  Name: William Underwood MRN:  [id number] Date of Birth: October 25, 1951

## 2020-07-18 NOTE — Patient Instructions (Signed)
Oculomotor: Saccades    Holding two targets positioned side by side 8 inches apart, move eyes quickly from target to target as head stays still. Move 45 seconds each direction. Perform standing. Repeat 3 times per session. Do 2 sessions per day.  Copyright  VHI. All rights reserved.

## 2020-07-23 ENCOUNTER — Other Ambulatory Visit: Payer: Self-pay

## 2020-07-23 ENCOUNTER — Ambulatory Visit: Payer: PPO

## 2020-07-23 DIAGNOSIS — R262 Difficulty in walking, not elsewhere classified: Secondary | ICD-10-CM

## 2020-07-23 DIAGNOSIS — M6281 Muscle weakness (generalized): Secondary | ICD-10-CM

## 2020-07-23 DIAGNOSIS — R42 Dizziness and giddiness: Secondary | ICD-10-CM

## 2020-07-23 DIAGNOSIS — R2681 Unsteadiness on feet: Secondary | ICD-10-CM

## 2020-07-23 DIAGNOSIS — R2689 Other abnormalities of gait and mobility: Secondary | ICD-10-CM

## 2020-07-23 NOTE — Therapy (Signed)
[facility] [address] [place], [place], [address] Phone: [phone]   Fax:  [phone]  Physical Therapy Treatment  Patient Details  Name: William Underwood MRN: [id number] Date of Birth: 1951/08/25 Referring Provider (PT): Garvin Fila, MD   Encounter Date: 07/23/2020   PT End of Session - 07/23/20 1449    Visit Number 7    Number of Visits 17    Date for PT Re-Evaluation 09/25/20   written for 60 day POC   Authorization Type HTA 2021 - $15 CO PAY    PT Start Time 0539    PT Stop Time 1528    PT Time Calculation (min) 41 min    Equipment Utilized During Treatment Gait belt    Activity Tolerance Patient tolerated treatment well    Behavior During Therapy [facility] for tasks assessed/performed           Past Medical History:  Diagnosis Date  . Benign prostatic hyperplasia without urinary obstruction 06/17/2014  . BPPV (benign paroxysmal positional vertigo) 07/01/2011  . Chronic hyperkalemia 05/20/2012   Overview:  Hx of for years  . Concussion syndrome 05/15/2011   This seems like a moderately severe concussion with at least 1 minute loss of consciousness in 15-20 minutes of significant confusion including being unable to state his name   . DEGENERATIVE JOINT DISEASE, HANDS 10/16/2009   Qualifier: Diagnosis of  By: [name], [name]    . Diverticulosis 06/20/2017  . Hemorrhoids, external 06/20/2017  . Lumbar spondylosis 06/20/2017   Fairly significant L4-L5 facet arthropathy with effusion.  Can consider facet injections and potentially RFA down the road.  Marland Kitchen NEUROPATHY 03/01/2008   Qualifier: Diagnosis of  By: [name], [name]    . Paroxysmal atrial fibrillation (HCC)   . Pulmonary insufficiency 03/09/2018   moderate PI noted on echo 03/09/18  . Pulmonary vein stenosis    left inferior pulmonary vein stenosis  . Right inguinal hernia 06/17/2014  . Sinus bradycardia   . Strain of gluteus medius 01/29/2017  . Stroke ([place])  05/2020  . Vertigo     Past Surgical History:  Procedure Laterality Date  . ANKLE ARTHROSCOPY    . ATRIAL FIBRILLATION ABLATION N/A 05/25/2018   Procedure: ATRIAL FIBRILLATION ABLATION;  Surgeon: Thompson Grayer, MD;  Location: [facility];  Service: Cardiovascular;  Laterality: N/A;  . KNEE ARTHROSCOPY      There were no vitals filed for this visit.   Subjective Assessment - 07/23/20 1449    Subjective Patient reports that exercises are going well. Had appt with opthamolgist, went well.    Patient is accompained by: --   [name]   Pertinent History BPPV, paroxysmal atrial fibrillation s/p ablation and pulmonary vein stenosis, hx of concussion (2012)    Diagnostic tests Numerous areas of acute infarct. Multiple small infarcts are presentin the posterior circulation including the pons and midbrain in themidline as well as small acute infarcts in the left frontal andparietal cortex. Small acute infarct left thalamus. Findingscompatible with multiple emboli. No hemorrhage.    Patient Stated Goals wants to be able to get back to road cycling.    Currently in Pain? No/denies                             [facility] Adult PT Treatment/Exercise - 07/23/20 0001      Ambulation/Gait   Ambulation/Gait Yes    Ambulation/Gait Assistance 5:  Supervision    Ambulation/Gait Assistance Details completed ambulation on outdoor surfaces with supervision. patient ambulating over grass/pavement surfaces with no instances of instability.     Ambulation Distance (Feet) 115 Feet    Assistive device None    Gait Pattern Step-through pattern    Ambulation Surface Unlevel;Outdoor;Paved;Grass      High Level Balance   High Level Balance Activities Side stepping;Backward walking;Marching forwards;Turns    High Level Balance Comments Outdoors on grass surfaces without AD and CGA completed the following exercises: 2 x 50' backwards walking, 2 x 50' marching forwards, and 2 x 50' of side stepping.  Increased balance challenge with marching forwards and backwards walking on unlevel surface requiring intermittent CGA. Also completed gait with full body turns (including 180 deg and 360 deg), PT calling out direction to turn too, completed x 3 reps to each direction. Increased challenge noted with full turns to L side, patient reporting dysequilibrium and that the eyes take time to catch up further challenging his balance. CGA required throughout.       Neuro Re-ed    Neuro Re-ed Details  Completed ambulation with visual tracking of ball in clockwise/counterclockwise circles, increased balance challenge noted with completion. Completed 2 x 50' of clockwise, followed by 2 x 50' of counterclockwise. CGA required. Patient demo increased sway to L side.            Vestibular Treatment/Exercise - 07/23/20 0001      Vestibular Treatment/Exercise   Gaze Exercises X1 Viewing Horizontal;Comment      X1 Viewing Horizontal   Foot Position standing feet apart    Reps 2    Comments x 45 seconds, no symptoms just report increased "jumping" with gaze to L with R head turn      Eye/Head Exercise Vertical   Comment In seated position: performed compensatory, corrective saccades in sitting with R and L diagonal head movements.  Performed 10 in each direction with verbal cues from therapist.  No dizziness, difficulty with L gaze still noted.                    PT Short Term Goals - 07/05/20 1206      PT SHORT TERM GOAL #1   Title Pt will be independent with initial HEP in order to build upon functional gains made in therapy. ALL STGS DUE 07/26/20    Time 4    Period Weeks    Status New    Target Date 07/26/20      PT SHORT TERM GOAL #2   Title Pt will improve FGA score to at least a 21/30 in order to demo decr fall risk.    Baseline FGA 18/30 on 07/05/20    Time 4    Period Weeks    Status Revised      PT SHORT TERM GOAL #3   Title Pt will decr TUG time to 13.5 seconds or less with no AD  in order to demo decr fall risk.    Baseline 14.97 no AD    Time 4    Period Weeks    Status New             PT Long Term Goals - 07/05/20 1207      PT LONG TERM GOAL #1   Title Pt will be independent with FINAL HEP in order to build upon functional gains made in therapy. ALL LTGS DUE 08/23/20    Time 8    Period  Weeks    Status New      PT LONG TERM GOAL #2   Title Pt will improve FGA score to at least 24/30 in order to demo decr fall risk.    Baseline 18/30    Time 8    Period Weeks    Status Revised      PT LONG TERM GOAL #3   Title Pt will improve gait speed with no AD vs. LRAD to at least 3.0 ft/sec in order to demo improved gait efficiency.    Time 8    Period Weeks    Status New      PT LONG TERM GOAL #4   Title Pt will be able to walk 1,000' outdoors over unlevel surfaces with supervision with no LOB in order to demo improved community mobility with no AD vs. LRAD.    Time 8    Period Weeks    Status New                 Plan - 07/23/20 1612    Clinical Impression Statement Today's skilled PT session included continued work on VOR, progressing as tolerated. Compelted ambulation on outdoor surfaces, along with high level balance on grass surfaces. Patient demo imbalance with marching and walking backwards requiring intermittent CGA. Patient continue to demo increased sway staggering to L side. Will continue to progress toward all goals.    Personal Factors and Comorbidities Comorbidity 3+;Fitness;Past/Current Experience;Behavior Pattern   pt extremely active prior to stroke   Comorbidities BPPV, paroxysmal atrial fibrillation s/p ablation and pulmonary vein stenosis, hx of concussion (2012)    Examination-Activity Limitations Stand;Squat;Transfers;Locomotion Level;Stairs    Examination-Participation Restrictions Community Activity;Driving   running, cycling   Stability/Clinical Decision Making Evolving/Moderate complexity    Rehab Potential Good    PT  Frequency 2x / week    PT Duration 8 weeks    PT Treatment/Interventions ADLs/Self Care Home Management;DME Instruction;Gait training;Stair training;Therapeutic activities;Functional mobility training;Therapeutic exercise;Neuromuscular re-education;Balance training;Patient/family education;Vestibular;Visual/perceptual remediation/compensation    PT Next Visit Plan Complete VOR x 1 (add to HEP). high level balance (SLS, tandem stance, vestibular input for balance), dynamic gait training, hip ABD strengthening    Consulted and Agree with Plan of Care Patient   pt's gf          Patient will benefit from skilled therapeutic intervention in order to improve the following deficits and impairments:  Abnormal gait, Decreased balance, Decreased coordination, Decreased endurance, Decreased safety awareness, Decreased knowledge of use of DME, Decreased strength, Dizziness, Difficulty walking, Impaired vision/preception, Impaired sensation  Visit Diagnosis: Muscle weakness (generalized)  Unsteadiness on feet  Other abnormalities of gait and mobility  Difficulty in walking, not elsewhere classified  Dizziness and giddiness     Problem List Patient Active Problem List   Diagnosis Date Noted  . Stroke ([place]) 06/22/2020  . Arthralgia 07/14/2019  . Pulmonary vein stenosis s/p stenting 2020 01/02/2019  . Chronic neck pain 08/23/2018  . Tinnitus 08/23/2018  . Lipoma 08/23/2018  . Paroxysmal atrial fibrillation ([place]) 05/25/2018  . Diverticulosis 06/20/2017  . Hemorrhoids, external 06/20/2017  . Benign prostatic hyperplasia without urinary obstruction 06/17/2014  . BPPV (benign paroxysmal positional vertigo) 07/01/2011  . DEGENERATIVE JOINT DISEASE, HANDS 10/16/2009  . NEUROPATHY 03/01/2008    Jones Bales, PT, DPT 07/23/2020, 4:16 PM  [facility] [address] [place], [place], [address] Phone: [phone]   Fax:   [phone]  Name: William Underwood MRN: [id number]  Date of Birth: Nov 19, 1950

## 2020-07-24 ENCOUNTER — Encounter: Payer: Self-pay | Admitting: Family Medicine

## 2020-07-26 ENCOUNTER — Ambulatory Visit: Payer: PPO | Admitting: Occupational Therapy

## 2020-07-26 ENCOUNTER — Ambulatory Visit: Payer: PPO | Admitting: Physical Therapy

## 2020-07-26 ENCOUNTER — Other Ambulatory Visit: Payer: Self-pay

## 2020-07-26 DIAGNOSIS — R2681 Unsteadiness on feet: Secondary | ICD-10-CM

## 2020-07-26 DIAGNOSIS — R41842 Visuospatial deficit: Secondary | ICD-10-CM

## 2020-07-26 DIAGNOSIS — R42 Dizziness and giddiness: Secondary | ICD-10-CM

## 2020-07-26 DIAGNOSIS — M6281 Muscle weakness (generalized): Secondary | ICD-10-CM

## 2020-07-26 DIAGNOSIS — R262 Difficulty in walking, not elsewhere classified: Secondary | ICD-10-CM

## 2020-07-26 DIAGNOSIS — R2689 Other abnormalities of gait and mobility: Secondary | ICD-10-CM

## 2020-07-26 NOTE — Therapy (Signed)
[facility] [address] [place], [place], [address] Phone: [phone]   Fax:  [phone]  Occupational Therapy Treatment  Patient Details  Name: William Underwood MRN: [id number] Date of Birth: [birth year] Referring Provider (OT): Dr. Leonie Man   Encounter Date: 07/26/2020   OT End of Session - 07/26/20 1357    Visit Number 2    Number of Visits 17    Date for OT Re-Evaluation 09/11/20    Authorization Type [employer] P[employer]    Authorization Time Period VL: [place] no auth    Authorization - Visit Number 2    Authorization - Number of Visits 10    Progress Note Due on Visit 10    OT Start Time 1145    OT Stop Time 1230    OT Time Calculation (min) 45 min    Activity Tolerance Patient tolerated treatment well    Behavior During Therapy [facility] for tasks assessed/performed           Past Medical History:  Diagnosis Date   Benign prostatic hyperplasia without urinary obstruction 06/17/2014   BPPV (benign paroxysmal positional vertigo) 07/01/2011   Chronic hyperkalemia 05/20/2012   Overview:  Hx of for years   Concussion syndrome 05/15/2011   This seems like a moderately severe concussion with at least 1 minute loss of consciousness in 15-20 minutes of significant confusion including being unable to state his name    DEGENERATIVE JOINT DISEASE, HANDS 10/16/2009   Qualifier: Diagnosis of  By: [name], [name]     Diverticulosis 06/20/2017   Hemorrhoids, external 06/20/2017   Lumbar spondylosis 06/20/2017   Fairly significant L4-L5 facet arthropathy with effusion.  Can consider facet injections and potentially RFA down the road.   NEUROPATHY 03/01/2008   Qualifier: Diagnosis of  By: [name], [name]     Paroxysmal atrial fibrillation ([place])    Pulmonary insufficiency 03/09/2018   moderate PI noted on echo 03/09/18   Pulmonary vein stenosis    left inferior pulmonary vein stenosis   Right inguinal hernia 06/17/2014     Sinus bradycardia    Strain of gluteus medius 01/29/2017   Stroke ([place]) 05/2020   Vertigo     Past Surgical History:  Procedure Laterality Date   ANKLE ARTHROSCOPY     ATRIAL FIBRILLATION ABLATION N/A 05/25/2018   Procedure: ATRIAL FIBRILLATION ABLATION;  Surgeon: Thompson Grayer, MD;  Location: [facility];  Service: Cardiovascular;  Laterality: N/A;   KNEE ARTHROSCOPY      There were no vitals filed for this visit.   Subjective Assessment - 07/26/20 1216    Subjective  I no longer have double vision in the center, but only to Rt and worse to Lt    Pertinent History CVA (Multiple infarcts) 06/22/20 with diplopia. PMH: BPPV, paroxysmal atrial fibrillation s/p ablation and pulmonary vein stenosis, hx of concussion (2012).    Patient Stated Goals Getting back to riding the bike and running and driving. Double Vision.    Currently in Pain? No/denies              [facility] OT Assessment - 07/26/20 0001      Vision Assessment   Comment pt has no diplopia with center vision, mild looking to Rt, worse looking to Lt with ataxic movements Lt eye       Coordination   Gross Motor Movements are Fluid and Coordinated Yes    9 Hole Peg Test Right;Left    Right 9  Hole Peg Test 25.88 sec    Left 9 Hole Peg Test 29.18 sec           See below and pt instructions for details. Cues needed to slow down as pt is impulsive and perform HEP correctly                 OT Education - 07/26/20 1221    Education Details Diplopia HEP    Person(s) Educated Patient    Methods Explanation;Demonstration;Handout;Verbal cues    Comprehension Verbalized understanding;Returned demonstration;Verbal cues required   CUES TO SLOW DOWN           OT Short Term Goals - 07/26/20 1357      OT SHORT TERM GOAL #1   Title Pt will be independent with diplopia HEP 08/13/20    Time 4    Period Weeks    Status On-going    Target Date 08/13/20      OT SHORT TERM GOAL #2   Title Pt will  read large print page of text with partial occlusion PRN with no reports of diplopia    Time 4    Period Weeks    Status New      OT SHORT TERM GOAL #3   Title Pt will complete table top scanning with 75 % accuracy with partial occlusion PRN with no reports of diplopia    Time 4    Period Weeks    Status New      OT SHORT TERM GOAL #4   Title Pt will perform environmental scanning with 75% accuracy in a minimlly distracting environment with minimal reports of diplopia with partial occlusion PRN    Time 4    Period Weeks    Status New      OT SHORT TERM GOAL #5   Title Pt will demonstrate reading of a bill (electric, cable, etc) with minimal errors and no reports of diplopia with partial occlusion PRN to increase independence with financial management.    Time 4    Period Weeks    Status New      OT SHORT TERM GOAL #6   Title Pt will prepare a simple warm prep meal with modified independence while locating all items and with good safety in order to increase independence with home management.    Time 4    Period Weeks    Status New             OT Long Term Goals - 07/16/20 1532      OT LONG TERM GOAL #1   Title Pt will be independent with updated diplopia HEP 08/27/2020    Time 6    Period Weeks    Status New    Target Date 08/27/20      OT LONG TERM GOAL #2   Title Pt will read 12pt or smaller print page of text with partial occlusion PRN with no reports of diplopia    Time 6    Period Weeks    Status New      OT LONG TERM GOAL #3   Title Pt will complete table top scanning with 90 % accuracy with partial occlusion PRN with no reports of diplopia    Time 6    Period Weeks    Status New      OT LONG TERM GOAL #4   Title Pt will perform environmental scanning with 90% accuracy in a minimlly distracting environment with minimal reports of diplopia  with partial occlusion PRN    Time 6    Period Weeks    Status New      OT LONG TERM GOAL #5   Title Pt will  demonstrate reading of a bill (electric, cable, etc) with no errors and no reports of diplopia with partial occlusion PRN to increase independence with financial management.    Time 6    Period Weeks    Status New      Long Term Additional Goals   Additional Long Term Goals Yes      OT LONG TERM GOAL #6   Title Patient will verbalize understanding of compensatory strategies for visual deficits.    Time 6    Period Weeks    Status New                 Plan - 07/26/20 1358    Clinical Impression Statement Pt appears WFL's for BUE gross and fine motor coordination. Pt with worse diplopia and uncoordinated Lt eye movements with scanning and saccades to Lt field    OT Occupational Profile and History Detailed Assessment- Review of Records and additional review of physical, cognitive, psychosocial history related to current functional performance    Occupational performance deficits (Please refer to evaluation for details): Leisure;IADL's;ADL's    Body Structure / Function / Physical Skills ADL;Coordination;UE functional use;Vestibular;Vision;Strength;IADL;Decreased knowledge of use of DME;Decreased knowledge of precautions;ROM;FMC;Dexterity;Balance;Endurance;GMC    Cognitive Skills Attention;Safety Awareness    Rehab Potential Good    Clinical Decision Making Limited treatment options, no task modification necessary    Comorbidities Affecting Occupational Performance: May have comorbidities impacting occupational performance    Modification or Assistance to Complete Evaluation  No modification of tasks or assist necessary to complete eval    OT Frequency 2x / week    OT Duration 8 weeks    OT Treatment/Interventions Self-care/ADL training;Visual/perceptual remediation/compensation;Therapeutic activities;Balance training;Manual Therapy;DME and/or AE instruction    Plan assess balance in more functional context, continue progress towards goals, review diplopia HEP prn    Consulted and  Agree with Plan of Care Patient           Patient will benefit from skilled therapeutic intervention in order to improve the following deficits and impairments:   Body Structure / Function / Physical Skills: ADL, Coordination, UE functional use, Vestibular, Vision, Strength, IADL, Decreased knowledge of use of DME, Decreased knowledge of precautions, ROM, FMC, Dexterity, Balance, Endurance, GMC Cognitive Skills: Attention, Safety Awareness     Visit Diagnosis: Visuospatial deficit    Problem List Patient Active Problem List   Diagnosis Date Noted   Stroke ([place]) 06/22/2020   Arthralgia 07/14/2019   Pulmonary vein stenosis s/p stenting 2020 01/02/2019   Chronic neck pain 08/23/2018   Tinnitus 08/23/2018   Lipoma 08/23/2018   Paroxysmal atrial fibrillation ([place]) 05/25/2018   Diverticulosis 06/20/2017   Hemorrhoids, external 06/20/2017   Benign prostatic hyperplasia without urinary obstruction 06/17/2014   BPPV (benign paroxysmal positional vertigo) 07/01/2011   DEGENERATIVE JOINT DISEASE, HANDS 10/16/2009   NEUROPATHY 03/01/2008    Carey Bullocks, OTR/L 07/26/2020, 2:00 PM  [facility] [address] [place], [place], [address] Phone: [phone]   Fax:  [phone]  Name: William Underwood MRN: [id number] Date of Birth: [birth year]

## 2020-07-26 NOTE — Patient Instructions (Signed)
Access Code: [email or web address] URL: [email or web address] Date: 07/26/2020 Prepared by: Janann August  Exercises Romberg Stance Eyes Closed on Foam Pad - 1 x daily - 7 x weekly - 3 sets - 30 reps Standing Balance with Eyes Closed on Foam - 1 x daily - 7 x weekly - 2 sets - 10 reps Tandem Stance - 1 x daily - 7 x weekly - 3 sets - 10-15 hold Standing Single Leg Stance with Counter Support - 1 x daily - 7 x weekly - 3 sets - 10 hold Tandem Walking - 1 x daily - 7 x weekly - 3 sets Standing Gaze Stabilization with Head Rotation - 1 x daily - 7 x weekly - 3 sets - 45 hold Standing Romberg to 1/2 Tandem Stance - 1 x daily - 7 x weekly - 3 sets - 30 hold

## 2020-07-26 NOTE — Patient Instructions (Signed)
  Diplopia HEP:  Perform at least 3 times per day. Stop if your eye becomes fatigued or hurts and try again later.  1. Hold a small object/card in front of you.  Hold it in the middle at arm's length away.    2. Cover your LEFT eye and look at the object with your RIGHT eye.  3. Slowly move the object side to side in front of you while continuing to watch it with your RIGHT eye.  4.  Remember to keep your head still and only move your eye.  5.  Repeat 5-10 times.  6.  Then, move object up and down while watching it 5-10 times.  7. Then move object to diagonals slowly 5 times each diagonal  8. Cover your RIGHT eye and look at the object with your LEFT eye while you repeat #1-7 above.  9.  Now, uncover both eyes and try to focus on the object while holding it in the middle.  Try to make it 1 image.   10.  If you can, try to hold it for 10-30 sec increasing as able.    11.  Once you can make the image 1 for at least 10 sec in the middle, repeat #1-6 above with both eyes moving slowly and only in the range that you can keep the image 1.   Practice moving from [place] A to [place] B slowly and controlled. Do not move 2 points to far away from center. Repeat 5 times

## 2020-07-26 NOTE — Therapy (Signed)
[facility] [address] [place], [place], [address] Phone: [phone]   Fax:  [phone]  Physical Therapy Treatment  Patient Details  Name: William Underwood MRN: [id number] Date of Birth: [birth year] Referring Provider (PT): Garvin Fila, MD   Encounter Date: 07/26/2020   PT End of Session - 07/26/20 1153    Visit Number 8    Number of Visits 17    Date for PT Re-Evaluation 09/25/20   written for 60 day POC   Authorization Type HTA 2021 - $15 CO PAY    PT Start Time 1100    PT Stop Time 1143    PT Time Calculation (min) 43 min    Equipment Utilized During Treatment Gait belt    Activity Tolerance Patient tolerated treatment well    Behavior During Therapy WFL for tasks assessed/performed           Past Medical History:  Diagnosis Date  . Benign prostatic hyperplasia without urinary obstruction 06/17/2014  . BPPV (benign paroxysmal positional vertigo) 07/01/2011  . Chronic hyperkalemia 05/20/2012   Overview:  Hx of for years  . Concussion syndrome 05/15/2011   This seems like a moderately severe concussion with at least 1 minute loss of consciousness in 15-20 minutes of significant confusion including being unable to state his name   . DEGENERATIVE JOINT DISEASE, HANDS 10/16/2009   Qualifier: Diagnosis of  By: [name], [name]    . Diverticulosis 06/20/2017  . Hemorrhoids, external 06/20/2017  . Lumbar spondylosis 06/20/2017   Fairly significant L4-L5 facet arthropathy with effusion.  Can consider facet injections and potentially RFA down the road.  Marland Kitchen NEUROPATHY 03/01/2008   Qualifier: Diagnosis of  By: [name], [name]    . Paroxysmal atrial fibrillation (HCC)   . Pulmonary insufficiency 03/09/2018   moderate PI noted on echo 03/09/18  . Pulmonary vein stenosis    left inferior pulmonary vein stenosis  . Right inguinal hernia 06/17/2014  . Sinus bradycardia   . Strain of gluteus medius 01/29/2017  . Stroke ([facility])  05/2020  . Vertigo     Past Surgical History:  Procedure Laterality Date  . ANKLE ARTHROSCOPY    . ATRIAL FIBRILLATION ABLATION N/A 05/25/2018   Procedure: ATRIAL FIBRILLATION ABLATION;  Surgeon: Thompson Grayer, MD;  Location: [facility];  Service: Cardiovascular;  Laterality: N/A;  . KNEE ARTHROSCOPY      There were no vitals filed for this visit.   Subjective Assessment - 07/26/20 1103    Subjective Exercises are going well. still having difficulty tracking with L eye.    Patient is accompained by: --   [name]   Pertinent History BPPV, paroxysmal atrial fibrillation s/p ablation and pulmonary vein stenosis, hx of concussion (2012)    Diagnostic tests Numerous areas of acute infarct. Multiple small infarcts are presentin the posterior circulation including the pons and midbrain in themidline as well as small acute infarcts in the left frontal andparietal cortex. Small acute infarct left thalamus. Findingscompatible with multiple emboli. No hemorrhage.    Patient Stated Goals wants to be able to get back to road cycling.    Currently in Pain? No/denies              [facility] PT Assessment - 07/26/20 1107      Timed Up and Go Test   Normal TUG (seconds) 8.22   no AD     Functional Gait  Assessment   Gait assessed  Yes  Gait Level Surface Walks 20 ft in less than 5.5 sec, no assistive devices, good speed, no evidence for imbalance, normal gait pattern, deviates no more than 6 in outside of the 12 in walkway width.   5.4   Change in Gait Speed Able to smoothly change walking speed without loss of balance or gait deviation. Deviate no more than 6 in outside of the 12 in walkway width.    Gait with Horizontal Head Turns Performs head turns smoothly with no change in gait. Deviates no more than 6 in outside 12 in walkway width    Gait with Vertical Head Turns Performs head turns with no change in gait. Deviates no more than 6 in outside 12 in walkway width.    Gait and Pivot Turn  Pivot turns safely within 3 sec and stops quickly with no loss of balance.    Step Over Obstacle Is able to step over 2 stacked shoe boxes taped together (9 in total height) without changing gait speed. No evidence of imbalance.    Gait with Narrow Base of Support Ambulates 4-7 steps.    Gait with Eyes Closed Walks 20 ft, uses assistive device, slower speed, mild gait deviations, deviates 6-10 in outside 12 in walkway width. Ambulates 20 ft in less than 9 sec but greater than 7 sec.   veers to L, 6.59 seconds   Ambulating Backwards Walks 20 ft, no assistive devices, good speed, no evidence for imbalance, normal gait    Steps Alternating feet, no rail.    Total Score 27    FGA comment: 27/30                          Vestibular Treatment/Exercise - 07/26/20 0001      X1 Viewing Horizontal   Foot Position standing feet apart    Reps 3    Comments 2 x 30 seconds, x45 seconds , no reports of dizziness, reports incr jumping with gaze to L               Access Code: [email or web address] URL: [email or web address] Date: 07/26/2020 Prepared by: Janann August  Progressed/added bolded exercises listed below:   Exercises Romberg Stance Eyes Closed on Foam Pad - 1 x daily - 7 x weekly - 3 sets - 30 reps Standing Balance with Eyes Closed on Foam - 1 x daily - 7 x weekly - 2 sets - 10 reps - progressed to more narrow BOS with head turns 2 x 10 reps, and head nods 2 x 10 reps  Tandem Stance - 1 x daily - 7 x weekly - 3 sets - 10-15 hold Standing Single Leg Stance with Counter Support - 1 x daily - 7 x weekly - 3 sets - 10 hold Tandem Walking - 1 x daily - 7 x weekly - 3 sets Standing Gaze Stabilization with Head Rotation - 1 x daily - 7 x weekly - 3 sets - 45 hold Standing Romberg to 1/2 Tandem Stance - 1 x daily - 7 x weekly - 3 sets - 30 hold - on 2 pillows with eyes closed.       PT Short Term Goals - 07/26/20 1114      PT SHORT TERM GOAL #1   Title Pt will be  independent with initial HEP in order to build upon functional gains made in therapy. ALL STGS DUE 07/26/20    Time 4    Period Weeks  Status Achieved    Target Date 07/26/20      PT SHORT TERM GOAL #2   Title Pt will improve FGA score to at least a 21/30 in order to demo decr fall risk.    Baseline FGA 18/30 on 07/05/20, 27/30    Time 4    Period Weeks    Status Achieved      PT SHORT TERM GOAL #3   Title Pt will decr TUG time to 13.5 seconds or less with no AD in order to demo decr fall risk.    Baseline 14.97 no AD, 8.22 SECONDS ON 07/26/20    Time 4    Period Weeks    Status Achieved             PT Long Term Goals - 07/26/20 1115      PT LONG TERM GOAL #1   Title Pt will be independent with FINAL HEP in order to build upon functional gains made in therapy. ALL LTGS DUE 08/23/20    Time 8    Period Weeks    Status New      PT LONG TERM GOAL #2   Title Pt will improve FGA score to at least 29/30 in order to demo decr fall risk.    Baseline 27/30    Time 8    Period Weeks    Status Revised      PT LONG TERM GOAL #3   Title Pt will improve gait speed with no AD vs. LRAD to at least 3.0 ft/sec in order to demo improved gait efficiency.    Time 8    Period Weeks    Status New      PT LONG TERM GOAL #4   Title Pt will be able to walk 1,000' outdoors over unlevel surfaces with supervision with no LOB in order to demo improved community mobility with no AD vs. LRAD.    Time 8    Period Weeks    Status New                 Plan - 07/26/20 1152    Clinical Impression Statement Focus of today's skilled session was assessing pt's STGs - with pt meeting 3 out of 3. Pt improved TUG time to 8.22 seconds with no AD, indicating pt is no longer at a risk of falls. Pt improved FGA score to a 27/30 (previously 18/30) - indicating pt is now at a low risk of falls. Remainder of session focused on upgrading pt's HEP to include balance with vision removed and VOR X1 in standing.  Will continue to progress towards LTGs.    Personal Factors and Comorbidities Comorbidity 3+;Fitness;Past/Current Experience;Behavior Pattern   pt extremely active prior to stroke   Comorbidities BPPV, paroxysmal atrial fibrillation s/p ablation and pulmonary vein stenosis, hx of concussion (2012)    Examination-Activity Limitations Stand;Squat;Transfers;Locomotion Level;Stairs    Examination-Participation Restrictions Community Activity;Driving   running, cycling   Stability/Clinical Decision Making Evolving/Moderate complexity    Rehab Potential Good    PT Frequency 2x / week    PT Duration 8 weeks    PT Treatment/Interventions ADLs/Self Care Home Management;DME Instruction;Gait training;Stair training;Therapeutic activities;Functional mobility training;Therapeutic exercise;Neuromuscular re-education;Balance training;Patient/family education;Vestibular;Visual/perceptual remediation/compensation    PT Next Visit Plan progress VOR x1. high level balance (SLS, tandem stance, vestibular input for balance), dynamic gait training, hip ABD strengthening    Consulted and Agree with Plan of Care Patient   pt's gf  Patient will benefit from skilled therapeutic intervention in order to improve the following deficits and impairments:  Abnormal gait, Decreased balance, Decreased coordination, Decreased endurance, Decreased safety awareness, Decreased knowledge of use of DME, Decreased strength, Dizziness, Difficulty walking, Impaired vision/preception, Impaired sensation  Visit Diagnosis: Muscle weakness (generalized)  Unsteadiness on feet  Other abnormalities of gait and mobility  Difficulty in walking, not elsewhere classified  Dizziness and giddiness     Problem List Patient Active Problem List   Diagnosis Date Noted  . Stroke ([facility]) 06/22/2020  . Arthralgia 07/14/2019  . Pulmonary vein stenosis s/p stenting 2020 01/02/2019  . Chronic neck pain 08/23/2018  . Tinnitus 08/23/2018   . Lipoma 08/23/2018  . Paroxysmal atrial fibrillation ([facility]) 05/25/2018  . Diverticulosis 06/20/2017  . Hemorrhoids, external 06/20/2017  . Benign prostatic hyperplasia without urinary obstruction 06/17/2014  . BPPV (benign paroxysmal positional vertigo) 07/01/2011  . DEGENERATIVE JOINT DISEASE, HANDS 10/16/2009  . NEUROPATHY 03/01/2008    Arliss Journey, PT, DPT  07/26/2020, 11:55 AM  [facility] [address] [place], [place], [address] Phone: [phone]   Fax:  [phone]  Name: William Underwood MRN: [id number] Date of Birth: July 31, 1951

## 2020-07-27 ENCOUNTER — Ambulatory Visit: Payer: PPO | Admitting: Physical Therapy

## 2020-07-30 ENCOUNTER — Ambulatory Visit: Payer: PPO | Attending: Neurology | Admitting: Physical Therapy

## 2020-07-30 ENCOUNTER — Other Ambulatory Visit: Payer: Self-pay

## 2020-07-30 VITALS — BP 118/73 | HR 50

## 2020-07-30 DIAGNOSIS — R278 Other lack of coordination: Secondary | ICD-10-CM | POA: Diagnosis not present

## 2020-07-30 DIAGNOSIS — R2681 Unsteadiness on feet: Secondary | ICD-10-CM

## 2020-07-30 DIAGNOSIS — R262 Difficulty in walking, not elsewhere classified: Secondary | ICD-10-CM

## 2020-07-30 DIAGNOSIS — M6281 Muscle weakness (generalized): Secondary | ICD-10-CM | POA: Diagnosis not present

## 2020-07-30 DIAGNOSIS — R2689 Other abnormalities of gait and mobility: Secondary | ICD-10-CM | POA: Diagnosis not present

## 2020-07-30 DIAGNOSIS — I69351 Hemiplegia and hemiparesis following cerebral infarction affecting right dominant side: Secondary | ICD-10-CM | POA: Insufficient documentation

## 2020-07-30 DIAGNOSIS — R42 Dizziness and giddiness: Secondary | ICD-10-CM | POA: Diagnosis not present

## 2020-07-30 DIAGNOSIS — R41842 Visuospatial deficit: Secondary | ICD-10-CM | POA: Insufficient documentation

## 2020-07-30 IMAGING — CT CT HEART MORP W/ CTA COR W/ SCORE W/ CA W/CM &/OR W/O CM
4 of 7 series · 8 of 20 positions shown, 9 images · non-contrast
Comparison: Pulmonary veins CTA from 05/19/2018.
COMPARISON: 05/19/2018

CLINICAL DATA: 66-year old male with h/o paroxysmal atrial
fibrillation, s/p recent ablation with Nzo S[place].

EXAM:
Cardiac/Coronary  CT
TECHNIQUE: The patient was scanned on a [employer] Force scanner.

[Series 6: best diast 73 % · axial · 0.31mm/px · z∈[-146,-106]mm · 2 of 298 slices shown, 3 images]
[im 100/298  vessel]
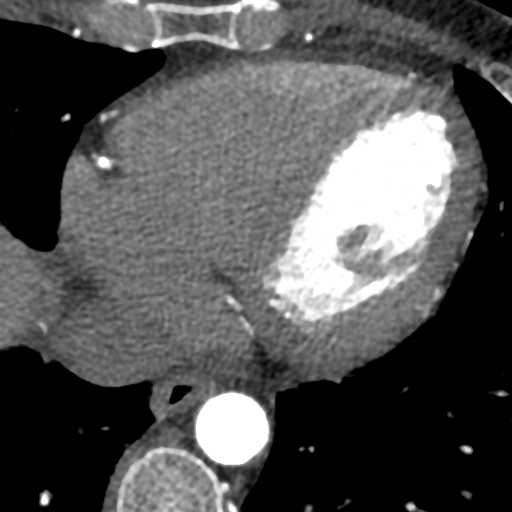
[im 100/298  lung]
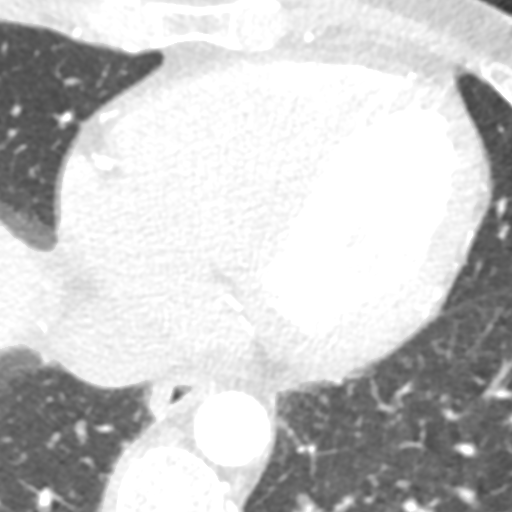
[im 199/298  vessel]
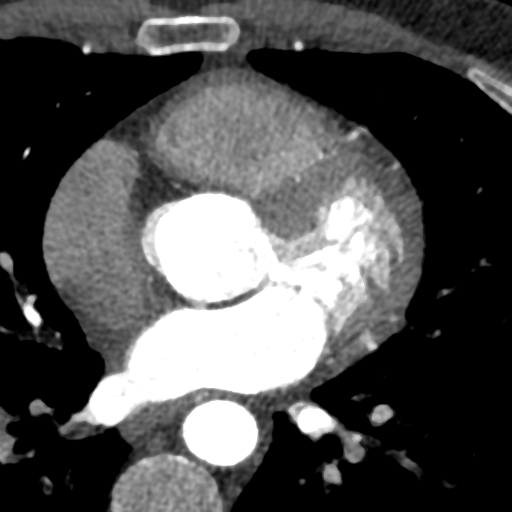

[Series 7: best syst 32 % · axial · 0.31mm/px · z∈[-146,-106]mm · 2 of 298 slices shown]
[im 100/298  vessel]
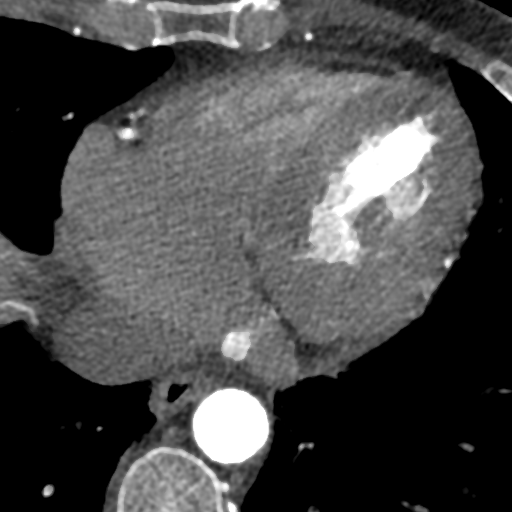
[im 199/298  vessel]
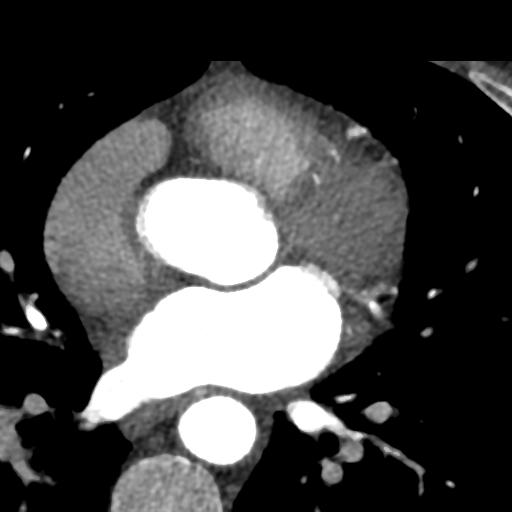

[Series 8: ts diast sharp 73 % · axial · 0.31mm/px · z∈[-146,-106]mm · 2 of 298 slices shown]
[im 100/298  lung]
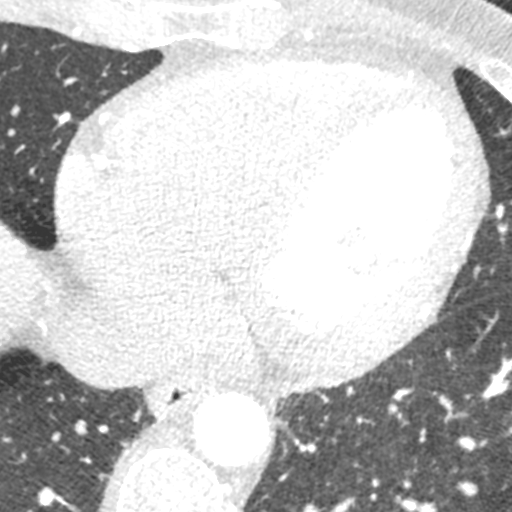
[im 199/298  lung]
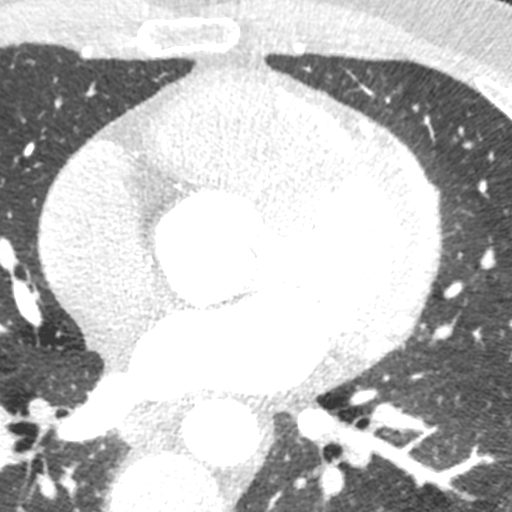

[Series 9: ts syst sharp 32 % · axial · 0.31mm/px · z∈[-146,-106]mm · 2 of 298 slices shown]
[im 100/298  lung]
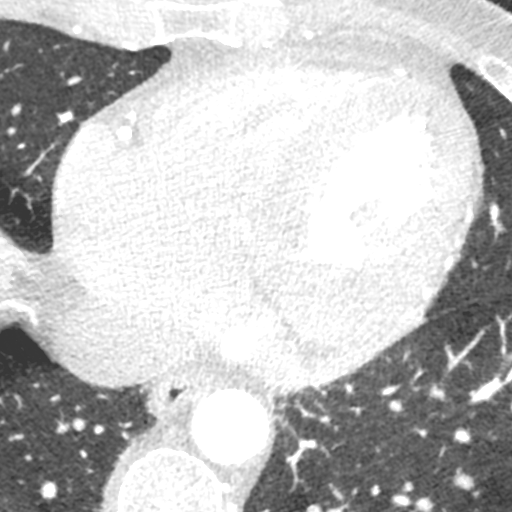
[im 199/298  lung]
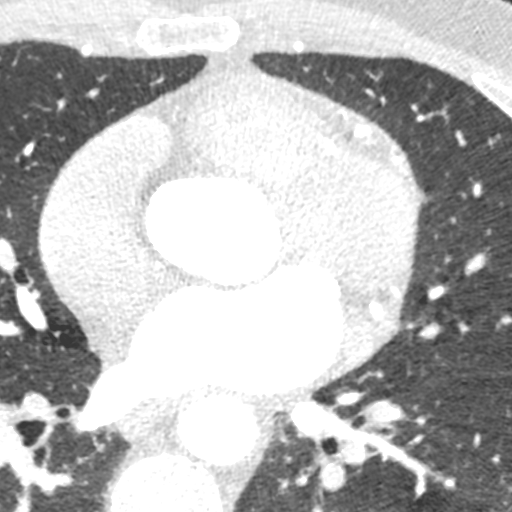

[8 of 20 positions shown; findings below may reference images not displayed]



Aorta: Normal size. Minimal atheroma, no calcifications. No
dissection.

Aortic Valve:  Trileaflet.  No calcifications.

Coronary Arteries:  Normal coronary origin.  Right dominance.

RCA is a large dominant artery that gives rise to PDA and PLVB.
There is mild calcified plaque in the proximal portion with
associated stenosis 25-50%.

Left main is a large artery that gives rise to LAD and LCX arteries.
Left main has no plaque.

LAD is a large vessel that gives rise to one diagonal artery. There
is mild calcified plaque in the proximal portion with associated
stenosis 25-50%. Mid LAD has minimal non-calcified plaque with
stenosis 0-25%.

LCX is a non-dominant artery that gives rise to one OM1 branch.
There is minimal calcified plaque in the proximal portion with
associated stenosis 0-25%.

There is normal pulmonary vein drainage into the left atrium (2 on
the right and 2 on the left) with ostial measurements as follows:

RUPV: 19.5 x 15.6 mm, 2.43 cm2 (previously 20.0 x 19.0 mm, 2.82 cm2)

RLPV: 16.1 x 13.6 mm, 1.69 cm2 (previously 20.0 x 17.4 mm, 2.64 cm2)

LUPV: 16.4 x 10.9 mm, 1.41 cm2 (previously 19.5 x 13.1 mm, 1.95 cm2)

LLPV: 10.0 x 7.9 mm, 0.63 cm2 (previously 19.8 x 16.1 mm, 2.5 cm2)

There is no thrombus in the left atrial appendage.

Other findings:

Normal let atrial appendage without a thrombus.

Normal size of the pulmonary artery.
IMPRESSION: 1. Coronary calcium score of 194. This was 64 percentile for age and
sex matched control.

2. Normal coronary origin with right dominance.

3. Mild non-obstructive CAD.

4. There is normal pulmonary vein drainage into the left atrium (2
on the right and 2 on the left). Left lower pulmonary vein is
significantly smaller and fits the criteria of pulmonary vein
stenosis (defined as > 50% decrease in size).

EXAM:
OVER-READ INTERPRETATION  CT CHEST

The following report is an over-read performed by radiologist Dr.
Aleksandar Patlan [REDACTED] on 07/19/2018. This over-read
does not include interpretation of cardiac or coronary anatomy or
pathology. The coronary calcium score/coronary CTA interpretation by
the cardiologist is attached.
FINDINGS: Vascular: Normal caliber of the visualized thoracic aorta. There
appears to be minimal mural thrombus along the left side of the
descending thoracic aorta. No significant pericardial fluid. Limited
evaluation of the pulmonary arteries.

Mediastinum/Nodes: Visualized esophagus is unremarkable. No
significant chest lymphadenopathy.

Lungs/Pleura: No large effusions. Dependent atelectasis at the lung
bases. Otherwise, the lungs are clear.

Upper Abdomen: Unremarkable.

Musculoskeletal: Unremarkable.
IMPRESSION: Negative over-read examination.

## 2020-07-30 NOTE — Therapy (Signed)
[facility] [address] [place], [place], [address] Phone: [phone]   Fax:  [phone]  Physical Therapy Treatment  Patient Details  Name: William Underwood MRN: [id number] Date of Birth: 10-Sep-1951 Referring Provider (PT): Garvin Fila, MD   Encounter Date: 07/30/2020   PT End of Session - 07/30/20 1536    Visit Number 9    Number of Visits 17    Date for PT Re-Evaluation 09/25/20   written for 60 day POC   Authorization Type HTA 2021 - $15 CO PAY    PT Start Time 1446    PT Stop Time 1527    PT Time Calculation (min) 41 min    Equipment Utilized During Treatment Gait belt    Activity Tolerance Patient tolerated treatment well    Behavior During Therapy WFL for tasks assessed/performed           Past Medical History:  Diagnosis Date  . Benign prostatic hyperplasia without urinary obstruction 06/17/2014  . BPPV (benign paroxysmal positional vertigo) 07/01/2011  . Chronic hyperkalemia 05/20/2012   Overview:  Hx of for years  . Concussion syndrome 05/15/2011   This seems like a moderately severe concussion with at least 1 minute loss of consciousness in 15-20 minutes of significant confusion including being unable to state his name   . DEGENERATIVE JOINT DISEASE, HANDS 10/16/2009   Qualifier: Diagnosis of  By: [name], [name]    . Diverticulosis 06/20/2017  . Hemorrhoids, external 06/20/2017  . Lumbar spondylosis 06/20/2017   Fairly significant L4-L5 facet arthropathy with effusion.  Can consider facet injections and potentially RFA down the road.  Marland Kitchen NEUROPATHY 03/01/2008   Qualifier: Diagnosis of  By: [name], [name]    . Paroxysmal atrial fibrillation (HCC)   . Pulmonary insufficiency 03/09/2018   moderate PI noted on echo 03/09/18  . Pulmonary vein stenosis    left inferior pulmonary vein stenosis  . Right inguinal hernia 06/17/2014  . Sinus bradycardia   . Strain of gluteus medius 01/29/2017  . Stroke ([facility])  05/2020  . Vertigo     Past Surgical History:  Procedure Laterality Date  . ANKLE ARTHROSCOPY    . ATRIAL FIBRILLATION ABLATION N/A 05/25/2018   Procedure: ATRIAL FIBRILLATION ABLATION;  Surgeon: Thompson Grayer, MD;  Location: [facility];  Service: Cardiovascular;  Laterality: N/A;  . KNEE ARTHROSCOPY      Vitals:   07/30/20 1450  BP: 118/73  Pulse: (!) 50     Subjective Assessment - 07/30/20 1448    Subjective Reports that the eyes feel like they are floating more today. Did some exercises and thinks his eyes are just more fatigued.    Patient is accompained by: --   [name]   Pertinent History BPPV, paroxysmal atrial fibrillation s/p ablation and pulmonary vein stenosis, hx of concussion (2012)    Diagnostic tests Numerous areas of acute infarct. Multiple small infarcts are presentin the posterior circulation including the pons and midbrain in themidline as well as small acute infarcts in the left frontal andparietal cortex. Small acute infarct left thalamus. Findingscompatible with multiple emboli. No hemorrhage.    Patient Stated Goals wants to be able to get back to road cycling.    Currently in Pain? No/denies                                  Balance Exercises -  07/30/20 0001      Balance Exercises: Standing   Standing Eyes Closed Foam/compliant surface;3 reps;30 secs    Standing Eyes Closed Limitations on rockerboard in A/P direction, intermittent UE support on // bars, cues to keep board steady    SLS Eyes open;Foam/compliant surface    SLS Time on rockerboard stepping on with single leg in A/P direction and then tapping opposite foot to forward cone and then stepping off board, x12 reps B, cues for slowed and controlled, intermittent UE support    Rockerboard Limitations;Anterior/posterior    Rockerboard Limitations standing in A/P direction:  Holding ball and moving in a circle performing visual tracking in clockwise directions 2 x 10 reps,  and then counterclockwise x10 reps, pt improving with incr reps, min A at times for balance    Marching Solid surface;Limitations    Marching Limitations with green tband resistance around thighs for resisted SLS, 2 x 50' slowed and controlled marching, then adding 2 x 50' of alternating UE lifts for coordination, min A at times for balance, cues for slowed and controlled    Other Standing Exercises on black side of BOSU: weight shifting R/L x12 reps, progressing to no UE support, x10 reps mini squats no UE support - cues for technique    Other Standing Exercises Comments on blue foam beam:  Keeping one foot still and stepping opposite foot forwards and back, fingertip support x10 reps at countertop            PT Education - 07/30/20 1536    Education Details gave pt name and phone numbers of Neuro-ophthalmologists    Person(s) Educated Patient    Methods Explanation;Handout    Comprehension Verbalized understanding            PT Short Term Goals - 07/26/20 1114      PT SHORT TERM GOAL #1   Title Pt will be independent with initial HEP in order to build upon functional gains made in therapy. ALL STGS DUE 07/26/20    Time 4    Period Weeks    Status Achieved    Target Date 07/26/20      PT SHORT TERM GOAL #2   Title Pt will improve FGA score to at least a 21/30 in order to demo decr fall risk.    Baseline FGA 18/30 on 07/05/20, 27/30    Time 4    Period Weeks    Status Achieved      PT SHORT TERM GOAL #3   Title Pt will decr TUG time to 13.5 seconds or less with no AD in order to demo decr fall risk.    Baseline 14.97 no AD, 8.22 SECONDS ON 07/26/20    Time 4    Period Weeks    Status Achieved             PT Long Term Goals - 07/26/20 1115      PT LONG TERM GOAL #1   Title Pt will be independent with FINAL HEP in order to build upon functional gains made in therapy. ALL LTGS DUE 08/23/20    Time 8    Period Weeks    Status New      PT LONG TERM GOAL #2   Title Pt  will improve FGA score to at least 29/30 in order to demo decr fall risk.    Baseline 27/30    Time 8    Period Weeks    Status Revised  PT LONG TERM GOAL #3   Title Pt will improve gait speed with no AD vs. LRAD to at least 3.0 ft/sec in order to demo improved gait efficiency.    Time 8    Period Weeks    Status New      PT LONG TERM GOAL #4   Title Pt will be able to walk 1,000' outdoors over unlevel surfaces with supervision with no LOB in order to demo improved community mobility with no AD vs. LRAD.    Time 8    Period Weeks    Status New                 Plan - 07/30/20 1537    Clinical Impression Statement Today's skilled session continued to focus on balance on compliant surfaces - SLS, vision removed, narrow BOS, and BLE strenghtening. Pt tolerated session well, intermittent standing rest breaks due to fatigue. Continues to be challenged by SLS activities and needs cues throughout session to slow down. Will continue to progress towards LTGs.    Personal Factors and Comorbidities Comorbidity 3+;Fitness;Past/Current Experience;Behavior Pattern   pt extremely active prior to stroke   Comorbidities BPPV, paroxysmal atrial fibrillation s/p ablation and pulmonary vein stenosis, hx of concussion (2012)    Examination-Activity Limitations Stand;Squat;Transfers;Locomotion Level;Stairs    Examination-Participation Restrictions Community Activity;Driving   running, cycling   Stability/Clinical Decision Making Evolving/Moderate complexity    Rehab Potential Good    PT Frequency 2x / week    PT Duration 8 weeks    PT Treatment/Interventions ADLs/Self Care Home Management;DME Instruction;Gait training;Stair training;Therapeutic activities;Functional mobility training;Therapeutic exercise;Neuromuscular re-education;Balance training;Patient/family education;Vestibular;Visual/perceptual remediation/compensation    PT Next Visit Plan progress VOR x1 (try on a compliant surface) high  level balance (SLS, tandem stance, vestibular input for balance), dynamic gait training, hip ABD strengthening    Consulted and Agree with Plan of Care Patient   pt's gf          Patient will benefit from skilled therapeutic intervention in order to improve the following deficits and impairments:  Abnormal gait, Decreased balance, Decreased coordination, Decreased endurance, Decreased safety awareness, Decreased knowledge of use of DME, Decreased strength, Dizziness, Difficulty walking, Impaired vision/preception, Impaired sensation  Visit Diagnosis: Muscle weakness (generalized)  Unsteadiness on feet  Other abnormalities of gait and mobility  Difficulty in walking, not elsewhere classified     Problem List Patient Active Problem List   Diagnosis Date Noted  . Stroke ([facility]) 06/22/2020  . Arthralgia 07/14/2019  . Pulmonary vein stenosis s/p stenting 2020 01/02/2019  . Chronic neck pain 08/23/2018  . Tinnitus 08/23/2018  . Lipoma 08/23/2018  . Paroxysmal atrial fibrillation ([facility]) 05/25/2018  . Diverticulosis 06/20/2017  . Hemorrhoids, external 06/20/2017  . Benign prostatic hyperplasia without urinary obstruction 06/17/2014  . BPPV (benign paroxysmal positional vertigo) 07/01/2011  . DEGENERATIVE JOINT DISEASE, HANDS 10/16/2009  . NEUROPATHY 03/01/2008    Arliss Journey, PT, DPT  07/30/2020, 3:39 PM  [facility] [address] [place], [place], [address] Phone: [phone]   Fax:  [phone]  Name: William Underwood MRN: [id number] Date of Birth: [birth year]

## 2020-08-01 ENCOUNTER — Ambulatory Visit: Payer: PPO | Admitting: Physical Therapy

## 2020-08-01 ENCOUNTER — Other Ambulatory Visit: Payer: Self-pay

## 2020-08-01 DIAGNOSIS — M6281 Muscle weakness (generalized): Secondary | ICD-10-CM | POA: Diagnosis not present

## 2020-08-01 DIAGNOSIS — R2689 Other abnormalities of gait and mobility: Secondary | ICD-10-CM

## 2020-08-01 DIAGNOSIS — R262 Difficulty in walking, not elsewhere classified: Secondary | ICD-10-CM

## 2020-08-01 DIAGNOSIS — R2681 Unsteadiness on feet: Secondary | ICD-10-CM

## 2020-08-01 NOTE — Therapy (Signed)
[facility] [address] [place], [place], [address] Phone: [phone]   Fax:  [phone]  Physical Therapy Treatment/10th Visit Progress Note  Patient Details  Name: William Underwood MRN: [id number] Date of Birth: June 12, 1951 Referring Provider (PT): Garvin Fila, MD   10th Visit Physical Therapy Progress Note  Dates of Reporting Period: 07/05/20 to 08/01/20    Encounter Date: 08/01/2020   PT End of Session - 08/01/20 1527    Visit Number 10    Number of Visits 17    Date for PT Re-Evaluation 09/25/20   written for 60 day POC   Authorization Type HTA 2021 - $15 CO PAY    PT Start Time 1447    PT Stop Time 1526    PT Time Calculation (min) 39 min    Equipment Utilized During Treatment Gait belt    Activity Tolerance Patient tolerated treatment well    Behavior During Therapy [facility] for tasks assessed/performed           Past Medical History:  Diagnosis Date  . Benign prostatic hyperplasia without urinary obstruction 06/17/2014  . BPPV (benign paroxysmal positional vertigo) 07/01/2011  . Chronic hyperkalemia 05/20/2012   Overview:  Hx of for years  . Concussion syndrome 05/15/2011   This seems like a moderately severe concussion with at least 1 minute loss of consciousness in 15-20 minutes of significant confusion including being unable to state his name   . DEGENERATIVE JOINT DISEASE, HANDS 10/16/2009   Qualifier: Diagnosis of  By: [name], [name]    . Diverticulosis 06/20/2017  . Hemorrhoids, external 06/20/2017  . Lumbar spondylosis 06/20/2017   Fairly significant L4-L5 facet arthropathy with effusion.  Can consider facet injections and potentially RFA down the road.  Marland Kitchen NEUROPATHY 03/01/2008   Qualifier: Diagnosis of  By: [name], [name]    . Paroxysmal atrial fibrillation (HCC)   . Pulmonary insufficiency 03/09/2018   moderate PI noted on echo 03/09/18  . Pulmonary vein stenosis    left inferior pulmonary vein  stenosis  . Right inguinal hernia 06/17/2014  . Sinus bradycardia   . Strain of gluteus medius 01/29/2017  . Stroke ([place]) 05/2020  . Vertigo     Past Surgical History:  Procedure Laterality Date  . ANKLE ARTHROSCOPY    . ATRIAL FIBRILLATION ABLATION N/A 05/25/2018   Procedure: ATRIAL FIBRILLATION ABLATION;  Surgeon: Thompson Grayer, MD;  Location: [facility];  Service: Cardiovascular;  Laterality: N/A;  . KNEE ARTHROSCOPY      There were no vitals filed for this visit.   Subjective Assessment - 08/01/20 1448    Subjective Was tired after last session. Called the neuro-opth in [place] - waiting to hear back.    Patient is accompained by: --   gf William Underwood   Pertinent History BPPV, paroxysmal atrial fibrillation s/p ablation and pulmonary vein stenosis, hx of concussion (2012)    Diagnostic tests Numerous areas of acute infarct. Multiple small infarcts are presentin the posterior circulation including the pons and midbrain in themidline as well as small acute infarcts in the left frontal andparietal cortex. Small acute infarct left thalamus. Findingscompatible with multiple emboli. No hemorrhage.    Patient Stated Goals wants to be able to get back to road cycling.    Currently in Pain? No/denies  Balance Exercises - 08/01/20 1504      Balance Exercises: Standing   Standing Eyes Closed Narrow base of support (BOS);Foam/compliant surface;Limitations    Standing Eyes Closed Limitations on rockerboard in A/P direction: 3 x 15 seconds, on blue air ex with feet together x20 seconds, 2 x 30 seconds, on air ex feet apart eyes closed: 2 x 10 reps head turns, 2 x 10 reps head nods    SLS Eyes open;Foam/compliant surface    SLS Time standing on blue air ex for SLS with 2nd person assist rolling ball: pt kicking soccer ball x8 reps B, min A for balance with UE support at times at [place];Eyes opened;Eyes  closed;Limitations    Wall Bumps Limitations in A/P direction on rockerboard x5 reps eyes open - cues for hip strategy and then for incr vestibular input performed with eyes closed x10 reps with intermittent UE support and min guard    Rockerboard Anterior/posterior;Limitations    Rockerboard Limitations performing ball toss with 1lb ball with another PT and doing a mini squat before tossing ball x12 reps - improvement with incr reps    Tandem Gait Forward;Foam/compliant surface    Tandem Gait Limitations down and back 4 reps on blue foam beam, cues for slowed and controlled, intermittent UE support and min A for balance     Other Standing Exercises stepping on stepping stones for SLS: down and back x4 reps , min A at times for balance, cues for slowed and controlled               PT Short Term Goals - 07/26/20 1114      PT SHORT TERM GOAL #1   Title Pt will be independent with initial HEP in order to build upon functional gains made in therapy. ALL STGS DUE 07/26/20    Time 4    Period Weeks    Status Achieved    Target Date 07/26/20      PT SHORT TERM GOAL #2   Title Pt will improve FGA score to at least a 21/30 in order to demo decr fall risk.    Baseline FGA 18/30 on 07/05/20, 27/30    Time 4    Period Weeks    Status Achieved      PT SHORT TERM GOAL #3   Title Pt will decr TUG time to 13.5 seconds or less with no AD in order to demo decr fall risk.    Baseline 14.97 no AD, 8.22 SECONDS ON 07/26/20    Time 4    Period Weeks    Status Achieved              PT Long Term Goals - 07/26/20 1115      PT LONG TERM GOAL #1   Title Pt will be independent with FINAL HEP in order to build upon functional gains made in therapy. ALL LTGS DUE 08/23/20    Time 8    Period Weeks    Status New      PT LONG TERM GOAL #2   Title Pt will improve FGA score to at least 29/30 in order to demo decr fall risk.    Baseline 27/30    Time 8    Period Weeks    Status Revised      PT  LONG TERM GOAL #3   Title Pt will improve gait speed with no AD vs. LRAD to at least 3.0 ft/sec in order  to demo improved gait efficiency.    Time 8    Period Weeks    Status New      PT LONG TERM GOAL #4   Title Pt will be able to walk 1,000' outdoors over unlevel surfaces with supervision with no LOB in order to demo improved community mobility with no AD vs. LRAD.    Time 8    Period Weeks    Status New                 Plan - 08/01/20 1840    Clinical Impression Statement 10th visit PN: STGs assessed on 07/26/20 - with pt achieving 3 out of 3 STGs. Pt improved FGA score to a 27/30 (previously 18/30), indicating pt is now at a low risk of falls. Today's skilled session focused on balance strategies on compliant surfaces with pt continuing to have difficulty with SLS and balance with vision removed. Pt is making excellent progress with PT, will continue to progress towards LTGs.    Personal Factors and Comorbidities Comorbidity 3+;Fitness;Past/Current Experience;Behavior Pattern   pt extremely active prior to stroke   Comorbidities BPPV, paroxysmal atrial fibrillation s/p ablation and pulmonary vein stenosis, hx of concussion (2012)    Examination-Activity Limitations Stand;Squat;Transfers;Locomotion Level;Stairs    Examination-Participation Restrictions Community Activity;Driving   running, cycling   Stability/Clinical Decision Making Evolving/Moderate complexity    Rehab Potential Good    PT Frequency 2x / week    PT Duration 8 weeks    PT Treatment/Interventions ADLs/Self Care Home Management;DME Instruction;Gait training;Stair training;Therapeutic activities;Functional mobility training;Therapeutic exercise;Neuromuscular re-education;Balance training;Patient/family education;Vestibular;Visual/perceptual remediation/compensation    PT Next Visit Plan visual tracking incorporated into balance. progress VOR x1 (try on a compliant surface) high level balance (SLS, tandem stance,  vestibular input for balance), dynamic gait training, hip ABD strengthening    Consulted and Agree with Plan of Care Patient   pt's gf          Patient will benefit from skilled therapeutic intervention in order to improve the following deficits and impairments:  Abnormal gait, Decreased balance, Decreased coordination, Decreased endurance, Decreased safety awareness, Decreased knowledge of use of DME, Decreased strength, Dizziness, Difficulty walking, Impaired vision/preception, Impaired sensation  Visit Diagnosis: Muscle weakness (generalized)  Unsteadiness on feet  Other abnormalities of gait and mobility  Difficulty in walking, not elsewhere classified     Problem List Patient Active Problem List   Diagnosis Date Noted  . Stroke ([place]) 06/22/2020  . Arthralgia 07/14/2019  . Pulmonary vein stenosis s/p stenting 2020 01/02/2019  . Chronic neck pain 08/23/2018  . Tinnitus 08/23/2018  . Lipoma 08/23/2018  . Paroxysmal atrial fibrillation ([place]) 05/25/2018  . Diverticulosis 06/20/2017  . Hemorrhoids, external 06/20/2017  . Benign prostatic hyperplasia without urinary obstruction 06/17/2014  . BPPV (benign paroxysmal positional vertigo) 07/01/2011  . DEGENERATIVE JOINT DISEASE, HANDS 10/16/2009  . NEUROPATHY 03/01/2008    Arliss Journey, PT, DPT  08/01/2020, 6:41 PM  [facility] [address] [place], [place], [address] Phone: [phone]   Fax:  [phone]  Name: William Underwood MRN: [id number] Date of Birth: 1951-01-14

## 2020-08-02 ENCOUNTER — Ambulatory Visit: Payer: PPO | Admitting: Occupational Therapy

## 2020-08-02 ENCOUNTER — Encounter: Payer: Self-pay | Admitting: Occupational Therapy

## 2020-08-02 DIAGNOSIS — M6281 Muscle weakness (generalized): Secondary | ICD-10-CM

## 2020-08-02 DIAGNOSIS — R41842 Visuospatial deficit: Secondary | ICD-10-CM

## 2020-08-02 DIAGNOSIS — R278 Other lack of coordination: Secondary | ICD-10-CM

## 2020-08-02 DIAGNOSIS — R2681 Unsteadiness on feet: Secondary | ICD-10-CM

## 2020-08-02 DIAGNOSIS — R2689 Other abnormalities of gait and mobility: Secondary | ICD-10-CM

## 2020-08-02 NOTE — Therapy (Signed)
[facility] [address] [place], [place], [address] Phone: [phone]   Fax:  [phone]  Occupational Therapy Treatment  Patient Details  Name: William Underwood MRN: [id number] Date of Birth: 04-22-1951 Referring Provider (OT): Dr. Leonie Man   Encounter Date: 08/02/2020   OT End of Session - 08/02/20 1704    Visit Number 3    Number of Visits 17    Date for OT Re-Evaluation 09/11/20    Authorization Type [employer]    Authorization Time Period VL: [place] no auth Week 2/8 (10/7)    Authorization - Visit Number 3    Authorization - Number of Visits 10    Progress Note Due on Visit 10    OT Start Time 1704    OT Stop Time 1745    OT Time Calculation (min) 41 min    Activity Tolerance Patient tolerated treatment well    Behavior During Therapy [facility] for tasks assessed/performed           Past Medical History:  Diagnosis Date  . Benign prostatic hyperplasia without urinary obstruction 06/17/2014  . BPPV (benign paroxysmal positional vertigo) 07/01/2011  . Chronic hyperkalemia 05/20/2012   Overview:  Hx of for years  . Concussion syndrome 05/15/2011   This seems like a moderately severe concussion with at least 1 minute loss of consciousness in 15-20 minutes of significant confusion including being unable to state his name   . DEGENERATIVE JOINT DISEASE, HANDS 10/16/2009   Qualifier: Diagnosis of  By: [name], [name]    . Diverticulosis 06/20/2017  . Hemorrhoids, external 06/20/2017  . Lumbar spondylosis 06/20/2017   Fairly significant L4-L5 facet arthropathy with effusion.  Can consider facet injections and potentially RFA down the road.  Marland Kitchen NEUROPATHY 03/01/2008   Qualifier: Diagnosis of  By: [name], [name]    . Paroxysmal atrial fibrillation (HCC)   . Pulmonary insufficiency 03/09/2018   moderate PI noted on echo 03/09/18  . Pulmonary vein stenosis    left inferior pulmonary vein stenosis  . Right inguinal  hernia 06/17/2014  . Sinus bradycardia   . Strain of gluteus medius 01/29/2017  . Stroke ([place]) 05/2020  . Vertigo     Past Surgical History:  Procedure Laterality Date  . ANKLE ARTHROSCOPY    . ATRIAL FIBRILLATION ABLATION N/A 05/25/2018   Procedure: ATRIAL FIBRILLATION ABLATION;  Surgeon: Thompson Grayer, MD;  Location: [facility];  Service: Cardiovascular;  Laterality: N/A;  . KNEE ARTHROSCOPY      There were no vitals filed for this visit.   Subjective Assessment - 08/02/20 1704    Subjective  "I've been doing the exercises and I think it's better but can't tell much of a difference."    Pertinent History CVA (Multiple infarcts) 06/22/20 with diplopia. PMH: BPPV, paroxysmal atrial fibrillation s/p ablation and pulmonary vein stenosis, hx of concussion (2012).    Patient Stated Goals Getting back to riding the bike and running and driving. Double Vision.    Currently in Pain? No/denies             Treatment: Saccades - left eye jumpy, right eye undershoots with horizontal saccades. Eyes even with vertical saccades   Tabletop scanning with no reports of double vision and no occlusion. Wearing everyday glasses.   Environmental Scanning with 64% accuracy. Most errors were on max distracting backgrounds and on the right side.   Reviewed Diplopia HEP.   Following small peg board design to  the right. Pt reports no double vision while looking at pattern and no delayed focus.     OT Short Term Goals - 08/02/20 1713      OT SHORT TERM GOAL #1   Title Pt will be independent with diplopia HEP 08/13/20    Time 4    Period Weeks    Status Achieved    Target Date 08/13/20      OT SHORT TERM GOAL #2   Title Pt will read large print page of text with partial occlusion PRN with no reports of diplopia    Time 4    Period Weeks    Status Achieved   no reports of diplopia and no occlusion 10/7     OT SHORT TERM GOAL #3   Title Pt will complete table top scanning with 75 %  accuracy with partial occlusion PRN with no reports of diplopia    Time 4    Period Weeks    Status Achieved   99% accuracy     OT SHORT TERM GOAL #4   Title Pt will perform environmental scanning with 75% accuracy in a minimlly distracting environment with minimal reports of diplopia with partial occlusion PRN    Time 4    Period Weeks    Status On-going   64% accuracy     OT SHORT TERM GOAL #5   Title Pt will demonstrate reading of a bill (electric, cable, etc) with minimal errors and no reports of diplopia with partial occlusion PRN to increase independence with financial management.    Time 4    Period Weeks    Status Achieved   no reports of diplopia except really small print 10/7     OT SHORT TERM GOAL #6   Title Pt will prepare a simple warm prep meal with modified independence while locating all items and with good safety in order to increase independence with home management.    Time 4    Period Weeks    Status New             OT Long Term Goals - 08/02/20 1718      OT LONG TERM GOAL #1   Title Pt will be independent with updated diplopia HEP 08/27/2020    Time 6    Period Weeks    Status New      OT LONG TERM GOAL #2   Title Pt will read 12pt or smaller print page of text with partial occlusion PRN with no reports of diplopia    Time 6    Period Weeks    Status New      OT LONG TERM GOAL #3   Title Pt will complete table top scanning with 90 % accuracy with partial occlusion PRN with no reports of diplopia    Time 6    Period Weeks    Status Achieved   99%     OT LONG TERM GOAL #4   Title Pt will perform environmental scanning with 90% accuracy in a minimlly distracting environment with minimal reports of diplopia with partial occlusion PRN    Time 6    Period Weeks    Status New      OT LONG TERM GOAL #5   Title Pt will demonstrate reading of a bill (electric, cable, etc) with no errors and no reports of diplopia with partial occlusion PRN to increase  independence with financial management.    Time 6    Period  Weeks    Status New      OT LONG TERM GOAL #6   Title Patient will verbalize understanding of compensatory strategies for visual deficits.    Time 6    Period Weeks    Status New                 Plan - 08/02/20 1704    Clinical Impression Statement Pt demonstrated more difficulty with environmental scanning and attendtion to right. Pt has met goals pertaining to reading large print and bills secondary to improved and increased area for single vision.    OT Occupational Profile and History Detailed Assessment- Review of Records and additional review of physical, cognitive, psychosocial history related to current functional performance    Occupational performance deficits (Please refer to evaluation for details): Leisure;IADL's;ADL's    Body Structure / Function / Physical Skills ADL;Coordination;UE functional use;Vestibular;Vision;Strength;IADL;Decreased knowledge of use of DME;Decreased knowledge of precautions;ROM;FMC;Dexterity;Balance;Endurance;GMC    Cognitive Skills Attention;Safety Awareness    Rehab Potential Good    Clinical Decision Making Limited treatment options, no task modification necessary    Comorbidities Affecting Occupational Performance: May have comorbidities impacting occupational performance    Modification or Assistance to Complete Evaluation  No modification of tasks or assist necessary to complete eval    OT Frequency 2x / week    OT Duration 8 weeks    OT Treatment/Interventions Self-care/ADL training;Visual/perceptual remediation/compensation;Therapeutic activities;Balance training;Manual Therapy;DME and/or AE instruction    Plan diplopia in functional tasks, cooking, environmental scanning    Consulted and Agree with Plan of Care Patient           Patient will benefit from skilled therapeutic intervention in order to improve the following deficits and impairments:   Body Structure /  Function / Physical Skills: ADL, Coordination, UE functional use, Vestibular, Vision, Strength, IADL, Decreased knowledge of use of DME, Decreased knowledge of precautions, ROM, FMC, Dexterity, Balance, Endurance, GMC Cognitive Skills: Attention, Safety Awareness     Visit Diagnosis: Muscle weakness (generalized)  Unsteadiness on feet  Other abnormalities of gait and mobility  Other lack of coordination  Visuospatial deficit    Problem List Patient Active Problem List   Diagnosis Date Noted  . Stroke ([place]) 06/22/2020  . Arthralgia 07/14/2019  . Pulmonary vein stenosis s/p stenting 2020 01/02/2019  . Chronic neck pain 08/23/2018  . Tinnitus 08/23/2018  . Lipoma 08/23/2018  . Paroxysmal atrial fibrillation ([place]) 05/25/2018  . Diverticulosis 06/20/2017  . Hemorrhoids, external 06/20/2017  . Benign prostatic hyperplasia without urinary obstruction 06/17/2014  . BPPV (benign paroxysmal positional vertigo) 07/01/2011  . DEGENERATIVE JOINT DISEASE, HANDS 10/16/2009  . NEUROPATHY 03/01/2008    Zachery Conch MOT, OTR/L  08/02/2020, 5:39 PM  [facility] [address] [place], [place], [address] Phone: [phone]   Fax:  [phone]  Name: ZANE PELLECCHIA MRN: [id number] Date of Birth: 1951-04-21

## 2020-08-06 ENCOUNTER — Ambulatory Visit: Payer: PPO

## 2020-08-06 ENCOUNTER — Other Ambulatory Visit: Payer: Self-pay

## 2020-08-06 DIAGNOSIS — M6281 Muscle weakness (generalized): Secondary | ICD-10-CM | POA: Diagnosis not present

## 2020-08-06 DIAGNOSIS — R2689 Other abnormalities of gait and mobility: Secondary | ICD-10-CM

## 2020-08-06 DIAGNOSIS — I69351 Hemiplegia and hemiparesis following cerebral infarction affecting right dominant side: Secondary | ICD-10-CM

## 2020-08-06 DIAGNOSIS — R2681 Unsteadiness on feet: Secondary | ICD-10-CM

## 2020-08-06 NOTE — Therapy (Signed)
[facility] [address] [place], [place], [address] Phone: [phone]   Fax:  [phone]  Physical Therapy Treatment  Patient Details  Name: William Underwood MRN: [id number] Date of Birth: 10/16/1951 Referring Provider (PT): Garvin Fila, MD   Encounter Date: 08/06/2020   PT End of Session - 08/06/20 1455    Visit Number 11    Number of Visits 17    Date for PT Re-Evaluation 09/25/20   written for 60 day POC   Authorization Type HTA 2021 - $15 CO PAY    PT Start Time 1450    PT Stop Time 1530    PT Time Calculation (min) 40 min    Equipment Utilized During Treatment Gait belt    Activity Tolerance Patient tolerated treatment well    Behavior During Therapy WFL for tasks assessed/performed           Past Medical History:  Diagnosis Date  . Benign prostatic hyperplasia without urinary obstruction 06/17/2014  . BPPV (benign paroxysmal positional vertigo) 07/01/2011  . Chronic hyperkalemia 05/20/2012   Overview:  Hx of for years  . Concussion syndrome 05/15/2011   This seems like a moderately severe concussion with at least 1 minute loss of consciousness in 15-20 minutes of significant confusion including being unable to state his name   . DEGENERATIVE JOINT DISEASE, HANDS 10/16/2009   Qualifier: Diagnosis of  By: [name], [name]    . Diverticulosis 06/20/2017  . Hemorrhoids, external 06/20/2017  . Lumbar spondylosis 06/20/2017   Fairly significant L4-L5 facet arthropathy with effusion.  Can consider facet injections and potentially RFA down the road.  Marland Kitchen NEUROPATHY 03/01/2008   Qualifier: Diagnosis of  By: [name], [name]    . Paroxysmal atrial fibrillation (HCC)   . Pulmonary insufficiency 03/09/2018   moderate PI noted on echo 03/09/18  . Pulmonary vein stenosis    left inferior pulmonary vein stenosis  . Right inguinal hernia 06/17/2014  . Sinus bradycardia   . Strain of gluteus medius 01/29/2017  . Stroke  ([place]) 05/2020  . Vertigo     Past Surgical History:  Procedure Laterality Date  . ANKLE ARTHROSCOPY    . ATRIAL FIBRILLATION ABLATION N/A 05/25/2018   Procedure: ATRIAL FIBRILLATION ABLATION;  Surgeon: Thompson Grayer, MD;  Location: [facility];  Service: Cardiovascular;  Laterality: N/A;  . KNEE ARTHROSCOPY      There were no vitals filed for this visit.   Subjective Assessment - 08/06/20 1453    Subjective Pt reports his walking better. He is not feeling dizzy. Trouble is with double vision and some light headedness with head turns.    Patient is accompained by: --   [name]   Pertinent History BPPV, paroxysmal atrial fibrillation s/p ablation and pulmonary vein stenosis, hx of concussion (2012)    Diagnostic tests Numerous areas of acute infarct. Multiple small infarcts are presentin the posterior circulation including the pons and midbrain in themidline as well as small acute infarcts in the left frontal andparietal cortex. Small acute infarct left thalamus. Findingscompatible with multiple emboli. No hemorrhage.    Patient Stated Goals wants to be able to get back to road cycling.            Walking fwd and bwd: 3 x 20 feet with EO, no significant deviations, 6 x 20 feet with eyes closed, cues to keep in line and keeping BOS constant to decrease deviation from straight line  SLS with  cone on ipsilateral side and pt touches top of the cone with contralateral foot: 10x R and L on floor, cues to turn head to make sure cone is in visual field  Partial tandem stance with ball toss: 20x with small play ball, 20x with larger play ball to challenge visual field, ball was thrown in multiple directions and cues to turn head to track ball and keeping it in visual filed  Walking fwd and bwd on balance beam: 4x , cues to slow down and making sure ipsilateral leg has balance before moving contrlaateral leg fwd or bwd.                                   PT  Short Term Goals - 07/26/20 1114      PT SHORT TERM GOAL #1   Title Pt will be independent with initial HEP in order to build upon functional gains made in therapy. ALL STGS DUE 07/26/20    Time 4    Period Weeks    Status Achieved    Target Date 07/26/20      PT SHORT TERM GOAL #2   Title Pt will improve FGA score to at least a 21/30 in order to demo decr fall risk.    Baseline FGA 18/30 on 07/05/20, 27/30    Time 4    Period Weeks    Status Achieved      PT SHORT TERM GOAL #3   Title Pt will decr TUG time to 13.5 seconds or less with no AD in order to demo decr fall risk.    Baseline 14.97 no AD, 8.22 SECONDS ON 07/26/20    Time 4    Period Weeks    Status Achieved             PT Long Term Goals - 07/26/20 1115      PT LONG TERM GOAL #1   Title Pt will be independent with FINAL HEP in order to build upon functional gains made in therapy. ALL LTGS DUE 08/23/20    Time 8    Period Weeks    Status New      PT LONG TERM GOAL #2   Title Pt will improve FGA score to at least 29/30 in order to demo decr fall risk.    Baseline 27/30    Time 8    Period Weeks    Status Revised      PT LONG TERM GOAL #3   Title Pt will improve gait speed with no AD vs. LRAD to at least 3.0 ft/sec in order to demo improved gait efficiency.    Time 8    Period Weeks    Status New      PT LONG TERM GOAL #4   Title Pt will be able to walk 1,000' outdoors over unlevel surfaces with supervision with no LOB in order to demo improved community mobility with no AD vs. LRAD.    Time 8    Period Weeks    Status New                 Plan - 08/06/20 1454    Clinical Impression Statement Today's session was focused on gradually progressing balance on SLS, non compliant surfaces, decreased vision. Pt demonstrated increased anxiety when balance with narrow BOS or SLS. Pt was educated that his balance is pretty good and his anxiety is causing  his balance reactions to get worst. AFter understanding  this concept and educating patient to relax, patient did much better when standing with partial balance with ball thorw, walking on balance beam etc. Patient does need cueing to slow down with more dyanmically challanged balance exercises.    Personal Factors and Comorbidities Comorbidity 3+;Fitness;Past/Current Experience;Behavior Pattern   pt extremely active prior to stroke   Comorbidities BPPV, paroxysmal atrial fibrillation s/p ablation and pulmonary vein stenosis, hx of concussion (2012)    Examination-Activity Limitations Stand;Squat;Transfers;Locomotion Level;Stairs    Examination-Participation Restrictions Community Activity;Driving   running, cycling   Stability/Clinical Decision Making Evolving/Moderate complexity    Rehab Potential Good    PT Frequency 2x / week    PT Duration 8 weeks    PT Treatment/Interventions ADLs/Self Care Home Management;DME Instruction;Gait training;Stair training;Therapeutic activities;Functional mobility training;Therapeutic exercise;Neuromuscular re-education;Balance training;Patient/family education;Vestibular;Visual/perceptual remediation/compensation    PT Next Visit Plan visual tracking incorporated into balance. progress VOR x1 (try on a compliant surface) high level balance (SLS, tandem stance, vestibular input for balance), dynamic gait training, hip ABD strengthening    Consulted and Agree with Plan of Care Patient   pt's gf          Patient will benefit from skilled therapeutic intervention in order to improve the following deficits and impairments:  Abnormal gait, Decreased balance, Decreased coordination, Decreased endurance, Decreased safety awareness, Decreased knowledge of use of DME, Decreased strength, Dizziness, Difficulty walking, Impaired vision/preception, Impaired sensation  Visit Diagnosis: Hemiplegia and hemiparesis following cerebral infarction affecting right dominant side (HCC)  Muscle weakness (generalized)  Other  abnormalities of gait and mobility  Unsteadiness on feet     Problem List Patient Active Problem List   Diagnosis Date Noted  . Stroke ([place]) 06/22/2020  . Arthralgia 07/14/2019  . Pulmonary vein stenosis s/p stenting 2020 01/02/2019  . Chronic neck pain 08/23/2018  . Tinnitus 08/23/2018  . Lipoma 08/23/2018  . Paroxysmal atrial fibrillation ([facility] [facility]) 05/25/2018  . Diverticulosis 06/20/2017  . Hemorrhoids, external 06/20/2017  . Benign prostatic hyperplasia without urinary obstruction 06/17/2014  . BPPV (benign paroxysmal positional vertigo) 07/01/2011  . DEGENERATIVE JOINT DISEASE, HANDS 10/16/2009  . NEUROPATHY 03/01/2008    Kerrie Pleasure, PT 08/06/2020, 3:31 PM  [facility] [address] [place], [place], [address] Phone: [phone]   Fax:  [phone]  Name: CHARLIE SEDA MRN: [id number] Date of Birth: [birth year]

## 2020-08-08 ENCOUNTER — Encounter: Payer: Self-pay | Admitting: Occupational Therapy

## 2020-08-08 ENCOUNTER — Ambulatory Visit: Payer: PPO | Admitting: Internal Medicine

## 2020-08-08 ENCOUNTER — Ambulatory Visit: Payer: PPO | Admitting: Occupational Therapy

## 2020-08-08 ENCOUNTER — Other Ambulatory Visit: Payer: Self-pay

## 2020-08-08 ENCOUNTER — Ambulatory Visit: Payer: PPO

## 2020-08-08 DIAGNOSIS — M6281 Muscle weakness (generalized): Secondary | ICD-10-CM | POA: Diagnosis not present

## 2020-08-08 DIAGNOSIS — I69351 Hemiplegia and hemiparesis following cerebral infarction affecting right dominant side: Secondary | ICD-10-CM

## 2020-08-08 DIAGNOSIS — R41842 Visuospatial deficit: Secondary | ICD-10-CM

## 2020-08-08 DIAGNOSIS — R2681 Unsteadiness on feet: Secondary | ICD-10-CM

## 2020-08-08 DIAGNOSIS — R2689 Other abnormalities of gait and mobility: Secondary | ICD-10-CM

## 2020-08-08 DIAGNOSIS — R42 Dizziness and giddiness: Secondary | ICD-10-CM

## 2020-08-08 NOTE — Therapy (Signed)
[facility] [address] [place], [place], [address] Phone: [phone]   Fax:  [phone]  Occupational Therapy Treatment  Patient Details  Name: William Underwood MRN: [id number] Date of Birth: 10/06/1951 Referring Provider (OT): Dr. Leonie Man   Encounter Date: 08/08/2020   OT End of Session - 08/08/20 1528    Visit Number 4    Number of Visits 17    Date for OT Re-Evaluation 09/11/20    Authorization Type [employer]    Authorization Time Period VL: [place] no auth Week 2/8 (10/7)    Authorization - Visit Number 4    Authorization - Number of Visits 10    Progress Note Due on Visit 10    OT Start Time 1450    OT Stop Time 1530    OT Time Calculation (min) 40 min    Activity Tolerance Patient tolerated treatment well    Behavior During Therapy [facility] for tasks assessed/performed           Past Medical History:  Diagnosis Date  . Benign prostatic hyperplasia without urinary obstruction 06/17/2014  . BPPV (benign paroxysmal positional vertigo) 07/01/2011  . Chronic hyperkalemia 05/20/2012   Overview:  Hx of for years  . Concussion syndrome 05/15/2011   This seems like a moderately severe concussion with at least 1 minute loss of consciousness in 15-20 minutes of significant confusion including being unable to state his name   . DEGENERATIVE JOINT DISEASE, HANDS 10/16/2009   Qualifier: Diagnosis of  By: [name], [name]    . Diverticulosis 06/20/2017  . Hemorrhoids, external 06/20/2017  . Lumbar spondylosis 06/20/2017   Fairly significant L4-L5 facet arthropathy with effusion.  Can consider facet injections and potentially RFA down the road.  Marland Kitchen NEUROPATHY 03/01/2008   Qualifier: Diagnosis of  By: [name], [name]    . Paroxysmal atrial fibrillation (HCC)   . Pulmonary insufficiency 03/09/2018   moderate PI noted on echo 03/09/18  . Pulmonary vein stenosis    left inferior pulmonary vein stenosis  . Right inguinal  hernia 06/17/2014  . Sinus bradycardia   . Strain of gluteus medius 01/29/2017  . Stroke ([place]) 05/2020  . Vertigo     Past Surgical History:  Procedure Laterality Date  . ANKLE ARTHROSCOPY    . ATRIAL FIBRILLATION ABLATION N/A 05/25/2018   Procedure: ATRIAL FIBRILLATION ABLATION;  Surgeon: Thompson Grayer, MD;  Location: [facility];  Service: Cardiovascular;  Laterality: N/A;  . KNEE ARTHROSCOPY      There were no vitals filed for this visit.   Subjective Assessment - 08/08/20 1528    Subjective  Pt reports his vision is a lot better    Pertinent History CVA (Multiple infarcts) 06/22/20 with diplopia. PMH: BPPV, paroxysmal atrial fibrillation s/p ablation and pulmonary vein stenosis, hx of concussion (2012).    Patient Stated Goals Getting back to riding the bike and running and driving. Double Vision.    Currently in Pain? No/denies               Treatment: Environmental scanning 3/20 items missed first pass, pt was able to turn his head to to compensate for diplopia during environmental scanning. Pt completed 2, 12 piece puzzles in 7 mins. Pt read a page of small print without reports of diplopia. Reviewed diplopia HEP, pt returned demonstration, min v.c to avoid scanning to extreme periphery during activity. Therapist recommends pt does not drive or ride his bicycle at this time  for safety.                   OT Short Term Goals - 08/08/20 1455      OT SHORT TERM GOAL #1   Title Pt will be independent with diplopia HEP 08/13/20    Time 4    Period Weeks    Status Achieved    Target Date 08/13/20      OT SHORT TERM GOAL #2   Title Pt will read large print page of text with partial occlusion PRN with no reports of diplopia    Time 4    Period Weeks    Status Achieved   no reports of diplopia and no occlusion 10/7     OT SHORT TERM GOAL #3   Title Pt will complete table top scanning with 75 % accuracy with partial occlusion PRN with no reports of  diplopia    Time 4    Period Weeks    Status Achieved   99% accuracy     OT SHORT TERM GOAL #4   Title Pt will perform environmental scanning with 75% accuracy in a minimlly distracting environment with minimal reports of diplopia with partial occlusion PRN    Time 4    Period Weeks    Status On-going   64% accuracy     OT SHORT TERM GOAL #5   Title Pt will demonstrate reading of a bill (electric, cable, etc) with minimal errors and no reports of diplopia with partial occlusion PRN to increase independence with financial management.    Time 4    Period Weeks    Status Achieved   no reports of diplopia except really small print 10/7     OT SHORT TERM GOAL #6   Title Pt will prepare a simple warm prep meal with modified independence while locating all items and with good safety in order to increase independence with home management.    Time 4    Period Weeks    Status On-going             OT Long Term Goals - 08/08/20 1457      OT LONG TERM GOAL #1   Title Pt will be independent with updated diplopia HEP 08/27/2020    Time 6    Period Weeks    Status New      OT LONG TERM GOAL #2   Title Pt will read 12pt or smaller print page of text with partial occlusion PRN with no reports of diplopia, revised Pt will report ability to read small print text for 30 mins without reports of diplopia    Time 6    Period Weeks    Status Revised   original goal met 08/08/20     OT LONG TERM GOAL #3   Title Pt will complete table top scanning with 90 % accuracy with partial occlusion PRN with no reports of diplopia    Time 6    Period Weeks    Status Achieved   99%     OT LONG TERM GOAL #4   Title Pt will perform environmental scanning with 90% accuracy in a minimlly distracting environment with minimal reports of diplopia with partial occlusion PRN    Time 6    Period Weeks    Status On-going      OT LONG TERM GOAL #5   Title Pt will demonstrate reading of a bill (electric, cable,  etc) with no errors and  no reports of diplopia with partial occlusion PRN to increase independence with financial management.    Time 6    Period Weeks    Status On-going      OT LONG TERM GOAL #6   Title Patient will verbalize understanding of compensatory strategies for visual deficits.    Time 6    Period Weeks    Status New                 Plan - 08/08/20 1521    Clinical Impression Statement Pt is progressing towards goals. He demonstrates improved environmental scanning and reading ability with less diplopia.    OT Occupational Profile and History Detailed Assessment- Review of Records and additional review of physical, cognitive, psychosocial history related to current functional performance    Occupational performance deficits (Please refer to evaluation for details): Leisure;IADL's;ADL's    Body Structure / Function / Physical Skills ADL;Coordination;UE functional use;Vestibular;Vision;Strength;IADL;Decreased knowledge of use of DME;Decreased knowledge of precautions;ROM;FMC;Dexterity;Balance;Endurance;GMC    Cognitive Skills Attention;Safety Awareness    Rehab Potential Good    Clinical Decision Making Limited treatment options, no task modification necessary    Comorbidities Affecting Occupational Performance: May have comorbidities impacting occupational performance    Modification or Assistance to Complete Evaluation  No modification of tasks or assist necessary to complete eval    OT Frequency 2x / week    OT Duration 8 weeks    OT Treatment/Interventions Self-care/ADL training;Visual/perceptual remediation/compensation;Therapeutic activities;Balance training;Manual Therapy;DME and/or AE instruction    Plan simple cooking task with pt locating items, consider dropping down to 1x week for OT in the future if vision continues to improve.    Consulted and Agree with Plan of Care Patient           Patient will benefit from skilled therapeutic intervention in order to  improve the following deficits and impairments:   Body Structure / Function / Physical Skills: ADL, Coordination, UE functional use, Vestibular, Vision, Strength, IADL, Decreased knowledge of use of DME, Decreased knowledge of precautions, ROM, FMC, Dexterity, Balance, Endurance, GMC Cognitive Skills: Attention, Safety Awareness     Visit Diagnosis: Hemiplegia and hemiparesis following cerebral infarction affecting right dominant side (HCC)  Muscle weakness (generalized)  Visuospatial deficit    Problem List Patient Active Problem List   Diagnosis Date Noted  . Stroke ([place]) 06/22/2020  . Arthralgia 07/14/2019  . Pulmonary vein stenosis s/p stenting 2020 01/02/2019  . Chronic neck pain 08/23/2018  . Tinnitus 08/23/2018  . Lipoma 08/23/2018  . Paroxysmal atrial fibrillation ([place]) 05/25/2018  . Diverticulosis 06/20/2017  . Hemorrhoids, external 06/20/2017  . Benign prostatic hyperplasia without urinary obstruction 06/17/2014  . BPPV (benign paroxysmal positional vertigo) 07/01/2011  . DEGENERATIVE JOINT DISEASE, HANDS 10/16/2009  . NEUROPATHY 03/01/2008    Kloi Brodman 08/08/2020, 3:30 PM  [facility] [address] [place], [place], [address] Phone: [phone]   Fax:  [phone]  Name: William Underwood MRN: [id number] Date of Birth: 12-24-1950

## 2020-08-08 NOTE — Therapy (Signed)
[facility] [address] [place], [place], [address] Phone: [phone]   Fax:  [phone]  Physical Therapy Treatment  Patient Details  Name: William Underwood MRN: [id number] Date of Birth: [birth year] Referring Provider (PT): Garvin Fila, MD   Encounter Date: 08/08/2020   PT End of Session - 08/08/20 1407    Visit Number 12    Number of Visits 17    Date for PT Re-Evaluation 09/25/20   written for 60 day POC   Authorization Type HTA 2021 - $15 CO PAY    PT Start Time 1402    PT Stop Time 1444    PT Time Calculation (min) 42 min    Equipment Utilized During Treatment Gait belt    Activity Tolerance Patient tolerated treatment well    Behavior During Therapy WFL for tasks assessed/performed           Past Medical History:  Diagnosis Date  . Benign prostatic hyperplasia without urinary obstruction 06/17/2014  . BPPV (benign paroxysmal positional vertigo) 07/01/2011  . Chronic hyperkalemia 05/20/2012   Overview:  Hx of for years  . Concussion syndrome 05/15/2011   This seems like a moderately severe concussion with at least 1 minute loss of consciousness in 15-20 minutes of significant confusion including being unable to state his name   . DEGENERATIVE JOINT DISEASE, HANDS 10/16/2009   Qualifier: Diagnosis of  By: [name], [name]    . Diverticulosis 06/20/2017  . Hemorrhoids, external 06/20/2017  . Lumbar spondylosis 06/20/2017   Fairly significant L4-L5 facet arthropathy with effusion.  Can consider facet injections and potentially RFA down the road.  [facility] Kitchen NEUROPATHY 03/01/2008   Qualifier: Diagnosis of  By: [name], [name]    . Paroxysmal atrial fibrillation (HCC)   . Pulmonary insufficiency 03/09/2018   moderate PI noted on echo 03/09/18  . Pulmonary vein stenosis    left inferior pulmonary vein stenosis  . Right inguinal hernia 06/17/2014  . Sinus bradycardia   . Strain of gluteus medius 01/29/2017  . Stroke  ([place]) 05/2020  . Vertigo     Past Surgical History:  Procedure Laterality Date  . ANKLE ARTHROSCOPY    . ATRIAL FIBRILLATION ABLATION N/A 05/25/2018   Procedure: ATRIAL FIBRILLATION ABLATION;  Surgeon: Thompson Grayer, MD;  Location: [facility];  Service: Cardiovascular;  Laterality: N/A;  . KNEE ARTHROSCOPY      There were no vitals filed for this visit.   Subjective Assessment - 08/08/20 1404    Subjective Patient reports no new changes/complaints since last visit. Patient reports that he still has not heard back from neuro opth at this time, plans to call them soon.    Patient is accompained by: --   [name]   Pertinent History BPPV, paroxysmal atrial fibrillation s/p ablation and pulmonary vein stenosis, hx of concussion (2012)    Diagnostic tests Numerous areas of acute infarct. Multiple small infarcts are presentin the posterior circulation including the pons and midbrain in themidline as well as small acute infarcts in the left frontal andparietal cortex. Small acute infarct left thalamus. Findingscompatible with multiple emboli. No hemorrhage.    Patient Stated Goals wants to be able to get back to road cycling.    Currently in Pain? No/denies                             [facility] Adult PT Treatment/Exercise - 08/08/20 0001  Ambulation/Gait   Ambulation/Gait Yes    Ambulation/Gait Assistance 5: Supervision    Ambulation/Gait Assistance Details throughout therapy session with activities    Assistive device None    Gait Pattern Step-through pattern    Ambulation Surface Level;Indoor      Neuro Re-ed    Neuro Re-ed Details  completed ambulation with self ball toss x 115 ft. progressed to completing dual task with ambulation and self ball toss with addition of cognitive task including: naming animals that start with letter of alphabet x 115 ft, counting backwards by 3's x 115 ft, counting backwards by 4's x 115 ft. Patient demo improved dual tasking  during session. supervision throughout, no imbalance noted.            Vestibular Treatment/Exercise - 08/08/20 0001      Vestibular Treatment/Exercise   Gaze Exercises X1 Viewing Horizontal;X1 Viewing Vertical      X1 Viewing Horizontal   Foot Position standing feet apart on airex    Reps 2    Comments 60 seconds, no symptoms noted      X1 Viewing Vertical   Foot Position standing feet apart on airex    Reps 2    Comments 60 seconds, no symptoms noted.               Balance Exercises - 08/08/20 0001      Balance Exercises: Standing   SLS with Vectors Foam/compliant surface;Intermittent upper extremity assist;Limitations    SLS with Vectors Limitations standing on airex, completed alternating toe taps to soccerball x 15 reps bilaterally without UE support. intermittent CGA required.     Tandem Gait Forward;Retro;Foam/compliant surface;4 reps;Limitations    Tandem Gait Limitations completd forward and retro tandem walking, down and back on blue balance beam x 4 reps. Increased UE support required for retro    Sidestepping Foam/compliant support;4 reps;Limitations    Sidestepping Limitations completed side stepping down and back on balance beam x 4 laps. verbal cues to slowe pace.     Other Standing Exercises at countertop completed stepping on stepping stones to further promote SLS, completed down and back x 4 reps. progressed stepping stones with increased space between x 3 laps, down and back. verbal cues for slowed pace.     Other Standing Exercises Comments completed SLS activity with soccer ball, including one LE placed on soccer ball completed forward/backward rolls and laterally rolls x 10 reps each. Progresed to completing on opposite LE, increased difficulty noted with stance on LLE. Progressed to completing with stance on foam x 10 reps each direction on BLE. increased balance challenge noted on foam surface requiring intermittent UE support.                PT  Short Term Goals - 07/26/20 1114      PT SHORT TERM GOAL #1   Title Pt will be independent with initial HEP in order to build upon functional gains made in therapy. ALL STGS DUE 07/26/20    Time 4    Period Weeks    Status Achieved    Target Date 07/26/20      PT SHORT TERM GOAL #2   Title Pt will improve FGA score to at least a 21/30 in order to demo decr fall risk.    Baseline FGA 18/30 on 07/05/20, 27/30    Time 4    Period Weeks    Status Achieved      PT SHORT TERM GOAL #3   Title  Pt will decr TUG time to 13.5 seconds or less with no AD in order to demo decr fall risk.    Baseline 14.97 no AD, 8.22 SECONDS ON 07/26/20    Time 4    Period Weeks    Status Achieved             PT Long Term Goals - 07/26/20 1115      PT LONG TERM GOAL #1   Title Pt will be independent with FINAL HEP in order to build upon functional gains made in therapy. ALL LTGS DUE 08/23/20    Time 8    Period Weeks    Status New      PT LONG TERM GOAL #2   Title Pt will improve FGA score to at least 29/30 in order to demo decr fall risk.    Baseline 27/30    Time 8    Period Weeks    Status Revised      PT LONG TERM GOAL #3   Title Pt will improve gait speed with no AD vs. LRAD to at least 3.0 ft/sec in order to demo improved gait efficiency.    Time 8    Period Weeks    Status New      PT LONG TERM GOAL #4   Title Pt will be able to walk 1,000' outdoors over unlevel surfaces with supervision with no LOB in order to demo improved community mobility with no AD vs. LRAD.    Time 8    Period Weeks    Status New                 Plan - 08/08/20 1512    Clinical Impression Statement Today's skilled PT session included continued balance exericses promoting SLS, with increased difficulty noted with stance on LLE. Patient is demonstrating improved balance with activites througohut session and has made significant progress with PT services. Patient continues to require verbal cues for slowed  pace with balance exercises, demo improved completion with cues. Will continue to progress with PT services.    Personal Factors and Comorbidities Comorbidity 3+;Fitness;Past/Current Experience;Behavior Pattern   pt extremely active prior to stroke   Comorbidities BPPV, paroxysmal atrial fibrillation s/p ablation and pulmonary vein stenosis, hx of concussion (2012)    Examination-Activity Limitations Stand;Squat;Transfers;Locomotion Level;Stairs    Examination-Participation Restrictions Community Activity;Driving   running, cycling   Stability/Clinical Decision Making Evolving/Moderate complexity    Rehab Potential Good    PT Frequency 2x / week    PT Duration 8 weeks    PT Treatment/Interventions ADLs/Self Care Home Management;DME Instruction;Gait training;Stair training;Therapeutic activities;Functional mobility training;Therapeutic exercise;Neuromuscular re-education;Balance training;Patient/family education;Vestibular;Visual/perceptual remediation/compensation    PT Next Visit Plan visual tracking incorporated into balance. progress VOR x1 (try on a compliant surface) high level balance (SLS, tandem stance, vestibular input for balance), dynamic gait training, hip ABD strengthening    Consulted and Agree with Plan of Care Patient   pt's gf          Patient will benefit from skilled therapeutic intervention in order to improve the following deficits and impairments:  Abnormal gait, Decreased balance, Decreased coordination, Decreased endurance, Decreased safety awareness, Decreased knowledge of use of DME, Decreased strength, Dizziness, Difficulty walking, Impaired vision/preception, Impaired sensation  Visit Diagnosis: Hemiplegia and hemiparesis following cerebral infarction affecting right dominant side (HCC)  Muscle weakness (generalized)  Other abnormalities of gait and mobility  Unsteadiness on feet  Dizziness and giddiness     Problem List Patient  Active Problem List    Diagnosis Date Noted  . Stroke ([place]) 06/22/2020  . Arthralgia 07/14/2019  . Pulmonary vein stenosis s/p stenting 2020 01/02/2019  . Chronic neck pain 08/23/2018  . Tinnitus 08/23/2018  . Lipoma 08/23/2018  . Paroxysmal atrial fibrillation ([place]) 05/25/2018  . Diverticulosis 06/20/2017  . Hemorrhoids, external 06/20/2017  . Benign prostatic hyperplasia without urinary obstruction 06/17/2014  . BPPV (benign paroxysmal positional vertigo) 07/01/2011  . DEGENERATIVE JOINT DISEASE, HANDS 10/16/2009  . NEUROPATHY 03/01/2008    Jones Bales, PT, DPT 08/08/2020, 3:14 PM  [facility] [address] [place], [place], [address] Phone: [phone]   Fax:  [phone]  Name: William Underwood MRN: [id number] Date of Birth: 14-Jan-1951

## 2020-08-13 ENCOUNTER — Ambulatory Visit: Payer: PPO

## 2020-08-13 ENCOUNTER — Ambulatory Visit: Payer: PPO | Admitting: Internal Medicine

## 2020-08-15 ENCOUNTER — Ambulatory Visit: Payer: PPO | Admitting: Physical Therapy

## 2020-08-16 ENCOUNTER — Ambulatory Visit: Payer: PPO | Admitting: Occupational Therapy

## 2020-08-16 ENCOUNTER — Ambulatory Visit: Payer: PPO | Admitting: Physical Therapy

## 2020-08-16 ENCOUNTER — Encounter: Payer: Self-pay | Admitting: Physical Therapy

## 2020-08-16 ENCOUNTER — Other Ambulatory Visit: Payer: Self-pay

## 2020-08-16 DIAGNOSIS — R2681 Unsteadiness on feet: Secondary | ICD-10-CM

## 2020-08-16 DIAGNOSIS — R2689 Other abnormalities of gait and mobility: Secondary | ICD-10-CM

## 2020-08-16 DIAGNOSIS — I69351 Hemiplegia and hemiparesis following cerebral infarction affecting right dominant side: Secondary | ICD-10-CM

## 2020-08-16 DIAGNOSIS — M6281 Muscle weakness (generalized): Secondary | ICD-10-CM | POA: Diagnosis not present

## 2020-08-16 NOTE — Therapy (Signed)
[facility] [address] [place], [place], [address] Phone: [phone]   Fax:  [phone]  Physical Therapy Treatment  Patient Details  Name: William Underwood MRN: [id number] Date of Birth: 1951-05-21 Referring Provider (PT): Garvin Fila, MD   Encounter Date: 08/16/2020   PT End of Session - 08/16/20 1619    Visit Number 13    Number of Visits 17    Date for PT Re-Evaluation 09/25/20   written for 60 day POC   Authorization Type HTA 2021 - $15 CO PAY    PT Start Time 1534    PT Stop Time 1613    PT Time Calculation (min) 39 min    Equipment Utilized During Treatment Gait belt    Activity Tolerance Patient tolerated treatment well    Behavior During Therapy WFL for tasks assessed/performed           Past Medical History:  Diagnosis Date  . Benign prostatic hyperplasia without urinary obstruction 06/17/2014  . BPPV (benign paroxysmal positional vertigo) 07/01/2011  . Chronic hyperkalemia 05/20/2012   Overview:  Hx of for years  . Concussion syndrome 05/15/2011   This seems like a moderately severe concussion with at least 1 minute loss of consciousness in 15-20 minutes of significant confusion including being unable to state his name   . DEGENERATIVE JOINT DISEASE, HANDS 10/16/2009   Qualifier: Diagnosis of  By: [name], [name]    . Diverticulosis 06/20/2017  . Hemorrhoids, external 06/20/2017  . Lumbar spondylosis 06/20/2017   Fairly significant L4-L5 facet arthropathy with effusion.  Can consider facet injections and potentially RFA down the road.  Marland Kitchen NEUROPATHY 03/01/2008   Qualifier: Diagnosis of  By: [name], [name]    . Paroxysmal atrial fibrillation (HCC)   . Pulmonary insufficiency 03/09/2018   moderate PI noted on echo 03/09/18  . Pulmonary vein stenosis    left inferior pulmonary vein stenosis  . Right inguinal hernia 06/17/2014  . Sinus bradycardia   . Strain of gluteus medius 01/29/2017  . Stroke  ([place]) 05/2020  . Vertigo     Past Surgical History:  Procedure Laterality Date  . ANKLE ARTHROSCOPY    . ATRIAL FIBRILLATION ABLATION N/A 05/25/2018   Procedure: ATRIAL FIBRILLATION ABLATION;  Surgeon: Thompson Grayer, MD;  Location: [facility];  Service: Cardiovascular;  Laterality: N/A;  . KNEE ARTHROSCOPY      There were no vitals filed for this visit.   Subjective Assessment - 08/16/20 1535    Subjective Feels like he is getting stronger and his balance is getting better. Still waiting to hear back from the neuro opth.    Patient is accompained by: --   [name]   Pertinent History BPPV, paroxysmal atrial fibrillation s/p ablation and pulmonary vein stenosis, hx of concussion (2012)    Diagnostic tests Numerous areas of acute infarct. Multiple small infarcts are presentin the posterior circulation including the pons and midbrain in themidline as well as small acute infarcts in the left frontal andparietal cortex. Small acute infarct left thalamus. Findingscompatible with multiple emboli. No hemorrhage.    Patient Stated Goals wants to be able to get back to road cycling.    Currently in Pain? No/denies                             [facility] Adult PT Treatment/Exercise - 08/16/20 0001      Ambulation/Gait  Ambulation/Gait Yes    Ambulation/Gait Assistance 4: Min guard    Ambulation/Gait Assistance Details dynamic gait over grass surfaces with head motions (nods and turns) with intermittent min A for balance, then performing quick start/stops with no LOB and 2 x 30' with slow marching. Then performing with pt holding ball and performing clockwise and counterclockwise circles while tracking ball with eyes/head with intermittent min A for balance    Ambulation Distance (Feet) 800 Feet    Assistive device None    Gait Pattern Step-through pattern    Ambulation Surface Unlevel;Outdoor;Grass    Gait Comments jogging down hallway - 4 x 50' with no LOB, pt reporting he  has had knee pain for the past 8 weeks and feels like a meniscus issue that he has had in the past, PT encouraging pt to make appt with orthopedist in order to get knee checked out before progressing any jogging. Pt in agreement.               Balance Exercises - 08/16/20 1557      Balance Exercises: Standing   SLS Eyes open;Foam/compliant surface;Modified    SLS Time in // bars on rockerboard in A/P direction: alternating stepping onto board and then forward tapping to cone x10 reps B, then performing forward tap and cross body tap x10 reps B - beginning with intermittent UE support and progressing to none   Other Standing Exercises on blue side of BOSU: x10 reps mini squats - intermittent UE support and cues for technique, standing on blue side for SLS and lifting contralateral into march with fingertip support on bars x10 reps B, standing with one leg of blue standing hip ABD x10 reps B with fingertip support - cues for slight bend in the knee for stance leg   Other Standing Exercises Comments braided side stepping on level ground in // bars: down and back 4 reps with no UE support,then performing on blue foam beam down and back 2 reps with intermittent UE support - cues for slowed and controlled               PT Short Term Goals - 07/26/20 1114      PT SHORT TERM GOAL #1   Title Pt will be independent with initial HEP in order to build upon functional gains made in therapy. ALL STGS DUE 07/26/20    Time 4    Period Weeks    Status Achieved    Target Date 07/26/20      PT SHORT TERM GOAL #2   Title Pt will improve FGA score to at least a 21/30 in order to demo decr fall risk.    Baseline FGA 18/30 on 07/05/20, 27/30    Time 4    Period Weeks    Status Achieved      PT SHORT TERM GOAL #3   Title Pt will decr TUG time to 13.5 seconds or less with no AD in order to demo decr fall risk.    Baseline 14.97 no AD, 8.22 SECONDS ON 07/26/20    Time 4    Period Weeks    Status  Achieved             PT Long Term Goals - 07/26/20 1115      PT LONG TERM GOAL #1   Title Pt will be independent with FINAL HEP in order to build upon functional gains made in therapy. ALL LTGS DUE 08/23/20    Time 8  Period Weeks    Status New      PT LONG TERM GOAL #2   Title Pt will improve FGA score to at least 29/30 in order to demo decr fall risk.    Baseline 27/30    Time 8    Period Weeks    Status Revised      PT LONG TERM GOAL #3   Title Pt will improve gait speed with no AD vs. LRAD to at least 3.0 ft/sec in order to demo improved gait efficiency.    Time 8    Period Weeks    Status New      PT LONG TERM GOAL #4   Title Pt will be able to walk 1,000' outdoors over unlevel surfaces with supervision with no LOB in order to demo improved community mobility with no AD vs. LRAD.    Time 8    Period Weeks    Status New                 Plan - 08/16/20 1621    Clinical Impression Statement Practiced gait outdoors on grass surfaces with dynamic tasks such as visual scanning and head motions- pt needing min guard with intermittent min A for balance. Remainder of session focused on balance on compliant surfaces with focus on SLS. Pt improving with incr reps and needing decr UE support. Will  continue to progress towards LTGs.    Personal Factors and Comorbidities Comorbidity 3+;Fitness;Past/Current Experience;Behavior Pattern   pt extremely active prior to stroke   Comorbidities BPPV, paroxysmal atrial fibrillation s/p ablation and pulmonary vein stenosis, hx of concussion (2012)    Examination-Activity Limitations Stand;Squat;Transfers;Locomotion Level;Stairs    Examination-Participation Restrictions Community Activity;Driving   running, cycling   Stability/Clinical Decision Making Evolving/Moderate complexity    Rehab Potential Good    PT Frequency 2x / week    PT Duration 8 weeks    PT Treatment/Interventions ADLs/Self Care Home Management;DME  Instruction;Gait training;Stair training;Therapeutic activities;Functional mobility training;Therapeutic exercise;Neuromuscular re-education;Balance training;Patient/family education;Vestibular;Visual/perceptual remediation/compensation    PT Next Visit Plan upgrade pt's HEP. visual tracking incorporated into balance. high level balance (SLS, tandem stance, vestibular input for balance), dynamic gait training, hip ABD strengthening    Consulted and Agree with Plan of Care Patient   pt's gf          Patient will benefit from skilled therapeutic intervention in order to improve the following deficits and impairments:  Abnormal gait, Decreased balance, Decreased coordination, Decreased endurance, Decreased safety awareness, Decreased knowledge of use of DME, Decreased strength, Dizziness, Difficulty walking, Impaired vision/preception, Impaired sensation  Visit Diagnosis: Muscle weakness (generalized)  Hemiplegia and hemiparesis following cerebral infarction affecting right dominant side (HCC)  Other abnormalities of gait and mobility  Unsteadiness on feet     Problem List Patient Active Problem List   Diagnosis Date Noted  . Stroke ([place]) 06/22/2020  . Arthralgia 07/14/2019  . Pulmonary vein stenosis s/p stenting 2020 01/02/2019  . Chronic neck pain 08/23/2018  . Tinnitus 08/23/2018  . Lipoma 08/23/2018  . Paroxysmal atrial fibrillation ([place]) 05/25/2018  . Diverticulosis 06/20/2017  . Hemorrhoids, external 06/20/2017  . Benign prostatic hyperplasia without urinary obstruction 06/17/2014  . BPPV (benign paroxysmal positional vertigo) 07/01/2011  . DEGENERATIVE JOINT DISEASE, HANDS 10/16/2009  . NEUROPATHY 03/01/2008    Arliss Journey, PT, DPT  08/16/2020, 4:23 PM  [facility] [address] [place], [place], [address] Phone: [phone]   Fax:  [phone]  Name: William Underwood MRN: [id number] Date of Birth:  [birth year]

## 2020-08-20 ENCOUNTER — Ambulatory Visit: Payer: PPO | Admitting: Occupational Therapy

## 2020-08-20 ENCOUNTER — Encounter: Payer: Self-pay | Admitting: Physical Therapy

## 2020-08-20 ENCOUNTER — Ambulatory Visit: Payer: PPO | Admitting: Physical Therapy

## 2020-08-20 ENCOUNTER — Other Ambulatory Visit: Payer: Self-pay

## 2020-08-20 DIAGNOSIS — R2681 Unsteadiness on feet: Secondary | ICD-10-CM

## 2020-08-20 DIAGNOSIS — R41842 Visuospatial deficit: Secondary | ICD-10-CM

## 2020-08-20 DIAGNOSIS — M6281 Muscle weakness (generalized): Secondary | ICD-10-CM | POA: Diagnosis not present

## 2020-08-20 DIAGNOSIS — R2689 Other abnormalities of gait and mobility: Secondary | ICD-10-CM

## 2020-08-20 DIAGNOSIS — I69351 Hemiplegia and hemiparesis following cerebral infarction affecting right dominant side: Secondary | ICD-10-CM

## 2020-08-20 NOTE — Therapy (Signed)
[facility] [address] [place], [place], [address] Phone: [phone]   Fax:  [phone]  Occupational Therapy Treatment  Patient Details  Name: William Underwood MRN: [id number] Date of Birth: 02-Dec-1950 Referring Provider (OT): Dr. Leonie Man   Encounter Date: 08/20/2020   OT End of Session - 08/20/20 1259    Visit Number 5    Number of Visits 17    Date for OT Re-Evaluation 09/11/20    Authorization Type [employer]    Authorization Time Period VL: [place] no auth Week 2/8 (10/7)    Authorization - Visit Number 5    Authorization - Number of Visits 10    Progress Note Due on Visit 10    OT Start Time 1234    OT Stop Time 1315    OT Time Calculation (min) 41 min    Activity Tolerance Patient tolerated treatment well    Behavior During Therapy [facility] for tasks assessed/performed           Past Medical History:  Diagnosis Date  . Benign prostatic hyperplasia without urinary obstruction 06/17/2014  . BPPV (benign paroxysmal positional vertigo) 07/01/2011  . Chronic hyperkalemia 05/20/2012   Overview:  Hx of for years  . Concussion syndrome 05/15/2011   This seems like a moderately severe concussion with at least 1 minute loss of consciousness in 15-20 minutes of significant confusion including being unable to state his name   . DEGENERATIVE JOINT DISEASE, HANDS 10/16/2009   Qualifier: Diagnosis of  By: [name], [name]    . Diverticulosis 06/20/2017  . Hemorrhoids, external 06/20/2017  . Lumbar spondylosis 06/20/2017   Fairly significant L4-L5 facet arthropathy with effusion.  Can consider facet injections and potentially RFA down the road.  Marland Kitchen NEUROPATHY 03/01/2008   Qualifier: Diagnosis of  By: [name], [name]    . Paroxysmal atrial fibrillation (HCC)   . Pulmonary insufficiency 03/09/2018   moderate PI noted on echo 03/09/18  . Pulmonary vein stenosis    left inferior pulmonary vein stenosis  . Right inguinal  hernia 06/17/2014  . Sinus bradycardia   . Strain of gluteus medius 01/29/2017  . Stroke ([place]) 05/2020  . Vertigo     Past Surgical History:  Procedure Laterality Date  . ANKLE ARTHROSCOPY    . ATRIAL FIBRILLATION ABLATION N/A 05/25/2018   Procedure: ATRIAL FIBRILLATION ABLATION;  Surgeon: Thompson Grayer, MD;  Location: [facility];  Service: Cardiovascular;  Laterality: N/A;  . KNEE ARTHROSCOPY      There were no vitals filed for this visit.   Subjective Assessment - 08/20/20 1236    Subjective  I put 2 big bookshelves together this weekend, reading instructions and everything with lots of parts. I don't ever cook; I heat things in the microwave or do take out    Pertinent History CVA (Multiple infarcts) 06/22/20 with diplopia. PMH: BPPV, paroxysmal atrial fibrillation s/p ablation and pulmonary vein stenosis, hx of concussion (2012).    Patient Stated Goals Getting back to riding the bike and running and driving. Double Vision.    Currently in Pain? No/denies          Visual scanning to cross out and count amount of times double #'s appear on each line (66M print size) with 2 self corrected errors. Crossing out #'s in order 1-20 and then 21-35 (66M print size) with one cue required to slow down so he wouldn't miss #'s.  Word search (33M print size) with  extra time required to complete - pt scanned quickly and needed reminders to slow down.  Reviewed slowing down for diplopia HEP and focus on slowly following target side to side w/ Lt eye.  Discussed reducing frequency to 1x/wk - pt in agreement - and cancelled Wed OT appointments                         OT Short Term Goals - 08/08/20 1455      OT SHORT TERM GOAL #1   Title Pt will be independent with diplopia HEP 08/13/20    Time 4    Period Weeks    Status Achieved    Target Date 08/13/20      OT SHORT TERM GOAL #2   Title Pt will read large print page of text with partial occlusion PRN with no reports  of diplopia    Time 4    Period Weeks    Status Achieved   no reports of diplopia and no occlusion 10/7     OT SHORT TERM GOAL #3   Title Pt will complete table top scanning with 75 % accuracy with partial occlusion PRN with no reports of diplopia    Time 4    Period Weeks    Status Achieved   99% accuracy     OT SHORT TERM GOAL #4   Title Pt will perform environmental scanning with 75% accuracy in a minimlly distracting environment with minimal reports of diplopia with partial occlusion PRN    Time 4    Period Weeks    Status On-going   64% accuracy     OT SHORT TERM GOAL #5   Title Pt will demonstrate reading of a bill (electric, cable, etc) with minimal errors and no reports of diplopia with partial occlusion PRN to increase independence with financial management.    Time 4    Period Weeks    Status Achieved   no reports of diplopia except really small print 10/7     OT SHORT TERM GOAL #6   Title Pt will prepare a simple warm prep meal with modified independence while locating all items and with good safety in order to increase independence with home management.    Time 4    Period Weeks    Status On-going             OT Long Term Goals - 08/08/20 1457      OT LONG TERM GOAL #1   Title Pt will be independent with updated diplopia HEP 08/27/2020    Time 6    Period Weeks    Status New      OT LONG TERM GOAL #2   Title Pt will read 12pt or smaller print page of text with partial occlusion PRN with no reports of diplopia, revised Pt will report ability to read small print text for 30 mins without reports of diplopia    Time 6    Period Weeks    Status Revised   original goal met 08/08/20     OT LONG TERM GOAL #3   Title Pt will complete table top scanning with 90 % accuracy with partial occlusion PRN with no reports of diplopia    Time 6    Period Weeks    Status Achieved   99%     OT LONG TERM GOAL #4   Title Pt will perform environmental scanning with 90%  accuracy in a  minimlly distracting environment with minimal reports of diplopia with partial occlusion PRN    Time 6    Period Weeks    Status On-going      OT LONG TERM GOAL #5   Title Pt will demonstrate reading of a bill (electric, cable, etc) with no errors and no reports of diplopia with partial occlusion PRN to increase independence with financial management.    Time 6    Period Weeks    Status On-going      OT LONG TERM GOAL #6   Title Patient will verbalize understanding of compensatory strategies for visual deficits.    Time 6    Period Weeks    Status New                 Plan - 08/20/20 1300    Clinical Impression Statement Pt is progressing towards goals. He demonstrates improvement in diplopia with further central ranges of vision    OT Occupational Profile and History Detailed Assessment- Review of Records and additional review of physical, cognitive, psychosocial history related to current functional performance    Occupational performance deficits (Please refer to evaluation for details): Leisure;IADL's;ADL's    Body Structure / Function / Physical Skills ADL;Coordination;UE functional use;Vestibular;Vision;Strength;IADL;Decreased knowledge of use of DME;Decreased knowledge of precautions;ROM;FMC;Dexterity;Balance;Endurance;GMC    Cognitive Skills Attention;Safety Awareness    Rehab Potential Good    Clinical Decision Making Limited treatment options, no task modification necessary    Comorbidities Affecting Occupational Performance: May have comorbidities impacting occupational performance    Modification or Assistance to Complete Evaluation  No modification of tasks or assist necessary to complete eval    OT Frequency 2x / week    OT Duration 8 weeks    OT Treatment/Interventions Self-care/ADL training;Visual/perceptual remediation/compensation;Therapeutic activities;Balance training;Manual Therapy;DME and/or AE instruction    Plan reduce down to 1x/wk,  continue progress towards goals    Consulted and Agree with Plan of Care Patient           Patient will benefit from skilled therapeutic intervention in order to improve the following deficits and impairments:   Body Structure / Function / Physical Skills: ADL, Coordination, UE functional use, Vestibular, Vision, Strength, IADL, Decreased knowledge of use of DME, Decreased knowledge of precautions, ROM, FMC, Dexterity, Balance, Endurance, GMC Cognitive Skills: Attention, Safety Awareness     Visit Diagnosis: Visuospatial deficit    Problem List Patient Active Problem List   Diagnosis Date Noted  . Stroke ([place]) 06/22/2020  . Arthralgia 07/14/2019  . Pulmonary vein stenosis s/p stenting 2020 01/02/2019  . Chronic neck pain 08/23/2018  . Tinnitus 08/23/2018  . Lipoma 08/23/2018  . Paroxysmal atrial fibrillation ([place]) 05/25/2018  . Diverticulosis 06/20/2017  . Hemorrhoids, external 06/20/2017  . Benign prostatic hyperplasia without urinary obstruction 06/17/2014  . BPPV (benign paroxysmal positional vertigo) 07/01/2011  . DEGENERATIVE JOINT DISEASE, HANDS 10/16/2009  . NEUROPATHY 03/01/2008    Carey Bullocks, OTR/L 08/20/2020, 2:16 PM  [facility] [address] [place], [place], [address] Phone: [phone]   Fax:  [phone]  Name: William Underwood MRN: [id number] Date of Birth: [birth year]

## 2020-08-20 NOTE — Therapy (Signed)
[facility] [address] [place], [place], [address] Phone: [phone]   Fax:  [phone]  Physical Therapy Treatment  Patient Details  Name: William Underwood MRN: [id number] Date of Birth: Mar 17, 1951 Referring Provider (PT): Garvin Fila, MD   Encounter Date: 08/20/2020   PT End of Session - 08/20/20 1400    Visit Number 14    Number of Visits 17    Date for PT Re-Evaluation 09/25/20   written for 60 day POC   Authorization Type HTA 2021 - $15 [employer] PAY    PT Start Time 6812    PT Stop Time 1357    PT Time Calculation (min) 40 min    Equipment Utilized During Treatment Gait belt    Activity Tolerance Patient tolerated treatment well    Behavior During Therapy WFL for tasks assessed/performed           Past Medical History:  Diagnosis Date  . Benign prostatic hyperplasia without urinary obstruction 06/17/2014  . BPPV (benign paroxysmal positional vertigo) 07/01/2011  . Chronic hyperkalemia 05/20/2012   Overview:  Hx of for years  . Concussion syndrome 05/15/2011   This seems like a moderately severe concussion with at least 1 minute loss of consciousness in 15-20 minutes of significant confusion including being unable to state his name   . DEGENERATIVE JOINT DISEASE, HANDS 10/16/2009   Qualifier: Diagnosis of  By: [name], [name]    . Diverticulosis 06/20/2017  . Hemorrhoids, external 06/20/2017  . Lumbar spondylosis 06/20/2017   Fairly significant L4-L5 facet arthropathy with effusion.  Can consider facet injections and potentially RFA down the road.  Marland Kitchen NEUROPATHY 03/01/2008   Qualifier: Diagnosis of  By: [name], [name]    . Paroxysmal atrial fibrillation (HCC)   . Pulmonary insufficiency 03/09/2018   moderate PI noted on echo 03/09/18  . Pulmonary vein stenosis    left inferior pulmonary vein stenosis  . Right inguinal hernia 06/17/2014  . Sinus bradycardia   . Strain of gluteus medius 01/29/2017  . Stroke  ([place]) 05/2020  . Vertigo     Past Surgical History:  Procedure Laterality Date  . ANKLE ARTHROSCOPY    . ATRIAL FIBRILLATION ABLATION N/A 05/25/2018   Procedure: ATRIAL FIBRILLATION ABLATION;  Surgeon: Thompson Grayer, MD;  Location: [facility];  Service: Cardiovascular;  Laterality: N/A;  . KNEE ARTHROSCOPY      There were no vitals filed for this visit.   Subjective Assessment - 08/20/20 1320    Subjective States that his vision that he can see without double vision is getting much better.    Patient is accompained by: --   [name]   Pertinent History BPPV, paroxysmal atrial fibrillation s/p ablation and pulmonary vein stenosis, hx of concussion (2012)    Diagnostic tests Numerous areas of acute infarct. Multiple small infarcts are presentin the posterior circulation including the pons and midbrain in themidline as well as small acute infarcts in the left frontal andparietal cortex. Small acute infarct left thalamus. Findingscompatible with multiple emboli. No hemorrhage.    Patient Stated Goals wants to be able to get back to road cycling.    Currently in Pain? No/denies                             [facility] Adult PT Treatment/Exercise - 08/20/20 1345      Transfers   Comments x10 reps sit <> stands  on red balance beam  with no UE support, cues for forward weight shift and maintaining balance in standing before lowering slowly to mat table, pt with incr difficulty with eccentric control           Access Code: [email or web address] URL: [email or web address] Date: 08/20/2020 Prepared by: Janann August   Reviewed pt's HEP and made revisions/updates as appropriate:   Exercises Romberg Stance Eyes Closed on Foam Pad - 1 x daily - 7 x weekly - 3 sets - 30 reps Standing Balance with Eyes Closed on Foam - 1 x daily - 7 x weekly - 2 sets - 10 reps - upgraded to more narrow BOS with head turns/head nods x10 reps each and adding in 5 reps diagonal head motions    Tandem Stance - 1 x daily - 7 x weekly - 3 sets - 20-30 hold - progressed to holding for 20-30 seconds  Standing Single Leg Stance with Counter Support - 1 x daily - 7 x weekly - 3 sets - 10 hold - progressed to standing on single pillow for compliant surface  Tandem Walking - 1 x daily - 7 x weekly - 3 sets Standing Gaze Stabilization with Head Rotation - 1 x daily - 7 x weekly - 3 sets - 45 hold Standing Romberg to 1/2 Tandem Stance - 1 x daily - 7 x weekly - 3 sets - 30 hold      Balance Exercises - 08/20/20 1353      Balance Exercises: Standing   Sit to Stand Without upper extremity support;Foam/compliant surface;Limitations    Sit to Stand Limitations x5 reps eyes closed on blue air ex for incr vestibular input for balance    Other Standing Exercises Comments standing on red balance beam: alternating heel taps to floor x12 reps B, alternating backwards stepping x10 reps B, intermittent min guard for balance             PT Education - 08/20/20 2223    Education Details upgrades to HEP - checking goals at next session    Person(s) Educated Patient    Methods Explanation    Comprehension Verbalized understanding            PT Short Term Goals - 07/26/20 1114      PT SHORT TERM GOAL #1   Title Pt will be independent with initial HEP in order to build upon functional gains made in therapy. ALL STGS DUE 07/26/20    Time 4    Period Weeks    Status Achieved    Target Date 07/26/20      PT SHORT TERM GOAL #2   Title Pt will improve FGA score to at least a 21/30 in order to demo decr fall risk.    Baseline FGA 18/30 on 07/05/20, 27/30    Time 4    Period Weeks    Status Achieved      PT SHORT TERM GOAL #3   Title Pt will decr TUG time to 13.5 seconds or less with no AD in order to demo decr fall risk.    Baseline 14.97 no AD, 8.22 SECONDS ON 07/26/20    Time 4    Period Weeks    Status Achieved             PT Long Term Goals - 08/20/20 1357      PT LONG TERM  GOAL #1   Title Pt will be independent with FINAL HEP in order to build upon  functional gains made in therapy. ALL LTGS DUE 08/23/20    Baseline MADE UPDATES on 08/20/20    Time 8    Period Weeks    Status Achieved      PT LONG TERM GOAL #2   Title Pt will improve FGA score to at least 29/30 in order to demo decr fall risk.    Baseline 27/30    Time 8    Period Weeks    Status Revised      PT LONG TERM GOAL #3   Title Pt will improve gait speed with no AD vs. LRAD to at least 3.0 ft/sec in order to demo improved gait efficiency.    Time 8    Period Weeks    Status New      PT LONG TERM GOAL #4   Title Pt will be able to walk 1,000' outdoors over unlevel surfaces with supervision with no LOB in order to demo improved community mobility with no AD vs. LRAD.    Time 8    Period Weeks    Status New                 Plan - 08/20/20 2225    Clinical Impression Statement Upgraded pt's HEP with focus on vision removed balance, SLS, and narrow BOS. Pt with decr eccentric control when performing sit <> stands on compliant surfaces. Will continue to progress towards LTGs.    Personal Factors and Comorbidities Comorbidity 3+;Fitness;Past/Current Experience;Behavior Pattern   pt extremely active prior to stroke   Comorbidities BPPV, paroxysmal atrial fibrillation s/p ablation and pulmonary vein stenosis, hx of concussion (2012)    Examination-Activity Limitations Stand;Squat;Transfers;Locomotion Level;Stairs    Examination-Participation Restrictions Community Activity;Driving   running, cycling   Stability/Clinical Decision Making Evolving/Moderate complexity    Rehab Potential Good    PT Frequency 2x / week    PT Duration 8 weeks    PT Treatment/Interventions ADLs/Self Care Home Management;DME Instruction;Gait training;Stair training;Therapeutic activities;Functional mobility training;Therapeutic exercise;Neuromuscular re-education;Balance training;Patient/family  education;Vestibular;Visual/perceptual remediation/compensation    PT Next Visit Plan check LTGS - discuss recert? level balance (SLS, tandem stance, vestibular input for balance), dynamic gait training, hip ABD strengthening    Consulted and Agree with Plan of Care Patient   pt's gf          Patient will benefit from skilled therapeutic intervention in order to improve the following deficits and impairments:  Abnormal gait, Decreased balance, Decreased coordination, Decreased endurance, Decreased safety awareness, Decreased knowledge of use of DME, Decreased strength, Dizziness, Difficulty walking, Impaired vision/preception, Impaired sensation  Visit Diagnosis: Muscle weakness (generalized)  Hemiplegia and hemiparesis following cerebral infarction affecting right dominant side (HCC)  Other abnormalities of gait and mobility  Unsteadiness on feet     Problem List Patient Active Problem List   Diagnosis Date Noted  . Stroke ([place]) 06/22/2020  . Arthralgia 07/14/2019  . Pulmonary vein stenosis s/p stenting 2020 01/02/2019  . Chronic neck pain 08/23/2018  . Tinnitus 08/23/2018  . Lipoma 08/23/2018  . Paroxysmal atrial fibrillation ([place]) 05/25/2018  . Diverticulosis 06/20/2017  . Hemorrhoids, external 06/20/2017  . Benign prostatic hyperplasia without urinary obstruction 06/17/2014  . BPPV (benign paroxysmal positional vertigo) 07/01/2011  . DEGENERATIVE JOINT DISEASE, HANDS 10/16/2009  . NEUROPATHY 03/01/2008    Arliss Journey, PT, DPT  08/20/2020, 10:28 PM  [facility] [address] [place], [place], [address] Phone: [phone]   Fax:  [phone]  Name: William Mcshan  Underwood MRN: [id number] Date of Birth: [birth year]

## 2020-08-20 NOTE — Patient Instructions (Signed)
Access Code: [email or web address] URL: [email or web address] Date: 08/20/2020 Prepared by: Janann August  Exercises Romberg Stance Eyes Closed on Foam Pad - 1 x daily - 7 x weekly - 3 sets - 30 reps Standing Balance with Eyes Closed on Foam - 1 x daily - 7 x weekly - 2 sets - 10 reps Tandem Stance - 1 x daily - 7 x weekly - 3 sets - 20-30 hold Standing Single Leg Stance with Counter Support - 1 x daily - 7 x weekly - 3 sets - 10 hold Tandem Walking - 1 x daily - 7 x weekly - 3 sets Standing Gaze Stabilization with Head Rotation - 1 x daily - 7 x weekly - 3 sets - 45 hold Standing Romberg to 1/2 Tandem Stance - 1 x daily - 7 x weekly - 3 sets - 30 hold

## 2020-08-22 ENCOUNTER — Encounter: Payer: Self-pay | Admitting: Physical Therapy

## 2020-08-22 ENCOUNTER — Other Ambulatory Visit: Payer: Self-pay

## 2020-08-22 ENCOUNTER — Ambulatory Visit: Payer: PPO | Admitting: Physical Therapy

## 2020-08-22 ENCOUNTER — Ambulatory Visit: Payer: PPO | Admitting: Occupational Therapy

## 2020-08-22 DIAGNOSIS — M6281 Muscle weakness (generalized): Secondary | ICD-10-CM | POA: Diagnosis not present

## 2020-08-22 DIAGNOSIS — R2689 Other abnormalities of gait and mobility: Secondary | ICD-10-CM

## 2020-08-22 NOTE — Therapy (Signed)
[facility] [address] [place], [place], [address] Phone: [phone]   Fax:  [phone]  Physical Therapy Treatment/Discharge Summary  Patient Details  Name: William Underwood MRN: [id number] Date of Birth: Dec 13, 1950 Referring Provider (PT): Garvin Fila, MD   Encounter Date: 08/22/2020   PT End of Session - 08/22/20 1531    Visit Number 15    Number of Visits 17    Date for PT Re-Evaluation 09/25/20   written for 60 day POC   Authorization Type HTA 2021 - $15 CO PAY    PT Start Time 1447   full time not needed due to D/C   PT Stop Time 1525    PT Time Calculation (min) 38 min    Equipment Utilized During Treatment Gait belt    Activity Tolerance Patient tolerated treatment well    Behavior During Therapy WFL for tasks assessed/performed           Past Medical History:  Diagnosis Date  . Benign prostatic hyperplasia without urinary obstruction 06/17/2014  . BPPV (benign paroxysmal positional vertigo) 07/01/2011  . Chronic hyperkalemia 05/20/2012   Overview:  Hx of for years  . Concussion syndrome 05/15/2011   This seems like a moderately severe concussion with at least 1 minute loss of consciousness in 15-20 minutes of significant confusion including being unable to state his name   . DEGENERATIVE JOINT DISEASE, HANDS 10/16/2009   Qualifier: Diagnosis of  By: [name], [name]    . Diverticulosis 06/20/2017  . Hemorrhoids, external 06/20/2017  . Lumbar spondylosis 06/20/2017   Fairly significant L4-L5 facet arthropathy with effusion.  Can consider facet injections and potentially RFA down the road.  Marland Kitchen NEUROPATHY 03/01/2008   Qualifier: Diagnosis of  By: [name], [name]    . Paroxysmal atrial fibrillation (HCC)   . Pulmonary insufficiency 03/09/2018   moderate PI noted on echo 03/09/18  . Pulmonary vein stenosis    left inferior pulmonary vein stenosis  . Right inguinal hernia 06/17/2014  . Sinus bradycardia     . Strain of gluteus medius 01/29/2017  . Stroke ([place]) 05/2020  . Vertigo     Past Surgical History:  Procedure Laterality Date  . ANKLE ARTHROSCOPY    . ATRIAL FIBRILLATION ABLATION N/A 05/25/2018   Procedure: ATRIAL FIBRILLATION ABLATION;  Surgeon: Thompson Grayer, MD;  Location: [facility];  Service: Cardiovascular;  Laterality: N/A;  . KNEE ARTHROSCOPY      There were no vitals filed for this visit.   Subjective Assessment - 08/22/20 1450    Subjective Doing good. No changes.    Patient is accompained by: --   [name]   Pertinent History BPPV, paroxysmal atrial fibrillation s/p ablation and pulmonary vein stenosis, hx of concussion (2012)    Diagnostic tests Numerous areas of acute infarct. Multiple small infarcts are presentin the posterior circulation including the pons and midbrain in themidline as well as small acute infarcts in the left frontal andparietal cortex. Small acute infarct left thalamus. Findingscompatible with multiple emboli. No hemorrhage.    Patient Stated Goals wants to be able to get back to road cycling.    Currently in Pain? No/denies              [facility] PT Assessment - 08/22/20 1451      Standardized Balance Assessment   Standardized Balance Assessment Mini-BESTest      Mini-BESTest   Sit To Stand Normal: Comes to stand without use  of hands and stabilizes independently.    Rise to Toes Moderate: Heels up, but not full range (smaller than when holding hands), OR noticeable instability for 3 s.    Stand on one leg (left) Moderate: < 20 s   2.66, 2.03   Stand on one leg (right) Moderate: < 20 s   4.19, 2.56   Stand on one leg - lowest score 1    Compensatory Stepping Correction - Forward Normal: Recovers independently with a single, large step (second realignement is allowed).    Compensatory Stepping Correction - Backward Normal: Recovers independently with a single, large step    Compensatory Stepping Correction - Left Lateral Normal: Recovers  independently with 1 step (crossover or lateral OK)    Compensatory Stepping Correction - Right Lateral Normal: Recovers independently with 1 step (crossover or lateral OK)    Stepping Corredtion Lateral - lowest score 2    Stance - Feet together, eyes open, firm surface  Normal: 30s    Change in Gait Speed Normal: Significantly changes walkling speed without imbalance    Walk with head turns - Horizontal Normal: performs head turns with no change in gait speed and good balance    Walk with pivot turns Normal: Turns with feet close FAST (< 3 steps) with good balance.    Step over obstacles Normal: Able to step over box with minimal change of gait speed and with good balance.      Functional Gait  Assessment   Gait assessed  Yes    Gait Level Surface Walks 20 ft in less than 5.5 sec, no assistive devices, good speed, no evidence for imbalance, normal gait pattern, deviates no more than 6 in outside of the 12 in walkway width.    Change in Gait Speed Able to smoothly change walking speed without loss of balance or gait deviation. Deviate no more than 6 in outside of the 12 in walkway width.    Gait with Horizontal Head Turns Performs head turns smoothly with no change in gait. Deviates no more than 6 in outside 12 in walkway width    Gait with Vertical Head Turns Performs head turns with no change in gait. Deviates no more than 6 in outside 12 in walkway width.    Gait and Pivot Turn Pivot turns safely within 3 sec and stops quickly with no loss of balance.    Step Over Obstacle Is able to step over 2 stacked shoe boxes taped together (9 in total height) without changing gait speed. No evidence of imbalance.    Gait with Narrow Base of Support Ambulates 4-7 steps.    Gait with Eyes Closed Walks 20 ft, no assistive devices, good speed, no evidence of imbalance, normal gait pattern, deviates no more than 6 in outside 12 in walkway width. Ambulates 20 ft in less than 7 sec.    Ambulating Backwards  Walks 20 ft, no assistive devices, good speed, no evidence for imbalance, normal gait    Steps Alternating feet, no rail.    Total Score 28    FGA comment: 28/30                         [facility] Adult PT Treatment/Exercise - 08/22/20 1451      Ambulation/Gait   Ambulation/Gait Yes    Ambulation/Gait Assistance 6: Modified independent (Device/Increase time)    Ambulation/Gait Assistance Details outdoors overr paved/gravel surfaces    Ambulation Distance (Feet) 1000 Feet  Assistive device None    Gait Pattern Step-through pattern    Ambulation Surface Level;Unlevel;Indoor;Outdoor;Paved;Grass    Gait velocity 8.12 seconds = 4.03 ft/sec      Mini-BESTest   Stance - Feet together, eyes closed, foam surface  Normal: 30s    Incline - Eyes Closed Normal: Stands independently 30s and aligns with gravity    Timed UP & GO with Dual Task Normal: No noticeable change in sitting, standing or walking while backward counting when compared to TUG without   8.44, 8.78 cog   Mini-BEST total score 26      High Level Balance   High Level Balance Comments mCTSIB: condition 1: 30 seconds, condition 2: 12.28 seconds, condition 3: 30 seconds, condition 4: 30 seconds with mod postural sway                  PT Education - 08/22/20 1529    Education Details progress towards goals - discharging at this session due to progress, gave name of new neuro -opth MD at [facility] (pt reporting that this MD is in his insurance network)    Person(s) Educated Patient    Methods Explanation    Comprehension Verbalized understanding            PT Short Term Goals - 07/26/20 1114      PT SHORT TERM GOAL #1   Title Pt will be independent with initial HEP in order to build upon functional gains made in therapy. ALL STGS DUE 07/26/20    Time 4    Period Weeks    Status Achieved    Target Date 07/26/20      PT SHORT TERM GOAL #2   Title Pt will improve FGA score to at least a 21/30 in order  to demo decr fall risk.    Baseline FGA 18/30 on 07/05/20, 27/30    Time 4    Period Weeks    Status Achieved      PT SHORT TERM GOAL #3   Title Pt will decr TUG time to 13.5 seconds or less with no AD in order to demo decr fall risk.    Baseline 14.97 no AD, 8.22 SECONDS ON 07/26/20    Time 4    Period Weeks    Status Achieved             PT Long Term Goals - 08/22/20 1456      PT LONG TERM GOAL #1   Title Pt will be independent with FINAL HEP in order to build upon functional gains made in therapy. ALL LTGS DUE 08/23/20    Baseline MADE UPDATES on 08/20/20    Time 8    Period Weeks    Status Achieved      PT LONG TERM GOAL #2   Title Pt will improve FGA score to at least 29/30 in order to demo decr fall risk.    Baseline 27/30 - 28/30 on 08/22/20    Time 8    Period Weeks    Status Not Met      PT LONG TERM GOAL #3   Title Pt will improve gait speed with no AD vs. LRAD to at least 3.0 ft/sec in order to demo improved gait efficiency.    Baseline 8.12 seconds = 4.03 ft.sec    Time 8    Period Weeks    Status Achieved      PT LONG TERM GOAL #4   Title Pt will be  able to walk 1,000' outdoors over unlevel surfaces with supervision with no LOB in order to demo improved community mobility with no AD vs. LRAD.    Baseline met with mod I    Time 8    Period Weeks    Status Achieved           PHYSICAL THERAPY DISCHARGE SUMMARY  Visits from Start of Care: 15  Current functional level related to goals / functional outcomes: See LTGs   Remaining deficits: Difficulties with SLS, impaired vestibular input for balance   Education / Equipment: HEP  Plan: Patient agrees to discharge.  Patient goals were met. Patient is being discharged due to meeting the stated rehab goals.  ?????            Plan - 08/22/20 1559    Clinical Impression Statement Focus of today's skilled session was assessing remaining LTGs - pt met LTG #3 and #4 in regards to gait speed and  gait outdoors. Pt's gait speed of 4.03 ft/sec is almost normal walking speed and pt able to ambulate over unlevel surfaces with mod I. Pt did not quite meet LTG #2 in regards to FGA, improved to 28/30 from 27/30, however still having difficulties with tandem gait. Reviewed and finalized pt's HEP at previous session with pt understanding importance of performing. Pt continues with difficulty with SLS and balance with eyes closed (premorbid from hx of concussion). Due to improvements in PT and meeting goals pt is in agreement to D/C at this time.    Personal Factors and Comorbidities Comorbidity 3+;Fitness;Past/Current Experience;Behavior Pattern   pt extremely active prior to stroke   Comorbidities BPPV, paroxysmal atrial fibrillation s/p ablation and pulmonary vein stenosis, hx of concussion (2012)    Examination-Activity Limitations Stand;Squat;Transfers;Locomotion Level;Stairs    Examination-Participation Restrictions Community Activity;Driving   running, cycling   Stability/Clinical Decision Making Evolving/Moderate complexity    Rehab Potential Good    PT Frequency 2x / week    PT Duration 8 weeks    PT Treatment/Interventions ADLs/Self Care Home Management;DME Instruction;Gait training;Stair training;Therapeutic activities;Functional mobility training;Therapeutic exercise;Neuromuscular re-education;Balance training;Patient/family education;Vestibular;Visual/perceptual remediation/compensation    PT Next Visit Plan D/C    Consulted and Agree with Plan of Care Patient   pt's gf          Patient will benefit from skilled therapeutic intervention in order to improve the following deficits and impairments:  Abnormal gait, Decreased balance, Decreased coordination, Decreased endurance, Decreased safety awareness, Decreased knowledge of use of DME, Decreased strength, Dizziness, Difficulty walking, Impaired vision/preception, Impaired sensation  Visit Diagnosis: Other abnormalities of gait and  mobility     Problem List Patient Active Problem List   Diagnosis Date Noted  . Stroke ([place]) 06/22/2020  . Arthralgia 07/14/2019  . Pulmonary vein stenosis s/p stenting 2020 01/02/2019  . Chronic neck pain 08/23/2018  . Tinnitus 08/23/2018  . Lipoma 08/23/2018  . Paroxysmal atrial fibrillation ([place]) 05/25/2018  . Diverticulosis 06/20/2017  . Hemorrhoids, external 06/20/2017  . Benign prostatic hyperplasia without urinary obstruction 06/17/2014  . BPPV (benign paroxysmal positional vertigo) 07/01/2011  . DEGENERATIVE JOINT DISEASE, HANDS 10/16/2009  . NEUROPATHY 03/01/2008    Arliss Journey , PT, DPT  08/22/2020, 4:00 PM  [facility] [address] [place], [place], [address] Phone: [phone]   Fax:  [phone]  Name: William Underwood MRN: [id number] Date of Birth: 01/28/1951

## 2020-08-24 ENCOUNTER — Other Ambulatory Visit: Payer: Self-pay

## 2020-08-24 ENCOUNTER — Ambulatory Visit: Payer: PPO | Admitting: Internal Medicine

## 2020-08-24 ENCOUNTER — Encounter: Payer: Self-pay | Admitting: Internal Medicine

## 2020-08-24 VITALS — BP 110/70 | HR 49 | Ht 66.0 in | Wt 156.0 lb

## 2020-08-24 DIAGNOSIS — R0602 Shortness of breath: Secondary | ICD-10-CM | POA: Diagnosis not present

## 2020-08-24 DIAGNOSIS — Q268 Other congenital malformations of great veins: Secondary | ICD-10-CM | POA: Diagnosis not present

## 2020-08-24 DIAGNOSIS — I639 Cerebral infarction, unspecified: Secondary | ICD-10-CM | POA: Diagnosis not present

## 2020-08-24 DIAGNOSIS — Z9889 Other specified postprocedural states: Secondary | ICD-10-CM | POA: Diagnosis not present

## 2020-08-24 DIAGNOSIS — Z8679 Personal history of other diseases of the circulatory system: Secondary | ICD-10-CM

## 2020-08-24 DIAGNOSIS — I480 Paroxysmal atrial fibrillation: Secondary | ICD-10-CM | POA: Diagnosis not present

## 2020-08-24 NOTE — Patient Instructions (Signed)
Medication Instructions:  No Changes In Medications at this time.  *If you need a refill on your cardiac medications before your next appointment, please call your pharmacy*  Lab Work: None Ordered At This Time.  If you have labs (blood work) drawn today and your tests are completely normal, you will receive your results only by: . MyChart Message (if you have MyChart) OR . A paper copy in the mail If you have any lab test that is abnormal or we need to change your treatment, we will call you to review the results.  Testing/Procedures: None Ordered At This Time.   Follow-Up: At [facility], you and your health needs are our priority.  As part of our continuing mission to provide you with exceptional heart care, we have created designated Provider Care Teams.  These Care Teams include your primary Cardiologist (physician) and Advanced Practice Providers (APPs -  Physician Assistants and Nurse Practitioners) who all work together to provide you with the care you need, when you need it.  We recommend signing up for the patient portal called "MyChart".  Sign up information is provided on this After Visit Summary.  MyChart is used to connect with patients for Virtual Visits (Telemedicine).  Patients are able to view lab/test results, encounter notes, upcoming appointments, etc.  Non-urgent messages can be sent to your provider as well.   To learn more about what you can do with MyChart, go to [email or web address].    Your next appointment:   6 month(s)  The format for your next appointment:   In Person  Provider:   Gayatri Acharya, MD  

## 2020-08-24 NOTE — Progress Notes (Signed)
Cardiology Office Note:    Date:  08/24/2020   ID:  William Underwood, DOB 10-16-1951, MRN [id number]  PCP:  William Barrack, MD  Cardiologist:  No primary care provider on file.  Electrophysiologist:  None   Referring MD: William Barrack, MD   Chief Complaint/Reason for Referral: Stroke, prior A. fib ablation, prior pulmonary vein stents for pulmonary vein stenosis  History of Present Illness:    William Underwood is a 69 y.o. male well-known to my clinic.  He is also followed by Dr. [name] status post pulmonary vein isolation for atrial fibrillation, and followed at the [facility] clinic for pulmonary vein stenosis following A. fib ablation.  He is undergone pulmonary vein stenting with restenosis of pulmonary vein stent, with no cardiopulmonary limitation on recent cardiopulmonary exercise testing.  Unfortunately in August he had an embolic appearing stroke.  He had continued on anticoagulation after pulmonary vein stenting for 1 year and instructed to stop by [facility] in April 2021.  He has not had a recurrence of atrial fibrillation that he is aware of and monitors his heart rhythm at home with wearable technology.  He was seen post CVA by Dr. [name].  He is participating with therapy and improving greatly.  Still suffers with peripheral diplopia post stroke.  He is continuing on Eliquis 5 mg twice daily without bleeding complication.  No change in exertional symptoms as compared to her last visit.  He plans to follow-up with [facility] clinic at their annual appointment in April but may choose to have this imaging performed at [facility].  Past Medical History:  Diagnosis Date  . Benign prostatic hyperplasia without urinary obstruction 06/17/2014  . BPPV (benign paroxysmal positional vertigo) 07/01/2011  . Chronic hyperkalemia 05/20/2012   Overview:  Hx of for years  . Concussion syndrome 05/15/2011   This seems like a moderately severe concussion with at least 1 minute loss of consciousness in  15-20 minutes of significant confusion including being unable to state his name   . DEGENERATIVE JOINT DISEASE, HANDS 10/16/2009   Qualifier: Diagnosis of  By: [name], William Underwood    . Diverticulosis 06/20/2017  . Hemorrhoids, external 06/20/2017  . Lumbar spondylosis 06/20/2017   Fairly significant L4-L5 facet arthropathy with effusion.  Can consider facet injections and potentially RFA down the road.  Marland Kitchen NEUROPATHY 03/01/2008   Qualifier: Diagnosis of  By: [name], William Underwood    . Paroxysmal atrial fibrillation (HCC)   . Pulmonary insufficiency 03/09/2018   moderate PI noted on echo 03/09/18  . Pulmonary vein stenosis    left inferior pulmonary vein stenosis  . Right inguinal hernia 06/17/2014  . Sinus bradycardia   . Strain of gluteus medius 01/29/2017  . Stroke ([place]) 05/2020  . Vertigo     Past Surgical History:  Procedure Laterality Date  . ANKLE ARTHROSCOPY    . ATRIAL FIBRILLATION ABLATION N/A 05/25/2018   Procedure: ATRIAL FIBRILLATION ABLATION;  Surgeon: William Grayer, MD;  Location: [facility];  Service: Cardiovascular;  Laterality: N/A;  . KNEE ARTHROSCOPY      Current Medications: Current Meds  Medication Sig  . apixaban (ELIQUIS) 5 MG TABS tablet Take 1 tablet (5 mg total) by mouth 2 (two) times daily.  Marland Kitchen atorvastatin (LIPITOR) 80 MG tablet Take 1 tablet (80 mg total) by mouth daily.  . diazepam (VALIUM) 5 MG tablet Take 1 tablet (5 mg total) by mouth daily as needed for anxiety.  . Multiple Vitamins-Minerals (MULTIVITAMIN WITH MINERALS) tablet  Take 1 tablet by mouth daily.   . traMADol (ULTRAM) 50 MG tablet TAKE 1 TABLET (50 MG TOTAL) BY MOUTH EVERY 6 (SIX) HOURS AS NEEDED FOR MODERATE PAIN.  Marland Kitchen Vitamin D, Ergocalciferol, 2000 units CAPS Take 2,000 Units by mouth daily.      Allergies:   Cefdinir and Codeine   Social History   Tobacco Use  . Smoking status: Never Smoker  . Smokeless tobacco: Never Used  Vaping Use  . Vaping Use: Never used  Substance Use Topics  .  Alcohol use: No  . Drug use: No     Family History: The patient's family history includes Hearing loss in his father. There is no history of Colon cancer.  ROS:   Please see the history of present illness.    All other systems reviewed and are negative.  EKGs/Labs/Other Studies Reviewed:    The following studies were reviewed today:  EKG: Sinus rhythm, rate 49 beats minute  Recent Labs: 06/22/2020: ALT 21; Magnesium 2.0 06/23/2020: BUN 18; Creatinine, Ser 0.78; Hemoglobin 13.5; Platelets 178; Potassium 4.1; Sodium 139  Recent Lipid Panel    Component Value Date/Time   CHOL 174 06/23/2020 0338   TRIG 35 06/23/2020 0338   HDL 59 06/23/2020 0338   CHOLHDL 2.9 06/23/2020 0338   VLDL 7 06/23/2020 0338   LDLCALC 108 (H) 06/23/2020 0338    Physical Exam:    VS:  BP 110/70   Pulse (!) 49   Ht 5\' 6"  (1.676 m)   Wt 156 lb (70.8 kg)   SpO2 98%   BMI 25.18 kg/m     Wt Readings from Last 5 Encounters:  08/24/20 156 lb (70.8 kg)  07/04/20 152 lb (68.9 kg)  07/04/20 151 lb (68.5 kg)  07/04/20 151 lb (68.5 kg)  06/22/20 152 lb 8.9 oz (69.2 kg)    Constitutional: No acute distress Eyes: sclera non-icteric, normal conjunctiva and lids ENMT: normal dentition, moist mucous membranes Cardiovascular: regular rhythm, bradycardic, no murmurs. S1 and S2 normal. Radial pulses normal bilaterally. No jugular venous distention.  Respiratory: clear to auscultation bilaterally GI : normal bowel sounds, soft and nontender. No distention.   MSK: extremities warm, well perfused. No edema.  NEURO:  moves all extremities. PSYCH: alert and oriented x 3, normal mood and affect.   ASSESSMENT:    1. Paroxysmal atrial fibrillation (HCC)   2. S/P ablation of atrial fibrillation   3. Pulmonary vein stenosis   4. SOB (shortness of breath)   5. Cerebrovascular accident (CVA), unspecified mechanism ([place])    PLAN:    Recent CVA -He continues on Eliquis and atorvastatin.  Encouraged him to  continue these given unclear nidus for stroke.  We can consider TEE if concerns persist and can visualize the proximal pulmonary veins, left atrial appendage, and left atrium for source of stroke.  Can also visualize valves as well as aorta for abnormality.  He feels most likely cause of stroke is prior pulmonary vein stenting.  Paroxysmal atrial fibrillation -No definite recurrence.  He should continue to monitor at home with wearable technology.  Pulmonary vein stenosis -Clinically stable, repeat imaging planned at [facility] clinic in the spring 2022.  If patient prefers we can perform requested imaging studies at our facility if capabilities available.   Total time of encounter: 30 minutes total time of encounter, including 20 minutes spent in face-to-face patient care on the date of this encounter. This time includes coordination of care and counseling regarding above mentioned problem  list. Remainder of non-face-to-face time involved reviewing chart documents/testing relevant to the patient encounter and documentation in the medical record. I have independently reviewed documentation from referring provider.   Cherlynn Kaiser, MD [place]  [facility]    Medication Adjustments/Labs and Tests Ordered: Current medicines are reviewed at length with the patient today.  Concerns regarding medicines are outlined above.   Orders Placed This Encounter  Procedures  . EKG 12-Lead    No orders of the defined types were placed in this encounter.   Patient Instructions  Medication Instructions:  No Changes In Medications at this time.  *If you need a refill on your cardiac medications before your next appointment, please call your pharmacy*  Lab Work: None Ordered At This Time.  If you have labs (blood work) drawn today and your tests are completely normal, you will receive your results only by: Marland Kitchen MyChart Message (if you have MyChart) OR . A paper copy in the mail If you have any  lab test that is abnormal or we need to change your treatment, we will call you to review the results.  Testing/Procedures: None Ordered At This Time.   Follow-Up: At [facility], you and your health needs are our priority.  As part of our continuing mission to provide you with exceptional heart care, we have created designated Provider Care Teams.  These Care Teams include your primary Cardiologist (physician) and Advanced Practice Providers (APPs -  Physician Assistants and Nurse Practitioners) who all work together to provide you with the care you need, when you need it.  We recommend signing up for the patient portal called "MyChart".  Sign up information is provided on this After Visit Summary.  MyChart is used to connect with patients for Virtual Visits (Telemedicine).  Patients are able to view lab/test results, encounter notes, upcoming appointments, etc.  Non-urgent messages can be sent to your provider as well.   To learn more about what you can do with MyChart, go to [email or web address].    Your next appointment:   6 month(s)  The format for your next appointment:   In Person  Provider:   Cherlynn Kaiser, MD

## 2020-08-27 ENCOUNTER — Ambulatory Visit: Payer: PPO

## 2020-08-27 ENCOUNTER — Other Ambulatory Visit: Payer: Self-pay

## 2020-08-27 ENCOUNTER — Ambulatory Visit: Payer: PPO | Attending: Neurology | Admitting: Occupational Therapy

## 2020-08-27 ENCOUNTER — Encounter: Payer: Self-pay | Admitting: Occupational Therapy

## 2020-08-27 DIAGNOSIS — I69351 Hemiplegia and hemiparesis following cerebral infarction affecting right dominant side: Secondary | ICD-10-CM

## 2020-08-27 DIAGNOSIS — R41842 Visuospatial deficit: Secondary | ICD-10-CM | POA: Diagnosis not present

## 2020-08-27 DIAGNOSIS — R278 Other lack of coordination: Secondary | ICD-10-CM | POA: Insufficient documentation

## 2020-08-27 DIAGNOSIS — R2681 Unsteadiness on feet: Secondary | ICD-10-CM | POA: Insufficient documentation

## 2020-08-27 DIAGNOSIS — R2689 Other abnormalities of gait and mobility: Secondary | ICD-10-CM | POA: Insufficient documentation

## 2020-08-27 DIAGNOSIS — M6281 Muscle weakness (generalized): Secondary | ICD-10-CM | POA: Diagnosis not present

## 2020-08-27 NOTE — Therapy (Signed)
[facility] [address] [place], [place], [address] Phone: [phone]   Fax:  [phone]  Occupational Therapy Treatment  Patient Details  Name: William Underwood MRN: [id number] Date of Birth: 10/01/1951 Referring Provider (OT): Dr. Leonie Man   Encounter Date: 08/27/2020   OT End of Session - 08/27/20 1319    Visit Number 6    Number of Visits 17    Date for OT Re-Evaluation 09/11/20    Authorization Type [employer] P[employer]    Authorization Time Period VL: [place] no auth Week 3/8 (11/1)    Authorization - Visit Number 6    Authorization - Number of Visits 10    Progress Note Due on Visit 10    OT Start Time 1638    OT Stop Time 1335   end early d/t discharge   OT Time Calculation (min) 17 min    Activity Tolerance Patient tolerated treatment well    Behavior During Therapy [facility] for tasks assessed/performed           Past Medical History:  Diagnosis Date  . Benign prostatic hyperplasia without urinary obstruction 06/17/2014  . BPPV (benign paroxysmal positional vertigo) 07/01/2011  . Chronic hyperkalemia 05/20/2012   Overview:  Hx of for years  . Concussion syndrome 05/15/2011   This seems like a moderately severe concussion with at least 1 minute loss of consciousness in 15-20 minutes of significant confusion including being unable to state his name   . DEGENERATIVE JOINT DISEASE, HANDS 10/16/2009   Qualifier: Diagnosis of  By: [name], [name]    . Diverticulosis 06/20/2017  . Hemorrhoids, external 06/20/2017  . Lumbar spondylosis 06/20/2017   Fairly significant L4-L5 facet arthropathy with effusion.  Can consider facet injections and potentially RFA down the road.  Marland Kitchen NEUROPATHY 03/01/2008   Qualifier: Diagnosis of  By: [name], [name]    . Paroxysmal atrial fibrillation (HCC)   . Pulmonary insufficiency 03/09/2018   moderate PI noted on echo 03/09/18  . Pulmonary vein stenosis    left inferior pulmonary vein  stenosis  . Right inguinal hernia 06/17/2014  . Sinus bradycardia   . Strain of gluteus medius 01/29/2017  . Stroke ([place]) 05/2020  . Vertigo     Past Surgical History:  Procedure Laterality Date  . ANKLE ARTHROSCOPY    . ATRIAL FIBRILLATION ABLATION N/A 05/25/2018   Procedure: ATRIAL FIBRILLATION ABLATION;  Surgeon: Thompson Grayer, MD;  Location: [facility];  Service: Cardiovascular;  Laterality: N/A;  . KNEE ARTHROSCOPY      There were no vitals filed for this visit.   Subjective Assessment - 08/27/20 1329    Subjective  Pt inquiring about when finishing with OT says he is doing really well and doesn't feel like he has many deficits anymore.    Pertinent History CVA (Multiple infarcts) 06/22/20 with diplopia. PMH: BPPV, paroxysmal atrial fibrillation s/p ablation and pulmonary vein stenosis, hx of concussion (2012).    Patient Stated Goals Getting back to riding the bike and running and driving. Double Vision.    Currently in Pain? No/denies                        OT Treatments/Exercises (OP) - 08/27/20 1338      Visual/Perceptual Exercises   Scanning Environmental    Scanning - Environmental 100% accuracy  OT Short Term Goals - 08/27/20 1319      OT SHORT TERM GOAL #1   Title Pt will be independent with diplopia HEP 08/13/20    Time 4    Period Weeks    Status Achieved    Target Date 08/13/20      OT SHORT TERM GOAL #2   Title Pt will read large print page of text with partial occlusion PRN with no reports of diplopia    Time 4    Period Weeks    Status Achieved   no reports of diplopia and no occlusion 10/7     OT SHORT TERM GOAL #3   Title Pt will complete table top scanning with 75 % accuracy with partial occlusion PRN with no reports of diplopia    Time 4    Period Weeks    Status Achieved   99% accuracy     OT SHORT TERM GOAL #4   Title Pt will perform environmental scanning with 75% accuracy in a minimlly  distracting environment with minimal reports of diplopia with partial occlusion PRN    Time 4    Period Weeks    Status Achieved   100%     OT SHORT TERM GOAL #5   Title Pt will demonstrate reading of a bill (electric, cable, etc) with minimal errors and no reports of diplopia with partial occlusion PRN to increase independence with financial management.    Time 4    Period Weeks    Status Achieved   no reports of diplopia except really small print 10/7     OT SHORT TERM GOAL #6   Title Pt will prepare a simple warm prep meal with modified independence while locating all items and with good safety in order to increase independence with home management.    Time 4    Period Weeks    Status Achieved             OT Long Term Goals - 08/27/20 1320      OT LONG TERM GOAL #1   Title Pt will be independent with updated diplopia HEP 08/27/2020    Time 6    Period Weeks    Status Achieved      OT LONG TERM GOAL #2   Title Pt will read 12pt or smaller print page of text with partial occlusion PRN with no reports of diplopia, revised Pt will report ability to read small print text for 30 mins without reports of diplopia    Time 6    Period Weeks    Status Achieved   original goal met 08/08/20     OT LONG TERM GOAL #3   Title Pt will complete table top scanning with 90 % accuracy with partial occlusion PRN with no reports of diplopia    Time 6    Period Weeks    Status Achieved   99%     OT LONG TERM GOAL #4   Title Pt will perform environmental scanning with 90% accuracy in a minimlly distracting environment with minimal reports of diplopia with partial occlusion PRN    Time 6    Period Weeks    Status Achieved      OT LONG TERM GOAL #5   Title Pt will demonstrate reading of a bill (electric, cable, etc) with no errors and no reports of diplopia with partial occlusion PRN to increase independence with financial management.    Time 6  Period Weeks    Status Achieved      OT  LONG TERM GOAL #6   Title Patient will verbalize understanding of compensatory strategies for visual deficits.    Time 6    Period Weeks    Status Achieved                 Plan - 08/27/20 1333    Clinical Impression Statement Pt has met all of his STGs and LTGs and is ready for discharge. Pt reports no difficulty with any deficits and much improved double vision. This is evident by his meeting his goals and performance on tasks.    OT Occupational Profile and History Detailed Assessment- Review of Records and additional review of physical, cognitive, psychosocial history related to current functional performance    Occupational performance deficits (Please refer to evaluation for details): Leisure;IADL's;ADL's    Body Structure / Function / Physical Skills ADL;Coordination;UE functional use;Vestibular;Vision;Strength;IADL;Decreased knowledge of use of DME;Decreased knowledge of precautions;ROM;FMC;Dexterity;Balance;Endurance;GMC    Cognitive Skills Attention;Safety Awareness    Rehab Potential Good    Clinical Decision Making Limited treatment options, no task modification necessary    Comorbidities Affecting Occupational Performance: May have comorbidities impacting occupational performance    Modification or Assistance to Complete Evaluation  No modification of tasks or assist necessary to complete eval    OT Frequency 2x / week    OT Duration 8 weeks    OT Treatment/Interventions Self-care/ADL training;Visual/perceptual remediation/compensation;Therapeutic activities;Balance training;Manual Therapy;DME and/or AE instruction    Plan discharge    Consulted and Agree with Plan of Care Patient           OCCUPATIONAL THERAPY DISCHARGE SUMMARY  Visits from Start of Care: 6  Current functional level related to goals / functional outcomes: independent   Remaining deficits: Slight diplopia in minimal space in periphery    Education / Equipment: HEP for diplopia   Plan: Patient agrees to discharge.  Patient goals were met. Patient is being discharged due to meeting the stated rehab goals.  ?????        Patient will benefit from skilled therapeutic intervention in order to improve the following deficits and impairments:   Body Structure / Function / Physical Skills: ADL, Coordination, UE functional use, Vestibular, Vision, Strength, IADL, Decreased knowledge of use of DME, Decreased knowledge of precautions, ROM, FMC, Dexterity, Balance, Endurance, GMC Cognitive Skills: Attention, Safety Awareness     Visit Diagnosis: Other abnormalities of gait and mobility  Visuospatial deficit  Muscle weakness (generalized)  Hemiplegia and hemiparesis following cerebral infarction affecting right dominant side (HCC)  Unsteadiness on feet  Other lack of coordination    Problem List Patient Active Problem List   Diagnosis Date Noted  . Stroke ([place]) 06/22/2020  . Arthralgia 07/14/2019  . Pulmonary vein stenosis s/p stenting 2020 01/02/2019  . Chronic neck pain 08/23/2018  . Tinnitus 08/23/2018  . Lipoma 08/23/2018  . Paroxysmal atrial fibrillation ([place]) 05/25/2018  . Diverticulosis 06/20/2017  . Hemorrhoids, external 06/20/2017  . Benign prostatic hyperplasia without urinary obstruction 06/17/2014  . BPPV (benign paroxysmal positional vertigo) 07/01/2011  . DEGENERATIVE JOINT DISEASE, HANDS 10/16/2009  . NEUROPATHY 03/01/2008    Zachery Conch MOT, OTR/L  08/27/2020, 1:38 PM  [facility] [address] [place], [place], [address] Phone: [phone]   Fax:  [phone]  Name: William Underwood MRN: [id number] Date of Birth: [birth year]

## 2020-08-28 ENCOUNTER — Ambulatory Visit: Payer: PPO | Admitting: Neurology

## 2020-08-29 ENCOUNTER — Encounter: Payer: PPO | Admitting: Occupational Therapy

## 2020-08-29 ENCOUNTER — Ambulatory Visit: Payer: PPO | Admitting: Physical Therapy

## 2020-08-30 ENCOUNTER — Other Ambulatory Visit: Payer: Self-pay

## 2020-08-30 ENCOUNTER — Ambulatory Visit (INDEPENDENT_AMBULATORY_CARE_PROVIDER_SITE_OTHER): Payer: PPO | Admitting: Family Medicine

## 2020-08-30 ENCOUNTER — Encounter: Payer: Self-pay | Admitting: Family Medicine

## 2020-08-30 VITALS — BP 124/77 | HR 54 | Temp 97.6°F | Ht 66.0 in | Wt 153.6 lb

## 2020-08-30 DIAGNOSIS — Z0000 Encounter for general adult medical examination without abnormal findings: Secondary | ICD-10-CM | POA: Diagnosis not present

## 2020-08-30 DIAGNOSIS — I639 Cerebral infarction, unspecified: Secondary | ICD-10-CM

## 2020-08-30 DIAGNOSIS — M25812 Other specified joint disorders, left shoulder: Secondary | ICD-10-CM | POA: Diagnosis not present

## 2020-08-30 DIAGNOSIS — N400 Enlarged prostate without lower urinary tract symptoms: Secondary | ICD-10-CM

## 2020-08-30 DIAGNOSIS — I480 Paroxysmal atrial fibrillation: Secondary | ICD-10-CM

## 2020-08-30 DIAGNOSIS — Z0001 Encounter for general adult medical examination with abnormal findings: Secondary | ICD-10-CM | POA: Diagnosis not present

## 2020-08-30 DIAGNOSIS — H9319 Tinnitus, unspecified ear: Secondary | ICD-10-CM

## 2020-08-30 DIAGNOSIS — Z125 Encounter for screening for malignant neoplasm of prostate: Secondary | ICD-10-CM

## 2020-08-30 NOTE — Progress Notes (Signed)
Chief Complaint:  William Underwood is a 69 y.o. male who presents today for his annual comprehensive physical exam and subsequent medicare annual wellness visit.    Assessment/Plan:  New/Acute Problems: Enlarged [place] Joint Reassured patient.  Likely osteoarthritis or benign enlargement.  Will check ultrasound for reassurance.  Right Knee Pain Has underlying osteoarthritis.  Likely flared up after fall from recent stroke.  No obvious ligamentous injuries on exam today.  Given that symptoms are improving we will continue with watchful waiting.  He will follow-up with his orthopedist if symptoms do not continue to improve.  Chronic Problems Addressed Today: Stroke [facility]) Has had significant recovery over the last couple of months.  Still has a small amount of balance issues and very mild double vision.  He is on Lipitor and Eliquis and doing well.  Will check lipids today.  Paroxysmal atrial fibrillation (HCC) On Eliquis.  Follows with cardiology.  Benign prostatic hyperplasia without urinary obstruction No reported LUTS.  Will check PSA.  Tinnitus Stable.  Continue Valium as needed for sleep.  Preventative Healthcare: Check PSA.  Has received flu and Covid vaccine.  Due for next colonoscopy in 2027.  Patient Counseling(The following topics were reviewed and/or handout was given):  -Nutrition: Stressed importance of moderation in sodium/caffeine intake, saturated fat and cholesterol, caloric balance, sufficient intake of fresh fruits, vegetables, and fiber.  -Stressed the importance of regular exercise.   -Substance Abuse: Discussed cessation/primary prevention of tobacco, alcohol, or other drug use; driving or other dangerous activities under the influence; availability of treatment for abuse.   -Injury prevention: Discussed safety belts, safety helmets, smoke detector, smoking near bedding or upholstery.   -Sexuality: Discussed sexually transmitted diseases, partner selection, use of  condoms, avoidance of unintended pregnancy and contraceptive alternatives.   -Dental health: Discussed importance of regular tooth brushing, flossing, and dental visits.  -Health maintenance and immunizations reviewed. Please refer to Health maintenance section.  During the course of the visit the patient was educated and counseled about appropriate screening and preventive services including:        Fall prevention   Nutrition Physical Activity Weight Management Cognition  Return to care in 1 year for next preventative visit.     Subjective:  HPI:  Health Risk Assessment: Patient considers his overall health to be good. He has no difficulty performing the following: . Preparing food and eating . Bathing  . Getting dressed . Using the toilet . Shopping . Managing Finances . Moving around from place to place  He had a fall during his stroke this past year but none since.   Depression screen PHQ 2/9 08/30/2020  Decreased Interest 2  Down, Depressed, Hopeless 2  PHQ - 2 Score 4  Altered sleeping 1  Tired, decreased energy 1  Change in appetite 0  Feeling bad or failure about yourself  1  Trouble concentrating 1  Moving slowly or fidgety/restless 0  Suicidal thoughts 0  PHQ-9 Score 8  Difficult doing work/chores Not difficult at all  Some recent data might be hidden   Lifestyle Factors: Diet: Balanced.  Exercise: Limited due to recent stroke. Trying to get back into cycling more.   Patient Care Team: [name], MD as PCP - Underwood (Family Medicine) Thompson Grayer, MD as Consulting Physician (Cardiology) Garvin Fila, MD as Consulting Physician (Neurology)   ROS: Per HPI, otherwise a complete review of systems was negative.   PMH:  The following were reviewed and entered/updated in epic: Past  Medical History:  Diagnosis Date  . Benign prostatic hyperplasia without urinary obstruction 06/17/2014  . BPPV (benign paroxysmal positional vertigo) 07/01/2011  .  Chronic hyperkalemia 05/20/2012   Overview:  Hx of for years  . Concussion syndrome 05/15/2011   This seems like a moderately severe concussion with at least 1 minute loss of consciousness in 15-20 minutes of significant confusion including being unable to state his name   . DEGENERATIVE JOINT DISEASE, HANDS 10/16/2009   Qualifier: Diagnosis of  By: [name], [name]    . Diverticulosis 06/20/2017  . Hemorrhoids, external 06/20/2017  . Lumbar spondylosis 06/20/2017   Fairly significant L4-L5 facet arthropathy with effusion.  Can consider facet injections and potentially RFA down the road.  Marland Kitchen NEUROPATHY 03/01/2008   Qualifier: Diagnosis of  By: [name], [name]    . Paroxysmal atrial fibrillation (HCC)   . Pulmonary insufficiency 03/09/2018   moderate PI noted on echo 03/09/18  . Pulmonary vein stenosis    left inferior pulmonary vein stenosis  . Right inguinal hernia 06/17/2014  . Sinus bradycardia   . Strain of gluteus medius 01/29/2017  . Stroke ([place]) 05/2020  . Vertigo    Patient Active Problem List   Diagnosis Date Noted  . Stroke ([place]) 06/22/2020  . Arthralgia 07/14/2019  . Pulmonary vein stenosis s/p stenting 2020 01/02/2019  . Chronic neck pain 08/23/2018  . Tinnitus 08/23/2018  . Lipoma 08/23/2018  . Paroxysmal atrial fibrillation ([place]) 05/25/2018  . Diverticulosis 06/20/2017  . Hemorrhoids, external 06/20/2017  . Benign prostatic hyperplasia without urinary obstruction 06/17/2014  . BPPV (benign paroxysmal positional vertigo) 07/01/2011  . DEGENERATIVE JOINT DISEASE, HANDS 10/16/2009  . NEUROPATHY 03/01/2008   Past Surgical History:  Procedure Laterality Date  . ANKLE ARTHROSCOPY    . ATRIAL FIBRILLATION ABLATION N/A 05/25/2018   Procedure: ATRIAL FIBRILLATION ABLATION;  Surgeon: Thompson Grayer, MD;  Location: [facility];  Service: Cardiovascular;  Laterality: N/A;  . KNEE ARTHROSCOPY      Family History  Problem Relation Age of Onset  . Hearing loss Father   .  Colon cancer Neg Hx     Medications- reviewed and updated Current Outpatient Medications  Medication Sig Dispense Refill  . apixaban (ELIQUIS) 5 MG TABS tablet Take 1 tablet (5 mg total) by mouth 2 (two) times daily. 60 tablet 2  . atorvastatin (LIPITOR) 80 MG tablet Take 1 tablet (80 mg total) by mouth daily. 30 tablet 2  . diazepam (VALIUM) 5 MG tablet Take 1 tablet (5 mg total) by mouth daily as needed for anxiety. 30 tablet 1  . Multiple Vitamins-Minerals (MULTIVITAMIN WITH MINERALS) tablet Take 1 tablet by mouth daily.     . traMADol (ULTRAM) 50 MG tablet TAKE 1 TABLET (50 MG TOTAL) BY MOUTH EVERY 6 (SIX) HOURS AS NEEDED FOR MODERATE PAIN. 30 tablet 5  . Vitamin D, Ergocalciferol, 2000 units CAPS Take 2,000 Units by mouth daily.      No current facility-administered medications for this visit.    Allergies-reviewed and updated Allergies  Allergen Reactions  . Cefdinir Palpitations and Rash  . Codeine Other (See Comments)    Sensitive to this = agitation    Social History   Socioeconomic History  . Marital status: Married    Spouse name: Not on file  . Number of children: Not on file  . Years of education: Not on file  . Highest education level: Not on file  Occupational History  . Not on file  Tobacco  Use  . Smoking status: Never Smoker  . Smokeless tobacco: Never Used  Vaping Use  . Vaping Use: Never used  Substance and Sexual Activity  . Alcohol use: No  . Drug use: No  . Sexual activity: Not on file  Other Topics Concern  . Not on file  Social History Narrative  . Not on file   Social Determinants of Health   Financial Resource Strain:   . Difficulty of Paying Living Expenses: Not on file  Food Insecurity:   . Worried About Charity fundraiser in the Last Year: Not on file  . Ran Out of Food in the Last Year: Not on file  Transportation Needs:   . Lack of Transportation (Medical): Not on file  . Lack of Transportation (Non-Medical): Not on file    Physical Activity:   . Days of Exercise per Week: Not on file  . Minutes of Exercise per Session: Not on file  Stress:   . Feeling of Stress : Not on file  Social Connections:   . Frequency of Communication with Friends and Family: Not on file  . Frequency of Social Gatherings with Friends and Family: Not on file  . Attends Religious Services: Not on file  . Active Member of Clubs or Organizations: Not on file  . Attends Archivist Meetings: Not on file  . Marital Status: Not on file        Objective:  Physical Exam: BP 124/77   Pulse (!) 54   Temp 97.6 F (36.4 C) (Temporal)   Ht 5\' 6"  (1.676 m)   Wt 153 lb 9.6 oz (69.7 kg)   SpO2 98%   BMI 24.79 kg/m   Body mass index is 24.79 kg/m. Wt Readings from Last 3 Encounters:  08/30/20 153 lb 9.6 oz (69.7 kg)  08/24/20 156 lb (70.8 kg)  07/04/20 152 lb (68.9 kg)   Gen: NAD, resting comfortably HEENT: TMs normal bilaterally. OP clear. No thyromegaly noted.  CV: RRR with no murmurs appreciated Pulm: NWOB, CTAB with no crackles, wheezes, or rhonchi GI: Normal bowel sounds present. Soft, Nontender, Nondistended. MSK: no edema, cyanosis, or clubbing noted. Prominent left [place] joint. - Right knee:No deformities.  Stable to varus and valgus stress.  Anterior posterior drawer signs negative.  Crepitus with active range of motion. Skin: warm, dry Neuro: CN2-12 grossly intact. Strength 5/5 in upper and lower extremities. Reflexes symmetric and intact bilaterally. Normal minicog with 3/3 delayed word recall.  Psych: Normal affect and thought content     Jamall Strohmeier M. [name], MD 08/30/2020 10:06 AM

## 2020-08-30 NOTE — Assessment & Plan Note (Signed)
No reported LUTS.  Will check PSA.

## 2020-08-30 NOTE — Assessment & Plan Note (Signed)
Has had significant recovery over the last couple of months.  Still has a small amount of balance issues and very mild double vision.  He is on Lipitor and Eliquis and doing well.  Will check lipids today.

## 2020-08-30 NOTE — Patient Instructions (Signed)
It was very nice to see you today!  You have an enlarged sternoclavicular joint.  We will check an ultrasound make sure there is nothing else is going on.  We will check blood work today.  I will see back in year.  Please come back to see me sooner if needed.  Take care, Dr [name]  Please try these tips to maintain a healthy lifestyle:   Eat at least 3 REAL meals and 1-2 snacks per day.  Aim for no more than 5 hours between eating.  If you eat breakfast, please do so within one hour of getting up.    Each meal should contain half fruits/vegetables, one quarter protein, and one quarter carbs (no bigger than a computer mouse)   Cut down on sweet beverages. This includes juice, soda, and sweet tea.     Drink at least 1 glass of water with each meal and aim for at least 8 glasses per day   Exercise at least 150 minutes every week.    Preventive Care 72 Years and Older, Male Preventive care refers to lifestyle choices and visits with your health care provider that can promote health and wellness. This includes:  A yearly physical exam. This is also called an annual well check.  Regular dental and eye exams.  Immunizations.  Screening for certain conditions.  Healthy lifestyle choices, such as diet and exercise. What can I expect for my preventive care visit? Physical exam Your health care provider will check:  Height and weight. These may be used to calculate body mass index (BMI), which is a measurement that tells if you are at a healthy weight.  Heart rate and blood pressure.  Your skin for abnormal spots. Counseling Your health care provider may ask you questions about:  Alcohol, tobacco, and drug use.  Emotional well-being.  Home and relationship well-being.  Sexual activity.  Eating habits.  History of falls.  Memory and ability to understand (cognition).  Work and work Statistician. What immunizations do I need?  Influenza (flu) vaccine  This  is recommended every year. Tetanus, diphtheria, and pertussis (Tdap) vaccine  You may need a Td booster every 10 years. Varicella (chickenpox) vaccine  You may need this vaccine if you have not already been vaccinated. Zoster (shingles) vaccine  You may need this after age 77. Pneumococcal conjugate (PCV13) vaccine  One dose is recommended after age 77. Pneumococcal polysaccharide (PPSV23) vaccine  One dose is recommended after age 61. Measles, mumps, and rubella (MMR) vaccine  You may need at least one dose of MMR if you were born in 1957 or later. You may also need a second dose. Meningococcal conjugate (MenACWY) vaccine  You may need this if you have certain conditions. Hepatitis A vaccine  You may need this if you have certain conditions or if you travel or work in places where you may be exposed to hepatitis A. Hepatitis B vaccine  You may need this if you have certain conditions or if you travel or work in places where you may be exposed to hepatitis B. Haemophilus influenzae type b (Hib) vaccine  You may need this if you have certain conditions. You may receive vaccines as individual doses or as more than one vaccine together in one shot (combination vaccines). Talk with your health care provider about the risks and benefits of combination vaccines. What tests do I need? Blood tests  Lipid and cholesterol levels. These may be checked every 5 years, or more frequently depending  on your overall health.  Hepatitis C test.  Hepatitis B test. Screening  Lung cancer screening. You may have this screening every year starting at age 55 if you have a 30-pack-year history of smoking and currently smoke or have quit within the past 15 years.  Colorectal cancer screening. All adults should have this screening starting at age 50 and continuing until age 75. Your health care provider may recommend screening at age 45 if you are at increased risk. You will have tests every 1-10  years, depending on your results and the type of screening test.  Prostate cancer screening. Recommendations will vary depending on your family history and other risks.  Diabetes screening. This is done by checking your blood sugar (glucose) after you have not eaten for a while (fasting). You may have this done every 1-3 years.  Abdominal aortic aneurysm (AAA) screening. You may need this if you are a current or former smoker.  Sexually transmitted disease (STD) testing. Follow these instructions at home: Eating and drinking  Eat a diet that includes fresh fruits and vegetables, whole grains, lean protein, and low-fat dairy products. Limit your intake of foods with high amounts of sugar, saturated fats, and salt.  Take vitamin and mineral supplements as recommended by your health care provider.  Do not drink alcohol if your health care provider tells you not to drink.  If you drink alcohol: ? Limit how much you have to 0-2 drinks a day. ? Be aware of how much alcohol is in your drink. In the [place] one drink equals one 12 oz bottle of beer (355 mL), one 5 oz glass of wine (148 mL), or one 1 oz glass of hard liquor (44 mL). Lifestyle  Take daily care of your teeth and gums.  Stay active. Exercise for at least 30 minutes on 5 or more days each week.  Do not use any products that contain nicotine or tobacco, such as cigarettes, e-cigarettes, and chewing tobacco. If you need help quitting, ask your health care provider.  If you are sexually active, practice safe sex. Use a condom or other form of protection to prevent STIs (sexually transmitted infections).  Talk with your health care provider about taking a low-dose aspirin or statin. What's next?  Visit your health care provider once a year for a well check visit.  Ask your health care provider how often you should have your eyes and teeth checked.  Stay up to date on all vaccines. This information is not intended to replace  advice given to you by your health care provider. Make sure you discuss any questions you have with your health care provider. Document Revised: 10/07/2018 Document Reviewed: 10/07/2018 Elsevier Patient Education  2020 Elsevier Inc.  

## 2020-08-30 NOTE — Assessment & Plan Note (Signed)
On Eliquis.  Follows with cardiology.

## 2020-08-30 NOTE — Assessment & Plan Note (Signed)
Stable.  Continue Valium as needed for sleep.

## 2020-08-31 ENCOUNTER — Encounter: Payer: Self-pay | Admitting: Family Medicine

## 2020-08-31 LAB — LIPID PANEL
Cholesterol: 117 mg/dL (ref <200)
HDL: 54 mg/dL (ref >=40)
LDL Cholesterol (Calc): 49 mg/dL (calc)
Non-HDL Cholesterol (Calc): 63 mg/dL (calc) (ref <130)
Total CHOL/HDL Ratio: 2.2 (calc) (ref <5.0)
Triglycerides: 56 mg/dL (ref <150)

## 2020-08-31 LAB — PSA: PSA: 1.57 ng/mL (ref <=4.0)

## 2020-08-31 NOTE — Telephone Encounter (Signed)
Date of birth verified by patient  Lab results given,Pt verbalized understanding    Good news! Cholesterol and PSa look great. Your cholesterol level is not too low but we should recheck again in 6-12 months.

## 2020-08-31 NOTE — Telephone Encounter (Signed)
Please advise 

## 2020-08-31 NOTE — Progress Notes (Signed)
Dr Marigene Ehlers interpretation of your lab work:  Good news! Cholesterol and PSa look great. Your cholesterol level is not too low but we should recheck again in 6-12 months.   If you have any additional questions, please give [place] a call or send [place] a message through [employer].  Take care, Dr [name]

## 2020-09-03 ENCOUNTER — Ambulatory Visit: Payer: PPO

## 2020-09-03 ENCOUNTER — Ambulatory Visit: Payer: PPO | Admitting: Occupational Therapy

## 2020-09-05 ENCOUNTER — Ambulatory Visit: Payer: PPO | Admitting: Physical Therapy

## 2020-09-05 ENCOUNTER — Encounter: Payer: PPO | Admitting: Occupational Therapy

## 2020-09-06 NOTE — Telephone Encounter (Signed)
Please advise 

## 2020-09-10 ENCOUNTER — Encounter: Payer: PPO | Admitting: Occupational Therapy

## 2020-09-10 ENCOUNTER — Ambulatory Visit: Payer: PPO

## 2020-09-12 ENCOUNTER — Ambulatory Visit
Admission: RE | Admit: 2020-09-12 | Discharge: 2020-09-12 | Disposition: A | Discharge status: Home / Self Care | Payer: PPO | Source: Ambulatory Visit | Attending: Family Medicine | Admitting: Family Medicine

## 2020-09-12 ENCOUNTER — Encounter: Payer: PPO | Admitting: Occupational Therapy

## 2020-09-12 ENCOUNTER — Ambulatory Visit: Payer: PPO | Admitting: Physical Therapy

## 2020-09-12 DIAGNOSIS — R221 Localized swelling, mass and lump, neck: Secondary | ICD-10-CM | POA: Diagnosis not present

## 2020-09-12 DIAGNOSIS — M25812 Other specified joint disorders, left shoulder: Secondary | ICD-10-CM

## 2020-09-13 ENCOUNTER — Encounter: Payer: Self-pay | Admitting: *Deleted

## 2020-09-13 ENCOUNTER — Other Ambulatory Visit: Payer: Self-pay

## 2020-09-13 ENCOUNTER — Other Ambulatory Visit: Payer: Self-pay | Admitting: Physician Assistant

## 2020-09-13 DIAGNOSIS — M25812 Other specified joint disorders, left shoulder: Secondary | ICD-10-CM

## 2020-09-13 DIAGNOSIS — I480 Paroxysmal atrial fibrillation: Secondary | ICD-10-CM

## 2020-09-13 DIAGNOSIS — E785 Hyperlipidemia, unspecified: Secondary | ICD-10-CM

## 2020-09-13 MED ORDER — ATORVASTATIN CALCIUM 80 MG PO TABS: 80.0000 mg | ORAL_TABLET | Freq: Every day | ORAL | 3 refills | Status: DC

## 2020-09-13 MED ORDER — APIXABAN 5 MG PO TABS: 5.0000 mg | ORAL_TABLET | Freq: Two times a day (BID) | ORAL | 3 refills | Status: DC

## 2020-09-13 NOTE — Progress Notes (Signed)
Please inform patient of the following:  Ultrasound is normal. Do not need to do any further testing from my standpoint but referral to orthopedics is pending.  [name]. [name], MD 09/13/2020 3:41 PM

## 2020-09-13 NOTE — Progress Notes (Signed)
[facility] - 09/13/20 1600      Assessment    Assessment type Other   No answer, left voicemail

## 2020-09-13 NOTE — Progress Notes (Signed)
[facility] - 09/13/20 1619      Assessment    Assessment type Phone to patient   Pt returned call at [facility] type --   Patient returned phone call   Is patient still in hospital? No    Date of hospital discharge after thrombolysis? 06/23/20      Final 90-Day Modified Rankin Score   Final 90-Day Modified Rankin Score: ([facility]) 2-Unable to carry out all previous activities, but able to look after own affairs and walk without assistance of another person (could live alone without assistance)   Patient unable to ride bicycle at this time     [id number]   Mobility 1- no problems in walking about    Self-care 1- no problems with Self-care    Usual activities 2- slight problems with performing usual activities    Pain/discomfort 1- no pain or discomfort    Anxiety/Depression 2- slight anxious or depressed    What number between 0-100 best describes the patient's health state today (100 means the best health; 0 means the worst health)? [facility] Admission   In the Past 3 months (since your initial hospitalisation for stroke), have you been admitted to hospital (including day-only procedures) for any reason? No      Doctor consultations   In the past 3 months (since your initial hospitalisation for stroke), have you seen any doctors or other health professional (for example physiotherapy, outpatient nurse, general practitioner) for any reason? Yes      a. Doctor consultations   a. Type of service Ophthalmology    a. Condition or purpose Diplopia    a. Date of appointment 06/25/20      b. Doctor consultations   b. Type of service PCP    b. Condition or purpose CVA follow-up    b. Date of appointment 07/04/20      c. Doctor consultations   c. Type of service cardioilogy    c. Condition or purpose Afib    c. Date of appointment 07/04/20      d. Doctor consultation   d. Type of service Neurology    d. Condition or purpose CVA follow-up    d. Date of appointment  07/04/20      e. Doctor consultation   e. Type of service Cardiology    e. Condition or purpose Afib    e. Date of appointment 08/24/20      f. Doctor consultation   f. Type of service OCO    f. Condition or purpose Gen Exam    f. Date of appointment 08/30/20

## 2020-09-14 ENCOUNTER — Other Ambulatory Visit: Payer: Self-pay

## 2020-09-14 MED ORDER — APIXABAN 5 MG PO TABS: 5.0000 mg | ORAL_TABLET | Freq: Two times a day (BID) | ORAL | 3 refills | Status: DC

## 2020-09-14 NOTE — Patient Outreach (Signed)
[facility]) Care Management  09/14/2020  William Underwood 1951/03/23 [id number]   Telephone outreach to patient to obtain mRS was successfully completed. MRS= 2. Score obtained by Kathlen Brunswick on 09/13/18. Found in [place] notes.  Thank you, [facility] Care Management Assistant

## 2020-09-17 MED ORDER — APIXABAN 5 MG PO TABS: 5.0000 mg | ORAL_TABLET | Freq: Two times a day (BID) | ORAL | 3 refills | Status: DC

## 2020-09-17 NOTE — Addendum Note (Signed)
Addended by: Waylan Rocher on: 09/17/2020 09:59 AM   Modules accepted: Orders

## 2020-09-18 DIAGNOSIS — M19012 Primary osteoarthritis, left shoulder: Secondary | ICD-10-CM | POA: Diagnosis not present

## 2020-09-25 DIAGNOSIS — M25561 Pain in right knee: Secondary | ICD-10-CM | POA: Diagnosis not present

## 2020-09-28 DIAGNOSIS — M25561 Pain in right knee: Secondary | ICD-10-CM | POA: Diagnosis not present

## 2020-10-02 DIAGNOSIS — M25561 Pain in right knee: Secondary | ICD-10-CM | POA: Diagnosis not present

## 2020-10-03 ENCOUNTER — Telehealth: Payer: Self-pay | Admitting: *Deleted

## 2020-10-03 NOTE — Telephone Encounter (Signed)
LVM to return call   Pt need to schedule appt with Cardiology for Clearance for surgery

## 2020-10-03 NOTE — Telephone Encounter (Signed)
Relayed msg to pt. Pt verbalized understanding 

## 2020-10-08 ENCOUNTER — Encounter: Payer: Self-pay | Admitting: Neurology

## 2020-10-08 ENCOUNTER — Ambulatory Visit: Payer: PPO | Admitting: Neurology

## 2020-10-08 VITALS — BP 131/78 | HR 52 | Ht 67.0 in | Wt 158.4 lb

## 2020-10-08 DIAGNOSIS — I6990 Unspecified sequelae of unspecified cerebrovascular disease: Secondary | ICD-10-CM

## 2020-10-08 NOTE — Progress Notes (Addendum)
[facility] [address] [place]. [place] [address] [phone]       OFFICE FOLLOW-UP NOTE  [name] Date of Birth:  1950-12-20 Medical Record Number:  [id number]   HPI: Mr. [name] is a pleasant 69 year old Caucasian male seen today for initial office follow-up visit following hospital consultation for stroke in August 2021.  History is obtained from the patient, review of electronic medical records and I personally reviewed imaging films in PACS.GOVERNOR MATOS an 69 y.o.malewith a PMHx of BPPV, paroxysmal atrial fibrillation s/p ablation and pulmonary vein stenosiswho presented to the ED early this afternoon of 06/22/2020 with a c/c of sudden onset numbness on his right side while riding his indoor bike. He did report a history of TIA. After he arrived, he stated that his symptoms seemed to be resolving. He had no drift but his grip was weaker on the right. His speech was clear and he was alert and fully oriented.His RN checked on him at 2 PM and he was normal. Subsequently, the EDP went to place an IV and at that time the patient was exhibiting slurred speech, had double vision, and was unsteady onhis feet.A Code Stroke was called and he was brought emergently to CT. CT revealed no acute hemorrhage.  He was not a TPA candidate since he was taking Eliquis.  CTA of brain and neck vessels showed no large vessel stenosis or occlusion.  MRI scan of the brain shows numerous areas of acute infarction in various vascular distributions majority of them in the posterior circulation particularly in the cerebellum and also involving the pons and midbrain in the frontal and parietal cortex and left thalamus.  2D echo showed ejection fraction 6065% without cardiac source of embolism.  LDL cholesterol is elevated 108 mg percent and hemoglobin A1c was 5.5.  Urine drug screen was negative.  Patient was on aspirin no switch to Eliquis..  Patient states he is doing well.  Is  finished physical and occupational therapy and reached all his goals.  He can walk independently.  His diplopia is essentially gone and weakness is also much better.  Balance is mostly improved and he started riding his bike again.  Is tolerating Eliquis well without bleeding and only minor bruising.  He is taking his Lipitor regularly but does complain of some joint aches and pains.  His blood pressures well controlled and today it is 131/78.  He has no new complaints.  He does have chronic right knee pain and has been diagnosed with meniscal tear by orthopedist in plans to undergo surgery and is asking for neurological clearance.  The knee pain does bother him significantly and he plans to have surgery done in January.  He started driving again without any problems.  ROS:   14 system review of systems is positive for speech difficulty, imbalance, weakness, double vision, unsteady gait and all other systems negative  PMH:  Past Medical History:  Diagnosis Date  . Benign prostatic hyperplasia without urinary obstruction 06/17/2014  . BPPV (benign paroxysmal positional vertigo) 07/01/2011  . Chronic hyperkalemia 05/20/2012   Overview:  Hx of for years  . Concussion syndrome 05/15/2011   This seems like a moderately severe concussion with at least 1 minute loss of consciousness in 15-20 minutes of significant confusion including being unable to state his name   . DEGENERATIVE JOINT DISEASE, HANDS 10/16/2009   Qualifier: Diagnosis of  By: [name], [name]    . Diverticulosis 06/20/2017  . Hemorrhoids,  external 06/20/2017  . Lumbar spondylosis 06/20/2017   Fairly significant L4-L5 facet arthropathy with effusion.  Can consider facet injections and potentially RFA down the road.  Marland Kitchen NEUROPATHY 03/01/2008   Qualifier: Diagnosis of  By: [name], [name]    . Paroxysmal atrial fibrillation (HCC)   . Pulmonary insufficiency 03/09/2018   moderate PI noted on echo 03/09/18  . Pulmonary vein stenosis    left  inferior pulmonary vein stenosis  . Right inguinal hernia 06/17/2014  . Sinus bradycardia   . Strain of gluteus medius 01/29/2017  . Stroke ([name]) 05/2020  . Vertigo     Social History:  Social History   Socioeconomic History  . Marital status: Legally Separated    Spouse name: Not on file  . Number of children: Not on file  . Years of education: Not on file  . Highest education level: Not on file  Occupational History  . Occupation: retired  Tobacco Use  . Smoking status: Never Smoker  . Smokeless tobacco: Never Used  Vaping Use  . Vaping Use: Never used  Substance and Sexual Activity  . Alcohol use: No  . Drug use: No  . Sexual activity: Not on file  Other Topics Concern  . Not on file  Social History Narrative   Lives alone   Right Handed   Drinks 3-4 cups caffeine daily   Social Determinants of Health   Financial Resource Strain: Not on file  Food Insecurity: Not on file  Transportation Needs: Not on file  Physical Activity: Not on file  Stress: Not on file  Social Connections: Not on file  Intimate Partner Violence: Not on file    Medications:   Current Outpatient Medications on File Prior to Visit  Medication Sig Dispense Refill  . apixaban (ELIQUIS) 5 MG TABS tablet Take 1 tablet (5 mg total) by mouth 2 (two) times daily. 180 tablet 3  . atorvastatin (LIPITOR) 80 MG tablet Take 1 tablet (80 mg total) by mouth daily. 90 tablet 3  . diazepam (VALIUM) 5 MG tablet Take 1 tablet (5 mg total) by mouth daily as needed for anxiety. 30 tablet 1  . Multiple Vitamins-Minerals (MULTIVITAMIN WITH MINERALS) tablet Take 1 tablet by mouth daily.     . traMADol (ULTRAM) 50 MG tablet TAKE 1 TABLET (50 MG TOTAL) BY MOUTH EVERY 6 (SIX) HOURS AS NEEDED FOR MODERATE PAIN. 30 tablet 5  . Vitamin D, Ergocalciferol, 2000 units CAPS Take 2,000 Units by mouth daily.      No current facility-administered medications on file prior to visit.    Allergies:   Allergies  Allergen  Reactions  . Cefdinir Palpitations and Rash  . Codeine Other (See Comments)    Sensitive to this = agitation    Physical Exam General: well developed, well nourished elderly Caucasian male, seated, in no evident distress Head: head normocephalic and atraumatic.  Neck: supple with no carotid or supraclavicular bruits Cardiovascular: regular rate and rhythm, no murmurs Musculoskeletal: no deformity Skin:  no rash/petichiae Vascular:  Normal pulses all extremities Vitals:   10/08/20 1306  BP: 131/78  Pulse: (!) 52   Neurologic Exam Mental Status: Awake and fully alert. Oriented to place and time. Recent and remote memory intact. Attention span, concentration and fund of knowledge appropriate. Mood and affect appropriate.  Cranial Nerves: Fundoscopic exam reveals sharp disc margins. Pupils equal, briskly reactive to light. Extraocular movements full without nystagmus. Visual fields full to confrontation. Hearing intact. Facial sensation intact. Face, tongue,  palate moves normally and symmetrically.  Motor: Normal bulk and tone. Normal strength in all tested extremity muscles. Sensory.: intact to touch ,pinprick .position and vibratory sensation.  Coordination: Rapid alternating movements normal in all extremities. Finger-to-nose and heel-to-shin performed accurately bilaterally. Gait and Station: Arises from chair without difficulty. Stance is normal. Gait demonstrates normal stride length and balance . Able to heel, toe and tandem walk with mild difficulty.  Reflexes: 1+ and symmetric. Toes downgoing.   NIHSS  0 Modified Rankin  1   ASSESSMENT: 69 year old Caucasian male with past cerebral small embolic infarcts from history of remote atrial fibrillation treated with ablation in the past in August 2021.Marland Kitchen  Vascular risk factors of hypertension hyperlipidemia and atrial fibrillation.  Patient has done remarkably well with near complete recovery.     PLAN: I had a long d/w patient  about his recent strokes, risk for recurrent stroke/TIAs, personally independently reviewed imaging studies and stroke evaluation results and answered questions.Continue Eliquis (apixaban) 5 mg twice daily  for secondary stroke prevention and maintain strict control of hypertension with blood pressure goal below 130/90, diabetes with hemoglobin A1c goal below 6.5% and lipids with LDL cholesterol goal below 70 mg/dL. I also advised the patient to eat a healthy diet with plenty of whole grains, cereals, fruits and vegetables, exercise regularly and maintain ideal body weight .patient is planning to undergo elective right knee meniscal surgery in January and he is neurologically cleared for the same with a small but acceptable periprocedural risk of TIA/stroke while Eliquis is held for 3 days prior to the surgery and resume after surgery when it is safe.  Patient can also drive since he is recovered well from his stroke.  Followup in the future with my nurse practitioner Janett Billow in 6 months or call earlier if necessary Greater than 50% of time during this 25 minute visit was spent on counseling,explanation of diagnosis of strokes and atrial fibrillation and TIAs, planning of further management, discussion with patient and family and coordination of care Antony Contras, MD Note: This document was prepared with digital dictation and possible smart phrase technology. Any transcriptional errors that result from this process are unintentional

## 2020-10-08 NOTE — Patient Instructions (Signed)
I had a long d/w patient about his recent strokes, risk for recurrent stroke/TIAs, personally independently reviewed imaging studies and stroke evaluation results and answered questions.Continue Eliquis (apixaban) 5 mg twice daily  for secondary stroke prevention and maintain strict control of hypertension with blood pressure goal below 130/90, diabetes with hemoglobin A1c goal below 6.5% and lipids with LDL cholesterol goal below 70 mg/dL. I also advised the patient to eat a healthy diet with plenty of whole grains, cereals, fruits and vegetables, exercise regularly and maintain ideal body weight .patient is planning to undergo elective right knee meniscal surgery in January and he is neurologically cleared for the same with a small but acceptable periprocedural risk of TIA/stroke while Eliquis is held for 3 days prior to the surgery and resume after surgery when it is safe.  Patient can also drive since he is recovered well from his stroke.  Followup in the future with my nurse practitioner Janett Billow in 6 months or call earlier if necessary

## 2020-10-23 ENCOUNTER — Telehealth: Payer: Self-pay

## 2020-10-23 NOTE — Telephone Encounter (Signed)
Patient with diagnosis of afib on Eliquis for anticoagulation.    Procedure: right knee scope Date of procedure: TBD  CHA2DS2-VASc Score = 4  This indicates a 4.8% annual risk of stroke. The patient's score is based upon: CHF History: No HTN History: No Diabetes History: No Stroke History: Yes Vascular Disease History: Yes Age Score: 1 Gender Score: 0  CrCl 47mL/min Platelet count 178K  Per office protocol, patient can hold Eliquis for 1 day prior to procedure. Resume as soon as safely possible given recent stroke in August. If > 24 hour hold is required, will need MD clearance.

## 2020-10-23 NOTE — Telephone Encounter (Signed)
   [facility] Pre-operative Risk Assessment    HEARTCARE STAFF: - Please ensure there is not already an duplicate clearance open for this procedure. - Under Visit Info/Reason for Call, type in Other and utilize the format Clearance MM/DD/YY or Clearance TBD. Do not use dashes or single digits. - If request is for dental extraction, please clarify the # of teeth to be extracted.  Request for surgical clearance:  1. What type of surgery is being performed? RIGHT KNEE SCOPE   2. When is this surgery scheduled? TBD   3. What type of clearance is required (medical clearance vs. Pharmacy clearance to hold med vs. Both)? BOTH  4. Are there any medications that need to be held prior to surgery and how long? ELIQUIS   5. Practice name and name of physician performing surgery? [facility] Minna Antis [facility] Basil ATTN: [place]  6. What is the office phone number? [phone]   7.   What is the office fax number? [phone]  8.   Anesthesia type (None, local, MAC, general) ? NONE LISTED-[id number]   Waylan Rocher 10/23/2020, 11:54 AM  _________________________________________________________________   (provider comments below)

## 2020-10-23 NOTE — Telephone Encounter (Signed)
Left VM requesting call back.   Eleri Ruben S Aniqua Briere, NP  

## 2020-10-24 NOTE — Telephone Encounter (Signed)
Left VM requesting call back 10/24/20 at 10:33am.  Alver Sorrow, NP

## 2020-10-29 NOTE — Telephone Encounter (Signed)
   Primary Cardiologist: Parke Poisson, MD  Left a voicemail for patient to call back for ongoing preoperative assessment.  Beatriz Stallion, PA-C 10/29/2020, 9:06 AM

## 2020-11-05 ENCOUNTER — Telehealth: Payer: Self-pay | Admitting: Emergency Medicine

## 2020-11-05 NOTE — Telephone Encounter (Signed)
Pre-Op Risk Assessment for Right Knee Scope was signed by Dr. Leonie Man and faxed to [facility].  OK transmission received.

## 2020-11-05 NOTE — Telephone Encounter (Signed)
   Primary Cardiologist: [name], MD  Chart reviewed as part of pre-operative protocol coverage. Patient was contacted 11/05/2020 in reference to pre-operative risk assessment for pending surgery as outlined below.  William Underwood was last seen on 08/24/20 by Dr. Margaretann Loveless.  Since that day, William Underwood has done well.  He is  A cyclist and runner. He denies history of MI and angina.   Per our clinical pharmacist: Patient with diagnosis of afib on Eliquis for anticoagulation.    Procedure: right knee scope Date of procedure: TBD  CHA2DS2-VASc Score = 4  This indicates a 4.8% annual risk of stroke. The patient's score is based upon: CHF History: No HTN History: No Diabetes History: No Stroke History: Yes Vascular Disease History: Yes Age Score: 1 Gender Score: 0  CrCl 68mL/min Platelet count 178K  Per office protocol, patient can hold Eliquis for 1 day prior to procedure. Resume as soon as safely possible given recent stroke in August. If > 24 hour hold is required, will need MD clearance.  Therefore, based on ACC/AHA guidelines, the patient would be at acceptable risk for the planned procedure without further cardiovascular testing.   The patient was advised that if he develops new symptoms prior to surgery to contact our office to arrange for a follow-up visit, and he verbalized understanding.  I will route this recommendation to the requesting party via Epic fax function and remove from pre-op pool. Please call with questions.  Tami Lin Jasten Guyette, PA 11/05/2020, 11:19 AM

## 2020-11-05 NOTE — Telephone Encounter (Signed)
Patient returning call.

## 2020-12-06 ENCOUNTER — Other Ambulatory Visit: Payer: Self-pay

## 2020-12-06 ENCOUNTER — Ambulatory Visit: Payer: PPO | Admitting: Sports Medicine

## 2020-12-06 ENCOUNTER — Encounter: Payer: Self-pay | Admitting: Sports Medicine

## 2020-12-06 VITALS — BP 114/72 | Ht 67.0 in | Wt 155.0 lb

## 2020-12-06 DIAGNOSIS — S83241D Other tear of medial meniscus, current injury, right knee, subsequent encounter: Secondary | ICD-10-CM

## 2020-12-06 NOTE — Progress Notes (Signed)
Office Visit Note   Patient: William Underwood           Date of Birth: 14-Aug-1951           MRN: [id number] Visit Date: 12/06/2020 Requested by: [name], MD [address] [place],  [place] [address] PCP: [name], MD  Subjective: CC: Follow up Right Medial Meniscus Tear   HPI: 70 year old male presenting to clinic today to follow-up on right knee MRI results.  Patient recently had an MRI performed at [facility], which demonstrated a linear medial meniscus tear.  Patient is an avid runner, and wants to know how he can safely return to his running hobbies.  Currently, he says that he is able to bike many miles daily, but as soon as he starts to run he will experience pain in the medial aspect of his right knee.  Per patient, "if I was not a runner this would not bother me at all."  He says that after runs, he will notice swelling throughout his right knee.  He is not interested in having surgery, as he denies any mechanical symptoms.  He is more interested in conservative care with home exercises as well as a return to run progression, but he is seeking guidance on this.  He initially injured his knee in September 2021, when he had a stroke in his hallway, and as he fell he twisted his knee beneath him.              ROS:   All other systems were reviewed and are negative.  Objective: Vital Signs: BP 114/72   Ht 5\' 7"  (1.702 m)   Wt 155 lb (70.3 kg)   BMI 24.28 kg/m   Physical Exam:  General:  Alert and oriented, in no acute distress. Pulm:  Breathing unlabored. Psy:  Normal mood, congruent affect. Skin: Right knee overlying skin intact, with no bruising, rashes, or erythema. Right knee exam: General: Normal gait Standing exam: No varus or valgus deformity of the knee. No pes planus or pes cavus.   Seated Exam:  Minimal patellar crepitus, Negative J-Sign.   Palpation: No tenderness to palpation over medial or lateral joint lines. No tenderness with palpation of  patella or patellar tendon. No tenderness over patellar facets.   Supine exam: No effusion, normal patellar mobility.   Ligamentous Exam:  No pain or laxity with anterior/posterior drawer.  No obvious Sag.  No pain or laxity with varus/valgus stress across the knee.   Meniscus:  McMurray with no pain or obvious deep clicking.   Strength: Hip flexion (L1), Hip Aduction (L2), Knee Extension (L3) are 5/5 Bilaterally Foot Inversion (L4), Dorsiflexion (L5), and Eversion (S1) 5/5 Bilaterally  Sensation: Intact to light touch medial and lateral aspects of lower extremities, and lateral, dorsal, and medial aspects of foot.    Imaging: Extremity Ultrasound-right knee:   Quadricepts tendon visualized in long and short access, fibers intact without obvious defects or evidence of tears.  Does have a very small effusion appreciated within the suprapatellar pouch. Patellar Tendon intact, with no intratendinous calcifications or thickening.   Lateral joint space preserved. Lateral meniscus appears intact, without significant surrounding fluid.  Overlying lateral collateral ligament fibers appear intact.   Medial joint space appears well preserved.  Medial meniscus with fissuring appreciated in the posterior as well as anterior aspects.  No significant surrounding fluid. Overlying medial collateral ligament appears intact.   Trochlear grove easily visualized with joint space well preserved.  No cortical irregularities.   No baker's cyst appreciated in popliteal fossa.   Impression: Ultrasound consistent with known medial meniscus tear.  Mild effusion.   Assessment & Plan: 70 year old avid runner presenting to clinic today requesting second opinion on medial right meniscus tear.  Reassuringly, patient has no significant effusion today, and he has no significant meniscal symptoms. -Discussed a gradual return of progression with running, starting on a treadmill for extra padding. -Provided with  hapad sports insoles to further cushion the foot. -Discussed rehabilitation exercises he can perform to stabilize the knee. -Anticipate he should be able to return to running if he is not too aggressive with his return. -Return to clinic in 4 to 6 weeks to evaluate progress. -Patient was happy with today's visit, he no further questions or concerns.     Procedures: No procedures performed     I observed and examined the patient with the [facility] resident and agree with assessment and plan.  Note reviewed and modified by me. Ila Mcgill, MD

## 2021-01-15 DIAGNOSIS — H2513 Age-related nuclear cataract, bilateral: Secondary | ICD-10-CM | POA: Diagnosis not present

## 2021-01-15 DIAGNOSIS — H524 Presbyopia: Secondary | ICD-10-CM | POA: Diagnosis not present

## 2021-01-17 ENCOUNTER — Telehealth: Payer: Self-pay

## 2021-01-17 NOTE — Telephone Encounter (Signed)
Patient has an appointment tomorrow with Alyssa.   Nurse Assessment Nurse: Velta Addison, RN, [place] Date/Time (Eastern Time): 01/17/2021 11:47:16 AM Confirm and document reason for call. If symptomatic, describe symptoms. ---Caller from clinic has pt on the line and he is having severe abdominal pain. Caller states it has been building up for a week now. States he has had some issues in the past, prostatitis. Wants to have prostate checked as well. Unable to get in with urology. No fever, just constant lower abdominal pain. States it is effecting is urinary pattern. States it starts, stops, and dribbles sometimes. Feels like it is getting worse. Does the patient have any new or worsening symptoms? ---Yes Will a triage be completed? ---Yes Related visit to physician within the last 2 weeks? ---No Does the PT have any chronic conditions? (i.e. diabetes, asthma, this includes High risk factors for pregnancy, etc.) ---Yes List chronic conditions. ---elevated BPH Is this a behavioral health or substance abuse call? ---No Guidelines Guideline Title Affirmed Question Affirmed Notes Nurse Date/Time (Eastern Time) Abdominal Pain - Male [1] MODERATE pain (e.g., interferes with normal activities) AND [2] pain comes and goes (cramps) AND [3] present > 24 hours (Exception: pain with Vomiting Parrott, RN, [place] 01/17/2021 11:50:31 AM PLEASE NOTE: All timestamps contained within this report are represented as [place] Standard Time. CONFIDENTIALTY NOTICE: This fax transmission is intended only for the addressee. It contains information that is legally privileged, confidential or otherwise protected from use or disclosure. If you are not the intended recipient, you are strictly prohibited from reviewing, disclosing, copying using or disseminating any of this information or taking any action in reliance on or regarding this information. If you have received this fax in error, please notify us immediately  by telephone so that we can arrange for its return to [place]. Phone: [phone], Toll-Free: [phone], Fax: [phone] Page: 2 of 2 Call Id: [id number] Guidelines Guideline Title Affirmed Question Affirmed Notes Nurse Date/Time Eilene Ghazi Time) or Diarrhea - see that Guideline) Disp. Time Eilene Ghazi Time) Disposition Final User 01/17/2021 11:45:21 AM Send to Urgent Tressia Miners, [place] 01/17/2021 11:57:58 AM See PCP within 24 Hours Yes Parrott, RN, Interior and spatial designer Understands Yes PreDisposition Call Doctor Care Advice Given Per Guideline SEE PCP WITHIN 24 HOURS: CARE ADVICE given per Abdominal Pain, Male (Adult) guideline. * You become worse CALL BACK IF: Referrals REFERRED TO PCP OFFIC

## 2021-01-17 NOTE — Telephone Encounter (Signed)
FYI

## 2021-01-18 ENCOUNTER — Other Ambulatory Visit: Payer: Self-pay

## 2021-01-18 ENCOUNTER — Encounter: Payer: Self-pay | Admitting: Physician Assistant

## 2021-01-18 ENCOUNTER — Ambulatory Visit (INDEPENDENT_AMBULATORY_CARE_PROVIDER_SITE_OTHER): Payer: PPO | Admitting: Physician Assistant

## 2021-01-18 VITALS — BP 118/66 | HR 49 | Temp 97.4°F | Ht 67.0 in | Wt 156.2 lb

## 2021-01-18 DIAGNOSIS — N410 Acute prostatitis: Secondary | ICD-10-CM | POA: Diagnosis not present

## 2021-01-18 LAB — POCT URINALYSIS DIPSTICK
Bilirubin, UA: NEGATIVE
Blood, UA: NEGATIVE
Glucose, UA: NEGATIVE
Ketones, UA: 5
Leukocytes, UA: NEGATIVE
Nitrite, UA: NEGATIVE
Protein, UA: NEGATIVE
Spec Grav, UA: >=1.03 — AB (ref 1.010–1.025)
Urobilinogen, UA: 0.2 E.U./dL
pH, UA: 5 (ref 5.0–8.0)

## 2021-01-18 MED ORDER — TRAMADOL HCL 50 MG PO TABS: 50.0000 mg | ORAL_TABLET | Freq: Four times a day (QID) | ORAL | 5 refills | Status: DC | PRN

## 2021-01-18 MED ORDER — SULFAMETHOXAZOLE-TRIMETHOPRIM 800-160 MG PO TABS: 1.0000 | ORAL_TABLET | Freq: Two times a day (BID) | ORAL | 0 refills | Status: AC

## 2021-01-18 MED ORDER — TAMSULOSIN HCL 0.4 MG PO CAPS: 0.4000 mg | ORAL_CAPSULE | Freq: Every day | ORAL | 3 refills | Status: DC

## 2021-01-18 NOTE — Patient Instructions (Signed)
Please provide urine sample today and I will call or MyChart with results. Take the Bactrim, Flomax, and Tramadol as directed. Recheck right away if severely worse or change in symptoms. Keep f/up with urology in 3 weeks.   Prostatitis  Prostatitis is swelling or inflammation of the prostate gland, also called the prostate. This gland is about 1.5 inches wide and 1 inch high, and it is involved in making semen. The prostate is located below a man's bladder, in front of the rectum. There are four types of prostatitis:  Chronic prostatitis (CP), also called chronic pelvic pain syndrome (CPPS). This is the most common type of prostatitis. It is associated with increased muscle tone in the area between the hip bones (pelvic area), around the prostate. This type is also known as a pelvic floor disorder.  Chronic bacterial prostatitis. This type usually results from an acute bacterial infection in the prostate gland that keeps coming back or has not been treated properly. The symptoms are less severe than those caused by acute bacterial prostatitis, which lasts a shorter time.  Asymptomatic inflammatory prostatitis. This type does not have symptoms and does not need treatment. This is diagnosed when tests are done for other disorders of the urinary tract or reproductive tract.  Acute bacterial prostatitis. This type starts quickly and results from an acute bacterial infection in the prostate gland. It is usually associated with a bladder infection, high fever, and chills. This is the least common type of prostatitis. What are the causes? Bacterial prostatitis is caused by an infection from bacteria. Chronic nonbacterial prostatitis may be caused by:  Factors related to the nervous system. This system includes thebrain, spinal cord, and nerves.  An autoimmune response. This happens when the body's disease-fighting system attacks healthy tissue in the body by mistake.  Psychological factors. These  have to do with how the mind works. The causes of the other types of prostatitis are usually not known. What are the signs or symptoms? Symptoms of this condition depend on the type of prostatitis you have. Acute bacterial prostatitis Symptoms may include:  Pain or burning during urination.  Frequent and sudden urges to urinate.  Trouble starting to urinate.  Fever.  Chills.  Pain in your muscles or joints, lower back, or lower abdomen. Other types of prostatitis Symptoms may include:  Sudden urges to urinate, or urinating often.  Trouble starting to urinate.  Weak urine stream.  Dribbling after urination.  Discharge coming from the penis.  Pain in the testicles, the penis, or the tip of the penis.  Pain in the area in front of the rectum and below the scrotum (perineum).  Pain when ejaculating. How is this diagnosed? This condition may be diagnosed based on:  A physical and medical exam.  A digital rectal exam. For this, the health care provider may use a finger to feel the prostate.  A urine test to check for bacteria.  A semen sample or blood tests.  Ultrasound.  Urodynamic tests to check how your body handles urine.  Cystoscopy to look inside your bladder or inside the part of your body that drains urine from the bladder (urethra). How is this treated? Treatment for this condition depends on the type of prostatitis. Treatment may involve:  Medicines to relieve pain or inflammation, or to help relax your muscles.  Physical therapy.  Heat therapy.  Biofeedback. These techniques help you control certain body functions.  Relaxation exercises.  Antibiotic medicine, if your condition is caused by  bacteria.  Sitz baths. These warm water baths help to relax your pelvic floor muscles, which helps to relieve pressure on the prostate. Follow these instructions at home: Medicines  Take over-the-counter and prescription medicines only as told by your  health care provider.  If you were prescribed an antibiotic medicine, take it as told by your health care provider. Do not stop using the antibiotic even if you start to feel better. Managing pain and swelling  Take sitz baths as directed by your health care provider. For a sitz bath, sit in warm water that is deep enough to cover your hips and buttocks.  If directed, apply heat to the affected area as often as told by your health care provider. Use the heat source that your health care provider recommends, such as a moist heat pack or a heating pad. ? Place a towel between your skin and the heat source. ? Leave the heat on for 20-30 minutes. ? Remove the heat if your skin turns bright red. This is especially important if you are unable to feel pain, heat, or cold. You may have a greater risk of getting burned.   General instructions  Do exercises as told by your health care provider, if you were prescribed physical therapy, biofeedback, or relaxation exercises.  Keep all follow-up visits as told by your health care provider. This is important. Where to find more information  [employer] of Diabetes and Digestive and Kidney Diseases: http://www.bass.com/ Contact a health care provider if:  Your symptoms get worse.  You have a fever. Get help right away if:  You have chills.  You feel light-headed or feel like you may faint.  You cannot urinate.  You have blood or blood clots in your urine. Summary  Prostatitis is swelling or inflammation of the prostate gland.  Treatment for this condition depends on the type of prostatitis.  Take over-the-counter and prescription medicines only as told by your health care provider.  Get help right away of you have chills, feel light-headed, feel like you may faint, cannot urinate, or have blood or blood clots in your urine. This information is not intended to replace advice given to you by your health care provider. Make sure  you discuss any questions you have with your health care provider. Document Revised: 11/18/2019 Document Reviewed: 11/18/2019 Elsevier Patient Education  2021 [employer].

## 2021-01-18 NOTE — Progress Notes (Signed)
Acute Office Visit  Subjective:    Patient ID: William Underwood, male    DOB: [birth year], 70 y.o.   MRN: [id number]  Chief Complaint  Patient presents with  . Benign Prostatic Hypertrophy  . Abdominal Pain    HPI Patient is in today for lower suprapubic pain, low back pain, and dysuria x 1 week. He has hx of prostatitis in 39s and 78s. He has seen urology previously and has been clear for about 12 years until now. Not sleeping well because of the pain. Trouble voiding and dribbling, states he has hx of BPH. He has appt with urology in 3 weeks, but asking for treatment and pain relief now. He has taken Tramadol previously and this did help for pain. Has not taken Flomax for several years now, but willing to take again for relief.  No fever, chills, body aches, weakness, nausea, or vomiting. No hematuria.   Past Medical History:  Diagnosis Date  . Benign prostatic hyperplasia without urinary obstruction 06/17/2014  . BPPV (benign paroxysmal positional vertigo) 07/01/2011  . Chronic hyperkalemia 05/20/2012   Overview:  Hx of for years  . Concussion syndrome 05/15/2011   This seems like a moderately severe concussion with at least 1 minute loss of consciousness in 15-20 minutes of significant confusion including being unable to state his name   . DEGENERATIVE JOINT DISEASE, HANDS 10/16/2009   Qualifier: Diagnosis of  By: [name], [name]    . Diverticulosis 06/20/2017  . Hemorrhoids, external 06/20/2017  . Lumbar spondylosis 06/20/2017   Fairly significant L4-L5 facet arthropathy with effusion.  Can consider facet injections and potentially RFA down the road.  Marland Kitchen NEUROPATHY 03/01/2008   Qualifier: Diagnosis of  By: [name], [name]    . Paroxysmal atrial fibrillation (HCC)   . Pulmonary insufficiency 03/09/2018   moderate PI noted on echo 03/09/18  . Pulmonary vein stenosis    left inferior pulmonary vein stenosis  . Right inguinal hernia 06/17/2014  . Sinus bradycardia   . Strain of gluteus  medius 01/29/2017  . Stroke ([place]) 05/2020  . Vertigo     Past Surgical History:  Procedure Laterality Date  . ANKLE ARTHROSCOPY    . ATRIAL FIBRILLATION ABLATION N/A 05/25/2018   Procedure: ATRIAL FIBRILLATION ABLATION;  Surgeon: Thompson Grayer, MD;  Location: [facility];  Service: Cardiovascular;  Laterality: N/A;  . KNEE ARTHROSCOPY      Family History  Problem Relation Age of Onset  . Hearing loss Father   . Colon cancer Neg Hx     Social History   Socioeconomic History  . Marital status: Legally Separated    Spouse name: Not on file  . Number of children: Not on file  . Years of education: Not on file  . Highest education level: Not on file  Occupational History  . Occupation: retired  Tobacco Use  . Smoking status: Never Smoker  . Smokeless tobacco: Never Used  Vaping Use  . Vaping Use: Never used  Substance and Sexual Activity  . Alcohol use: No  . Drug use: No  . Sexual activity: Not on file  Other Topics Concern  . Not on file  Social History Narrative   Lives alone   Right Handed   Drinks 3-4 cups caffeine daily   Social Determinants of Health   Financial Resource Strain: Not on file  Food Insecurity: Not on file  Transportation Needs: Not on file  Physical Activity: Not on file  Stress: Not  on file  Social Connections: Not on file  Intimate Partner Violence: Not on file    Outpatient Medications Prior to Visit  Medication Sig Dispense Refill  . apixaban (ELIQUIS) 5 MG TABS tablet Take 1 tablet (5 mg total) by mouth 2 (two) times daily. 180 tablet 3  . atorvastatin (LIPITOR) 80 MG tablet Take 1 tablet (80 mg total) by mouth daily. 90 tablet 3  . diazepam (VALIUM) 5 MG tablet Take 1 tablet (5 mg total) by mouth daily as needed for anxiety. 30 tablet 1  . Multiple Vitamins-Minerals (MULTIVITAMIN WITH MINERALS) tablet Take 1 tablet by mouth daily.     . traMADol (ULTRAM) 50 MG tablet TAKE 1 TABLET (50 MG TOTAL) BY MOUTH EVERY 6 (SIX) HOURS AS  NEEDED FOR MODERATE PAIN. 30 tablet 5  . Vitamin D, Ergocalciferol, 2000 units CAPS Take 2,000 Units by mouth daily.      No facility-administered medications prior to visit.    Allergies  Allergen Reactions  . Cefdinir Palpitations and Rash  . Codeine Other (See Comments)    Sensitive to this = agitation    Review of Systems REFER TO HPI FOR PERTINENT POSITIVES AND NEGATIVES     Objective:    Physical Exam Vitals and nursing note reviewed. Exam conducted with a chaperone present.  Constitutional:      Appearance: He is well-developed.  Abdominal:     Tenderness: There is abdominal tenderness in the suprapubic area. There is no right CVA tenderness or left CVA tenderness.  Genitourinary:    Prostate: Enlarged and tender.  Neurological:     Mental Status: He is alert.     BP 118/66   Pulse (!) 49   Temp (!) 97.4 F (36.3 C)   Ht 5\' 7"  (1.702 m)   Wt 156 lb 3.2 oz (70.9 kg)   SpO2 96%   BMI 24.46 kg/m  Wt Readings from Last 3 Encounters:  01/18/21 156 lb 3.2 oz (70.9 kg)  12/06/20 155 lb (70.3 kg)  10/08/20 158 lb 6.4 oz (71.8 kg)     Lab Results  Component Value Date   TSH 1.08 10/06/2018   Lab Results  Component Value Date   WBC 9.8 06/23/2020   HGB 13.5 06/23/2020   HCT 40.7 06/23/2020   MCV 90.6 06/23/2020   PLT 178 06/23/2020   Lab Results  Component Value Date   NA 139 06/23/2020   K 4.1 06/23/2020   CO2 20 (L) 06/23/2020   GLUCOSE 96 06/23/2020   BUN 18 06/23/2020   CREATININE 0.78 06/23/2020   BILITOT 0.7 06/22/2020   ALKPHOS 43 06/22/2020   AST 27 06/22/2020   ALT 21 06/22/2020   PROT 7.3 06/22/2020   ALBUMIN 4.2 06/22/2020   CALCIUM 8.9 06/23/2020   ANIONGAP 12 06/23/2020   GFR 99.32 10/06/2018   Lab Results  Component Value Date   CHOL 117 08/30/2020   Lab Results  Component Value Date   HDL 54 08/30/2020   Lab Results  Component Value Date   LDLCALC 49 08/30/2020   Lab Results  Component Value Date   TRIG 56  08/30/2020   Lab Results  Component Value Date   CHOLHDL 2.2 08/30/2020   Lab Results  Component Value Date   HGBA1C 5.5 06/23/2020       Assessment & Plan:   Problem List Items Addressed This Visit   None     1. Acute prostatitis without hematuria Prostate exam performed in office today.  Findings c/w prostatitis. Will check UA and UC. Start on Bactrim, Flomax, and Tramadol as directed. F/up with urology in 3 weeks. Recheck if worse or no improvement before then.  This visit occurred during the SARS-CoV-2 public health emergency.  Safety protocols were in place, including screening questions prior to the visit, additional usage of staff PPE, and extensive cleaning of exam room while observing appropriate contact time as indicated for disinfecting solutions.    Tyesha Joffe M Delta Deshmukh, PA-C

## 2021-01-19 ENCOUNTER — Emergency Department (HOSPITAL_BASED_OUTPATIENT_CLINIC_OR_DEPARTMENT_OTHER): Payer: PPO

## 2021-01-19 ENCOUNTER — Encounter (HOSPITAL_BASED_OUTPATIENT_CLINIC_OR_DEPARTMENT_OTHER): Payer: Self-pay | Admitting: Emergency Medicine

## 2021-01-19 ENCOUNTER — Other Ambulatory Visit: Payer: Self-pay

## 2021-01-19 ENCOUNTER — Emergency Department (HOSPITAL_BASED_OUTPATIENT_CLINIC_OR_DEPARTMENT_OTHER)
Admission: EM | Admit: 2021-01-19 | Discharge: 2021-01-19 | Disposition: A | Discharge status: Home / Self Care | Payer: PPO | Attending: Emergency Medicine | Admitting: Emergency Medicine

## 2021-01-19 DIAGNOSIS — Z7901 Long term (current) use of anticoagulants: Secondary | ICD-10-CM | POA: Insufficient documentation

## 2021-01-19 DIAGNOSIS — R109 Unspecified abdominal pain: Secondary | ICD-10-CM | POA: Diagnosis not present

## 2021-01-19 DIAGNOSIS — J9811 Atelectasis: Secondary | ICD-10-CM | POA: Diagnosis not present

## 2021-01-19 DIAGNOSIS — R1033 Periumbilical pain: Secondary | ICD-10-CM | POA: Insufficient documentation

## 2021-01-19 DIAGNOSIS — R1030 Lower abdominal pain, unspecified: Secondary | ICD-10-CM

## 2021-01-19 DIAGNOSIS — I480 Paroxysmal atrial fibrillation: Secondary | ICD-10-CM | POA: Diagnosis not present

## 2021-01-19 DIAGNOSIS — R110 Nausea: Secondary | ICD-10-CM | POA: Insufficient documentation

## 2021-01-19 LAB — COMPREHENSIVE METABOLIC PANEL
ALT: 30 U/L (ref 0–44)
AST: 26 U/L (ref 15–41)
Albumin: 4.3 g/dL (ref 3.5–5.0)
Alkaline Phosphatase: 42 U/L (ref 38–126)
Anion gap: 8 (ref 5–15)
BUN: 20 mg/dL (ref 8–23)
CO2: 25 mmol/L (ref 22–32)
Calcium: 9 mg/dL (ref 8.9–10.3)
Chloride: 106 mmol/L (ref 98–111)
Creatinine, Ser: 0.92 mg/dL (ref 0.61–1.24)
GFR, Estimated: >60 mL/min (ref >60)
Glucose, Bld: 88 mg/dL (ref 70–99)
Potassium: 3.8 mmol/L (ref 3.5–5.1)
Sodium: 139 mmol/L (ref 135–145)
Total Bilirubin: 0.6 mg/dL (ref 0.3–1.2)
Total Protein: 6.4 g/dL — ABNORMAL LOW (ref 6.5–8.1)

## 2021-01-19 LAB — URINALYSIS, ROUTINE W REFLEX MICROSCOPIC
Bilirubin Urine: NEGATIVE
Glucose, UA: NEGATIVE mg/dL
Hgb urine dipstick: NEGATIVE
Ketones, ur: NEGATIVE mg/dL
Leukocytes,Ua: NEGATIVE
Nitrite: NEGATIVE
Specific Gravity, Urine: 1.036 — ABNORMAL HIGH (ref 1.005–1.030)
pH: 5.5 (ref 5.0–8.0)

## 2021-01-19 LAB — CBC
HCT: 43.6 % (ref 39.0–52.0)
Hemoglobin: 14.5 g/dL (ref 13.0–17.0)
MCH: 29.7 pg (ref 26.0–34.0)
MCHC: 33.3 g/dL (ref 30.0–36.0)
MCV: 89.3 fL (ref 80.0–100.0)
Platelets: 218 10*3/uL (ref 150–400)
RBC: 4.88 MIL/uL (ref 4.22–5.81)
RDW: 13 % (ref 11.5–15.5)
WBC: 10.1 10*3/uL (ref 4.0–10.5)
nRBC: 0 % (ref 0.0–0.2)

## 2021-01-19 LAB — LIPASE, BLOOD: Lipase: 24 U/L (ref 11–51)

## 2021-01-19 MED ORDER — HYDROMORPHONE HCL 1 MG/ML IJ SOLN
1.0000 mg | Freq: Once | INTRAMUSCULAR | Status: AC
  Administered 2021-01-19: 1 mg via INTRAVENOUS
  Filled 2021-01-19: qty 1

## 2021-01-19 MED ORDER — BELLADONNA ALKALOIDS-OPIUM 16.2-30 MG RE SUPP: 1.0000 | Freq: Two times a day (BID) | RECTAL | 0 refills | Status: DC | PRN

## 2021-01-19 MED ORDER — IOHEXOL 350 MG/ML SOLN
100.0000 mL | Freq: Once | INTRAVENOUS | Status: AC | PRN
  Administered 2021-01-19: 100 mL via INTRAVENOUS

## 2021-01-19 MED ORDER — BELLADONNA ALKALOIDS-OPIUM 16.2-30 MG RE SUPP: 1.0000 | Freq: Once | RECTAL | Status: DC

## 2021-01-19 NOTE — Discharge Instructions (Signed)
Please follow up with the urologist as scheduled in April.  Please avoid cycling or excessive activity this week which may irritate or pinch the nerves in your back or abdomen.    You can take tylenol regularly for pain, and then try either Tramadol OR the bella suppository for pain.  These are both narcotics - do not take them at the same time as this can raise your risk of over-sedation or over dose.  Take them at least 6 hours apart.  We did not find a clear cause of your pain today.  I did not think you had an infection or serious inflammation on your CT scan or workup.  This may be related to nerve pinching or muscle strain.  However, if your pain becomes severe, or you feel lightheaded, or begin having severe nausea or vomiting, do not hesitate to return to the ER.

## 2021-01-19 NOTE — ED Provider Notes (Signed)
[facility] EMERGENC[facility] DEPT Provider Note   CSN: [id number] Arrival date & time: 01/19/21  1444     History Chief Complaint  Patient presents with  . Abdominal Pain    William Underwood is a 70 y.o. male w/ hx of BPPV, A Fib on eliquis, presenting to ED with lower abdominal and umbilical pain x 1 week.  Pt reports onset of pain after a prolonged bike ride of 20 miles (he is a cyclist).  Gradually worsening all week.  He describes it as "a balloon that's slowly expanding inside of me," and states he feels it in his back now.  He reports urinating normally but states he has BPPV and issues with "dribbling."  His PCP's office started him on flomax and bactrim yesterday for possible prostatitis based on a prior hx of this, but he denies dysuria and his urine culture was negative.    Pain is currently 7/10, very uncomfortable.  Never had this before.  Worse with movement but also present at rest.  He's tried tramadol at home but not getting much relief.  Denies CP, lightheadedness, syncope.  Denies constipation or pain with defecation Reports nausea with his pain, but no vomiting Denies fevers, chills this week.  No hx of smoking or family hx of aneurysm  HPI     Past Medical History:  Diagnosis Date  . Benign prostatic hyperplasia without urinary obstruction 06/17/2014  . BPPV (benign paroxysmal positional vertigo) 07/01/2011  . Chronic hyperkalemia 05/20/2012   Overview:  Hx of for years  . Concussion syndrome 05/15/2011   This seems like a moderately severe concussion with at least 1 minute loss of consciousness in 15-20 minutes of significant confusion including being unable to state his name   . DEGENERATIVE JOINT DISEASE, HANDS 10/16/2009   Qualifier: Diagnosis of  By: [name], [name]    . Diverticulosis 06/20/2017  . Hemorrhoids, external 06/20/2017  . Lumbar spondylosis 06/20/2017   Fairly significant L4-L5 facet arthropathy with effusion.  Can consider facet  injections and potentially RFA down the road.  Marland Kitchen NEUROPATHY 03/01/2008   Qualifier: Diagnosis of  By: [name], [name]    . Paroxysmal atrial fibrillation (HCC)   . Pulmonary insufficiency 03/09/2018   moderate PI noted on echo 03/09/18  . Pulmonary vein stenosis    left inferior pulmonary vein stenosis  . Right inguinal hernia 06/17/2014  . Sinus bradycardia   . Strain of gluteus medius 01/29/2017  . Stroke ([place]) 05/2020  . Vertigo     Patient Active Problem List   Diagnosis Date Noted  . Stroke ([place]) 06/22/2020  . Arthralgia 07/14/2019  . Pulmonary vein stenosis s/p stenting 2020 01/02/2019  . Chronic neck pain 08/23/2018  . Tinnitus 08/23/2018  . Lipoma 08/23/2018  . Paroxysmal atrial fibrillation ([place]) 05/25/2018  . Diverticulosis 06/20/2017  . Hemorrhoids, external 06/20/2017  . Benign prostatic hyperplasia without urinary obstruction 06/17/2014  . BPPV (benign paroxysmal positional vertigo) 07/01/2011  . DEGENERATIVE JOINT DISEASE, HANDS 10/16/2009  . NEUROPATHY 03/01/2008    Past Surgical History:  Procedure Laterality Date  . ANKLE ARTHROSCOPY    . ATRIAL FIBRILLATION ABLATION N/A 05/25/2018   Procedure: ATRIAL FIBRILLATION ABLATION;  Surgeon: Thompson Grayer, MD;  Location: [facility];  Service: Cardiovascular;  Laterality: N/A;  . KNEE ARTHROSCOPY         Family History  Problem Relation Age of Onset  . Hearing loss Father   . Colon cancer Neg Hx  Social History   Tobacco Use  . Smoking status: Never Smoker  . Smokeless tobacco: Never Used  Vaping Use  . Vaping Use: Never used  Substance Use Topics  . Alcohol use: No  . Drug use: No    Home Medications Prior to Admission medications   Medication Sig Start Date End Date Taking? Authorizing Provider  belladonna-opium (B&O SUPPRETTES) 16.2-30 MG suppository Place 1 suppository rectally 2 (two) times daily as needed for up to 12 doses for pain. 01/19/21  Yes Ireoluwa Gorsline, Carola Rhine, MD  apixaban  (ELIQUIS) 5 MG TABS tablet Take 1 tablet (5 mg total) by mouth 2 (two) times daily. 09/17/20   Duke, Tami Lin, PA  atorvastatin (LIPITOR) 80 MG tablet Take 1 tablet (80 mg total) by mouth daily. 09/13/20   Duke, Tami Lin, PA  diazepam (VALIUM) 5 MG tablet Take 1 tablet (5 mg total) by mouth daily as needed for anxiety. 08/23/18   [name], MD  Multiple Vitamins-Minerals (MULTIVITAMIN WITH MINERALS) tablet Take 1 tablet by mouth daily.     [provider]  sulfamethoxazole-trimethoprim (BACTRIM DS) 800-160 MG tablet Take 1 tablet by mouth 2 (two) times daily for 28 days. 01/18/21 02/15/21  Allwardt, Randa Evens, PA-C  tamsulosin (FLOMAX) 0.4 MG CAPS capsule Take 1 capsule (0.4 mg total) by mouth daily. 01/18/21   Allwardt, Randa Evens, PA-C  traMADol (ULTRAM) 50 MG tablet Take 1 tablet (50 mg total) by mouth every 6 (six) hours as needed for moderate pain. 01/18/21   Allwardt, Randa Evens, PA-C  Vitamin D, Ergocalciferol, 2000 units CAPS Take 2,000 Units by mouth daily.     [provider]    Allergies    Cefdinir and Codeine  Review of Systems   Review of Systems  Constitutional: Negative for chills and fever.  Eyes: Negative for pain and visual disturbance.  Respiratory: Negative for cough and shortness of breath.   Cardiovascular: Negative for chest pain and palpitations.  Gastrointestinal: Positive for abdominal pain and nausea. Negative for blood in stool, constipation, diarrhea, rectal pain and vomiting.  Genitourinary: Negative for difficulty urinating, dysuria, flank pain, hematuria, penile discharge and penile pain.  Musculoskeletal: Positive for back pain. Negative for arthralgias.  Skin: Negative for color change and rash.  Neurological: Negative for syncope and light-headedness.  Psychiatric/Behavioral: Negative for agitation and confusion.  All other systems reviewed and are negative.   Physical Exam Updated Vital Signs BP 118/73   Pulse (!) 55   Temp  97.6 F (36.4 C) (Oral)   Resp 18   Ht 5\' 7"  (1.702 m)   Wt 70.3 kg   SpO2 96%   BMI 24.28 kg/m   Physical Exam Constitutional:      General: He is not in acute distress. HENT:     Head: Normocephalic and atraumatic.  Eyes:     Conjunctiva/sclera: Conjunctivae normal.     Pupils: Pupils are equal, round, and reactive to light.  Cardiovascular:     Rate and Rhythm: Normal rate and regular rhythm.  Pulmonary:     Effort: Pulmonary effort is normal. No respiratory distress.  Abdominal:     General: There is no distension.     Tenderness: There is abdominal tenderness in the suprapubic area. There is no guarding or rebound. Positive signs include McBurney's sign. Negative signs include Murphy's sign.  Skin:    General: Skin is warm and dry.  Neurological:     General: No focal deficit present.  Mental Status: He is alert. Mental status is at baseline.  Psychiatric:        Mood and Affect: Mood normal.        Behavior: Behavior normal.     ED Results / Procedures / Treatments   Labs (all labs ordered are listed, but only abnormal results are displayed) Labs Reviewed  COMPREHENSIVE METABOLIC PANEL - Abnormal; Notable for the following components:      Result Value   Total Protein 6.4 (*)    All other components within normal limits  URINALYSIS, ROUTINE W REFLEX MICROSCOPIC - Abnormal; Notable for the following components:   Specific Gravity, Urine 1.036 (*)    Protein, ur TRACE (*)    All other components within normal limits  LIPASE, BLOOD  CBC    EKG None  Radiology CT Angio Abd/Pel W and/or Wo Contrast  Result Date: 01/19/2021 CLINICAL DATA:  Abdominal pain, aortic dissection suspected umbilical pain radiates to back x 1 week, worsening, EXAM: CTA ABDOMEN AND PELVIS WITHOUT AND WITH CONTRAST TECHNIQUE: Multidetector CT imaging of the abdomen and pelvis was performed using the standard protocol during bolus administration of intravenous contrast. Multiplanar  reconstructed images and MIPs were obtained and reviewed to evaluate the vascular anatomy. CONTRAST:  161mL OMNIPAQUE IOHEXOL 350 MG/ML SOLN COMPARISON:  Remote abdominal CT 01/22/2006 FINDINGS: Motion artifact limitations. VASCULAR Aorta: Normal in caliber. Mild to moderate calcified plaque and tortuosity. No dissection, vasculitis, or significant stenosis. Celiac: Mild calcified plaque at the origin without significant stenosis. No dissection, aneurysm, or vasculitis. SMA: Motion artifact limitations. Origin is widely patent. Apparent intraluminal density about the right lateral mid SMA is felt to be due to motion artifact through this region. There is no convincing dissection. No aneurysm, vasculitis, or significant stenosis. Renals: Both renal arteries are patent without evidence of aneurysm, dissection, vasculitis, fibromuscular dysplasia or significant stenosis. IMA: Patent without evidence of aneurysm, dissection, vasculitis or significant stenosis. Inflow: Patent without evidence of aneurysm, dissection, vasculitis or significant stenosis. Proximal Outflow: Bilateral common femoral and visualized portions of the superficial and profunda femoral arteries are patent without evidence of aneurysm, dissection, vasculitis or significant stenosis. Veins: No obvious venous abnormality within the limitations of this arterial phase study. Review of the MIP images confirms the above findings. NON-VASCULAR Lower chest: Minor subsegmental atelectasis in both lower lobes. No confluent consolidation. No pleural fluid. Included heart size is normal. Hepatobiliary: Few scattered hypodensities within the right and left lobe of the liver, largest anteriorly measuring 12 mm. This corresponds to cyst on prior exam. No evidence of suspicious liver lesion on arterial phase imaging. Gallbladder is partially distended. No calcified gallstone. Limited assessment for fat stranding due to motion through this region. There is no biliary  dilatation. Pancreas: No ductal dilatation or inflammation. Spleen: Normal in size and arterial enhancement. Adrenals/Urinary Tract: Normal adrenal glands. No hydronephrosis. No evidence of renal stone or focal lesion. Urinary bladder is minimally distended Stomach/Bowel: Mild distal esophageal wall thickening. The stomach is decompressed and not well assessed, motion artifact limitations. No small bowel dilatation or obstruction. No evidence of small bowel inflammation. Normal appendix. Moderate stool burden proximally. Small stool burden distally. Sigmoid colon is redundant. No colonic wall thickening or pericolonic edema. No significant diverticular disease. Lymphatic: No enlarged abdominopelvic lymph nodes. Reproductive: Prominent prostate gland spans 5 cm transverse. Other: No free air or ascites. Tiny fat containing umbilical hernia. Musculoskeletal: L5-S1 and L4-L5 degenerative disc disease. There are no acute or suspicious osseous abnormalities. IMPRESSION: 1.  Aortic atherosclerosis without dissection or acute aortic findings. 2. Mild distal esophageal wall thickening, can be seen with reflux or esophagitis. 3. Prominent prostate gland. Aortic Atherosclerosis (ICD10-I70.0). Electronically Signed   By: Keith Rake M.D.   On: 01/19/2021 16:50    Procedures Procedures   Medications Ordered in ED Medications  HYDROmorphone (DILAUDID) injection 1 mg (1 mg Intravenous Given 01/19/21 1604)  iohexol (OMNIPAQUE) 350 MG/ML injection 100 mL (100 mLs Intravenous Contrast Given 01/19/21 1609)    ED Course  I have reviewed the triage vital signs and the nursing notes.  Pertinent labs & imaging results that were available during my care of the patient were reviewed by me and considered in my medical decision making (see chart for details).  This patient presents to the Emergency Department with complaint of abdominal pain. This involves an extensive number of treatment options, and is a complaint that  carries with it a high risk of complications and morbidity.  The differential diagnosis includes bladder outlet obstruction vs bladder mass vs AAA vs appendicitis vs lymphadenopathy vs colitis vs other  Doubt prostatitis with negative UA today, negative urine fx yesterday, no dysuria or pain with urination.  I ordered, reviewed, and interpreted labs, including CMP, Lipase, CBC. There were no immediate, life-threatening emergencies found in this labwork.  Patient's UA showed no signs of infection or blood I ordered medication IV dilaudid for abdominal pain and/or nausea I ordered imaging studies which included CT abdomen/pelvis angio I independently visualized and interpreted imaging which showed no acute vascular injury, no evident appendicitis, enlarged prostate, and the monitor tracing which showed NSR  After the interventions stated above, I reevaluated the patient and found that they remained clinically stable.  Based on the patient's clinical exam, vital signs, risk factors, and ED testing, I felt that the patient's overall risk of life-threatening emergency such as bowel perforation, surgical emergency, or sepsis was quite low.  I suspect this clinical presentation is most consistent with bladder spasm vs muscular strain or nerve pain from cycling, but explained to the patient that this evaluation was not a definitive diagnostic workup.  I discussed outpatient follow up with primary care provider, and provided specialist office number on the patient's discharge paper if a referral was deemed necessary.  I discussed return precautions with the patient. I felt the patient was clinically stable for discharge.   Clinical Course as of 01/19/21 1757  Sat Jan 19, 2021  1634 Bladder scan with 40 cc urine [MT]  1657 IMPRESSION: 1. Aortic atherosclerosis without dissection or acute aortic findings. 2. Mild distal esophageal wall thickening, can be seen with reflux or esophagitis. 3. Prominent  prostate gland.  [MT]  1717 Pain is significantly improved, nearly gone after pain medication.  CT with no acute finding.  I did perform a prostate exam without any tenderness.  I doubt this is prostatitis.  Possible bladder spasms vs nerve entrapment (given his posture on the bicycle forward flexed for hours).  I'll prescribe B&O to try at home.  He has urology appointment in 2 weeks in early April.  Advised restraining from bicycle activity this week [MT]    Clinical Course User Index [MT] Abdulrahim Siddiqi, Carola Rhine, MD    Final Clinical Impression(s) / ED Diagnoses Final diagnoses:  Lower abdominal pain    Rx / DC Orders ED Discharge Orders         Ordered    belladonna-opium (B&O SUPPRETTES) 16.2-30 MG suppository  2 times daily PRN  01/19/21 1720           Wyvonnia Dusky, MD 01/19/21 1757

## 2021-01-19 NOTE — ED Triage Notes (Signed)
C/o L abd pain in the center of his abd radiating into his back and hips x 1 week. Saw PCP yesterday and started on flomax. Pain is worsening. Denies N/V/D

## 2021-01-20 LAB — URINE CULTURE
MICRO NUMBER:: 11693240
Result:: NO GROWTH
SPECIMEN QUALITY:: ADEQUATE

## 2021-01-21 DIAGNOSIS — R109 Unspecified abdominal pain: Secondary | ICD-10-CM | POA: Diagnosis not present

## 2021-01-21 DIAGNOSIS — R3912 Poor urinary stream: Secondary | ICD-10-CM | POA: Diagnosis not present

## 2021-01-21 DIAGNOSIS — N401 Enlarged prostate with lower urinary tract symptoms: Secondary | ICD-10-CM | POA: Diagnosis not present

## 2021-01-21 DIAGNOSIS — M545 Low back pain, unspecified: Secondary | ICD-10-CM | POA: Diagnosis not present

## 2021-02-18 DIAGNOSIS — Q268 Other congenital malformations of great veins: Secondary | ICD-10-CM | POA: Diagnosis not present

## 2021-02-19 DIAGNOSIS — R001 Bradycardia, unspecified: Secondary | ICD-10-CM | POA: Diagnosis not present

## 2021-02-19 DIAGNOSIS — I371 Nonrheumatic pulmonary valve insufficiency: Secondary | ICD-10-CM | POA: Diagnosis not present

## 2021-02-19 DIAGNOSIS — I288 Other diseases of pulmonary vessels: Secondary | ICD-10-CM | POA: Diagnosis not present

## 2021-02-19 DIAGNOSIS — Q249 Congenital malformation of heart, unspecified: Secondary | ICD-10-CM | POA: Diagnosis not present

## 2021-02-19 DIAGNOSIS — Q268 Other congenital malformations of great veins: Secondary | ICD-10-CM | POA: Diagnosis not present

## 2021-02-19 DIAGNOSIS — I4891 Unspecified atrial fibrillation: Secondary | ICD-10-CM | POA: Diagnosis not present

## 2021-02-19 DIAGNOSIS — I77810 Thoracic aortic ectasia: Secondary | ICD-10-CM | POA: Diagnosis not present

## 2021-02-19 DIAGNOSIS — I480 Paroxysmal atrial fibrillation: Secondary | ICD-10-CM | POA: Diagnosis not present

## 2021-03-11 ENCOUNTER — Telehealth: Payer: Self-pay

## 2021-03-11 DIAGNOSIS — I480 Paroxysmal atrial fibrillation: Secondary | ICD-10-CM

## 2021-03-11 MED ORDER — APIXABAN 5 MG PO TABS: 5.0000 mg | ORAL_TABLET | Freq: Two times a day (BID) | ORAL | 5 refills | Status: DC

## 2021-03-11 NOTE — Telephone Encounter (Signed)
Prescription refill request for Eliquis received. Indication:atrial fib Last office visit:4/22  Scr:0.89 4/22 Age: 70 Weight:70.3 kg  Prescription refilled

## 2021-03-12 ENCOUNTER — Encounter (HOSPITAL_BASED_OUTPATIENT_CLINIC_OR_DEPARTMENT_OTHER): Payer: Self-pay

## 2021-03-12 ENCOUNTER — Emergency Department (HOSPITAL_BASED_OUTPATIENT_CLINIC_OR_DEPARTMENT_OTHER)
Admission: EM | Admit: 2021-03-12 | Discharge: 2021-03-12 | Disposition: A | Discharge status: Home / Self Care | Payer: PPO | Attending: Emergency Medicine | Admitting: Emergency Medicine

## 2021-03-12 ENCOUNTER — Other Ambulatory Visit: Payer: Self-pay

## 2021-03-12 DIAGNOSIS — Z7901 Long term (current) use of anticoagulants: Secondary | ICD-10-CM | POA: Insufficient documentation

## 2021-03-12 DIAGNOSIS — R21 Rash and other nonspecific skin eruption: Secondary | ICD-10-CM | POA: Diagnosis present

## 2021-03-12 DIAGNOSIS — L03116 Cellulitis of left lower limb: Secondary | ICD-10-CM | POA: Insufficient documentation

## 2021-03-12 MED ORDER — APIXABAN 5 MG PO TABS: 5.0000 mg | ORAL_TABLET | Freq: Two times a day (BID) | ORAL | 1 refills | Status: DC

## 2021-03-12 MED ORDER — DOXYCYCLINE HYCLATE 100 MG PO TABS
100.0000 mg | ORAL_TABLET | Freq: Once | ORAL | Status: AC
  Administered 2021-03-12: 100 mg via ORAL
  Filled 2021-03-12: qty 1

## 2021-03-12 MED ORDER — DOXYCYCLINE HYCLATE 100 MG PO CAPS: 100.0000 mg | ORAL_CAPSULE | Freq: Two times a day (BID) | ORAL | 0 refills | Status: DC

## 2021-03-12 NOTE — ED Provider Notes (Signed)
[name] EM[facility] DEPT Provider Note   CSN: [id number] Arrival date & time: 03/12/21  1906     History Chief Complaint  Patient presents with  . Leg Injury    William Underwood is a 70 y.o. male.  The history is provided by the patient.  Rash Location:  Leg Leg rash location:  L upper leg Quality: redness   Severity:  Moderate Onset quality:  Gradual Duration:  4 days Timing:  Constant Progression:  Worsening Chronicity:  New Context comment:  Scratched it with a screwdriver at the onset of symptoms Relieved by:  Nothing Worsened by:  Nothing Ineffective treatments:  None tried Associated symptoms: no abdominal pain, no fever, no joint pain, no nausea, no shortness of breath, no sore throat and not vomiting        Past Medical History:  Diagnosis Date  . Benign prostatic hyperplasia without urinary obstruction 06/17/2014  . BPPV (benign paroxysmal positional vertigo) 07/01/2011  . Chronic hyperkalemia 05/20/2012   Overview:  Hx of for years  . Concussion syndrome 05/15/2011   This seems like a moderately severe concussion with at least 1 minute loss of consciousness in 15-20 minutes of significant confusion including being unable to state his name   . DEGENERATIVE JOINT DISEASE, HANDS 10/16/2009   Qualifier: Diagnosis of  By: [name], [name]    . Diverticulosis 06/20/2017  . Hemorrhoids, external 06/20/2017  . Lumbar spondylosis 06/20/2017   Fairly significant L4-L5 facet arthropathy with effusion.  Can consider facet injections and potentially RFA down the road.  Marland Kitchen NEUROPATHY 03/01/2008   Qualifier: Diagnosis of  By: [name], [name]    . Paroxysmal atrial fibrillation (HCC)   . Pulmonary insufficiency 03/09/2018   moderate PI noted on echo 03/09/18  . Pulmonary vein stenosis    left inferior pulmonary vein stenosis  . Right inguinal hernia 06/17/2014  . Sinus bradycardia   . Strain of gluteus medius 01/29/2017  . Stroke ([place]) 05/2020  . Vertigo      Patient Active Problem List   Diagnosis Date Noted  . Stroke ([place]) 06/22/2020  . Arthralgia 07/14/2019  . Pulmonary vein stenosis s/p stenting 2020 01/02/2019  . Chronic neck pain 08/23/2018  . Tinnitus 08/23/2018  . Lipoma 08/23/2018  . Paroxysmal atrial fibrillation ([place]) 05/25/2018  . Diverticulosis 06/20/2017  . Hemorrhoids, external 06/20/2017  . Benign prostatic hyperplasia without urinary obstruction 06/17/2014  . BPPV (benign paroxysmal positional vertigo) 07/01/2011  . DEGENERATIVE JOINT DISEASE, HANDS 10/16/2009  . NEUROPATHY 03/01/2008    Past Surgical History:  Procedure Laterality Date  . ANKLE ARTHROSCOPY    . ATRIAL FIBRILLATION ABLATION N/A 05/25/2018   Procedure: ATRIAL FIBRILLATION ABLATION;  Surgeon: Thompson Grayer, MD;  Location: [facility];  Service: Cardiovascular;  Laterality: N/A;  . KNEE ARTHROSCOPY         Family History  Problem Relation Age of Onset  . Hearing loss Father   . Colon cancer Neg Hx     Social History   Tobacco Use  . Smoking status: Never Smoker  . Smokeless tobacco: Never Used  Vaping Use  . Vaping Use: Never used  Substance Use Topics  . Alcohol use: No  . Drug use: No    Home Medications Prior to Admission medications   Medication Sig Start Date End Date Taking? Authorizing Provider  apixaban (ELIQUIS) 5 MG TABS tablet Take 1 tablet (5 mg total) by mouth 2 (two) times daily. 03/12/21   [name],  MD  atorvastatin (LIPITOR) 80 MG tablet Take 1 tablet (80 mg total) by mouth daily. 09/13/20   Duke, Tami Lin, PA  belladonna-opium (B&O SUPPRETTES) 16.2-30 MG suppository Place 1 suppository rectally 2 (two) times daily as needed for up to 12 doses for pain. 01/19/21   Wyvonnia Dusky, MD  diazepam (VALIUM) 5 MG tablet Take 1 tablet (5 mg total) by mouth daily as needed for anxiety. 08/23/18   [name], MD  Multiple Vitamins-Minerals (MULTIVITAMIN WITH MINERALS) tablet Take 1 tablet by mouth  daily.     [provider]  tamsulosin (FLOMAX) 0.4 MG CAPS capsule Take 1 capsule (0.4 mg total) by mouth daily. 01/18/21   Allwardt, Randa Evens, PA-C  traMADol (ULTRAM) 50 MG tablet Take 1 tablet (50 mg total) by mouth every 6 (six) hours as needed for moderate pain. 01/18/21   Allwardt, Randa Evens, PA-C  Vitamin D, Ergocalciferol, 2000 units CAPS Take 2,000 Units by mouth daily.     [provider]    Allergies    Cefdinir and Codeine  Review of Systems   Review of Systems  Constitutional: Negative for chills and fever.  HENT: Negative for ear pain and sore throat.   Eyes: Negative for pain and visual disturbance.  Respiratory: Negative for cough and shortness of breath.   Cardiovascular: Negative for chest pain and palpitations.  Gastrointestinal: Negative for abdominal pain, nausea and vomiting.  Genitourinary: Negative for dysuria and hematuria.  Musculoskeletal: Negative for arthralgias and back pain.  Skin: Positive for rash. Negative for color change.  Neurological: Negative for seizures and syncope.  All other systems reviewed and are negative.   Physical Exam Updated Vital Signs BP (!) 125/100 (BP Location: Left Arm)   Pulse (!) 51   Temp 98.3 F (36.8 C) (Oral)   Resp 18   Ht 5\' 7"  (1.702 m)   Wt 70.3 kg   SpO2 99%   BMI 24.28 kg/m   Physical Exam Vitals and nursing note reviewed.  Constitutional:      Appearance: Normal appearance.  HENT:     Head: Normocephalic and atraumatic.  Eyes:     Conjunctiva/sclera: Conjunctivae normal.  Pulmonary:     Effort: Pulmonary effort is normal. No respiratory distress.  Musculoskeletal:        General: No deformity. Normal range of motion.     Cervical back: Normal range of motion.  Skin:    General: Skin is warm and dry.     Comments: There is a small pustule at the anterior aspect of the left thigh at its proximal aspect.  There is a large, circumferential erythematous area surrounding this pustule.   The pustule is pinpoint.  Neurological:     General: No focal deficit present.     Mental Status: He is alert and oriented to person, place, and time. Mental status is at baseline.  Psychiatric:        Mood and Affect: Mood normal.     ED Results / Procedures / Treatments   Labs (all labs ordered are listed, but only abnormal results are displayed) Labs Reviewed - No data to display  EKG None  Radiology No results found.  Procedures Procedures   Medications Ordered in ED Medications  doxycycline (VIBRA-TABS) tablet 100 mg (has no administration in time range)    ED Course  I have reviewed the triage vital signs and the nursing notes.  Pertinent labs & imaging results that were available during my care  of the patient were reviewed by me and considered in my medical decision making (see chart for details).    MDM Rules/Calculators/A&P                          William Underwood presents with possible skin infection after a screwdriver injury.  He has a tiny pustule that does not need incision and drainage.  I do think he would benefit from antibiotics, and I have prescribed them.  He was given instructions on wound care and return precautions. Final Clinical Impression(s) / ED Diagnoses Final diagnoses:  Cellulitis of left lower extremity    Rx / DC Orders ED Discharge Orders         Ordered    doxycycline (VIBRAMYCIN) 100 MG capsule  2 times daily        03/12/21 2049           Arnaldo Natal, MD 03/12/21 2053

## 2021-03-12 NOTE — ED Triage Notes (Signed)
Pt c/o leg thigh wound. Pt hit himself with a screwdriver 4 days ago and it had scabbed over and then today it became reddened and inflamed. Pt also noticed pus coming from the area.

## 2021-03-12 NOTE — Discharge Instructions (Signed)
Apply warm compresses to the area twice daily.  Keep the area clean and covered with a Band-Aid and Neosporin.

## 2021-03-18 ENCOUNTER — Encounter (HOSPITAL_BASED_OUTPATIENT_CLINIC_OR_DEPARTMENT_OTHER): Payer: Self-pay

## 2021-03-18 ENCOUNTER — Emergency Department (HOSPITAL_BASED_OUTPATIENT_CLINIC_OR_DEPARTMENT_OTHER): Payer: PPO

## 2021-03-18 ENCOUNTER — Emergency Department (HOSPITAL_BASED_OUTPATIENT_CLINIC_OR_DEPARTMENT_OTHER)
Admission: EM | Admit: 2021-03-18 | Discharge: 2021-03-19 | Disposition: A | Discharge status: Home / Self Care | Payer: PPO | Attending: Emergency Medicine | Admitting: Emergency Medicine

## 2021-03-18 ENCOUNTER — Other Ambulatory Visit: Payer: Self-pay

## 2021-03-18 DIAGNOSIS — I6522 Occlusion and stenosis of left carotid artery: Secondary | ICD-10-CM | POA: Diagnosis not present

## 2021-03-18 DIAGNOSIS — R42 Dizziness and giddiness: Secondary | ICD-10-CM | POA: Insufficient documentation

## 2021-03-18 DIAGNOSIS — M542 Cervicalgia: Secondary | ICD-10-CM | POA: Diagnosis not present

## 2021-03-18 DIAGNOSIS — Z7901 Long term (current) use of anticoagulants: Secondary | ICD-10-CM | POA: Insufficient documentation

## 2021-03-18 DIAGNOSIS — G459 Transient cerebral ischemic attack, unspecified: Secondary | ICD-10-CM | POA: Diagnosis not present

## 2021-03-18 DIAGNOSIS — R001 Bradycardia, unspecified: Secondary | ICD-10-CM | POA: Diagnosis not present

## 2021-03-18 DIAGNOSIS — I4891 Unspecified atrial fibrillation: Secondary | ICD-10-CM | POA: Diagnosis not present

## 2021-03-18 DIAGNOSIS — R110 Nausea: Secondary | ICD-10-CM | POA: Diagnosis not present

## 2021-03-18 LAB — CBC
HCT: 42.2 % (ref 39.0–52.0)
Hemoglobin: 14.4 g/dL (ref 13.0–17.0)
MCH: 30.4 pg (ref 26.0–34.0)
MCHC: 34.1 g/dL (ref 30.0–36.0)
MCV: 89.2 fL (ref 80.0–100.0)
Platelets: 189 10*3/uL (ref 150–400)
RBC: 4.73 MIL/uL (ref 4.22–5.81)
RDW: 12.7 % (ref 11.5–15.5)
WBC: 6.6 10*3/uL (ref 4.0–10.5)
nRBC: 0 % (ref 0.0–0.2)

## 2021-03-18 LAB — CBG MONITORING, ED: Glucose-Capillary: 136 mg/dL — ABNORMAL HIGH (ref 70–99)

## 2021-03-18 LAB — BASIC METABOLIC PANEL
Anion gap: 8 (ref 5–15)
BUN: 19 mg/dL (ref 8–23)
CO2: 26 mmol/L (ref 22–32)
Calcium: 8.7 mg/dL — ABNORMAL LOW (ref 8.9–10.3)
Chloride: 105 mmol/L (ref 98–111)
Creatinine, Ser: 0.75 mg/dL (ref 0.61–1.24)
GFR, Estimated: >60 mL/min (ref >60)
Glucose, Bld: 139 mg/dL — ABNORMAL HIGH (ref 70–99)
Potassium: 3.6 mmol/L (ref 3.5–5.1)
Sodium: 139 mmol/L (ref 135–145)

## 2021-03-18 LAB — URINALYSIS, ROUTINE W REFLEX MICROSCOPIC
Bilirubin Urine: NEGATIVE
Glucose, UA: NEGATIVE mg/dL
Hgb urine dipstick: NEGATIVE
Ketones, ur: NEGATIVE mg/dL
Leukocytes,Ua: NEGATIVE
Nitrite: NEGATIVE
Specific Gravity, Urine: 1.032 — ABNORMAL HIGH (ref 1.005–1.030)
pH: 5.5 (ref 5.0–8.0)

## 2021-03-18 MED ORDER — ONDANSETRON HCL 4 MG/2ML IJ SOLN
4.0000 mg | Freq: Once | INTRAMUSCULAR | Status: AC | PRN
  Administered 2021-03-18: 4 mg via INTRAVENOUS
  Filled 2021-03-18: qty 2

## 2021-03-18 MED ORDER — IOHEXOL 350 MG/ML SOLN
100.0000 mL | Freq: Once | INTRAVENOUS | Status: AC | PRN
  Administered 2021-03-18: 75 mL via INTRAVENOUS

## 2021-03-18 NOTE — ED Provider Notes (Signed)
[facility] Provider Note: William Spurling, MD, FACEP  CSN: [id number] MRN: [id number] ARRIVAL: 03/18/21 at 2002 ROOM: [place]  03/18/21 11:00 PM William Underwood is a 70 y.o. male who is very athletic.  He runs marathons and participates in bicycle marathons on a regular basis.  He is status post remote stroke associated with A. fib for which he had made a full recovery.  He had an ablation and is no longer in atrial fibrillation.  He also has a history of pulmonary vein stenosis status post stenting.  He usually has resting bradycardia due to his physical fitness.  About 6:30 PM he leaned his head back and developed the sudden onset of dizziness by which he means a sense of being off balance he was not able to stand or walk and his equilibrium was off.  He had no focal weakness or numbness with this.  He had no visual changes.  He did develop nausea.  He states the symptoms were different than vertigo (room spinning).  The symptoms lasted about 20 minutes and resolved spontaneously.  He had some persistent nausea but never had any chest pain or shortness of breath.  EMS evaluated him in his house and his EKG showed normal sinus rhythm.  He continues to be asymptomatic now (he was given Zofran per protocol on arrival for his nausea).   Past Medical History:  Diagnosis Date  . Benign prostatic hyperplasia without urinary obstruction 06/17/2014  . BPPV (benign paroxysmal positional vertigo) 07/01/2011  . Chronic hyperkalemia 05/20/2012   Overview:  Hx of for years  . Concussion syndrome 05/15/2011   This seems like a moderately severe concussion with at least 1 minute loss of consciousness in 15-20 minutes of significant confusion including being unable to state his name   . DEGENERATIVE JOINT DISEASE, HANDS 10/16/2009   Qualifier: Diagnosis of  By: [name], [name]    . Diverticulosis 06/20/2017  . Hemorrhoids, external  06/20/2017  . Lumbar spondylosis 06/20/2017   Fairly significant L4-L5 facet arthropathy with effusion.  Can consider facet injections and potentially RFA down the road.  Marland Kitchen NEUROPATHY 03/01/2008   Qualifier: Diagnosis of  By: [name], [name]    . Paroxysmal atrial fibrillation (HCC)   . Pulmonary insufficiency 03/09/2018   moderate PI noted on echo 03/09/18  . Pulmonary vein stenosis    left inferior pulmonary vein stenosis  . Right inguinal hernia 06/17/2014  . Sinus bradycardia   . Strain of gluteus medius 01/29/2017  . Stroke ([place]) 05/2020  . Vertigo     Past Surgical History:  Procedure Laterality Date  . ANKLE ARTHROSCOPY    . ATRIAL FIBRILLATION ABLATION N/A 05/25/2018   Procedure: ATRIAL FIBRILLATION ABLATION;  Surgeon: Thompson Grayer, MD;  Location: [facility];  Service: Cardiovascular;  Laterality: N/A;  . KNEE ARTHROSCOPY      Family History  Problem Relation Age of Onset  . Hearing loss Father   . Colon cancer Neg Hx     Social History   Tobacco Use  . Smoking status: Never Smoker  . Smokeless tobacco: Never Used  Vaping Use  . Vaping Use: Never used  Substance Use Topics  . Alcohol use: Yes    Comment: Occasionally  . Drug use: No    Prior to Admission medications   Medication Sig Start Date End Date Taking? Authorizing Provider  apixaban (ELIQUIS) 5 MG TABS tablet  Take 1 tablet (5 mg total) by mouth 2 (two) times daily. 03/12/21   [name], MD  atorvastatin (LIPITOR) 80 MG tablet Take 1 tablet (80 mg total) by mouth daily. 09/13/20   [facility], Tami Lin, PA  belladonna-opium (B&O SUPPRETTES) 16.2-30 MG suppository Place 1 suppository rectally 2 (two) times daily as needed for up to 12 doses for pain. 01/19/21   Wyvonnia Dusky, MD  diazepam (VALIUM) 5 MG tablet Take 1 tablet (5 mg total) by mouth daily as needed for anxiety. 08/23/18   [name], MD  doxycycline (VIBRAMYCIN) 100 MG capsule Take 1 capsule (100 mg total) by mouth 2 (two)  times daily. 03/12/21   Arnaldo Natal, MD  Multiple Vitamins-Minerals (MULTIVITAMIN WITH MINERALS) tablet Take 1 tablet by mouth daily.     [provider]  tamsulosin (FLOMAX) 0.4 MG CAPS capsule Take 1 capsule (0.4 mg total) by mouth daily. 01/18/21   Allwardt, Randa Evens, PA-C  traMADol (ULTRAM) 50 MG tablet Take 1 tablet (50 mg total) by mouth every 6 (six) hours as needed for moderate pain. 01/18/21   Allwardt, Randa Evens, PA-C  Vitamin D, Ergocalciferol, 2000 units CAPS Take 2,000 Units by mouth daily.     [provider]    Allergies Cefdinir and Codeine   REVIEW OF SYSTEMS  Negative except as noted here or in the History of Present Illness.   PHYSICAL EXAMINATION  Initial Vital Signs Blood pressure 129/74, pulse (!) 51, temperature 98 F (36.7 C), temperature source Oral, resp. rate 16, height 5\' 7"  (1.702 m), weight 70.3 kg, SpO2 98 %.  Examination General: Well-developed, well-nourished male in no acute distress; appearance consistent with age of record HENT: normocephalic; atraumatic Eyes: pupils equal, round and reactive to light; extraocular muscles intact Neck: supple; no bruitbs or gallo Heart: regular rate and rhythm; no murmur; bradycardia Lungs: clear to auscultation bilaterally Abdomen: soft; nondistended; nontender; bowel sounds present Extremities: No deformity; full range of motion; pulses normal Neurologic: Awake, alert and oriented; motor function intact in all extremities and symmetric; no facial droop Skin: Warm and dry Psychiatric: Normal mood and affect   RESULTS  Summary of this visit's results, reviewed and interpreted by myself:   EKG Interpretation  Date/Time:  Monday Mar 18 2021 20:13:42 EDT Ventricular Rate:  53 PR Interval:  156 QRS Duration: 96 QT Interval:  434 QTC Calculation: 407 R Axis:   70 Text Interpretation: Sinus bradycardia no acute ST/T changes Confirmed by Sherwood Gambler [phone]) on 03/18/2021 9:25:46 PM       Laboratory Studies: Results for orders placed or performed during the hospital encounter of 03/18/21 (from the past 24 hour(s))  CBG monitoring, ED     Status: Abnormal   Collection Time: 03/18/21  8:21 PM  Result Value Ref Range   Glucose-Capillary 136 (H) 70 - 99 mg/dL  Basic metabolic panel     Status: Abnormal   Collection Time: 03/18/21  8:26 PM  Result Value Ref Range   Sodium 139 135 - 145 mmol/L   Potassium 3.6 3.5 - 5.1 mmol/L   Chloride 105 98 - 111 mmol/L   CO2 26 22 - 32 mmol/L   Glucose, Bld 139 (H) 70 - 99 mg/dL   BUN 19 8 - 23 mg/dL   Creatinine, Ser 0.75 0.61 - 1.24 mg/dL   Calcium 8.7 (L) 8.9 - 10.3 mg/dL   GFR, Estimated >60 >60 mL/min   Anion gap 8 5 - 15  CBC  Status: None   Collection Time: 03/18/21  8:26 PM  Result Value Ref Range   WBC 6.6 4.0 - 10.5 K/uL   RBC 4.73 4.22 - 5.81 MIL/uL   Hemoglobin 14.4 13.0 - 17.0 g/dL   HCT 42.2 39.0 - 52.0 %   MCV 89.2 80.0 - 100.0 fL   MCH 30.4 26.0 - 34.0 pg   MCHC 34.1 30.0 - 36.0 g/dL   RDW 12.7 11.5 - 15.5 %   Platelets 189 150 - 400 K/uL   nRBC 0.0 0.0 - 0.2 %  Urinalysis, Routine w reflex microscopic Urine, Clean Catch     Status: Abnormal   Collection Time: 03/18/21 10:08 PM  Result Value Ref Range   Color, Urine YELLOW YELLOW   APPearance CLEAR CLEAR   Specific Gravity, Urine 1.032 (H) 1.005 - 1.030   pH 5.5 5.0 - 8.0   Glucose, UA NEGATIVE NEGATIVE mg/dL   Hgb urine dipstick NEGATIVE NEGATIVE   Bilirubin Urine NEGATIVE NEGATIVE   Ketones, ur NEGATIVE NEGATIVE mg/dL   Protein, ur TRACE (A) NEGATIVE mg/dL   Nitrite NEGATIVE NEGATIVE   Leukocytes,Ua NEGATIVE NEGATIVE   Imaging Studies: CT Angio Neck W and/or Wo Contrast  Result Date: 03/19/2021 CLINICAL DATA:  Initial evaluation for acute nausea. EXAM: CT ANGIOGRAPHY NECK TECHNIQUE: Multidetector CT imaging of the neck was performed using the standard protocol during bolus administration of intravenous contrast. Multiplanar CT image  reconstructions and MIPs were obtained to evaluate the vascular anatomy. Carotid stenosis measurements (when applicable) are obtained utilizing NASCET criteria, using the distal internal carotid diameter as the denominator. CONTRAST:  22mL OMNIPAQUE IOHEXOL 350 MG/ML SOLN COMPARISON:  Prior study from 06/22/2020. FINDINGS: Aortic arch: Visualized aortic arch normal in caliber with normal 3 vessel morphology. Minor atheromatous change within the arch itself. No hemodynamically significant stenosis seen about the origin of the great vessels. Right carotid system: Right CCA tortuous proximally but is widely patent to the bifurcation without stenosis. No significant atheromatous narrowing about the right bifurcation. Right ICA patent distally without stenosis, dissection or occlusion. Left carotid system: Left CCA tortuous proximally but is widely patent to the bifurcation. Scattered calcified plaque about the left bifurcation without hemodynamically significant stenosis. Left ICA patent distally without stenosis, dissection or occlusion. Vertebral arteries: Both vertebral arteries arise from the subclavian arteries. No proximal subclavian artery stenosis. Left vertebral artery dominant. Vertebral arteries patent without stenosis, dissection, or occlusion. Skeleton: No visible acute osseous finding. No discrete or worrisome osseous lesions. Other neck: No other acute soft tissue abnormality within the neck. No mass or adenopathy. Upper chest: Visualized upper chest demonstrates no acute finding. IMPRESSION: 1. Negative CTA of the neck. No large vessel occlusion, dissection or other acute vascular abnormality. 2. Atheromatous change about the left carotid bifurcation without significant stenosis. No hemodynamically significant stenosis identified. 3. Tortuosity of the major arterial vasculature of the neck, suggesting chronic underlying hypertension. Electronically Signed   By: Jeannine Boga M.D.   On: 03/19/2021  00:50    ED COURSE and MDM  Nursing notes, initial and subsequent vitals signs, including pulse oximetry, reviewed and interpreted by myself.  Vitals:   03/18/21 2245 03/18/21 2315 03/19/21 0000 03/19/21 0015  BP: 125/65 128/75 119/72 125/63  Pulse: (!) 51 (!) 45  (!) 54  Resp: 16 18  18   Temp:      TempSrc:      SpO2: 93% 96%  96%  Weight:      Height:  Medications  ondansetron (ZOFRAN) injection 4 mg (4 mg Intravenous Given 03/18/21 2024)  iohexol (OMNIPAQUE) 350 MG/ML injection 100 mL (75 mLs Intravenous Contrast Given 03/18/21 2330)   The initiating action for this episode was the patient leaning his head back with a sudden onset of symptoms.  This was concerning for vertebral artery occlusion or dissection but CT is negative.  Patient was advised of CT findings.  He states if his symptoms recur he will follow-up with a neurologist.   PROCEDURES  Procedures   ED DIAGNOSES     ICD-10-CM   1. Dizziness after extension of neck  R42        Ebert Forrester, MD 03/19/21 4332

## 2021-03-18 NOTE — ED Triage Notes (Signed)
Patient here POV from Home with Acute Onset Nausea. Lightheadedness, Indigestion.  Patient was eating at Home when all these feelings came suddenly   VSS with EMS; GCS 15; Ambulatory but Weak.

## 2021-04-08 ENCOUNTER — Telehealth: Payer: Self-pay | Admitting: Adult Health

## 2021-04-08 ENCOUNTER — Ambulatory Visit: Payer: PPO | Admitting: Adult Health

## 2021-04-08 NOTE — Telephone Encounter (Signed)
Ov cancelled due to np being out sick

## 2021-04-25 DIAGNOSIS — S42292A Other displaced fracture of upper end of left humerus, initial encounter for closed fracture: Secondary | ICD-10-CM | POA: Diagnosis not present

## 2021-04-25 DIAGNOSIS — S43005A Unspecified dislocation of left shoulder joint, initial encounter: Secondary | ICD-10-CM | POA: Diagnosis not present

## 2021-04-25 DIAGNOSIS — S42291A Other displaced fracture of upper end of right humerus, initial encounter for closed fracture: Secondary | ICD-10-CM | POA: Diagnosis not present

## 2021-04-25 DIAGNOSIS — I4891 Unspecified atrial fibrillation: Secondary | ICD-10-CM | POA: Diagnosis not present

## 2021-04-25 DIAGNOSIS — R519 Headache, unspecified: Secondary | ICD-10-CM | POA: Diagnosis not present

## 2021-04-25 DIAGNOSIS — M25512 Pain in left shoulder: Secondary | ICD-10-CM | POA: Diagnosis not present

## 2021-04-25 DIAGNOSIS — Z7901 Long term (current) use of anticoagulants: Secondary | ICD-10-CM | POA: Diagnosis not present

## 2021-04-25 DIAGNOSIS — M542 Cervicalgia: Secondary | ICD-10-CM | POA: Diagnosis not present

## 2021-04-25 DIAGNOSIS — S43014A Anterior dislocation of right humerus, initial encounter: Secondary | ICD-10-CM | POA: Diagnosis not present

## 2021-04-30 DIAGNOSIS — M67912 Unspecified disorder of synovium and tendon, left shoulder: Secondary | ICD-10-CM | POA: Diagnosis not present

## 2021-05-07 ENCOUNTER — Encounter: Payer: Self-pay | Admitting: Family Medicine

## 2021-05-07 DIAGNOSIS — F411 Generalized anxiety disorder: Secondary | ICD-10-CM

## 2021-05-07 MED ORDER — DIAZEPAM 5 MG PO TABS: 5.0000 mg | ORAL_TABLET | Freq: Every day | ORAL | 1 refills | Status: DC | PRN

## 2021-05-07 NOTE — Telephone Encounter (Signed)
Pt requesting Diazepam 5 mg tab LOV: 01/18/2021 Next Visit: 09/02/2021 Last written: 08/23/2018  Approve?

## 2021-05-09 DIAGNOSIS — M25512 Pain in left shoulder: Secondary | ICD-10-CM | POA: Diagnosis not present

## 2021-05-14 DIAGNOSIS — M25512 Pain in left shoulder: Secondary | ICD-10-CM | POA: Diagnosis not present

## 2021-05-16 DIAGNOSIS — M75122 Complete rotator cuff tear or rupture of left shoulder, not specified as traumatic: Secondary | ICD-10-CM | POA: Diagnosis not present

## 2021-05-28 DIAGNOSIS — M75122 Complete rotator cuff tear or rupture of left shoulder, not specified as traumatic: Secondary | ICD-10-CM | POA: Diagnosis not present

## 2021-05-31 DIAGNOSIS — R200 Anesthesia of skin: Secondary | ICD-10-CM | POA: Diagnosis not present

## 2021-06-04 DIAGNOSIS — S143XXA Injury of brachial plexus, initial encounter: Secondary | ICD-10-CM | POA: Diagnosis not present

## 2021-06-06 DIAGNOSIS — M75122 Complete rotator cuff tear or rupture of left shoulder, not specified as traumatic: Secondary | ICD-10-CM | POA: Diagnosis not present

## 2021-06-12 ENCOUNTER — Telehealth: Payer: Self-pay | Admitting: Internal Medicine

## 2021-06-12 DIAGNOSIS — I700 Atherosclerosis of aorta: Secondary | ICD-10-CM

## 2021-06-12 NOTE — Telephone Encounter (Signed)
     [facility] Pre-operative Risk Assessment    Patient Name: William Underwood  DOB: September 29, 1951 MRN: [id number]  HEARTCARE STAFF:  - IMPORTANT!!!!!! Under Visit Info/Reason for Call, type in Other and utilize the format Clearance MM/DD/YY or Clearance TBD. Do not use dashes or single digits. - Please review there is not already an duplicate clearance open for this procedure. - If request is for dental extraction, please clarify the # of teeth to be extracted. - If the patient is currently at the dentist's office, call Pre-Op Callback Staff (MA/nurse) to input urgent request.  - If the patient is not currently in the dentist office, please route to the Pre-Op pool.  Request for surgical clearance:  What type of surgery is being performed? Left Shoulder Arthroscopy manie open, subacromial decompression and a distal clavicle excision   When is this surgery scheduled? 06/24/21  What type of clearance is required (medical clearance vs. Pharmacy clearance to hold med vs. Both)? Both  Are there any medications that need to be held prior to surgery and how long? Eliquis  Practice name and name of physician performing surgery? Dr. Dorna Leitz   What is the office phone number? [phone]   7.   What is the office fax number? [phone]  8.   Anesthesia type (None, local, MAC, general) ? Choice    William Underwood 06/12/2021, 10:19 AM  _________________________________________________________________   (provider comments below)

## 2021-06-13 DIAGNOSIS — I700 Atherosclerosis of aorta: Secondary | ICD-10-CM | POA: Insufficient documentation

## 2021-06-13 MED ORDER — ENOXAPARIN SODIUM 100 MG/ML IJ SOSY: 100.0000 mg | PREFILLED_SYRINGE | INTRAMUSCULAR | 0 refills | Status: DC

## 2021-06-13 NOTE — Telephone Encounter (Signed)
   Name: William Underwood  DOB: January 04, 1951  MRN: [id number]   Primary Cardiologist: [name], MD  Chart reviewed as part of pre-operative protocol coverage. Patient was contacted 06/13/2021 in reference to pre-operative risk assessment for pending surgery as outlined below.  William Underwood was last seen on 08/24/20 by Dr. Margaretann Loveless.  Since that day, William Underwood has done well. He exercises 2 hrs daily without angina.   Per our clinical pharmacist: Will hold Eliquis for 3 days prior to procedure and bridge with Lovenox 1.'5mg'$ /kg once daily in the few days prior to procedure.    8/25 - last dose of Eliquis at 7AM. Inject Lovenox '100mg'$  SQ at 7PM.   8/26 -  Inject Lovenox '100mg'$  SQ at 7PM. No Eliquis.   8/27 -  Inject Lovenox '100mg'$  SQ at 7PM. No Eliquis.   8/28 - No Lovenox, no Eliquis.   8/29 - procedure date. No anticoagulation pre-procedure. Resume Eliquis as soon as safely possible at discretion of MD performing procedure.   I have called pt and reviewed Lovenox injection instructions with him. Rx has been sent to pharmacy and above instructions sent via MyChart as well.  Therefore, based on ACC/AHA guidelines, the patient would be at acceptable risk for the planned procedure without further cardiovascular testing.   The patient was advised that if he develops new symptoms prior to surgery to contact our office to arrange for a follow-up visit, and he verbalized understanding.  I will route this recommendation to the requesting party via Epic fax function and remove from pre-op pool. Please call with questions.  Tami Lin Londa Mackowski, PA 06/13/2021, 9:46 AM

## 2021-06-13 NOTE — Telephone Encounter (Signed)
Will hold Eliquis for 3 days prior to procedure and bridge with Lovenox 1.'5mg'$ /kg once daily in the few days prior to procedure.   8/25 - last dose of Eliquis at 7AM. Inject Lovenox '100mg'$  SQ at 7PM.  8/26 -  Inject Lovenox '100mg'$  SQ at 7PM. No Eliquis.  8/27 -  Inject Lovenox '100mg'$  SQ at 7PM. No Eliquis.  8/28 - No Lovenox, no Eliquis.  8/29 - procedure date. No anticoagulation pre-procedure. Resume Eliquis as soon as safely possible at discretion of MD performing procedure.  I have called pt and reviewed Lovenox injection instructions with him. Rx has been sent to pharmacy and above instructions sent via MyChart as well.

## 2021-06-13 NOTE — Telephone Encounter (Signed)
Patient with diagnosis of afib on Eliquis for anticoagulation.    Procedure: Left Shoulder Arthroscopy, subacromial decompression and a distal clavicle excision   Date of procedure: 06/24/21  CHA2DS2-VASc Score = 4  This indicates a 4.8% annual risk of stroke. The patient's score is based upon: CHF History: No HTN History: No Diabetes History: No Stroke History: Yes Vascular Disease History: Yes Age Score: 1 Gender Score: 0   CrCl 64m/min Platelet count 189K  Likely will need 2-3 day Eliquis hold prior to procedure. Given embolic stroke in August 2021, prefer to minimize time off of anticoagulation. Will forward to MD for input regarding anticoag length of hold.

## 2021-06-24 ENCOUNTER — Ambulatory Visit: Payer: PPO | Admitting: Adult Health

## 2021-06-24 DIAGNOSIS — Y999 Unspecified external cause status: Secondary | ICD-10-CM | POA: Diagnosis not present

## 2021-06-24 DIAGNOSIS — M19012 Primary osteoarthritis, left shoulder: Secondary | ICD-10-CM | POA: Diagnosis not present

## 2021-06-24 DIAGNOSIS — G8918 Other acute postprocedural pain: Secondary | ICD-10-CM | POA: Diagnosis not present

## 2021-06-24 DIAGNOSIS — S43432A Superior glenoid labrum lesion of left shoulder, initial encounter: Secondary | ICD-10-CM | POA: Diagnosis not present

## 2021-06-24 DIAGNOSIS — M7542 Impingement syndrome of left shoulder: Secondary | ICD-10-CM | POA: Diagnosis not present

## 2021-06-24 DIAGNOSIS — S46012A Strain of muscle(s) and tendon(s) of the rotator cuff of left shoulder, initial encounter: Secondary | ICD-10-CM | POA: Diagnosis not present

## 2021-06-24 DIAGNOSIS — X58XXXA Exposure to other specified factors, initial encounter: Secondary | ICD-10-CM | POA: Diagnosis not present

## 2021-06-24 DIAGNOSIS — M7522 Bicipital tendinitis, left shoulder: Secondary | ICD-10-CM | POA: Diagnosis not present

## 2021-06-24 DIAGNOSIS — M67814 Other specified disorders of tendon, left shoulder: Secondary | ICD-10-CM | POA: Diagnosis not present

## 2021-06-24 DIAGNOSIS — M94212 Chondromalacia, left shoulder: Secondary | ICD-10-CM | POA: Diagnosis not present

## 2021-06-24 HISTORY — PX: ROTATOR CUFF REPAIR: SHX139

## 2021-06-27 DIAGNOSIS — M25612 Stiffness of left shoulder, not elsewhere classified: Secondary | ICD-10-CM | POA: Diagnosis not present

## 2021-06-27 DIAGNOSIS — M6281 Muscle weakness (generalized): Secondary | ICD-10-CM | POA: Diagnosis not present

## 2021-07-02 DIAGNOSIS — M6281 Muscle weakness (generalized): Secondary | ICD-10-CM | POA: Diagnosis not present

## 2021-07-02 DIAGNOSIS — M25612 Stiffness of left shoulder, not elsewhere classified: Secondary | ICD-10-CM | POA: Diagnosis not present

## 2021-07-04 DIAGNOSIS — M25612 Stiffness of left shoulder, not elsewhere classified: Secondary | ICD-10-CM | POA: Diagnosis not present

## 2021-07-04 DIAGNOSIS — M6281 Muscle weakness (generalized): Secondary | ICD-10-CM | POA: Diagnosis not present

## 2021-07-08 ENCOUNTER — Ambulatory Visit: Payer: PPO | Admitting: Adult Health

## 2021-07-08 ENCOUNTER — Encounter: Payer: Self-pay | Admitting: Adult Health

## 2021-07-08 VITALS — BP 137/79 | HR 56 | Ht 67.0 in | Wt 158.0 lb

## 2021-07-08 DIAGNOSIS — I480 Paroxysmal atrial fibrillation: Secondary | ICD-10-CM | POA: Diagnosis not present

## 2021-07-08 DIAGNOSIS — M25612 Stiffness of left shoulder, not elsewhere classified: Secondary | ICD-10-CM | POA: Diagnosis not present

## 2021-07-08 DIAGNOSIS — I6349 Cerebral infarction due to embolism of other cerebral artery: Secondary | ICD-10-CM | POA: Diagnosis not present

## 2021-07-08 DIAGNOSIS — M6281 Muscle weakness (generalized): Secondary | ICD-10-CM | POA: Diagnosis not present

## 2021-07-08 NOTE — Patient Instructions (Signed)
Continue Eliquis (apixaban) daily  and atorvastatin  for secondary stroke prevention  Repeat labs next month with PCP and if LDL (or bad cholesterol) remains less <70, can consider decreasing dosage of atorvastatin   Continue to follow up with PCP regarding cholesterol management  Maintain strict control of cholesterol with LDL cholesterol (bad cholesterol) goal below 70 mg/dL.       Followup in the future with me in 6 months or call earlier if needed       Thank you for coming to see [place] at [facility]. I hope we have been able to provide you high quality care today.  You may receive a patient satisfaction survey over the next few weeks. We would appreciate your feedback and comments so that we may continue to improve ourselves and the health of our patients.

## 2021-07-08 NOTE — Progress Notes (Signed)
[facility] [address] [place]. [place] [address] [phone]       OFFICE FOLLOW UP NOTE  William Underwood Date of Birth:  09/22/1951 Medical Record Number:  [id number]    Primary neurologist: Dr. Leonie Man PCP: [name], MD  Reason for Referral: Stroke   Chief Complaint  Patient presents with   Follow-up    RM 3 alone Pt is well and stable, no new symptoms.  Still having some imbalance and cant gather words.      HPI:   Today, 07/08/2021, William Underwood returns for routine stroke follow-up regarding multiple embolic strokes in [id number] unaccompanied. Overall stable.  Denies new stroke/TIA symptoms.  Reports improvement of balance since working with therapies but not quite at baseline. He continues to run and ride his bike. Continues to hike but with use of walking stick. Reports occasional word finding difficulty (more so compared to baseline). Denies cognitive difficulties. Double vision has since resolved around 11/2020. Compliant on Eliquis and atorvastatin.  Blood pressure today 137/79.  Seen by cardiology 01/2021. Has f/u with PCP next month for annual physical.  He did suffer a shoulder dislocation in June while in [place] playing pickle ball resulting in fracture, torn tendons including bicep and rotator cuff injury.  He did have a shoulder reset while in [place] under anesthesia due to severity.  He had to wait 8 weeks prior to any type of procedure to allow healing time for fracture.  He underwent surgical procedure on 8/29 by orthopedic surgeon Dr. Berenice Primas.  He is currently working with PT but needs to wear sling at all times.  No further concerns at this time.     History provided for reference purposes only Initial visit 10/08/2020 Dr. Leonie Man: William Underwood is a pleasant 70 year old Caucasian male seen today for initial office consultation visit for stroke.  History is obtained from the patient, review of electronic medical records and I personally  reviewed imaging films in PACS.  William Underwood has past medical history of paroxysmal atrial fibrillation treated with cardiac ablation with complication of pulmonary vein stenosis treated with stenting in the [facility] clinic.  Also history of concussion, benign paroxysmal positional vertigo and benign prostatic hypertrophy.  He presented on 06/22/2020 with sudden onset of right-sided weakness and numbness which lasted about 20 minutes and resolved by the time he reached the ER.  While waiting there is developed recurrence of symptoms as well as double vision, unsteady gait and slurred speech and a code stroke was called.  He was given IV TPA uneventfully.  CT scan of the head was normal.  CT angiogram was negative for large vessel intracranial extracranial stenosis.  There is mild atherosclerotic disease at left carotid bifurcation without significant stenosis.  MRI scan of the brain showed numerous small cortical infarcts involving left frontal and parietal cortex as well as left thalamus and pons and midbrain in both cerebellar hemispheres the pattern and distribution consistent with central cardiac source of embolism.  2D echo showed normal ejection fraction of 60 to 65% without cardiac source of embolism.  LDL cholesterol was elevated 108 mg percent and hemoglobin A1c at 5.5.  Patient had previously been on Xarelto for a year following his ablation but his cardiologist gave in clinic change him to aspirin since he had no recurrence of A. fib.  Patient was restarted on Eliquis during this admission for stroke and seems to be tolerating well without bruising or bleeding.  He states his speech has  recovered completely and right-sided weakness and strength is also improved.  He still however has persistent double vision and some gait imbalance.  He is currently getting outpatient physical therapy.  He has seen an ophthalmologist who prescribed a prism which she states helped him only for few days and he is no longer  finding it to be beneficial.  He has not been referred to outpatient occupational therapy and I recently referred him for speech therapy.  Patient tells me that he is actually had TIAs in early part of some of this year.  He had a transient episode of right upper extremity weakness and numbness which lasted about 20 minutes this time did not seek medical help.  He had a previous TIA in summer 2020 as well when on 04/17/2019 while riding his bike after having written for 36 miles he noticed that he could not steer his bike straight and felt that he was leaning to one side.  He stopped and symptoms resolved after a while. He had an MRI scan of the brain at that time which showed no acute abnormalities.  He was asked to follow-up as outpatient with neurologist but this did not happen.  Is tolerating Lipitor 80 mg well without muscle aches and pains.  He has started walking but not started biking yet.     ROS:   14 system review of systems is positive for those listed in HPI all other systems negative  PMH:  Past Medical History:  Diagnosis Date   Benign prostatic hyperplasia without urinary obstruction 06/17/2014   BPPV (benign paroxysmal positional vertigo) 07/01/2011   Chronic hyperkalemia 05/20/2012   Overview:  Hx of for years   Concussion syndrome 05/15/2011   This seems like a moderately severe concussion with at least 1 minute loss of consciousness in 15-20 minutes of significant confusion including being unable to state his name    DEGENERATIVE JOINT DISEASE, HANDS 10/16/2009   Qualifier: Diagnosis of  By: [name], [name]     Diverticulosis 06/20/2017   Hemorrhoids, external 06/20/2017   Lumbar spondylosis 06/20/2017   Fairly significant L4-L5 facet arthropathy with effusion.  Can consider facet injections and potentially RFA down the road.   NEUROPATHY 03/01/2008   Qualifier: Diagnosis of  By: [name], [name]     Paroxysmal atrial fibrillation ([place])    Pulmonary insufficiency 03/09/2018    moderate PI noted on echo 03/09/18   Pulmonary vein stenosis    left inferior pulmonary vein stenosis   Right inguinal hernia 06/17/2014   Sinus bradycardia    Strain of gluteus medius 01/29/2017   Stroke ([place]) 05/2020   Vertigo     Social History:  Social History   Socioeconomic History   Marital status: Legally Separated    Spouse name: Not on file   Number of children: Not on file   Years of education: Not on file   Highest education level: Not on file  Occupational History   Occupation: retired  Tobacco Use   Smoking status: Never   Smokeless tobacco: Never  Vaping Use   Vaping Use: Never used  Substance and Sexual Activity   Alcohol use: Yes    Comment: Occasionally   Drug use: No   Sexual activity: Not Currently  Other Topics Concern   Not on file  Social History Narrative   Lives alone   Right Handed   Drinks 3-4 cups caffeine daily   Social Determinants of Health   Financial Resource Strain: Not on  file  Food Insecurity: Not on file  Transportation Needs: Not on file  Physical Activity: Not on file  Stress: Not on file  Social Connections: Not on file  Intimate Partner Violence: Not on file    Medications:   Current Outpatient Medications on File Prior to Visit  Medication Sig Dispense Refill   apixaban (ELIQUIS) 5 MG TABS tablet Take 1 tablet (5 mg total) by mouth 2 (two) times daily. 180 tablet 1   atorvastatin (LIPITOR) 80 MG tablet Take 1 tablet (80 mg total) by mouth daily. 90 tablet 3   diazepam (VALIUM) 5 MG tablet Take 1 tablet (5 mg total) by mouth daily as needed for anxiety. 30 tablet 1   Multiple Vitamins-Minerals (MULTIVITAMIN WITH MINERALS) tablet Take 1 tablet by mouth daily.      tamsulosin (FLOMAX) 0.4 MG CAPS capsule Take 1 capsule (0.4 mg total) by mouth daily. 30 capsule 3   traMADol (ULTRAM) 50 MG tablet Take 1 tablet (50 mg total) by mouth every 6 (six) hours as needed for moderate pain. (Patient taking differently: Take 50 mg by  mouth as needed for moderate pain.) 30 tablet 5   Vitamin D, Ergocalciferol, 2000 units CAPS Take 2,000 Units by mouth daily.      No current facility-administered medications on file prior to visit.    Allergies:   Allergies  Allergen Reactions   Cefdinir Palpitations and Rash   Codeine Other (See Comments)    Sensitive to this = agitation    Physical Exam Today's Vitals   07/08/21 0934  BP: 137/79  Pulse: (!) 56  Weight: 158 lb (71.7 kg)  Height: '5\' 7"'$  (1.702 m)   Body mass index is 24.75 kg/m.   General: well developed, well nourished very pleasant middle-aged Caucasian male, seated, in no evident distress Head: head normocephalic and atraumatic.   Neck: supple with no carotid or supraclavicular bruits Cardiovascular: regular rate and rhythm, no murmurs Musculoskeletal: left arm in sling s/p shoulder surgical repair (see HPI) Skin:  no rash/petichiae Vascular:  Normal pulses all extremities  Neurologic Exam Mental Status: Awake and fully alert.  Fluent speech and language.  Oriented to place and time. Recent and remote memory intact. Attention span, concentration and fund of knowledge appropriate. Mood and affect appropriate.  Cranial Nerves: Pupils equal, briskly reactive to light. Extraocular movements normal without nystagmus or abnormal movements. Visual fields full to confrontation. Hearing intact. Facial sensation intact. Face, tongue, palate moves normally and symmetrically.  Motor: Normal bulk and tone. Normal strength in all tested extremity muscles although unable to test LUE Sensory.: intact to touch , pinprick , position and vibratory sensation.  Coordination: Rapid alternating movements normal in all extremities. Finger-to-nose performed accurately RUE and heel-to-shin performed accurately bilaterally.  Unable to test LUE Gait and Station: Arises from chair without difficulty. Stance is normal. Gait demonstrates normal stride length and balance . Able to heel,  toe and tandem walk with mild difficulty.  Reflexes: 1+ and symmetric. Toes downgoing.       ASSESSMENT/PLAN: 70 year old Caucasian male with multiple embolic strokes in August 2021 likely related to history of atrial fibrillation even though cardioverted few years ago.  Vascular risk factors of hyperlipidemia, history of A. fib and age    55.  Multiple embolic strokes -Residual deficits occasional word finding difficulty and mild imbalance but overall recovered well -Continue Eliquis 5 mg twice daily and atorvastatin 80 mg daily for secondary stroke prevention -Continue to follow with cardiology Dr. Higinio Plan (  [facility]) and PCP Dr. [name] aggressive stroke risk factor management and monitoring and management of atrial fibrillation -HLD: LDL goal<70.  Prior lipid panel satisfactory (08/2020).  Continue atorvastatin 80 mg daily per PCP/cardiology. Plans to repeat lipid panel next month with PCP.  If LDL remains less than 70, can consider decreasing dose per pt request   Follow-up in 6 months or call earlier if needed    CC:  [facility] provider: Dr. Julian Reil, [name], MD   I spent 32 minutes of face-to-face and non-face-to-face time with patient.  This included previsit chart review, lab review, study review, electronic health record documentation, patient education and discussion regarding history of prior strokes and residual deficits, secondary stroke prevention measures and aggressive stroke risk factor management and answered all other questions to patient's satisfaction  Frann Rider, [facility]  [facility] [address] [place], [place] [address]  Phone [phone] Fax [phone] Note: This document was prepared with digital dictation and possible smart phrase technology. Any transcriptional errors that result from this process are unintentional.

## 2021-07-10 DIAGNOSIS — M6281 Muscle weakness (generalized): Secondary | ICD-10-CM | POA: Diagnosis not present

## 2021-07-10 DIAGNOSIS — M25612 Stiffness of left shoulder, not elsewhere classified: Secondary | ICD-10-CM | POA: Diagnosis not present

## 2021-07-15 DIAGNOSIS — M25612 Stiffness of left shoulder, not elsewhere classified: Secondary | ICD-10-CM | POA: Diagnosis not present

## 2021-07-15 DIAGNOSIS — M6281 Muscle weakness (generalized): Secondary | ICD-10-CM | POA: Diagnosis not present

## 2021-07-17 DIAGNOSIS — M25612 Stiffness of left shoulder, not elsewhere classified: Secondary | ICD-10-CM | POA: Diagnosis not present

## 2021-07-17 DIAGNOSIS — M6281 Muscle weakness (generalized): Secondary | ICD-10-CM | POA: Diagnosis not present

## 2021-07-22 DIAGNOSIS — M25612 Stiffness of left shoulder, not elsewhere classified: Secondary | ICD-10-CM | POA: Diagnosis not present

## 2021-07-22 DIAGNOSIS — M6281 Muscle weakness (generalized): Secondary | ICD-10-CM | POA: Diagnosis not present

## 2021-07-23 ENCOUNTER — Telehealth: Payer: Self-pay | Admitting: Internal Medicine

## 2021-07-23 DIAGNOSIS — E785 Hyperlipidemia, unspecified: Secondary | ICD-10-CM

## 2021-07-23 DIAGNOSIS — I480 Paroxysmal atrial fibrillation: Secondary | ICD-10-CM

## 2021-07-23 MED ORDER — APIXABAN 5 MG PO TABS: 5.0000 mg | ORAL_TABLET | Freq: Two times a day (BID) | ORAL | 1 refills | Status: DC

## 2021-07-23 MED ORDER — ATORVASTATIN CALCIUM 80 MG PO TABS: 80.0000 mg | ORAL_TABLET | Freq: Every day | ORAL | 3 refills | Status: DC

## 2021-07-23 NOTE — Telephone Encounter (Signed)
Prescription refill request for Eliquis received. Indication:afib Last office visit:acharya 08/24/20 Scr:0.8 02/19/21 Age: 36m Weight:71.7kg

## 2021-07-23 NOTE — Telephone Encounter (Signed)
*  STAT* If patient is at the pharmacy, call can be transferred to refill team.   1. Which medications need to be refilled? (please list name of each medication and dose if known)  apixaban (ELIQUIS) 5 MG TABS tablet; atorvastatin (LIPITOR) 80 MG tablet  2. Which pharmacy/location (including street and city if local pharmacy) is medication to be sent to?  [id number] - [place], [place] - [address]  3. Do they need a 30 day or 90 day supply? 90 day supply with 3 refills

## 2021-07-25 DIAGNOSIS — M6281 Muscle weakness (generalized): Secondary | ICD-10-CM | POA: Diagnosis not present

## 2021-07-25 DIAGNOSIS — M25612 Stiffness of left shoulder, not elsewhere classified: Secondary | ICD-10-CM | POA: Diagnosis not present

## 2021-07-29 DIAGNOSIS — M6281 Muscle weakness (generalized): Secondary | ICD-10-CM | POA: Diagnosis not present

## 2021-07-29 DIAGNOSIS — M25612 Stiffness of left shoulder, not elsewhere classified: Secondary | ICD-10-CM | POA: Diagnosis not present

## 2021-07-31 DIAGNOSIS — M25612 Stiffness of left shoulder, not elsewhere classified: Secondary | ICD-10-CM | POA: Diagnosis not present

## 2021-07-31 DIAGNOSIS — M6281 Muscle weakness (generalized): Secondary | ICD-10-CM | POA: Diagnosis not present

## 2021-08-05 DIAGNOSIS — M6281 Muscle weakness (generalized): Secondary | ICD-10-CM | POA: Diagnosis not present

## 2021-08-05 DIAGNOSIS — M25612 Stiffness of left shoulder, not elsewhere classified: Secondary | ICD-10-CM | POA: Diagnosis not present

## 2021-08-07 DIAGNOSIS — M6281 Muscle weakness (generalized): Secondary | ICD-10-CM | POA: Diagnosis not present

## 2021-08-07 DIAGNOSIS — M25612 Stiffness of left shoulder, not elsewhere classified: Secondary | ICD-10-CM | POA: Diagnosis not present

## 2021-08-12 DIAGNOSIS — M25612 Stiffness of left shoulder, not elsewhere classified: Secondary | ICD-10-CM | POA: Diagnosis not present

## 2021-08-12 DIAGNOSIS — M6281 Muscle weakness (generalized): Secondary | ICD-10-CM | POA: Diagnosis not present

## 2021-08-19 DIAGNOSIS — M6281 Muscle weakness (generalized): Secondary | ICD-10-CM | POA: Diagnosis not present

## 2021-08-19 DIAGNOSIS — M25612 Stiffness of left shoulder, not elsewhere classified: Secondary | ICD-10-CM | POA: Diagnosis not present

## 2021-08-21 DIAGNOSIS — M25612 Stiffness of left shoulder, not elsewhere classified: Secondary | ICD-10-CM | POA: Diagnosis not present

## 2021-08-21 DIAGNOSIS — M6281 Muscle weakness (generalized): Secondary | ICD-10-CM | POA: Diagnosis not present

## 2021-08-26 DIAGNOSIS — M25612 Stiffness of left shoulder, not elsewhere classified: Secondary | ICD-10-CM | POA: Diagnosis not present

## 2021-08-26 DIAGNOSIS — M6281 Muscle weakness (generalized): Secondary | ICD-10-CM | POA: Diagnosis not present

## 2021-08-28 DIAGNOSIS — M6281 Muscle weakness (generalized): Secondary | ICD-10-CM | POA: Diagnosis not present

## 2021-08-28 DIAGNOSIS — M25612 Stiffness of left shoulder, not elsewhere classified: Secondary | ICD-10-CM | POA: Diagnosis not present

## 2021-09-02 ENCOUNTER — Other Ambulatory Visit: Payer: Self-pay

## 2021-09-02 ENCOUNTER — Ambulatory Visit (INDEPENDENT_AMBULATORY_CARE_PROVIDER_SITE_OTHER): Payer: PPO | Admitting: Family Medicine

## 2021-09-02 ENCOUNTER — Encounter: Payer: Self-pay | Admitting: Family Medicine

## 2021-09-02 VITALS — BP 130/78 | HR 52 | Temp 97.6°F | Ht 67.0 in | Wt 157.0 lb

## 2021-09-02 DIAGNOSIS — I480 Paroxysmal atrial fibrillation: Secondary | ICD-10-CM | POA: Diagnosis not present

## 2021-09-02 DIAGNOSIS — N400 Enlarged prostate without lower urinary tract symptoms: Secondary | ICD-10-CM | POA: Diagnosis not present

## 2021-09-02 DIAGNOSIS — E785 Hyperlipidemia, unspecified: Secondary | ICD-10-CM | POA: Insufficient documentation

## 2021-09-02 DIAGNOSIS — R739 Hyperglycemia, unspecified: Secondary | ICD-10-CM | POA: Diagnosis not present

## 2021-09-02 DIAGNOSIS — F411 Generalized anxiety disorder: Secondary | ICD-10-CM | POA: Diagnosis not present

## 2021-09-02 DIAGNOSIS — I639 Cerebral infarction, unspecified: Secondary | ICD-10-CM

## 2021-09-02 DIAGNOSIS — Z0001 Encounter for general adult medical examination with abnormal findings: Secondary | ICD-10-CM | POA: Diagnosis not present

## 2021-09-02 DIAGNOSIS — I700 Atherosclerosis of aorta: Secondary | ICD-10-CM | POA: Diagnosis not present

## 2021-09-02 LAB — LIPID PANEL
Cholesterol: 111 mg/dL (ref 0–200)
HDL: 53 mg/dL (ref >39.00)
LDL Cholesterol: 50 mg/dL (ref 0–99)
NonHDL: 58.31
Total CHOL/HDL Ratio: 2
Triglycerides: 42 mg/dL (ref 0.0–149.0)
VLDL: 8.4 mg/dL (ref 0.0–40.0)

## 2021-09-02 LAB — TSH: TSH: 1.04 u[IU]/mL (ref 0.35–5.50)

## 2021-09-02 LAB — CBC
HCT: 43.2 % (ref 39.0–52.0)
Hemoglobin: 14.5 g/dL (ref 13.0–17.0)
MCHC: 33.6 g/dL (ref 30.0–36.0)
MCV: 89.4 fl (ref 78.0–100.0)
Platelets: 192 10*3/uL (ref 150.0–400.0)
RBC: 4.84 Mil/uL (ref 4.22–5.81)
RDW: 13.6 % (ref 11.5–15.5)
WBC: 8 10*3/uL (ref 4.0–10.5)

## 2021-09-02 LAB — COMPREHENSIVE METABOLIC PANEL
ALT: 36 U/L (ref 0–53)
AST: 36 U/L (ref 0–37)
Albumin: 4.2 g/dL (ref 3.5–5.2)
Alkaline Phosphatase: 54 U/L (ref 39–117)
BUN: 19 mg/dL (ref 6–23)
CO2: 28 mEq/L (ref 19–32)
Calcium: 9.6 mg/dL (ref 8.4–10.5)
Chloride: 106 mEq/L (ref 96–112)
Creatinine, Ser: 0.81 mg/dL (ref 0.40–1.50)
GFR: 89.11 mL/min (ref >60.00)
Glucose, Bld: 103 mg/dL — ABNORMAL HIGH (ref 70–99)
Potassium: 5.1 mEq/L (ref 3.5–5.1)
Sodium: 144 mEq/L (ref 135–145)
Total Bilirubin: 0.7 mg/dL (ref 0.2–1.2)
Total Protein: 6.5 g/dL (ref 6.0–8.3)

## 2021-09-02 LAB — PSA: PSA: 1.64 ng/mL (ref 0.10–4.00)

## 2021-09-02 LAB — HEMOGLOBIN A1C: Hgb A1c MFr Bld: 5.6 % (ref 4.6–6.5)

## 2021-09-02 MED ORDER — DIAZEPAM 5 MG PO TABS: 5.0000 mg | ORAL_TABLET | Freq: Every day | ORAL | 1 refills | Status: DC | PRN

## 2021-09-02 MED ORDER — TRAMADOL HCL 50 MG PO TABS: 50.0000 mg | ORAL_TABLET | Freq: Four times a day (QID) | ORAL | 5 refills | Status: DC | PRN

## 2021-09-02 NOTE — Patient Instructions (Signed)
It was very nice to see you today!  We will check blood work today.  We will refill your medications.  We will see you back in 1 year for your next physical.  Please come back to see [place] sooner if needed.  Take care, Dr [name]  PLEASE NOTE:  If you had any lab tests please let us know if you have not heard back within a few days. You may see your results on mychart before we have a chance to review them but we will give you a call once they are reviewed by [place]. If we ordered any referrals today, please let us know if you have not heard from their office within the next week.   Please try these tips to maintain a healthy lifestyle:  Eat at least 3 REAL meals and 1-2 snacks per day.  Aim for no more than 5 hours between eating.  If you eat breakfast, please do so within one hour of getting up.   Each meal should contain half fruits/vegetables, one quarter protein, and one quarter carbs (no bigger than a computer mouse)  Cut down on sweet beverages. This includes juice, soda, and sweet tea.   Drink at least 1 glass of water with each meal and aim for at least 8 glasses per day  Exercise at least 150 minutes every week.    Preventive Care 54 Years and Older, Male Preventive care refers to lifestyle choices and visits with your health care provider that can promote health and wellness. Preventive care visits are also called wellness exams. What can I expect for my preventive care visit? Counseling During your preventive care visit, your health care provider may ask about your: Medical history, including: Past medical problems. Family medical history. History of falls. Current health, including: Emotional well-being. Home life and relationship well-being. Sexual activity. Memory and ability to understand (cognition). Lifestyle, including: Alcohol, nicotine or tobacco, and drug use. Access to firearms. Diet, exercise, and sleep habits. Work and work Statistician. Sunscreen  use. Safety issues such as seatbelt and bike helmet use. Physical exam Your health care provider will check your: Height and weight. These may be used to calculate your BMI (body mass index). BMI is a measurement that tells if you are at a healthy weight. Waist circumference. This measures the distance around your waistline. This measurement also tells if you are at a healthy weight and may help predict your risk of certain diseases, such as type 2 diabetes and high blood pressure. Heart rate and blood pressure. Body temperature. Skin for abnormal spots. What immunizations do I need? Vaccines are usually given at various ages, according to a schedule. Your health care provider will recommend vaccines for you based on your age, medical history, and lifestyle or other factors, such as travel or where you work. What tests do I need? Screening Your health care provider may recommend screening tests for certain conditions. This may include: Lipid and cholesterol levels. Diabetes screening. This is done by checking your blood sugar (glucose) after you have not eaten for a while (fasting). Hepatitis C test. Hepatitis B test. HIV (human immunodeficiency virus) test. STI (sexually transmitted infection) testing, if you are at risk. Lung cancer screening. Colorectal cancer screening. Prostate cancer screening. Abdominal aortic aneurysm (AAA) screening. You may need this if you are a current or former smoker. Talk with your health care provider about your test results, treatment options, and if necessary, the need for more tests. Follow these instructions at home:  Eating and drinking  Eat a diet that includes fresh fruits and vegetables, whole grains, lean protein, and low-fat dairy products. Limit your intake of foods with high amounts of sugar, saturated fats, and salt. Take vitamin and mineral supplements as recommended by your health care provider. Do not drink alcohol if your health care  provider tells you not to drink. If you drink alcohol: Limit how much you have to 0-2 drinks a day. Know how much alcohol is in your drink. In the [place] one drink equals one 12 oz bottle of beer (355 mL), one 5 oz glass of wine (148 mL), or one 1 oz glass of hard liquor (44 mL). Lifestyle Brush your teeth every morning and night with fluoride toothpaste. Floss one time each day. Exercise for at least 30 minutes 5 or more days each week. Do not use any products that contain nicotine or tobacco. These products include cigarettes, chewing tobacco, and vaping devices, such as e-cigarettes. If you need help quitting, ask your health care provider. Do not use drugs. If you are sexually active, practice safe sex. Use a condom or other form of protection to prevent STIs. Take aspirin only as told by your health care provider. Make sure that you understand how much to take and what form to take. Work with your health care provider to find out whether it is safe and beneficial for you to take aspirin daily. Ask your health care provider if you need to take a cholesterol-lowering medicine (statin). Find healthy ways to manage stress, such as: Meditation, yoga, or listening to music. Journaling. Talking to a trusted person. Spending time with friends and family. Safety Always wear your seat belt while driving or riding in a vehicle. Do not drive: If you have been drinking alcohol. Do not ride with someone who has been drinking. When you are tired or distracted. While texting. If you have been using any mind-altering substances or drugs. Wear a helmet and other protective equipment during sports activities. If you have firearms in your house, make sure you follow all gun safety procedures. Minimize exposure to UV radiation to reduce your risk of skin cancer. What's next? Visit your health care provider once a year for an annual wellness visit. Ask your health care provider how often you should have  your eyes and teeth checked. Stay up to date on all vaccines. This information is not intended to replace advice given to you by your health care provider. Make sure you discuss any questions you have with your health care provider. Document Revised: 04/10/2021 Document Reviewed: 04/10/2021 Elsevier Patient Education  [place].

## 2021-09-02 NOTE — Assessment & Plan Note (Signed)
Anticoagulated on Eliquis.  Continue management per cardiology.  Regular rate and rhythm today.

## 2021-09-02 NOTE — Assessment & Plan Note (Signed)
On Eliquis and statin. 

## 2021-09-02 NOTE — Assessment & Plan Note (Signed)
No residual deficits.  Continue Lipitor and Eliquis.

## 2021-09-02 NOTE — Assessment & Plan Note (Signed)
On Flomax 0.4 mg daily.  Check PSA. 

## 2021-09-02 NOTE — Progress Notes (Signed)
Chief Complaint:  William Underwood is a 70 y.o. male who presents today for his annual comprehensive physical exam.    Assessment/Plan:  Chronic Problems Addressed Today: No problem-specific Assessment & Plan notes found for this encounter.   Preventative Healthcare: Check Labs. UTD on colonoscopy.   Patient Counseling(The following topics were reviewed and/or handout was given):  -Nutrition: Stressed importance of moderation in sodium/caffeine intake, saturated fat and cholesterol, caloric balance, sufficient intake of fresh fruits, vegetables, and fiber.  -Stressed the importance of regular exercise.   -Substance Abuse: Discussed cessation/primary prevention of tobacco, alcohol, or other drug use; driving or other dangerous activities under the influence; availability of treatment for abuse.   -Injury prevention: Discussed safety belts, safety helmets, smoke detector, smoking near bedding or upholstery.   -Sexuality: Discussed sexually transmitted diseases, partner selection, use of condoms, avoidance of unintended pregnancy and contraceptive alternatives.   -Dental health: Discussed importance of regular tooth brushing, flossing, and dental visits.  -Health maintenance and immunizations reviewed. Please refer to Health maintenance section.  Return to care in 1 year for next preventative visit.     Subjective:  HPI:  He has no acute complaints today.   After around 4 months of therapy, his balance and feeling had improved significantly to the point of fully recovering. In February, his double vision had also resolved.  In July he was playing a sport and ran his shoulder into a fence. This resulted in dislocating his shoulder, breaking a bone in his shoulder, tearing his bicep at the rotator cuff, and tearing tendons.  He was wondering about possibly reducing his Lipitor dosage, as he does not have any cholesterol issues but was prescribed it as a precaution due to his history of  heart problems and stroke.  Also, he notes that he has "all the symptoms" of BPH, and would like his PSA numbers checked. Flomax has worked well for him currently. He states the last prostate exam, that his prostate was found to not be significantly enlarged.  In addition to this, he desires to check his blood sugar and kidney function, as he has FMHx of diabetes with his mother, and his father passed away recently due to kidney failure. His uncle had also passed a few years ago due to kidney failure.  He also states that he gets neck pain when trying to sleep. In regards to this, he takes diazepam as needed to help deal with his tinnitus to help him get to sleep. Lifestyle Diet: Reasonably healthy diet. Exercise: Hikes, runs, bikes, does sports.  Depression screen PHQ 2/9 08/30/2020  Decreased Interest 2  Down, Depressed, Hopeless 2  PHQ - 2 Score 4  Altered sleeping 1  Tired, decreased energy 1  Change in appetite 0  Feeling bad or failure about yourself  1  Trouble concentrating 1  Moving slowly or fidgety/restless 0  Suicidal thoughts 0  PHQ-9 Score 8  Difficult doing work/chores Not difficult at all  Some recent data might be hidden    Health Maintenance Due  Topic Date Due   TETANUS/TDAP  Never done   Zoster Vaccines- Shingrix (2 of 2) 10/21/2019     ROS: Per HPI, otherwise a complete review of systems was negative.   PMH:  The following were reviewed and entered/updated in epic: Past Medical History:  Diagnosis Date   Benign prostatic hyperplasia without urinary obstruction 06/17/2014   BPPV (benign paroxysmal positional vertigo) 07/01/2011   Chronic hyperkalemia 05/20/2012   Overview:  Hx of for years   Concussion syndrome 05/15/2011   This seems like a moderately severe concussion with at least 1 minute loss of consciousness in 15-20 minutes of significant confusion including being unable to state his name    [place], HANDS 10/16/2009   Qualifier:  Diagnosis of  By: [name], [name]     Diverticulosis 06/20/2017   Hemorrhoids, external 06/20/2017   Lumbar spondylosis 06/20/2017   Fairly significant L4-L5 facet arthropathy with effusion.  Can consider facet injections and potentially RFA down the road.   NEUROPATHY 03/01/2008   Qualifier: Diagnosis of  By: [name], [name]     Paroxysmal atrial fibrillation ([place])    Pulmonary insufficiency 03/09/2018   moderate PI noted on echo 03/09/18   Pulmonary vein stenosis    left inferior pulmonary vein stenosis   Right inguinal hernia 06/17/2014   Sinus bradycardia    Strain of gluteus medius 01/29/2017   Stroke ([place]) 05/2020   Vertigo    Patient Active Problem List   Diagnosis Date Noted   Aortic atherosclerosis ([place]) 06/13/2021   Stroke ([place]) 06/22/2020   Arthralgia 07/14/2019   Pulmonary vein stenosis s/p stenting 2020 01/02/2019   Chronic neck pain 08/23/2018   Tinnitus 08/23/2018   Lipoma 08/23/2018   Paroxysmal atrial fibrillation ([place]) 05/25/2018   Diverticulosis 06/20/2017   Hemorrhoids, external 06/20/2017   Benign prostatic hyperplasia without urinary obstruction 06/17/2014   BPPV (benign paroxysmal positional vertigo) 07/01/2011   DEGENERATIVE JOINT DISEASE, HANDS 10/16/2009   NEUROPATHY 03/01/2008   Past Surgical History:  Procedure Laterality Date   ANKLE ARTHROSCOPY     ATRIAL FIBRILLATION ABLATION N/A 05/25/2018   Procedure: ATRIAL FIBRILLATION ABLATION;  Surgeon: Thompson Grayer, MD;  Location: [facility];  Service: Cardiovascular;  Laterality: N/A;   KNEE ARTHROSCOPY     ROTATOR CUFF REPAIR Left 06/24/2021    Family History  Problem Relation Age of Onset   Hearing loss Father    Colon cancer Neg Hx     Medications- reviewed and updated Current Outpatient Medications  Medication Sig Dispense Refill   apixaban (ELIQUIS) 5 MG TABS tablet Take 1 tablet (5 mg total) by mouth 2 (two) times daily. 180 tablet 1   atorvastatin (LIPITOR) 80 MG tablet Take 1 tablet  (80 mg total) by mouth daily. 90 tablet 3   diazepam (VALIUM) 5 MG tablet Take 1 tablet (5 mg total) by mouth daily as needed for anxiety. 30 tablet 1   Multiple Vitamins-Minerals (MULTIVITAMIN WITH MINERALS) tablet Take 1 tablet by mouth daily.      tamsulosin (FLOMAX) 0.4 MG CAPS capsule Take 1 capsule (0.4 mg total) by mouth daily. 30 capsule 3   traMADol (ULTRAM) 50 MG tablet Take 1 tablet (50 mg total) by mouth every 6 (six) hours as needed for moderate pain. (Patient taking differently: Take 50 mg by mouth as needed for moderate pain.) 30 tablet 5   Vitamin D, Ergocalciferol, 2000 units CAPS Take 2,000 Units by mouth daily.      No current facility-administered medications for this visit.    Allergies-reviewed and updated Allergies  Allergen Reactions   Cefdinir Palpitations and Rash   Codeine Other (See Comments)    Sensitive to this = agitation    Social History   Socioeconomic History   Marital status: Legally Separated    Spouse name: Not on file   Number of children: Not on file   Years of education: Not on file  Highest education level: Not on file  Occupational History   Occupation: retired  Tobacco Use   Smoking status: Never   Smokeless tobacco: Never  Vaping Use   Vaping Use: Never used  Substance and Sexual Activity   Alcohol use: Yes    Comment: Occasionally   Drug use: No   Sexual activity: Not Currently  Other Topics Concern   Not on file  Social History Narrative   Lives alone   Right Handed   Drinks 3-4 cups caffeine daily   Social Determinants of Health   Financial Resource Strain: Not on file  Food Insecurity: Not on file  Transportation Needs: Not on file  Physical Activity: Not on file  Stress: Not on file  Social Connections: Not on file        Objective:  Physical Exam: There were no vitals taken for this visit.  There is no height or weight on file to calculate BMI. Wt Readings from Last 3 Encounters:  07/08/21 158 lb (71.7  kg)  03/18/21 155 lb (70.3 kg)  03/12/21 155 lb (70.3 kg)   Gen: NAD, resting comfortably HEENT: TMs normal bilaterally. OP clear. No thyromegaly noted.  CV: RRR with no murmurs appreciated Pulm: NWOB, CTAB with no crackles, wheezes, or rhonchi GI: Normal bowel sounds present. Soft, Nontender, Nondistended. MSK: no edema, cyanosis, or clubbing noted Skin: warm, dry Neuro: CN2-12 grossly intact. Strength 5/5 in upper and lower extremities. Reflexes symmetric and intact bilaterally.  Psych: Normal affect and thought content     I,Jordan Kelly,acting as a scribe for Dimas Chyle, MD.,have documented all relevant documentation on the behalf of Dimas Chyle, MD,as directed by  Dimas Chyle, MD while in the presence of Dimas Chyle, MD.  I, Dimas Chyle, MD, have reviewed all documentation for this visit. The documentation on 09/02/21 for the exam, diagnosis, procedures, and orders are all accurate and complete.  [name]. [name], MD 09/02/2021 7:53 AM

## 2021-09-02 NOTE — Assessment & Plan Note (Signed)
On Lipitor 80 mg daily.  We will check lipids today.  Ideally would like to get LDL less than 70 given his history of stroke.

## 2021-09-03 ENCOUNTER — Ambulatory Visit (INDEPENDENT_AMBULATORY_CARE_PROVIDER_SITE_OTHER): Payer: PPO

## 2021-09-03 DIAGNOSIS — Z0000 Encounter for general adult medical examination without abnormal findings: Secondary | ICD-10-CM

## 2021-09-03 NOTE — Progress Notes (Signed)
Please inform patient of the following:  Labs are all STABLE. Would like for him to keep up the good work and we can recheck in a year or so.  William Underwood. [name], MD 09/03/2021 11:40 AM

## 2021-09-03 NOTE — Progress Notes (Addendum)
Virtual Visit via Telephone Note  I connected with  William Underwood on 09/03/21 at  9:30 AM EST by telephone and verified that I am speaking with the correct person using two identifiers.  Medicare Annual Wellness visit completed telephonically due to Covid-19 pandemic.   Persons participating in this call: This Health Coach and this patient.   Location: Patient: Home Provider: Office   I discussed the limitations, risks, security and privacy concerns of performing an evaluation and management service by telephone and the availability of in person appointments. The patient expressed understanding and agreed to proceed.  Unable to perform video visit due to video visit attempted and failed and/or patient does not have video capability.   Some vital signs may be absent or patient reported.   Willette Brace, LPN   Subjective:   William Underwood is a 70 y.o. male who presents for Medicare Annual/Subsequent preventive examination.  Review of Systems     Cardiac Risk Factors include: advanced age (>37men, >45 women);dyslipidemia;male gender     Objective:    There were no vitals filed for this visit. There is no height or weight on file to calculate BMI.  Advanced Directives 09/03/2021 03/18/2021 03/12/2021 01/19/2021 07/16/2020 06/27/2020 06/23/2020  Does Patient Have a Medical Advance Directive? Yes No No No Yes Yes No  Type of Advance Directive [place]  Does patient want to make changes to medical advance directive? - - - - - - -  Copy of [place] in Chart? No - copy requested - - - - - -  Would patient like information on creating a medical advance directive? - No - Patient declined - - - - No - Patient declined    Current Medications (verified) Outpatient Encounter Medications as of 09/03/2021  Medication Sig   apixaban (ELIQUIS) 5 MG TABS tablet Take 1 tablet (5 mg total) by mouth 2 (two) times daily.   atorvastatin (LIPITOR)  80 MG tablet Take 1 tablet (80 mg total) by mouth daily.   diazepam (VALIUM) 5 MG tablet Take 1 tablet (5 mg total) by mouth daily as needed for anxiety.   Multiple Vitamins-Minerals (MULTIVITAMIN WITH MINERALS) tablet Take 1 tablet by mouth daily.    tamsulosin (FLOMAX) 0.4 MG CAPS capsule Take 1 capsule (0.4 mg total) by mouth daily.   traMADol (ULTRAM) 50 MG tablet Take 1 tablet (50 mg total) by mouth every 6 (six) hours as needed for moderate pain.   Vitamin D, Ergocalciferol, 2000 units CAPS Take 2,000 Units by mouth daily.    FLUAD QUADRIVALENT 0.5 ML injection    PFIZER COVID-19 VAC BIVALENT injection    No facility-administered encounter medications on file as of 09/03/2021.    Allergies (verified) Cefdinir and Codeine   History: Past Medical History:  Diagnosis Date   Benign prostatic hyperplasia without urinary obstruction 06/17/2014   BPPV (benign paroxysmal positional vertigo) 07/01/2011   Chronic hyperkalemia 05/20/2012   Overview:  Hx of for years   Concussion syndrome 05/15/2011   This seems like a moderately severe concussion with at least 1 minute loss of consciousness in 15-20 minutes of significant confusion including being unable to state his name    [place], HANDS 10/16/2009   Qualifier: Diagnosis of  By: [name], [name]     Diverticulosis 06/20/2017   Hemorrhoids, external 06/20/2017   Lumbar spondylosis 06/20/2017   Fairly significant L4-L5 facet arthropathy with effusion.  Can consider facet injections and potentially RFA down the road.   NEUROPATHY 03/01/2008   Qualifier: Diagnosis of  By: [name], [name]     Paroxysmal atrial fibrillation ([place])    Pulmonary insufficiency 03/09/2018   moderate PI noted on echo 03/09/18   Pulmonary vein stenosis    left inferior pulmonary vein stenosis   Right inguinal hernia 06/17/2014   Sinus bradycardia    Strain of gluteus medius 01/29/2017   Stroke ([place]) 05/2020   Vertigo    Past Surgical History:   Procedure Laterality Date   ANKLE ARTHROSCOPY     ATRIAL FIBRILLATION ABLATION N/A 05/25/2018   Procedure: ATRIAL FIBRILLATION ABLATION;  Surgeon: Thompson Grayer, MD;  Location: [facility];  Service: Cardiovascular;  Laterality: N/A;   KNEE ARTHROSCOPY     ROTATOR CUFF REPAIR Left 06/24/2021   Family History  Problem Relation Age of Onset   Hearing loss Father    Colon cancer Neg Hx    Social History   Socioeconomic History   Marital status: Legally Separated    Spouse name: Not on file   Number of children: Not on file   Years of education: Not on file   Highest education level: Not on file  Occupational History   Occupation: retired  Tobacco Use   Smoking status: Never   Smokeless tobacco: Never  Vaping Use   Vaping Use: Never used  Substance and Sexual Activity   Alcohol use: Yes    Comment: Occasionally   Drug use: No   Sexual activity: Not Currently  Other Topics Concern   Not on file  Social History Narrative   Lives alone   Right Handed   Drinks 3-4 cups caffeine daily   Social Determinants of Health   Financial Resource Strain: Low Risk    Difficulty of Paying Living Expenses: Not hard at all  Food Insecurity: No Food Insecurity   Worried About Charity fundraiser in the Last Year: Never true   [place] in the Last Year: Never true  Transportation Needs: No Transportation Needs   Lack of Transportation (Medical): No   Lack of Transportation (Non-Medical): No  Physical Activity: Sufficiently Active   Days of Exercise per Week: 7 days   Minutes of Exercise per Session: 120 min  Stress: No Stress Concern Present   Feeling of Stress : Not at all  Social Connections: Moderately Isolated   Frequency of Communication with Friends and Family: More than three times a week   Frequency of Social Gatherings with Friends and Family: More than three times a week   Attends Religious Services: Never   Marine scientist or Organizations: Yes    Attends Archivist Meetings: 1 to 4 times per year   Marital Status: Separated    Tobacco Counseling Counseling given: Not Answered   Clinical Intake:  Pre-visit preparation completed: Yes  Pain : No/denies pain     BMI - recorded: 24.75 Nutritional Status: BMI of 19-24  Normal Nutritional Risks: None Diabetes: No  How often do you need to have someone help you when you read instructions, pamphlets, or other written materials from your doctor or pharmacy?: 1 - Never  Diabetic?no  Interpreter Needed?: No  Information entered by :: Charlott Rakes, LPN   Activities of Daily Living In your present state of health, do you have any difficulty performing the following activities: 09/03/2021  Hearing? N  Vision? N  Difficulty concentrating or making  decisions? N  Walking or climbing stairs? N  Dressing or bathing? N  Doing errands, shopping? N  Preparing Food and eating ? N  Using the Toilet? N  In the past six months, have you accidently leaked urine? N  Do you have problems with loss of bowel control? N  Managing your Medications? N  Managing your Finances? N  Housekeeping or managing your Housekeeping? N  Some recent data might be hidden    Patient Care Team: [name], MD as PCP - General (Family Medicine) [name], MD as PCP - Cardiology (Cardiology) Thompson Grayer, MD as PCP - Electrophysiology (Cardiology) Thompson Grayer, MD as Consulting Physician (Cardiology) Garvin Fila, MD as Consulting Physician (Neurology)  Indicate any recent Medical Services you may have received from other than [facility] providers in the past year (date may be approximate).     Assessment:   This is a routine wellness examination for William Underwood.  Hearing/Vision screen Hearing Screening - Comments:: Pt denies any hearing issue  Vision Screening - Comments:: Pt follows up with Dr Valetta Close for annual eye exams   Dietary issues and exercise activities  discussed: Current Exercise Habits: Home exercise routine, Type of exercise: walking;Other - see comments (biking, running), Time (Minutes): > 60, Frequency (Times/Week): 7, Weekly Exercise (Minutes/Week): 0   Goals Addressed             This Visit's Progress    Patient Stated       None at this time       Depression Screen PHQ 2/9 Scores 09/03/2021 09/02/2021 08/30/2020 07/14/2019 08/23/2018 01/29/2017 10/30/2016  PHQ - 2 Score 0 0 4 0 0 0 0  PHQ- 9 Score - - 8 6 - - -  Exception Documentation - - - - - Other- indicate reason in comment box Other- indicate reason in comment box    Fall Risk Fall Risk  09/03/2021 09/02/2021 08/30/2020 08/26/2019 08/26/2019  Falls in the past year? 0 0 0 0 0  Number falls in past yr: 0 0 0 - -  Injury with Fall? 0 0 0 - -  Risk for fall due to : Impaired vision No Fall Risks - - -  Follow up Falls prevention discussed - - - -    FALL RISK PREVENTION PERTAINING TO THE HOME:  Any stairs in or around the home? Yes  If so, are there any without handrails? No  Home free of loose throw rugs in walkways, pet beds, electrical cords, etc? Yes  Adequate lighting in your home to reduce risk of falls? Yes   ASSISTIVE DEVICES UTILIZED TO PREVENT FALLS:  Life alert? No  Use of a cane, walker or w/c? No  Grab bars in the bathroom? No  Shower chair or bench in shower? No  Elevated toilet seat or a handicapped toilet? No   TIMED UP AND GO:  Was the test performed? No .   Cognitive Function:     6CIT Screen 09/03/2021  What Year? 0 points  What month? 0 points  What time? 0 points  Count back from 20 0 points  Months in reverse 0 points  Repeat phrase 0 points  Total Score 0    Immunizations Immunization History  Administered Date(s) Administered   Fluad Quad(high Dose 65+) 07/09/2019, 08/09/2021   Influenza Split 07/27/2018   Influenza Whole 07/23/2018   Influenza, High Dose Seasonal PF 07/13/2017, 07/23/2018   Influenza, Seasonal, Injecte,  Preservative Fre 08/01/2014   Influenza,inj,Quad PF,6+  Mos 08/13/2015   Influenza-Unspecified 08/09/2021   PFIZER(Purple Top)SARS-COV-2 Vaccination 11/19/2019, 12/10/2019, 08/07/2020   Pfizer Covid-19 Vaccine Bivalent Booster 80yrs & up 08/12/2021   Pneumococcal Conjugate-13 08/23/2018   Pneumococcal Polysaccharide-23 07/13/2016, 07/27/2018   Pneumococcal-Unspecified 10/27/2011   Zoster Recombinat (Shingrix) 08/26/2019, 11/07/2019   Zoster, Live 10/27/2011    TDAP status: Due, Education has been provided regarding the importance of this vaccine. Advised may receive this vaccine at local pharmacy or Health Dept. Aware to provide a copy of the vaccination record if obtained from local pharmacy or Health Dept. Verbalized acceptance and understanding.  Flu Vaccine status: Up to date  Pneumococcal vaccine status: Up to date  Covid-19 vaccine status: Completed vaccines  Qualifies for Shingles Vaccine? Yes   Zostavax completed Yes   Shingrix Completed?: Yes  Screening Tests Health Maintenance  Topic Date Due   TETANUS/TDAP  Never done   COLONOSCOPY (Pts 45-51yrs Insurance coverage will need to be confirmed)  03/12/2026   Pneumonia Vaccine 58+ Years old  Completed   INFLUENZA VACCINE  Completed   COVID-19 Vaccine  Completed   Hepatitis C Screening  Completed   Zoster Vaccines- Shingrix  Completed   HPV VACCINES  Aged Out    Health Maintenance  Health Maintenance Due  Topic Date Due   TETANUS/TDAP  Never done    Colorectal cancer screening: Type of screening: Colonoscopy. Completed 03/12/16. Repeat every 10 years  Additional Screening:  Hepatitis C Screening:  Completed 07/2818  Vision Screening: Recommended annual ophthalmology exams for early detection of glaucoma and other disorders of the eye. Is the patient up to date with their annual eye exam?  Yes  Who is the provider or what is the name of the office in which the patient attends annual eye exams? Dr Valetta Close  If pt is  not established with a provider, would they like to be referred to a provider to establish care? No .   Dental Screening: Recommended annual dental exams for proper oral hygiene  Community Resource Referral / Chronic Care Management: CRR required this visit?  No   CCM required this visit?  No      Plan:     I have personally reviewed and noted the following in the patient's chart:   Medical and social history Use of alcohol, tobacco or illicit drugs  Current medications and supplements including opioid prescriptions. Patient is currently taking opioid prescriptions. Information provided to patient regarding non-opioid alternatives. Patient advised to discuss non-opioid treatment plan with their provider. Functional ability and status Nutritional status Physical activity Advanced directives List of other physicians Hospitalizations, surgeries, and ER visits in previous 12 months Vitals Screenings to include cognitive, depression, and falls Referrals and appointments  In addition, I have reviewed and discussed with patient certain preventive protocols, quality metrics, and best practice recommendations. A written personalized care plan for preventive services as well as general preventive health recommendations were provided to patient.     Willette Brace, LPN   29/02/2840   Nurse Notes: None

## 2021-09-03 NOTE — Patient Instructions (Addendum)
William Underwood , Thank you for taking time to come for your Medicare Wellness Visit. I appreciate your ongoing commitment to your health goals. Please review the following plan we discussed and let me know if I can assist you in the future.   Screening recommendations/referrals: Colonoscopy: Done 03/12/16 repeat every 10 years  Recommended yearly ophthalmology/optometry visit for glaucoma screening and checkup Recommended yearly dental visit for hygiene and checkup  Vaccinations: Influenza vaccine: Done 08/09/21 Pneumococcal vaccine: Up to date Tdap vaccine: Due Shingles vaccine: Completed 08/26/19 & 11/07/19   Covid-19: Completed 1/23, 2/13, 08/07/20 & 08/12/21  Advanced directives: Please bring a copy of your health care power of attorney and living will to the office at your convenience.  Conditions/risks identified: None at this time  Next appointment: Follow up in one year for your annual wellness visit.   Preventive Care 71 Years and Older, Male Preventive care refers to lifestyle choices and visits with your health care provider that can promote health and wellness. What does preventive care include? A yearly physical exam. This is also called an annual well check. Dental exams once or twice a year. Routine eye exams. Ask your health care provider how often you should have your eyes checked. Personal lifestyle choices, including: Daily care of your teeth and gums. Regular physical activity. Eating a healthy diet. Avoiding tobacco and drug use. Limiting alcohol use. Practicing safe sex. Taking low doses of aspirin every day. Taking vitamin and mineral supplements as recommended by your health care provider. What happens during an annual well check? The services and screenings done by your health care provider during your annual well check will depend on your age, overall health, lifestyle risk factors, and family history of disease. Counseling  Your health care provider may ask  you questions about your: Alcohol use. Tobacco use. Drug use. Emotional well-being. Home and relationship well-being. Sexual activity. Eating habits. History of falls. Memory and ability to understand (cognition). Work and work Statistician. Screening  You may have the following tests or measurements: Height, weight, and BMI. Blood pressure. Lipid and cholesterol levels. These may be checked every 5 years, or more frequently if you are over 71 years old. Skin check. Lung cancer screening. You may have this screening every year starting at age 71 if you have a 30-pack-year history of smoking and currently smoke or have quit within the past 15 years. Fecal occult blood test (FOBT) of the stool. You may have this test every year starting at age 71. Flexible sigmoidoscopy or colonoscopy. You may have a sigmoidoscopy every 5 years or a colonoscopy every 10 years starting at age 71. Prostate cancer screening. Recommendations will vary depending on your family history and other risks. Hepatitis C blood test. Hepatitis B blood test. Sexually transmitted disease (STD) testing. Diabetes screening. This is done by checking your blood sugar (glucose) after you have not eaten for a while (fasting). You may have this done every 1-3 years. Abdominal aortic aneurysm (AAA) screening. You may need this if you are a current or former smoker. Osteoporosis. You may be screened starting at age 22 if you are at high risk. Talk with your health care provider about your test results, treatment options, and if necessary, the need for more tests. Vaccines  Your health care provider may recommend certain vaccines, such as: Influenza vaccine. This is recommended every year. Tetanus, diphtheria, and acellular pertussis (Tdap, Td) vaccine. You may need a Td booster every 10 years. Zoster vaccine. You may need  this after age 71. Pneumococcal 13-valent conjugate (PCV13) vaccine. One dose is recommended after age  71. Pneumococcal polysaccharide (PPSV23) vaccine. One dose is recommended after age 71. Talk to your health care provider about which screenings and vaccines you need and how often you need them. This information is not intended to replace advice given to you by your health care provider. Make sure you discuss any questions you have with your health care provider. Document Released: 11/09/2015 Document Revised: 07/02/2016 Document Reviewed: 08/14/2015 Elsevier Interactive Patient Education  2017 [place] Prevention in the Home Falls can cause injuries. They can happen to people of all ages. There are many things you can do to make your home safe and to help prevent falls. What can I do on the outside of my home? Regularly fix the edges of walkways and driveways and fix any cracks. Remove anything that might make you trip as you walk through a door, such as a raised step or threshold. Trim any bushes or trees on the path to your home. Use bright outdoor lighting. Clear any walking paths of anything that might make someone trip, such as rocks or tools. Regularly check to see if handrails are loose or broken. Make sure that both sides of any steps have handrails. Any raised decks and porches should have guardrails on the edges. Have any leaves, snow, or ice cleared regularly. Use sand or salt on walking paths during winter. Clean up any spills in your garage right away. This includes oil or grease spills. What can I do in the bathroom? Use night lights. Install grab bars by the toilet and in the tub and shower. Do not use towel bars as grab bars. Use non-skid mats or decals in the tub or shower. If you need to sit down in the shower, use a plastic, non-slip stool. Keep the floor dry. Clean up any water that spills on the floor as soon as it happens. Remove soap buildup in the tub or shower regularly. Attach bath mats securely with double-sided non-slip rug tape. Do not have throw  rugs and other things on the floor that can make you trip. What can I do in the bedroom? Use night lights. Make sure that you have a light by your bed that is easy to reach. Do not use any sheets or blankets that are too big for your bed. They should not hang down onto the floor. Have a firm chair that has side arms. You can use this for support while you get dressed. Do not have throw rugs and other things on the floor that can make you trip. What can I do in the kitchen? Clean up any spills right away. Avoid walking on wet floors. Keep items that you use a lot in easy-to-reach places. If you need to reach something above you, use a strong step stool that has a grab bar. Keep electrical cords out of the way. Do not use floor polish or wax that makes floors slippery. If you must use wax, use non-skid floor wax. Do not have throw rugs and other things on the floor that can make you trip. What can I do with my stairs? Do not leave any items on the stairs. Make sure that there are handrails on both sides of the stairs and use them. Fix handrails that are broken or loose. Make sure that handrails are as long as the stairways. Check any carpeting to make sure that it is firmly attached to  the stairs. Fix any carpet that is loose or worn. Avoid having throw rugs at the top or bottom of the stairs. If you do have throw rugs, attach them to the floor with carpet tape. Make sure that you have a light switch at the top of the stairs and the bottom of the stairs. If you do not have them, ask someone to add them for you. What else can I do to help prevent falls? Wear shoes that: Do not have high heels. Have rubber bottoms. Are comfortable and fit you well. Are closed at the toe. Do not wear sandals. If you use a stepladder: Make sure that it is fully opened. Do not climb a closed stepladder. Make sure that both sides of the stepladder are locked into place. Ask someone to hold it for you, if  possible. Clearly mark and make sure that you can see: Any grab bars or handrails. First and last steps. Where the edge of each step is. Use tools that help you move around (mobility aids) if they are needed. These include: Canes. Walkers. Scooters. Crutches. Turn on the lights when you go into a dark area. Replace any light bulbs as soon as they burn out. Set up your furniture so you have a clear path. Avoid moving your furniture around. If any of your floors are uneven, fix them. If there are any pets around you, be aware of where they are. Review your medicines with your doctor. Some medicines can make you feel dizzy. This can increase your chance of falling. Ask your doctor what other things that you can do to help prevent falls. This information is not intended to replace advice given to you by your health care provider. Make sure you discuss any questions you have with your health care provider. Document Released: 08/09/2009 Document Revised: 03/20/2016 Document Reviewed: 11/17/2014 Elsevier Interactive Patient Education  2017 [employer].

## 2021-09-04 DIAGNOSIS — M6281 Muscle weakness (generalized): Secondary | ICD-10-CM | POA: Diagnosis not present

## 2021-09-04 DIAGNOSIS — M25612 Stiffness of left shoulder, not elsewhere classified: Secondary | ICD-10-CM | POA: Diagnosis not present

## 2021-09-09 DIAGNOSIS — M25612 Stiffness of left shoulder, not elsewhere classified: Secondary | ICD-10-CM | POA: Diagnosis not present

## 2021-09-09 DIAGNOSIS — M6281 Muscle weakness (generalized): Secondary | ICD-10-CM | POA: Diagnosis not present

## 2021-09-11 DIAGNOSIS — M25612 Stiffness of left shoulder, not elsewhere classified: Secondary | ICD-10-CM | POA: Diagnosis not present

## 2021-09-11 DIAGNOSIS — M6281 Muscle weakness (generalized): Secondary | ICD-10-CM | POA: Diagnosis not present

## 2021-09-25 ENCOUNTER — Telehealth (INDEPENDENT_AMBULATORY_CARE_PROVIDER_SITE_OTHER): Payer: PPO | Admitting: Family Medicine

## 2021-09-25 ENCOUNTER — Encounter: Payer: Self-pay | Admitting: Family Medicine

## 2021-09-25 VITALS — Temp 97.8°F

## 2021-09-25 DIAGNOSIS — U071 COVID-19: Secondary | ICD-10-CM | POA: Diagnosis not present

## 2021-09-25 MED ORDER — MOLNUPIRAVIR EUA 200MG CAPSULE: 4.0000 | ORAL_CAPSULE | Freq: Two times a day (BID) | ORAL | 0 refills | Status: AC

## 2021-09-25 NOTE — Telephone Encounter (Signed)
Patient is scheduled   

## 2021-09-25 NOTE — Patient Instructions (Addendum)
Good talking to you today. Sorry to hear you are sick, but glad your symptoms are mild. I did write the antiviral medication to start within 5 days of symptoms if you choose to take that medication. Vitamin C, zinc over the counter may help.  Make sure to drink plenty of fluids, rest.  If any shortness of breath at rest, chest pains or acute worsening symptoms I do recommend evaluation as we discussed.  See information below on masking and quarantine.  Hope you feel better soon, but please let me know if there are concerns/questions.   Everyone who has presumed or confirmed COVID-19 should stay home and isolate from other people for at least 5 full days (day 0 is the first day of symptoms or the date of the day of the positive viral test for asymptomatic persons). You can end isolation after 5 full days if you are fever-free for 24 hours without the use of fever-reducing medication and your other symptoms have improved (Loss of taste and smell may persist for weeks or months after recovery and need not delay the end of isolation). You should continue to wear a well-fitting mask around others at home and in public for 5 additional days (day 6 through day 10) after the end of your 5-day isolation period. If you are unable to wear a mask when around others, you should continue to isolate for a full 10 days. Avoid people who have weakened immune systems or are more likely to get very sick from COVID-19, and nursing homes and other high-risk settings, until after at least 10 days.  If you continue to have fever or your other symptoms have not improved after 5 days of isolation, you should wait to end your isolation until you are fever-free for 24 hours without the use of fever-reducing medication and your other symptoms have improved. Continue to wear a well-fitting mask through day 10.   [email or web address].[email or web address]

## 2021-09-25 NOTE — Progress Notes (Signed)
Virtual Visit via Video Note  I connected with William Underwood on 09/25/21 at 11:05 AM by a video enabled telemedicine application and verified that I am speaking with the correct person using two identifiers.  Patient location: home, by self.  My location: office -[place].    I discussed the limitations, risks, security and privacy concerns of performing an evaluation and management service by telephone and the availability of in person appointments. I also discussed with the patient that there may be a patient responsible charge related to this service. The patient expressed understanding and agreed to proceed, consent obtained  Chief complaint:  Chief Complaint  Patient presents with   Covid Positive    Pt reports positive home test  this morning, sxs starting yesterday early afternoon, congestion, headache, sore throat, pt reports has had COVID boosters     History of Present Illness: William Underwood is a 70 y.o. male  COVID-19 infection Initial symptoms started yesterday early afternoon with scratchy throat, worse last night. Now with head congestion, slight headache, sore throat. Minimal symptoms.  Positive testing, this morning, home test. Around grandkids past weekend, rode [employer].  No dyspnea, chest pain, confusion, or difficulty drinking fluids. No chest symptoms.  Tx: tylenol.   COVID risk of complications score of 3.  He has received COVID-vaccine as well as booster, most recent bivalent booster October 17.  No prior covid infection.   He does take apixaban with history of atrial fibrillation, atorvastatin with history of dyslipidemia and aortic atherosclerosis, prior stroke, history of pulmonary vein stenosis.   Patient Active Problem List   Diagnosis Date Noted   Dyslipidemia 09/02/2021   Aortic atherosclerosis ([place]) 06/13/2021   Stroke ([place]) 06/22/2020   Arthralgia 07/14/2019   Pulmonary vein stenosis s/p stenting 2020 01/02/2019   Chronic neck pain  08/23/2018   Tinnitus 08/23/2018   Lipoma 08/23/2018   Paroxysmal atrial fibrillation ([place]) 05/25/2018   Diverticulosis 06/20/2017   Hemorrhoids, external 06/20/2017   Benign prostatic hyperplasia without urinary obstruction 06/17/2014   BPPV (benign paroxysmal positional vertigo) 07/01/2011   DEGENERATIVE JOINT DISEASE, HANDS 10/16/2009   NEUROPATHY 03/01/2008   Past Medical History:  Diagnosis Date   Benign prostatic hyperplasia without urinary obstruction 06/17/2014   BPPV (benign paroxysmal positional vertigo) 07/01/2011   Chronic hyperkalemia 05/20/2012   Overview:  Hx of for years   Concussion syndrome 05/15/2011   This seems like a moderately severe concussion with at least 1 minute loss of consciousness in 15-20 minutes of significant confusion including being unable to state his name    DEGENERATIVE JOINT DISEASE, HANDS 10/16/2009   Qualifier: Diagnosis of  By: [name], [name]     Diverticulosis 06/20/2017   Hemorrhoids, external 06/20/2017   Lumbar spondylosis 06/20/2017   Fairly significant L4-L5 facet arthropathy with effusion.  Can consider facet injections and potentially RFA down the road.   NEUROPATHY 03/01/2008   Qualifier: Diagnosis of  By: [name], [name]     Paroxysmal atrial fibrillation ([place])    Pulmonary insufficiency 03/09/2018   moderate PI noted on echo 03/09/18   Pulmonary vein stenosis    left inferior pulmonary vein stenosis   Right inguinal hernia 06/17/2014   Sinus bradycardia    Strain of gluteus medius 01/29/2017   Stroke ([place]) 05/2020   Vertigo    Past Surgical History:  Procedure Laterality Date   ANKLE ARTHROSCOPY     ATRIAL FIBRILLATION ABLATION N/A 05/25/2018   Procedure: ATRIAL FIBRILLATION ABLATION;  Surgeon:  Thompson Grayer, MD;  Location: [facility];  Service: Cardiovascular;  Laterality: N/A;   KNEE ARTHROSCOPY     ROTATOR CUFF REPAIR Left 06/24/2021   Allergies  Allergen Reactions   Cefdinir Palpitations and Rash   Codeine Other  (See Comments)    Sensitive to this = agitation   Prior to Admission medications   Medication Sig Start Date End Date Taking? Authorizing Provider  apixaban (ELIQUIS) 5 MG TABS tablet Take 1 tablet (5 mg total) by mouth 2 (two) times daily. 07/23/21  Yes [name], MD  atorvastatin (LIPITOR) 80 MG tablet Take 1 tablet (80 mg total) by mouth daily. 07/23/21  Yes [name], MD  diazepam (VALIUM) 5 MG tablet Take 1 tablet (5 mg total) by mouth daily as needed for anxiety. 09/02/21  Yes [name], MD  FLUAD QUADRIVALENT 0.5 ML injection  08/09/21  Yes [provider]  Multiple Vitamins-Minerals (MULTIVITAMIN WITH MINERALS) tablet Take 1 tablet by mouth daily.    Yes [provider]  PFIZER COVID-19 VAC BIVALENT injection  08/12/21  Yes [provider]  tamsulosin (FLOMAX) 0.4 MG CAPS capsule Take 1 capsule (0.4 mg total) by mouth daily. 01/18/21  Yes Allwardt, Alyssa M, PA-C  traMADol (ULTRAM) 50 MG tablet Take 1 tablet (50 mg total) by mouth every 6 (six) hours as needed for moderate pain. 09/02/21  Yes [name], MD  Vitamin D, Ergocalciferol, 2000 units CAPS Take 2,000 Units by mouth daily.    Yes [provider]   Social History   Socioeconomic History   Marital status: Legally Separated    Spouse name: Not on file   Number of children: Not on file   Years of education: Not on file   Highest education level: Not on file  Occupational History   Occupation: retired  Tobacco Use   Smoking status: Never   Smokeless tobacco: Never  Vaping Use   Vaping Use: Never used  Substance and Sexual Activity   Alcohol use: Yes    Comment: Occasionally   Drug use: No   Sexual activity: Not Currently  Other Topics Concern   Not on file  Social History Narrative   Lives alone   Right Handed   Drinks 3-4 cups caffeine daily   Social Determinants of Health   Financial Resource Strain: Low Risk    Difficulty of Paying Living  Expenses: Not hard at all  Food Insecurity: No Food Insecurity   Worried About Charity fundraiser in the Last Year: Never true   [place] in the Last Year: Never true  Transportation Needs: No Transportation Needs   Lack of Transportation (Medical): No   Lack of Transportation (Non-Medical): No  Physical Activity: Sufficiently Active   Days of Exercise per Week: 7 days   Minutes of Exercise per Session: 120 min  Stress: No Stress Concern Present   Feeling of Stress : Not at all  Social Connections: Moderately Isolated   Frequency of Communication with Friends and Family: More than three times a week   Frequency of Social Gatherings with Friends and Family: More than three times a week   Attends Religious Services: Never   Marine scientist or Organizations: Yes   Attends Archivist Meetings: 1 to 4 times per year   Marital Status: Separated  Intimate Partner Violence: Not At Risk   Fear of Current or Ex-Partner: No   Emotionally Abused: No  Physically Abused: No   Sexually Abused: No    Observations/Objective: Today's Vitals   09/25/21 1014  Temp: 97.8 F (36.6 C)   There is no height or weight on file to calculate BMI. Nontoxic appearance on video, speaking in full sentences, euthymic mood.  No respiratory distress.  No appreciable cough noted during video exam.  All questions were answered with understanding of plan expressed.  Assessment and Plan: COVID-19 virus infection - Plan: molnupiravir EUA (LAGEVRIO) 200 mg CAPS capsule Mild symptoms at present.  No red flags on history/exam.  He has been vaccinated and recent booster.  With his medical history we did discuss antivirals, and with use of apixaban molnupiravir was prescribed.  He is considering whether or not to fill it based on his mild symptoms today.  Timing of start of medication discussed.  Risk of rebound COVID and side effects of medication discussed.  Symptomatic care discussed with vitamin  C, zinc, fluids, rest, Mucinex, Tylenol, urgent care/ER precautions given.  Quarantine/masking guidelines per CDC discussed.  Follow Up Instructions: As needed, as above   I discussed the assessment and treatment plan with the patient. The patient was provided an opportunity to ask questions and all were answered. The patient agreed with the plan and demonstrated an understanding of the instructions.   The patient was advised to call back or seek an in-person evaluation if the symptoms worsen or if the condition fails to improve as anticipated.   Wendie Agreste, MD

## 2021-10-01 DIAGNOSIS — M25612 Stiffness of left shoulder, not elsewhere classified: Secondary | ICD-10-CM | POA: Diagnosis not present

## 2021-10-01 DIAGNOSIS — M6281 Muscle weakness (generalized): Secondary | ICD-10-CM | POA: Diagnosis not present

## 2021-10-03 DIAGNOSIS — M25612 Stiffness of left shoulder, not elsewhere classified: Secondary | ICD-10-CM | POA: Diagnosis not present

## 2021-10-03 DIAGNOSIS — M6281 Muscle weakness (generalized): Secondary | ICD-10-CM | POA: Diagnosis not present

## 2021-10-04 ENCOUNTER — Ambulatory Visit: Payer: PPO | Admitting: Internal Medicine

## 2021-10-07 DIAGNOSIS — M25612 Stiffness of left shoulder, not elsewhere classified: Secondary | ICD-10-CM | POA: Diagnosis not present

## 2021-10-07 DIAGNOSIS — M6281 Muscle weakness (generalized): Secondary | ICD-10-CM | POA: Diagnosis not present

## 2021-10-10 DIAGNOSIS — M6281 Muscle weakness (generalized): Secondary | ICD-10-CM | POA: Diagnosis not present

## 2021-10-10 DIAGNOSIS — M25612 Stiffness of left shoulder, not elsewhere classified: Secondary | ICD-10-CM | POA: Diagnosis not present

## 2021-10-15 ENCOUNTER — Encounter: Payer: Self-pay | Admitting: Family Medicine

## 2021-10-15 DIAGNOSIS — M6281 Muscle weakness (generalized): Secondary | ICD-10-CM | POA: Diagnosis not present

## 2021-10-15 DIAGNOSIS — M25612 Stiffness of left shoulder, not elsewhere classified: Secondary | ICD-10-CM | POA: Diagnosis not present

## 2021-10-16 MED ORDER — TAMSULOSIN HCL 0.4 MG PO CAPS: 0.4000 mg | ORAL_CAPSULE | Freq: Every day | ORAL | 3 refills | Status: DC

## 2021-10-23 DIAGNOSIS — M6281 Muscle weakness (generalized): Secondary | ICD-10-CM | POA: Diagnosis not present

## 2021-10-23 DIAGNOSIS — M25612 Stiffness of left shoulder, not elsewhere classified: Secondary | ICD-10-CM | POA: Diagnosis not present

## 2021-11-07 DIAGNOSIS — M25562 Pain in left knee: Secondary | ICD-10-CM | POA: Diagnosis not present

## 2021-11-12 DIAGNOSIS — M25562 Pain in left knee: Secondary | ICD-10-CM | POA: Diagnosis not present

## 2021-11-14 DIAGNOSIS — S83282A Other tear of lateral meniscus, current injury, left knee, initial encounter: Secondary | ICD-10-CM | POA: Diagnosis not present

## 2021-11-14 DIAGNOSIS — S83242A Other tear of medial meniscus, current injury, left knee, initial encounter: Secondary | ICD-10-CM | POA: Diagnosis not present

## 2021-11-18 ENCOUNTER — Telehealth: Payer: Self-pay | Admitting: *Deleted

## 2021-11-18 NOTE — Telephone Encounter (Signed)
° °  Pre-operative Risk Assessment    Patient Name: William Underwood  DOB: 1951-07-06 MRN: [id number]      Request for Surgical Clearance    Procedure:   LEFT KNEE ARTHROSCOPY WITH MEDIAL & LATERAL PARTIAL MENISCECTOMY   Date of Surgery:  Clearance 12/02/21                                 Surgeon:  DR Rosiland Oz GRAVES Surgeon's Group or Practice Name:  [facility] Phone number:  [phone] Fax number:  [phone]   Type of Clearance Requested:   - Medical  - Pharmacy:  Hold Apixaban (Eliquis)     Type of Anesthesia:  Not Indicated   Additional requests/questions:  Please advise surgeon/provider what medications should be held.  Signed, Alvina Filbert   11/18/2021, 7:06 PM

## 2021-11-19 NOTE — Telephone Encounter (Signed)
Patient with diagnosis of afib on Eliquis for anticoagulation.    Procedure: LEFT KNEE ARTHROSCOPY WITH MEDIAL & LATERAL PARTIAL MENISCECTOMY  Date of procedure: 12/02/21  CHA2DS2-VASc Score = 4  This indicates a 4.8% annual risk of stroke. The patient's score is based upon: CHF History: 0 HTN History: 0 Diabetes History: 0 Stroke History: 2 Vascular Disease History: 1 Age Score: 1 Gender Score: 0   CrCl 41mL/min Platelet count 262M  Prior embolic stroke in August 2021. Pt previously cleared to hold anticoag for > 24 hours with Lovenox bridge last fall due to increased CV risk.  Will need to clarify how long MD performing procedure wants pt off of anticoagulation. If > 24 hour hold is needed, he will require bridging with Lovenox again.

## 2021-11-19 NOTE — Telephone Encounter (Signed)
Left message for Dr Berenice Primas office to call us back and provide length of time they are requesting that pt hold anticoagulant for procedure.

## 2021-11-19 NOTE — Telephone Encounter (Signed)
Spoke with patient and scheduled him to see Laurann Montana, NP on 11/26/21 at 8:50 AM. He is aware that this is at the [facility] location.

## 2021-11-19 NOTE — Telephone Encounter (Signed)
° °  Name: ANANTH FIALLOS  DOB: 1950-12-05  MRN: [id number]  Primary Cardiologist: [name], MD  Chart reviewed as part of pre-operative protocol coverage. Because of HARISH BRAM past medical history and time since last visit, he will require a follow-up visit in order to better assess preoperative cardiovascular risk.  Pre-op covering staff: - Please schedule appointment and call patient to inform them. If patient already had an upcoming appointment within acceptable timeframe, please add "pre-op clearance" to the appointment notes so provider is aware. - Please contact requesting surgeon's office via preferred method (i.e, phone, fax) to inform them of need for appointment prior to surgery.  If applicable, this message will also be routed to pharmacy pool and/or primary cardiologist for input on holding anticoagulant/antiplatelet agent as requested below so that this information is available to the clearing provider at time of patient's appointment.   Tami Lin Youa Deloney, PA  11/19/2021, 9:03 AM

## 2021-11-20 NOTE — Telephone Encounter (Signed)
Left detailed message to that we will need to know hold time Dr. Berenice Primas would prefer:   Notes from Pharm-D Jinny Blossom Supple: Prior embolic stroke in August 2021. Pt previously cleared to hold anticoag for > 24 hours with Lovenox bridge last fall due to increased CV risk.   Will need to clarify how long MD performing procedure wants pt off of anticoagulation. If > 24 hour hold is needed, he will require bridging with Lovenox again.

## 2021-11-20 NOTE — Telephone Encounter (Signed)
I will forward this to [facility] for upcoming appt as well.

## 2021-11-21 ENCOUNTER — Telehealth: Payer: Self-pay | Admitting: Internal Medicine

## 2021-11-21 NOTE — Telephone Encounter (Signed)
I will fax note to requesting office the pt has appt 11/26/21 for pre op assessment at that time. Once the pt has been cleared we will be sure to fax clearance notes and recommendations if any.

## 2021-11-21 NOTE — Telephone Encounter (Signed)
[facility] called and stated that they was returning phone call regarding the patient's medical clearance

## 2021-11-25 NOTE — Progress Notes (Signed)
Cardiology Office Note:    Date:  11/26/2021   ID:  William Underwood, DOB December 26, 1950, MRN [id number]  PCP:  William Barrack, MD   [facility] Providers Cardiologist:  [name], MD Electrophysiologist:  Thompson Grayer, MD     Referring MD: William Barrack, MD   Chief Complaint: preoperative cardiac evaluation  History of Present Illness:    William Underwood is a 71 y.o. male with a hx of PAF, history of a fib ablation, stroke, aortic atherosclerosis  He was last seen by Dr. Margaretann Loveless on 08/24/20 and was advised to follow-up in 6 months.  He is followed at [facility] clinic for pulmonary vein stenosis following A. fib ablation. Catheterization in April 2020 revealed total occlusion of superior branch of LLPV and sever proximal stenosis of the inferior branch of the LLPV with successful stenting to these areas. In June 2020, occlusion was found in small upper 5 mm stent draining a small superior segment of the left lower lobe. He had no limitation on CPX 5/21. He was transitioned off rivaroxaban after 12 months and was taking 162 mg of aspirin only. Unfortunately, he suffered a stroke in April 2021 that had multiple infarcts that caused double vision, right sided weakness, and expressive aphasia. He was treated with TPA. He now is on apixaban only. At his April 2022 follow-up at [facility], he reported some exercise limitations and concerns about low SPO2 levels. He had been to the ER for a 10 minute episode of dysequilibrium while riding his bike. Head CT and MRI and labs were all unremarkable and he was discharged home on the same day with plan for neurology follow-up. At [place] visit, he was advised to stop his anticoagulation and re-evaluate stent patency in 12 months with CT PV and lung perfusion.   He is here for preoperative cardiac evaluation for upcoming left knee surgery.  He reports he is feeling great and is exercising on a regular basis. He is not aware of any reoccurrence of  atrial fibrillation.  He denies chest pain, shortness of breath, lower extremity edema, fatigue, palpitations, melena, hematuria, hemoptysis, diaphoresis, weakness, presyncope, syncope, orthopnea, and PND. He has no specific cardiac concerns today. According to the Revised Cardiac Risk Index (RCRI), his Perioperative Risk of Major Cardiac Event is (%): 0.9.  His Functional Capacity in METs is: 7.59 according to the [facility] Activity Status Index (DASI).   Past Medical History:  Diagnosis Date   Benign prostatic hyperplasia without urinary obstruction 06/17/2014   BPPV (benign paroxysmal positional vertigo) 07/01/2011   Chronic hyperkalemia 05/20/2012   Overview:  Hx of for years   Concussion syndrome 05/15/2011   This seems like a moderately severe concussion with at least 1 minute loss of consciousness in 15-20 minutes of significant confusion including being unable to state his name    DEGENERATIVE JOINT DISEASE, HANDS 10/16/2009   Qualifier: Diagnosis of  By: [name], [name]     Diverticulosis 06/20/2017   Hemorrhoids, external 06/20/2017   Lumbar spondylosis 06/20/2017   Fairly significant L4-L5 facet arthropathy with effusion.  Can consider facet injections and potentially RFA down the road.   NEUROPATHY 03/01/2008   Qualifier: Diagnosis of  By: [name], [name]     Paroxysmal atrial fibrillation ([place])    Pulmonary insufficiency 03/09/2018   moderate PI noted on echo 03/09/18   Pulmonary vein stenosis    left inferior pulmonary vein stenosis   Right inguinal hernia 06/17/2014   Sinus bradycardia  Strain of gluteus medius 01/29/2017   Stroke ([place]) 05/2020   Vertigo     Past Surgical History:  Procedure Laterality Date   ANKLE ARTHROSCOPY     ATRIAL FIBRILLATION ABLATION N/A 05/25/2018   Procedure: ATRIAL FIBRILLATION ABLATION;  Surgeon: Thompson Grayer, MD;  Location: [facility];  Service: Cardiovascular;  Laterality: N/A;   KNEE ARTHROSCOPY     ROTATOR CUFF REPAIR Left 06/24/2021     Current Medications: Current Meds  Medication Sig   atorvastatin (LIPITOR) 80 MG tablet Take 1 tablet (80 mg total) by mouth daily.   diazepam (VALIUM) 5 MG tablet Take 1 tablet (5 mg total) by mouth daily as needed for anxiety.   enoxaparin (LOVENOX) 80 MG/0.8ML injection Inject 0.8 mLs (80 mg total) into the skin every 12 (twelve) hours.   FLUAD QUADRIVALENT 0.5 ML injection    Multiple Vitamins-Minerals (MULTIVITAMIN WITH MINERALS) tablet Take 1 tablet by mouth daily.    PFIZER COVID-19 VAC BIVALENT injection    tamsulosin (FLOMAX) 0.4 MG CAPS capsule Take 1 capsule (0.4 mg total) by mouth daily.   traMADol (ULTRAM) 50 MG tablet Take 1 tablet (50 mg total) by mouth every 6 (six) hours as needed for moderate pain.   Vitamin D, Ergocalciferol, 2000 units CAPS Take 2,000 Units by mouth daily.    [DISCONTINUED] apixaban (ELIQUIS) 5 MG TABS tablet Take 1 tablet (5 mg total) by mouth 2 (two) times daily.     Allergies:   Cefdinir and Codeine   Social History   Socioeconomic History   Marital status: Legally Separated    Spouse name: Not on file   Number of children: Not on file   Years of education: Not on file   Highest education level: Not on file  Occupational History   Occupation: retired  Tobacco Use   Smoking status: Never   Smokeless tobacco: Never  Vaping Use   Vaping Use: Never used  Substance and Sexual Activity   Alcohol use: Yes    Comment: Occasionally   Drug use: No   Sexual activity: Not Currently  Other Topics Concern   Not on file  Social History Narrative   Lives alone   Right Handed   Drinks 3-4 cups caffeine daily   Social Determinants of Health   Financial Resource Strain: Low Risk    Difficulty of Paying Living Expenses: Not hard at all  Food Insecurity: No Food Insecurity   Worried About Charity fundraiser in the Last Year: Never true   [place] in the Last Year: Never true  Transportation Needs: No Transportation Needs   Lack of  Transportation (Medical): No   Lack of Transportation (Non-Medical): No  Physical Activity: Sufficiently Active   Days of Exercise per Week: 7 days   Minutes of Exercise per Session: 120 min  Stress: No Stress Concern Present   Feeling of Stress : Not at all  Social Connections: Moderately Isolated   Frequency of Communication with Friends and Family: More than three times a week   Frequency of Social Gatherings with Friends and Family: More than three times a week   Attends Religious Services: Never   Marine scientist or Organizations: Yes   Attends Archivist Meetings: 1 to 4 times per year   Marital Status: Separated     Family History: The patient's family history includes Hearing loss in his father. There is no history of Colon cancer.  ROS:   Please  see the history of present illness. All other systems reviewed and are negative.  Labs/Other Studies Reviewed:    The following studies were reviewed today:  Echo 8/21  Left Ventricle: Left ventricular ejection fraction, by estimation, is 60  to 65%. The left ventricle has normal function. The left ventricle has no  regional wall motion abnormalities. The left ventricular internal cavity  size was normal in size. There is  moderate left ventricular hypertrophy. Left ventricular diastolic  parameters are consistent with Grade I diastolic dysfunction (impaired  relaxation).  Right Ventricle: The right ventricular size is normal. No increase in  right ventricular wall thickness. Right ventricular systolic function is  normal.  Left Atrium: Left atrial size was normal in size.  Right Atrium: Right atrial size was mildly dilated.  Pericardium: There is no evidence of pericardial effusion.  Mitral Valve: The mitral valve is normal in structure. Normal mobility of  the mitral valve leaflets. Trivial mitral valve regurgitation. No evidence  of mitral valve stenosis.  Tricuspid Valve: The tricuspid valve is normal  in structure. Tricuspid  valve regurgitation is not demonstrated. No evidence of tricuspid  stenosis.  Aortic Valve: The aortic valve is normal in structure. Aortic valve  regurgitation is trivial. Mild aortic valve sclerosis is present, with no  evidence of aortic valve stenosis.  Pulmonic Valve: The pulmonic valve was normal in structure. Pulmonic valve  regurgitation is trivial. No evidence of pulmonic stenosis.  Aorta: The aortic root is normal in size and structure.  Venous: The inferior vena cava is normal in size with greater than 50%  respiratory variability, suggesting right atrial pressure of 3 mmHg.  IAS/Shunts: No atrial level shunt detected by color flow Doppler.    CPX 5/21  Attending: Agree with above. Excellent functional capacity. Ve/VCO2 slope is mildly elevated and likely reflects elevated pulmonary dead space due to increased pulmonary pressures with exercise but given low PETCO2 may also have a component of end-exercise hyperventilation. Can consider exercise testing with invasive monitoring if desired.   BP peaked during mid exercise at 188/56 but then apparently dropped back to baseline over the last few minutes of exercise. Unclear if this is real or artifactual. There were no accompanying symptoms.   There were not resting or exertional desaturations.   Glori Bickers, MD   Recent Labs: 09/02/2021: ALT 36; BUN 19; Creatinine, Ser 0.81; Hemoglobin 14.5; Platelets 192.0; Potassium 5.1; Sodium 144; TSH 1.04  Recent Lipid Panel    Component Value Date/Time   CHOL 111 09/02/2021 1006   TRIG 42.0 09/02/2021 1006   HDL 53.00 09/02/2021 1006   CHOLHDL 2 09/02/2021 1006   VLDL 8.4 09/02/2021 1006   LDLCALC 50 09/02/2021 1006   LDLCALC 49 08/30/2020 1006     Risk Assessment/Calculations:    CHA2DS2-VASc Score = 4   This indicates a 4.8% annual risk of stroke. The patient's score is based upon: CHF History: 0 HTN History: 0 Diabetes History: 0 Stroke  History: 2 Vascular Disease History: 1 Age Score: 1 Gender Score: 0      Physical Exam:    VS:  BP 130/80    Pulse (!) 54    Ht 5\' 7"  (1.702 m)    Wt 162 lb (73.5 kg)    SpO2 97%    BMI 25.37 kg/m     Wt Readings from Last 3 Encounters:  11/26/21 162 lb (73.5 kg)  09/02/21 157 lb (71.2 kg)  07/08/21 158 lb (71.7 kg)  GEN:  Well nourished, well developed in no acute distress HEENT: Normal NECK: No JVD; No carotid bruits CARDIAC: RRR, no murmurs, rubs, gallops RESPIRATORY:  Clear to auscultation without rales, wheezing or rhonchi  ABDOMEN: Soft, non-tender, non-distended MUSCULOSKELETAL:  No edema; No deformity. 2+ pedal pulses, equal bilaterally SKIN: Warm and dry NEUROLOGIC:  Alert and oriented x 3 PSYCHIATRIC:  Normal affect   EKG:  EKG is ordered today.  The ekg ordered today demonstrates Sinus bradycardia at 51 bpm, no acute change from previous tracing  Diagnoses:    1. Paroxysmal atrial fibrillation (HCC)   2. Pulmonary vein stenosis   3. S/P ablation of atrial fibrillation   4. Aortic atherosclerosis ([place])   5. Preoperative cardiovascular examination   6. History of stroke   7. Episodic lightheadedness   8. Chronic anticoagulation    Assessment and Plan:     Preoperative cardiac evaluation: He has upcoming knee surgery and is doing well from a cardiovascular perspective.  He continues to be very active daily with walking, running, and cycling and has no activity limitations.  Per surgeon's request, we will have him hold Eliquis for 3 days prior to surgery.  We will bridge him with Lovenox due to his history of stroke.  He can easily achieve greater than 4 METS activity on a consistent basis and RCRI score for MACE is 0.9%. He may proceed to surgery without further cardiac testing.   Episodic lightheadedness: He reports times of presyncope when getting up too quickly.  He has been managing this for many years and states it has been stable.  He admits that he does  not hydrate well and contributes the symptoms to dehydration, taking tamsulosin, and low HR and BP. He is aware of this and is careful when getting up from a sitting or laying position.  Encouraged him to monitor fluid intake and to consume at least 64 oz of water daily in addition to his other fluids on days when he is actively sweating. Encouraged him to notify us if these episodes increase in intensity or frequency.   PAF s/p ablation on chronic anticoagulation: He states he has had no reoccurrence of atrial fibrillation. No bleeding concerns. He has continued Eliquis but is having times where he is taking his dose late.  He asks about transitioning to Xarelto.  I discussed with our clinical Pharm D, Egbert Garibaldi, who advised that Eliquis has better profile against stroke. Patient agrees to continue Eliquis and will set a reminder for himself for consistent dosing.   Pulmonary vein stenosis: He is followed at [facility] clinic for pulmonary vein stenosis status post ablation.  He states he has been doing well and has been cleared to see them on a yearly basis.  He has no respiratory limitations with exercise and denies shortness of breath, orthopnea, PND.  Aortic atherosclerosis: He asks about continuing atorvastatin 80 mg daily due to low LDL cholesterol.   LDL 50 on 09/02/2021.  In the setting aortic atherosclerosis revealed on CT scans, I advised him to continue for now. Advised we can recheck lipids at next office visit for further discussion with Dr. Alveda Reasons in the future.   History of stroke: He feels that he has fully recovered from his stroke and has resumed normal activities with no limitations. He will continue Eliquis indefinitely. Management per neurology.    Disposition: 4 months with Dr. Margaretann Loveless    Medication Adjustments/Labs and Tests Ordered: Current medicines are reviewed at length with the patient  today.  Concerns regarding medicines are outlined above.  Orders Placed This  Encounter  Procedures   EKG 12-Lead   Meds ordered this encounter  Medications   enoxaparin (LOVENOX) 80 MG/0.8ML injection    Sig: Inject 0.8 mLs (80 mg total) into the skin every 12 (twelve) hours.    Dispense:  4 mL    Refill:  0    5 syringes   apixaban (ELIQUIS) 5 MG TABS tablet    Sig: Take 1 tablet (5 mg total) by mouth 2 (two) times daily.    Dispense:  180 tablet    Refill:  3    Patient Instructions  Medication Instructions:  Your Physician recommend you continue on your current medication as directed.    Last dose of Eliquis is February 2 in the evening.  Take Lovenox (enoxaparin) 80 mg injections twice daily (about 12 hours apart) on February 3 and 4.  On Feb 5 take 80 mg injection just in the morning. NO EVENING DOSE OF LOVENOX ON FEB 5.   Lab Work: None ordered today    Testing/Procedures: None ordered today    Follow-Up: At [facility], you and your health needs are our priority.  As part of our continuing mission to provide you with exceptional heart care, we have created designated Provider Care Teams.  These Care Teams include your primary Cardiologist (physician) and Advanced Practice Providers (APPs -  Physician Assistants and Nurse Practitioners) who all work together to provide you with the care you need, when you need it.  We recommend signing up for the patient portal called "MyChart".  Sign up information is provided on this After Visit Summary.  MyChart is used to connect with patients for Virtual Visits (Telemedicine).  Patients are able to view lab/test results, encounter notes, upcoming appointments, etc.  Non-urgent messages can be sent to your provider as well.   To learn more about what you can do with MyChart, go to [email or web address].    Your next appointment:   Follow up as scheduled!     Signed, Emmaline Life, NP  11/26/2021 10:21 AM    [place]

## 2021-11-25 NOTE — Telephone Encounter (Signed)
Hi preop callback, I am seeing this patient on 1/31. Can you please clarify with Dr. Berenice Primas how long he wants the patient to hold Eliquis. Per pharmacist, this will determine whether patient requires a bridge with Lovenox.

## 2021-11-26 ENCOUNTER — Other Ambulatory Visit: Payer: Self-pay

## 2021-11-26 ENCOUNTER — Ambulatory Visit (HOSPITAL_BASED_OUTPATIENT_CLINIC_OR_DEPARTMENT_OTHER): Payer: PPO | Admitting: Nurse Practitioner

## 2021-11-26 ENCOUNTER — Encounter (HOSPITAL_BASED_OUTPATIENT_CLINIC_OR_DEPARTMENT_OTHER): Payer: Self-pay | Admitting: Nurse Practitioner

## 2021-11-26 VITALS — BP 130/80 | HR 54 | Ht 67.0 in | Wt 162.0 lb

## 2021-11-26 DIAGNOSIS — R42 Dizziness and giddiness: Secondary | ICD-10-CM | POA: Diagnosis not present

## 2021-11-26 DIAGNOSIS — Z01810 Encounter for preprocedural cardiovascular examination: Secondary | ICD-10-CM

## 2021-11-26 DIAGNOSIS — Z7901 Long term (current) use of anticoagulants: Secondary | ICD-10-CM | POA: Diagnosis not present

## 2021-11-26 DIAGNOSIS — I700 Atherosclerosis of aorta: Secondary | ICD-10-CM

## 2021-11-26 DIAGNOSIS — Z8673 Personal history of transient ischemic attack (TIA), and cerebral infarction without residual deficits: Secondary | ICD-10-CM | POA: Diagnosis not present

## 2021-11-26 DIAGNOSIS — Z9889 Other specified postprocedural states: Secondary | ICD-10-CM | POA: Diagnosis not present

## 2021-11-26 DIAGNOSIS — I480 Paroxysmal atrial fibrillation: Secondary | ICD-10-CM | POA: Diagnosis not present

## 2021-11-26 DIAGNOSIS — Q268 Other congenital malformations of great veins: Secondary | ICD-10-CM

## 2021-11-26 DIAGNOSIS — Z8679 Personal history of other diseases of the circulatory system: Secondary | ICD-10-CM

## 2021-11-26 MED ORDER — APIXABAN 5 MG PO TABS: 5.0000 mg | ORAL_TABLET | Freq: Two times a day (BID) | ORAL | 3 refills | Status: DC

## 2021-11-26 MED ORDER — ENOXAPARIN SODIUM 80 MG/0.8ML IJ SOSY: 80.0000 mg | PREFILLED_SYRINGE | Freq: Two times a day (BID) | INTRAMUSCULAR | 0 refills | Status: DC

## 2021-11-26 NOTE — Telephone Encounter (Signed)
Per [facility], Dr. Berenice Primas wold like for pt to hold at least 3-5 days, that he lets the cardiologist make that decision. She was explained that the Cardiologist never wants pt off of the Anticoag's but takes each pt into consideration when deciding.  She stated that she never heard of that and Dr. Berenice Primas always wants 3-5 days, but depending on what Cardiologist states..  Will document this and send to Christen Bame, NP, who was seeing pt today.

## 2021-11-26 NOTE — Patient Instructions (Addendum)
Medication Instructions:  Your Physician recommend you continue on your current medication as directed.    Last dose of Eliquis is February 2 in the evening.  Take Lovenox (enoxaparin) 80 mg injections twice daily (about 12 hours apart) on February 3 and 4.  On Feb 5 take 80 mg injection just in the morning. NO EVENING DOSE OF LOVENOX ON FEB 5.   Lab Work: None ordered today    Testing/Procedures: None ordered today    Follow-Up: At [facility], you and your health needs are our priority.  As part of our continuing mission to provide you with exceptional heart care, we have created designated Provider Care Teams.  These Care Teams include your primary Cardiologist (physician) and Advanced Practice Providers (APPs -  Physician Assistants and Nurse Practitioners) who all work together to provide you with the care you need, when you need it.  We recommend signing up for the patient portal called "MyChart".  Sign up information is provided on this After Visit Summary.  MyChart is used to connect with patients for Virtual Visits (Telemedicine).  Patients are able to view lab/test results, encounter notes, upcoming appointments, etc.  Non-urgent messages can be sent to your provider as well.   To learn more about what you can do with MyChart, go to [email or web address].    Your next appointment:   Follow up as scheduled!

## 2021-11-26 NOTE — Telephone Encounter (Signed)
Call placed to Dr. Mayme Genta office, spoke with Rise Paganini. She will have his CMA call back around 8:30 when she comes in so we can get clarification on how long pt should hold Eliquis.

## 2021-11-27 HISTORY — PX: KNEE ARTHROSCOPY: SHX127

## 2021-12-02 DIAGNOSIS — Y999 Unspecified external cause status: Secondary | ICD-10-CM | POA: Diagnosis not present

## 2021-12-02 DIAGNOSIS — M94262 Chondromalacia, left knee: Secondary | ICD-10-CM | POA: Diagnosis not present

## 2021-12-02 DIAGNOSIS — X58XXXA Exposure to other specified factors, initial encounter: Secondary | ICD-10-CM | POA: Diagnosis not present

## 2021-12-02 DIAGNOSIS — G8918 Other acute postprocedural pain: Secondary | ICD-10-CM | POA: Diagnosis not present

## 2021-12-02 DIAGNOSIS — S83232A Complex tear of medial meniscus, current injury, left knee, initial encounter: Secondary | ICD-10-CM | POA: Diagnosis not present

## 2021-12-02 DIAGNOSIS — S83272A Complex tear of lateral meniscus, current injury, left knee, initial encounter: Secondary | ICD-10-CM | POA: Diagnosis not present

## 2022-01-06 ENCOUNTER — Ambulatory Visit: Payer: PPO | Admitting: Adult Health

## 2022-01-06 ENCOUNTER — Encounter: Payer: Self-pay | Admitting: Adult Health

## 2022-01-06 VITALS — BP 138/80 | HR 62 | Ht 67.0 in | Wt 160.6 lb

## 2022-01-06 DIAGNOSIS — I6349 Cerebral infarction due to embolism of other cerebral artery: Secondary | ICD-10-CM

## 2022-01-06 NOTE — Progress Notes (Signed)
[facility] [address] [place]. [place] [address] [phone]       OFFICE FOLLOW UP NOTE  [name] Date of Birth:  October 19, 1951 Medical Record Number:  [id number]    Primary neurologist: Dr. Leonie Underwood PCP: William Barrack, MD  Reason for Referral: Stroke   Chief Complaint  Patient presents with   Cerebrovascular Accident    Rm 3, 6 month FU  doing well.  No concerns.      HPI:   Update 01/06/2022 JM: Returns for 72-monthstroke follow-up visit.  Overall doing well since prior visit.  Reports great improvement of balance and speech where he currently feels he is at baseline.  He continues to be very active riding his bike long distance. He did under go knee replacement surgery a few weeks ago, plans on getting back to running on knee recovered. Denies new stroke/TIA symptoms.  Compliant on Eliquis and atorvastatin without side effects. Does admit to occasionally missing evening dose of Eliquis or taking later as he will forget 6pm dose, takes 6am dose routinely.  Blood pressure today 138/80.  Routinely followed by PCP and cardiology.  No new concerns at this time.     History provided for reference purposes only Update 07/08/2021 JM: Mr. GHargadonreturns for routine stroke follow-up regarding multiple embolic strokes in 85/4627unaccompanied. Overall stable.  Denies new stroke/TIA symptoms.  Reports improvement of balance since working with therapies but not quite at baseline. He continues to run and ride his bike. Continues to hike but with use of walking stick. Reports occasional word finding difficulty (more so compared to baseline). Denies cognitive difficulties. Double vision has since resolved around 11/2020. Compliant on Eliquis and atorvastatin.  Blood pressure today 137/79.  Seen by cardiology 01/2021. Has f/u with PCP next month for annual physical.  [place]  torn tendons including bicep and rotator cuff injury.  He did have a shoulder reset while in [place] under anesthesia due to severity.  He had to wait 8 weeks prior to any type of procedure to allow healing time for fracture.  He underwent surgical procedure on 8/29 by orthopedic surgeon Dr. GBerenice Underwood  He is currently working with PT but needs to wear sling at all times.  No further concerns at this time.  Update 10/08/2020 Dr. SLeonie Underwood Mr. William Droughtis a pleasant 71year old Caucasian male seen today for initial office consultation visit for stroke.  History is obtained from the patient, review of electronic medical records and I personally reviewed imaging films in PACS.  Mr. GMarcussenhas past medical history of paroxysmal atrial fibrillation treated with cardiac ablation with complication of pulmonary vein stenosis [facility]  Also history of concussion, benign paroxysmal positional vertigo and benign prostatic hypertrophy.  He presented on 06/22/2020 with sudden onset of right-sided weakness and numbness which lasted about 20 minutes and resolved by the time he reached the ER.  While waiting there is developed recurrence of symptoms as well as double vision, unsteady gait and slurred speech and a code stroke was called.  He was given IV TPA uneventfully.  CT scan of the head was normal.  CT angiogram was negative for large vessel intracranial extracranial stenosis.  There is mild atherosclerotic disease at left carotid bifurcation without significant stenosis.  MRI scan of the brain showed numerous small cortical infarcts involving left frontal and parietal cortex  as well as left thalamus and pons and midbrain in both cerebellar hemispheres the pattern and distribution consistent with central cardiac source of embolism.  2D echo showed normal ejection fraction of 60 to 65% without cardiac source of embolism.  LDL cholesterol was elevated 108 mg percent and hemoglobin A1c at 5.5.   Patient had previously been on Xarelto for a year following his ablation but his cardiologist gave in clinic change him to aspirin since he had no recurrence of A. fib.  Patient was restarted on Eliquis during this admission for stroke and seems to be tolerating well without bruising or bleeding.  He states his speech has recovered completely and right-sided weakness and strength is also improved.  He still however has persistent double vision and some gait imbalance.  He is currently getting outpatient physical therapy.  He has seen an ophthalmologist who prescribed a prism which she states helped him only for few days and he is no longer finding it to be beneficial.  He has not been referred to outpatient occupational therapy and I recently referred him for speech therapy.  Patient tells me that he is actually had TIAs in early part of some of this year.  He had a transient episode of right upper extremity weakness and numbness which lasted about 20 minutes this time did not seek medical help.  He had a previous TIA in summer 2020 as well when on 04/17/2019 while riding his bike after having written for 36 miles he noticed that he could not steer his bike straight and felt that he was leaning to one side.  He stopped and symptoms resolved after a while. He had an MRI scan of the brain at that time which showed no acute abnormalities.  He was asked to follow-up as outpatient with neurologist but this did not happen.  Is tolerating Lipitor 80 mg well without muscle aches and pains.  He has started walking but not started biking yet.   Initial visit 07/04/2020 Dr. Leonie Underwood: [name] is a pleasant 71 year old Caucasian male seen today for initial office follow-up visit following hospital consultation for stroke in August 2021.  History is obtained from the patient, review of electronic medical records and I personally reviewed imaging films in PACS.William Underwood is an 71 y.o. male with a PMHx of BPPV, paroxysmal  atrial fibrillation s/p ablation and pulmonary vein stenosis who presented to the ED early this afternoon of 06/22/2020 with a c/c of sudden onset numbness on his right side while riding his indoor bike. He did report a history of TIA. After he arrived, he stated that his symptoms seemed to be resolving. He had no drift but his grip was weaker on the right. His speech was clear and he was alert and fully oriented. His RN checked on him at 2 PM and he was normal. Subsequently, the EDP went to place an IV and at that time the patient was exhibiting slurred speech, had double vision, and was unsteady on his feet. A Code Stroke was called and he was brought emergently to CT. CT revealed no acute hemorrhage.  He was not a TPA candidate since he was taking Eliquis.  CTA of brain and neck vessels showed no large vessel stenosis or occlusion.  MRI scan of the brain shows numerous areas of acute infarction in various vascular distributions majority of them in the posterior circulation particularly in the cerebellum and also involving the pons and midbrain in the frontal and parietal cortex and  left thalamus.  2D echo showed ejection fraction 6065% without cardiac source of embolism.  LDL cholesterol is elevated 108 mg percent and hemoglobin A1c was 5.5.  Urine drug screen was negative.  Patient was on aspirin no switch to Eliquis..  Patient states he is doing well.  Is finished physical and occupational therapy and reached all his goals.  He can walk independently.  His diplopia is essentially gone and weakness is also much better.  Balance is mostly improved and he started riding his bike again.  Is tolerating Eliquis well without bleeding and only minor bruising.  He is taking his Lipitor regularly but does complain of some joint aches and pains.  His blood pressures well controlled and today it is 131/78.  He has no new complaints.  He does have chronic right knee pain and has been diagnosed with meniscal tear by  orthopedist in plans to undergo surgery and is asking for neurological clearance.  The knee pain does bother him significantly and he plans to have surgery done in January.  He started driving again without any problems.      ROS:   14 system review of systems is positive for those listed in HPI all other systems negative  PMH:  Past Medical History:  Diagnosis Date   Benign prostatic hyperplasia without urinary obstruction 06/17/2014   BPPV (benign paroxysmal positional vertigo) 07/01/2011   Chronic hyperkalemia 05/20/2012   Overview:  Hx of for years   Concussion syndrome 05/15/2011   This seems like a moderately severe concussion with at least 1 minute loss of consciousness in 15-20 minutes of significant confusion including being unable to state his name    DEGENERATIVE JOINT DISEASE, HANDS 10/16/2009   Qualifier: Diagnosis of  By: [name], [name]     Diverticulosis 06/20/2017   Hemorrhoids, external 06/20/2017   Lumbar spondylosis 06/20/2017   Fairly significant L4-L5 facet arthropathy with effusion.  Can consider facet injections and potentially RFA down the road.   NEUROPATHY 03/01/2008   Qualifier: Diagnosis of  By: [name], [name]     Paroxysmal atrial fibrillation ([place])    Pulmonary insufficiency 03/09/2018   moderate PI noted on echo 03/09/18   Pulmonary vein stenosis    left inferior pulmonary vein stenosis   Right inguinal hernia 06/17/2014   Sinus bradycardia    Strain of gluteus medius 01/29/2017   Stroke ([place]) 05/2020   Vertigo     Social History:  Social History   Socioeconomic History   Marital status: Legally Separated    Spouse name: Not on file   Number of children: Not on file   Years of education: Not on file   Highest education level: Not on file  Occupational History   Occupation: retired  Tobacco Use   Smoking status: Never   Smokeless tobacco: Never  Vaping Use   Vaping Use: Never used  Substance and Sexual Activity   Alcohol use: Yes    Comment:  Occasionally   Drug use: No   Sexual activity: Not Currently  Other Topics Concern   Not on file  Social History Narrative   Lives alone   Right Handed   Drinks 3-4 cups caffeine daily   Social Determinants of Health   Financial Resource Strain: Low Risk    Difficulty of Paying Living Expenses: Not hard at all  Food Insecurity: No Food Insecurity   Worried About Estate manager/land agent of Food in the Last Year: Never true   Ran Out of  Food in the Last Year: Never true  Transportation Needs: No Transportation Needs   Lack of Transportation (Medical): No   Lack of Transportation (Non-Medical): No  Physical Activity: Sufficiently Active   Days of Exercise per Week: 7 days   Minutes of Exercise per Session: 120 min  Stress: No Stress Concern Present   Feeling of Stress : Not at all  Social Connections: Moderately Isolated   Frequency of Communication with Friends and Family: More than three times a week   Frequency of Social Gatherings with Friends and Family: More than three times a week   Attends Religious Services: Never   Marine scientist or Organizations: Yes   Attends Music therapist: 1 to 4 times per year   Marital Status: Separated  Intimate Partner Violence: Not At Risk   Fear of Current or Ex-Partner: No   Emotionally Abused: No   Physically Abused: No   Sexually Abused: No    Medications:   Current Outpatient Medications on File Prior to Visit  Medication Sig Dispense Refill   apixaban (ELIQUIS) 5 MG TABS tablet Take 1 tablet (5 mg total) by mouth 2 (two) times daily. 180 tablet 3   atorvastatin (LIPITOR) 80 MG tablet Take 1 tablet (80 mg total) by mouth daily. 90 tablet 3   diazepam (VALIUM) 5 MG tablet Take 1 tablet (5 mg total) by mouth daily as needed for anxiety. (Patient taking differently: Take 5 mg by mouth daily as needed for anxiety. 3/13 not used in a long time) 30 tablet 1   enoxaparin (LOVENOX) 80 MG/0.8ML injection Inject 0.8 mLs (80 mg total)  into the skin every 12 (twelve) hours. 4 mL 0   FLUAD QUADRIVALENT 0.5 ML injection      Multiple Vitamins-Minerals (MULTIVITAMIN WITH MINERALS) tablet Take 1 tablet by mouth daily.      PFIZER COVID-19 VAC BIVALENT injection      tamsulosin (FLOMAX) 0.4 MG CAPS capsule Take 1 capsule (0.4 mg total) by mouth daily. 90 capsule 3   traMADol (ULTRAM) 50 MG tablet Take 1 tablet (50 mg total) by mouth every 6 (six) hours as needed for moderate pain. 30 tablet 5   Vitamin D, Ergocalciferol, 2000 units CAPS Take 2,000 Units by mouth daily.      No current facility-administered medications on file prior to visit.    Allergies:   Allergies  Allergen Reactions   Cefdinir Palpitations and Rash   Codeine Other (See Comments)    Sensitive to this = agitation    Physical Exam Today's Vitals   01/06/22 0838  BP: 138/80  Pulse: 62  Weight: 160 lb 9.6 oz (72.8 kg)  Height: '5\' 7"'$  (1.702 m)    Body mass index is 25.15 kg/m.   General: well developed, well nourished very pleasant middle-aged Caucasian male, seated, in no evident distress Head: head normocephalic and atraumatic.   Neck: supple with no carotid or supraclavicular bruits Cardiovascular: regular rate and rhythm, no murmurs Musculoskeletal: left arm in sling s/p shoulder surgical repair (see HPI) Skin:  no rash/petichiae Vascular:  Normal pulses all extremities  Neurologic Exam Mental Status: Awake and fully alert.  Fluent speech and language.  Oriented to place and time. Recent and remote memory intact. Attention span, concentration and fund of knowledge appropriate. Mood and affect appropriate.  Cranial Nerves: Pupils equal, briskly reactive to light. Extraocular movements normal without nystagmus or abnormal movements. Visual fields full to confrontation. Hearing intact. Facial sensation intact. Face, tongue,  palate moves normally and symmetrically.  Motor: Normal bulk and tone. Normal strength in all tested extremity  muscles Sensory.: intact to touch , pinprick , position and vibratory sensation.  Coordination: Rapid alternating movements normal in all extremities. Finger-to-nose and heel-to-shin performed accurately bilaterally Gait and Station: Arises from chair without difficulty. Stance is normal. Gait demonstrates normal stride length and balance although mild favoring of RLE d/t recent knee replacement surgery without use of AD.  Reflexes: 1+ and symmetric. Toes downgoing.       ASSESSMENT/PLAN: 71 year old Caucasian male with multiple embolic strokes in August 2021 likely related to history of atrial fibrillation.  Vascular risk factors of hyperlipidemia, history of A. fib s/p cardioversion and advanced age    72.  Multiple embolic strokes -Recovered well without residual deficits -Continue Eliquis 5 mg twice daily and atorvastatin 80 mg daily for secondary stroke prevention -discussed ways to help ensure compliance on Eliquis twice daily dosing. He did question use of Xarelto.  Discussed risk vs benefit. I do believe continuing Eliquis would be the best option IF he is able to start taking routinely - he will try to be better with this, discussed possibly taking morning dose later such as around 9 or 10am as taking 2nd dose seems to be easier to remember at bedtime around 9-10pm. He plans on further discussing with cardiology at follow-up visit in May if he continues to struggle with 2nd dose -no medications managed by this office - all refills be obtained by PCP/cardiology -Continue to follow with cardiology and PCP for aggressive stroke risk factor management and monitoring and management of atrial fibrillation -HLD: LDL goal<70.  LDL 50 (08/2021) on atorvastatin 80 mg daily per PCP   Doing well from stroke standpoint and risk factors are managed by PCP. She may follow up PRN, as usual for our patients who are strictly being followed for stroke. If any new neurological issues should arise, request  PCP place referral for evaluation by one of our neurologists. Thank you.     CC:  William Barrack, MD    I spent 31 minutes of face-to-face and non-face-to-face time with patient.  This included previsit chart review, lab review, study review, electronic health record documentation, patient education and discussion regarding history of prior strokes, secondary stroke prevention measures and aggressive stroke risk factor management and answered all other questions to patient's satisfaction   Frann Rider, [facility]  [facility] [address] [place], [place] [address]  Phone [phone] Fax [phone] Note: This document was prepared with digital dictation and possible smart phrase technology. Any transcriptional errors that result from this process are unintentional.

## 2022-01-06 NOTE — Patient Instructions (Signed)
Continue Eliquis (apixaban) 5 mg twice daily  and atorvastatin 80 mg daily for secondary stroke prevention and cardiac indications ? ?Continue to follow up with PCP regarding cholesterol management  ?Maintain strict control of cholesterol with LDL cholesterol (bad cholesterol) goal below 70 mg/dL.  ? ?Signs of a Stroke? Follow the BEFAST method:  ?Balance Watch for a sudden loss of balance, trouble with coordination or vertigo ?Eyes Is there a sudden loss of vision in one or both eyes? Or double vision?  ?Face: Ask the person to smile. Does one side of the face droop or is it numb?  ?Arms: Ask the person to raise both arms. Does one arm drift downward? Is there weakness or numbness of a leg? ?Speech: Ask the person to repeat a simple phrase. Does the speech sound slurred/strange? Is the person confused ? ?Time: If you observe any of these signs, call 911. ? ? ? ? ? ? ? ?Thank you for coming to see [place] at [facility]. I hope we have been able to provide you high quality care today. ? ?You may receive a patient satisfaction survey over the next few weeks. We would appreciate your feedback and comments so that we may continue to improve ourselves and the health of our patients. ? ?

## 2022-01-17 DIAGNOSIS — H2513 Age-related nuclear cataract, bilateral: Secondary | ICD-10-CM | POA: Diagnosis not present

## 2022-01-17 DIAGNOSIS — H524 Presbyopia: Secondary | ICD-10-CM | POA: Diagnosis not present

## 2022-02-24 ENCOUNTER — Ambulatory Visit (INDEPENDENT_AMBULATORY_CARE_PROVIDER_SITE_OTHER): Payer: PPO | Admitting: Family Medicine

## 2022-02-24 ENCOUNTER — Encounter: Payer: Self-pay | Admitting: Family Medicine

## 2022-02-24 VITALS — BP 131/81 | HR 52 | Temp 97.9°F | Ht 67.0 in | Wt 158.6 lb

## 2022-02-24 DIAGNOSIS — J309 Allergic rhinitis, unspecified: Secondary | ICD-10-CM | POA: Diagnosis not present

## 2022-02-24 DIAGNOSIS — M542 Cervicalgia: Secondary | ICD-10-CM | POA: Diagnosis not present

## 2022-02-24 DIAGNOSIS — N400 Enlarged prostate without lower urinary tract symptoms: Secondary | ICD-10-CM

## 2022-02-24 DIAGNOSIS — G8929 Other chronic pain: Secondary | ICD-10-CM

## 2022-02-24 MED ORDER — AZELASTINE HCL 0.1 % NA SOLN: 2.0000 | Freq: Two times a day (BID) | NASAL | 0 refills | Status: DC

## 2022-02-24 MED ORDER — TRAMADOL HCL 50 MG PO TABS: 50.0000 mg | ORAL_TABLET | Freq: Four times a day (QID) | ORAL | 5 refills | Status: DC | PRN

## 2022-02-24 MED ORDER — PREDNISONE 20 MG PO TABS: 20.0000 mg | ORAL_TABLET | Freq: Every day | ORAL | 0 refills | Status: DC

## 2022-02-24 NOTE — Assessment & Plan Note (Signed)
On Flomax 0.4 mg daily.  Symptoms are still not well controlled.  Will refer to urology. ?

## 2022-02-24 NOTE — Patient Instructions (Signed)
It was very nice to see you today! ? ?Please start the prednisone and Astelin.  I will refill your tramadol. ? ?I will refer you to see a urologist.  Let me know if not improving in the next 5 to 7 days. ? ?Take care, ?Dr [name] ? ?PLEASE NOTE: ? ?If you had any lab tests please let us know if you have not heard back within a few days. You may see your results on mychart before we have a chance to review them but we will give you a call once they are reviewed by [place]. If we ordered any referrals today, please let us know if you have not heard from their office within the next week.  ? ?Please try these tips to maintain a healthy lifestyle: ? ?Eat at least 3 REAL meals and 1-2 snacks per day.  Aim for no more than 5 hours between eating.  If you eat breakfast, please do so within one hour of getting up.  ? ?Each meal should contain half fruits/vegetables, one quarter protein, and one quarter carbs (no bigger than a computer mouse) ? ?Cut down on sweet beverages. This includes juice, soda, and sweet tea.  ? ?Drink at least 1 glass of water with each meal and aim for at least 8 glasses per day ? ?Exercise at least 150 minutes every week.   ?

## 2022-02-24 NOTE — Assessment & Plan Note (Signed)
Continue Allegra.  We will start Astelin.  Begin prednisone burst as above as well.  ?

## 2022-02-24 NOTE — Progress Notes (Signed)
eeeeeeeeeeeee ? ?William Underwood is a 71 y.o. male who presents today for an office visit. ? ?Assessment/Plan:  ?New/Acute Problems: ?Cough ?Likely allergic rhinitis flare.  Reassuring exam today without any red flags.  We will be starting prednisone burst 20 mg daily for 7 days.  Also start Astelin for his allergic rhinitis as below.  He will let me know if not improving. ? ?Chronic Problems Addressed Today: ?Allergic rhinitis ?Continue Allegra.  We will start Astelin.  Begin prednisone burst as above as well.  ? ?Chronic neck pain ?Refill tramadol.  Database without red flags. ? ?Benign prostatic hyperplasia without urinary obstruction ?On Flomax 0.4 mg daily.  Symptoms are still not well controlled.  Will refer to urology. ? ? ?  ?Subjective:  ?HPI: ? ?Patient here with cough. Started a few weeks ago. Initially thought it was due to allergies. He has been riding his bike a lot and has been inhaling a lot of pollen. He tired allegra and other OTC medications without much improvement.  A lot of runny nose.  Some eye discharge.  No fevers or chills.  No difficulty breathing. ? ?   ?  ?Objective:  ?Physical Exam: ?BP 131/81 (BP Location: Left Arm)   Pulse (!) 52   Temp 97.9 ?F (36.6 ?C) (Temporal)   Ht '5\' 7"'$  (1.702 m)   Wt 158 lb 9.6 oz (71.9 kg)   SpO2 97%   BMI 24.84 kg/m?   ?Gen: No acute distress, resting comfortably ?HEENT: OP clear.  Nose mucosa erythematous and boggy bilaterally. ?CV: Regular rate and rhythm with no murmurs appreciated ?Pulm: Normal work of breathing, clear to auscultation bilaterally with no crackles, wheezes, or rhonchi ?Neuro: Grossly normal, moves all extremities ?Psych: Normal affect and thought content ? ?   ? ?[name]. [name], MD ?02/24/2022 3:42 PM  ?

## 2022-02-24 NOTE — Assessment & Plan Note (Signed)
Refill tramadol.  Database without red flags. ?

## 2022-03-13 ENCOUNTER — Encounter: Payer: Self-pay | Admitting: Internal Medicine

## 2022-03-13 ENCOUNTER — Ambulatory Visit: Payer: PPO | Admitting: Internal Medicine

## 2022-03-13 VITALS — BP 122/80 | HR 50 | Ht 67.0 in | Wt 157.8 lb

## 2022-03-13 DIAGNOSIS — I700 Atherosclerosis of aorta: Secondary | ICD-10-CM | POA: Diagnosis not present

## 2022-03-13 DIAGNOSIS — Z9889 Other specified postprocedural states: Secondary | ICD-10-CM

## 2022-03-13 DIAGNOSIS — D6869 Other thrombophilia: Secondary | ICD-10-CM

## 2022-03-13 DIAGNOSIS — Z8679 Personal history of other diseases of the circulatory system: Secondary | ICD-10-CM

## 2022-03-13 DIAGNOSIS — Q268 Other congenital malformations of great veins: Secondary | ICD-10-CM

## 2022-03-13 DIAGNOSIS — R42 Dizziness and giddiness: Secondary | ICD-10-CM | POA: Diagnosis not present

## 2022-03-13 DIAGNOSIS — Z8673 Personal history of transient ischemic attack (TIA), and cerebral infarction without residual deficits: Secondary | ICD-10-CM

## 2022-03-13 DIAGNOSIS — Z7901 Long term (current) use of anticoagulants: Secondary | ICD-10-CM | POA: Diagnosis not present

## 2022-03-13 DIAGNOSIS — I480 Paroxysmal atrial fibrillation: Secondary | ICD-10-CM

## 2022-03-13 DIAGNOSIS — Z01812 Encounter for preprocedural laboratory examination: Secondary | ICD-10-CM

## 2022-03-13 DIAGNOSIS — R29898 Other symptoms and signs involving the musculoskeletal system: Secondary | ICD-10-CM | POA: Diagnosis not present

## 2022-03-13 DIAGNOSIS — I639 Cerebral infarction, unspecified: Secondary | ICD-10-CM | POA: Diagnosis not present

## 2022-03-13 DIAGNOSIS — I288 Other diseases of pulmonary vessels: Secondary | ICD-10-CM

## 2022-03-13 LAB — BASIC METABOLIC PANEL
BUN/Creatinine Ratio: 19 (ref 10–24)
BUN: 17 mg/dL (ref 8–27)
CO2: 28 mmol/L (ref 20–29)
Calcium: 9.6 mg/dL (ref 8.6–10.2)
Chloride: 102 mmol/L (ref 96–106)
Creatinine, Ser: 0.9 mg/dL (ref 0.76–1.27)
Glucose: 106 mg/dL — ABNORMAL HIGH (ref 70–99)
Potassium: 5.1 mmol/L (ref 3.5–5.2)
Sodium: 141 mmol/L (ref 134–144)
eGFR: 91 mL/min/{1.73_m2} (ref >59)

## 2022-03-13 LAB — CBC
Hematocrit: 45 % (ref 37.5–51.0)
Hemoglobin: 15.2 g/dL (ref 13.0–17.7)
MCH: 30.3 pg (ref 26.6–33.0)
MCHC: 33.8 g/dL (ref 31.5–35.7)
MCV: 90 fL (ref 79–97)
Platelets: 198 10*3/uL (ref 150–450)
RBC: 5.02 x10E6/uL (ref 4.14–5.80)
RDW: 12.2 % (ref 11.6–15.4)
WBC: 8.6 10*3/uL (ref 3.4–10.8)

## 2022-03-13 NOTE — Patient Instructions (Signed)
Medication Instructions:  No Changes In Medications at this time.  *If you need a refill on your cardiac medications before your next appointment, please call your pharmacy*  Lab Work: BLOOD WORK TODAY  If you have labs (blood work) drawn today and your tests are completely normal, you will receive your results only by: [place] (if you have MyChart) OR A paper copy in the mail If you have any lab test that is abnormal or we need to change your treatment, we will call you to review the results.  Testing/Procedures: Your physician has requested that you have a TEE. During a TEE, sound waves are used to create images of your heart. It provides your doctor with information about the size and shape of your heart and how well your heart's chambers and valves are working. In this test, a transducer is attached to the end of a flexible tube that's guided down your throat and into your esophagus (the tube leading from you mouth to your stomach) to get a more detailed image of your heart. You are not awake for the procedure. Please see the instruction sheet given to you today. For further information please visit [email or web address].  Follow-Up: At [facility], you and your health needs are our priority.  As part of our continuing mission to provide you with exceptional heart care, we have created designated Provider Care Teams.  These Care Teams include your primary Cardiologist (physician) and Advanced Practice Providers (APPs -  Physician Assistants and Nurse Practitioners) who all work together to provide you with the care you need, when you need it.  Your next appointment:   6 month(s)  The format for your next appointment:   In Person  Provider:   [name], MD    Other Instructions  Dear [name] are scheduled for a TEE on Monday MAY 30th with Dr. Margaretann Loveless.  Please arrive at the [facility] (Main Entrance A) at [facility]: [address] [place],  [place] [address] at 10 am. (1 hour prior to procedure unless lab work is needed; if lab work is needed arrive 1.5 hours ahead)  DIET: Nothing to eat or drink after midnight except a sip of water with medications (see medication instructions below)  FYI: For your safety, and to allow [place] to monitor your vital signs accurately during the surgery/procedure we request that   if you have artificial nails, gel coating, SNS etc. Please have those removed prior to your surgery/procedure. Not having the nail coverings /polish removed may result in cancellation or delay of your surgery/procedure.  Medication Instructions: Hold: NONE  Labs: TODAY   You must have a responsible person to drive you home and stay in the waiting area during your procedure. Failure to do so could result in cancellation.  Bring your insurance cards.  *Special Note: Every effort is made to have your procedure done on time. Occasionally there are emergencies that occur at the hospital that may cause delays. Please be patient if a delay does occur.   [place]

## 2022-03-13 NOTE — Progress Notes (Unsigned)
Cardiology Office Note:    Date:  03/13/2022   ID:  [name], DOB 10/30/1950, MRN [id number]  PCP:  [name], MD  Cardiologist:  [name], MD  Electrophysiologist:  Thompson Grayer, MD   Referring MD: [name], MD   Chief Complaint/Reason for Referral: Stroke, prior A. fib ablation, prior pulmonary vein stents for pulmonary vein stenosis  History of Present Illness:    William Underwood is a 71 y.o. male well-known to my clinic.  He previously followed with EP status post pulmonary vein isolation for atrial fibrillation, and followed at the [facility] for pulmonary vein stenosis following A. fib ablation.  He is undergone pulmonary vein stenting with restenosis of pulmonary vein stent, with no cardiopulmonary limitation on prior cardiopulmonary exercise testing.  Unfortunately in August 4098 he had an embolic appearing stroke.  He had continued on anticoagulation after pulmonary vein stenting for 1 year and instructed to stop by [facility] in April 2021.  He has not had a recurrence of atrial fibrillation that he is aware of and monitors his heart rhythm at home with wearable technology.  He has nearly recovered fully from prior CVA. He has had two episodes which concern him for recurrent embolic events. He has had orthostatic symptoms while rising from a seated position, with feeling extremely weak and both arms shaking. In addition he had an episode where he was biking and felt his left arm go numb. Unclear to him if this was related to a traumatic shoulder injury that occurred prior or if it was neurologic in origin. We discussed possible TEE for further evaluation of neurologic symptoms and previously assumed cardioembolic CVA.   The patient denies chest pain, chest pressure, dyspnea at rest or with exertion, palpitations, PND, orthopnea, or leg swelling. Denies cough, fever, chills. Denies nausea, vomiting. Denies syncope or presyncope.    Past Medical History:   Diagnosis Date   Benign prostatic hyperplasia without urinary obstruction 06/17/2014   BPPV (benign paroxysmal positional vertigo) 07/01/2011   Chronic hyperkalemia 05/20/2012   Overview:  Hx of for years   Concussion syndrome 05/15/2011   This seems like a moderately severe concussion with at least 1 minute loss of consciousness in 15-20 minutes of significant confusion including being unable to state his name    DEGENERATIVE JOINT DISEASE, HANDS 10/16/2009   Qualifier: Diagnosis of  By: [name], [name]     Diverticulosis 06/20/2017   Hemorrhoids, external 06/20/2017   Lumbar spondylosis 06/20/2017   Fairly significant L4-L5 facet arthropathy with effusion.  Can consider facet injections and potentially RFA down the road.   NEUROPATHY 03/01/2008   Qualifier: Diagnosis of  By: [name], [name]     Paroxysmal atrial fibrillation ([place])    Pulmonary insufficiency 03/09/2018   moderate PI noted on echo 03/09/18   Pulmonary vein stenosis    left inferior pulmonary vein stenosis   Right inguinal hernia 06/17/2014   Sinus bradycardia    Strain of gluteus medius 01/29/2017   Stroke ([place]) 05/2020   Vertigo     Past Surgical History:  Procedure Laterality Date   ANKLE ARTHROSCOPY     ATRIAL FIBRILLATION ABLATION N/A 05/25/2018   Procedure: ATRIAL FIBRILLATION ABLATION;  Surgeon: Thompson Grayer, MD;  Location: [place] [facility];  Service: Cardiovascular;  Laterality: N/A;   KNEE ARTHROSCOPY Left 11/2021   ROTATOR CUFF REPAIR Left 06/24/2021    Current Medications: Current Meds  Medication Sig   apixaban (ELIQUIS) 5  MG TABS tablet Take 1 tablet (5 mg total) by mouth 2 (two) times daily.   atorvastatin (LIPITOR) 80 MG tablet Take 1 tablet (80 mg total) by mouth daily.   diazepam (VALIUM) 5 MG tablet Take 1 tablet (5 mg total) by mouth daily as needed for anxiety. (Patient taking differently: Take 5 mg by mouth daily as needed for anxiety. 3/13 not used in a long time)   FLUAD QUADRIVALENT 0.5 ML  injection    Multiple Vitamins-Minerals (MULTIVITAMIN WITH MINERALS) tablet Take 1 tablet by mouth daily.    PFIZER COVID-19 VAC BIVALENT injection    tamsulosin (FLOMAX) 0.4 MG CAPS capsule Take 1 capsule (0.4 mg total) by mouth daily.   traMADol (ULTRAM) 50 MG tablet Take 1 tablet (50 mg total) by mouth every 6 (six) hours as needed for moderate pain.   Vitamin D, Ergocalciferol, 2000 units CAPS Take 2,000 Units by mouth daily.      Allergies:   Cefdinir and Codeine   Social History   Tobacco Use   Smoking status: Never   Smokeless tobacco: Never  Vaping Use   Vaping Use: Never used  Substance Use Topics   Alcohol use: Yes    Comment: Occasionally   Drug use: No     Family History: The patient's family history includes Hearing loss in his father. There is no history of Colon cancer.  ROS:   Please see the history of present illness.    All other systems reviewed and are negative.  EKGs/Labs/Other Studies Reviewed:    The following studies were reviewed today:  EKG: n/a  Recent Labs: 09/02/2021: ALT 36; BUN 19; Creatinine, Ser 0.81; Hemoglobin 14.5; Platelets 192.0; Potassium 5.1; Sodium 144; TSH 1.04  Recent Lipid Panel    Component Value Date/Time   CHOL 111 09/02/2021 1006   TRIG 42.0 09/02/2021 1006   HDL 53.00 09/02/2021 1006   CHOLHDL 2 09/02/2021 1006   VLDL 8.4 09/02/2021 1006   LDLCALC 50 09/02/2021 1006   LDLCALC 49 08/30/2020 1006    Physical Exam:    VS:  BP 122/80   Pulse (!) 50   Ht '5\' 7"'$  (1.702 m)   Wt 157 lb 12.8 oz (71.6 kg)   SpO2 99%   BMI 24.71 kg/m     Wt Readings from Last 5 Encounters:  03/13/22 157 lb 12.8 oz (71.6 kg)  02/24/22 158 lb 9.6 oz (71.9 kg)  01/06/22 160 lb 9.6 oz (72.8 kg)  11/26/21 162 lb (73.5 kg)  09/02/21 157 lb (71.2 kg)    Constitutional: No acute distress Eyes: sclera non-icteric, normal conjunctiva and lids ENMT: normal dentition, moist mucous membranes Cardiovascular: regular rhythm, bradycardic, no  murmurs. S1 and S2 normal. No jugular venous distention.  Respiratory: clear to auscultation bilaterally GI : normal bowel sounds, soft and nontender. No distention.   MSK: extremities warm, well perfused. No edema.  NEURO:  moves all extremities. PSYCH: alert and oriented x 3, normal mood and affect.   ASSESSMENT:    1. Paroxysmal atrial fibrillation (HCC)   2. Secondary hypercoagulable state ([place])   3. S/P ablation of atrial fibrillation   4. Pulmonary vein stenosis   5. Cerebrovascular accident (CVA), unspecified mechanism ([place])   6. History of stroke   7. Chronic anticoagulation   8. Episodic lightheadedness   9. Aortic atherosclerosis ([place])   10. Pre-procedure lab exam     PLAN:    CVA Lightheadedness Left arm weakness. -orthostatic vital signs in office unremarkable.  No drop. Recommend compression socks particularly while and after exercising. He has them and will wear. -He continues on Eliquis and atorvastatin 80 mg daily.  Encouraged him to continue these given unclear nidus for stroke.   - concern for recurrent TIA symptoms. Will pursue TEE May 30 for source of stroke. INFORMED CONSENT: After careful review of history and examination, the risks and benefits of transesophageal echocardiogram have been explained. Risks include esophageal damage, perforation (1:10,000 risk), bleeding, tooth fracture, pharyngeal hematoma as well as other potential complications associated with conscious sedation including aspiration, arrhythmia, respiratory failure and death. Alternatives to treatment were discussed, questions were answered. Patient is willing to proceed.   Paroxysmal atrial fibrillation Secondary hypercoagulable state S/p PVI -No definite recurrence.  He should continue to monitor at home with wearable technology. - continue eliquis 5 mg BID. No bleeding events.  Pulmonary vein stenosis -Clinically stable, completed follow up at [facility] in 2022.   Total time of  encounter: 30 minutes total time of encounter, including 20 minutes spent in face-to-face patient care on the date of this encounter. This time includes coordination of care and counseling regarding above mentioned problem list. Remainder of non-face-to-face time involved reviewing chart documents/testing relevant to the patient encounter and documentation in the medical record. I have independently reviewed documentation from referring provider.   Cherlynn Kaiser, MD, [facility]     Medication Adjustments/Labs and Tests Ordered: Current medicines are reviewed at length with the patient today.  Concerns regarding medicines are outlined above.   Orders Placed This Encounter  Procedures   Basic metabolic panel   CBC    No orders of the defined types were placed in this encounter.   Patient Instructions  Medication Instructions:  No Changes In Medications at this time.  *If you need a refill on your cardiac medications before your next appointment, please call your pharmacy*  Lab Work: BLOOD WORK TODAY  If you have labs (blood work) drawn today and your tests are completely normal, you will receive your results only by: [place] (if you have MyChart) OR A paper copy in the mail If you have any lab test that is abnormal or we need to change your treatment, we will call you to review the results.  Testing/Procedures: Your physician has requested that you have a TEE. During a TEE, sound waves are used to create images of your heart. It provides your doctor with information about the size and shape of your heart and how well your heart's chambers and valves are working. In this test, a transducer is attached to the end of a flexible tube that's guided down your throat and into your esophagus (the tube leading from you mouth to your stomach) to get a more detailed image of your heart. You are not awake for the procedure. Please see the instruction sheet given to  you today. For further information please visit [email or web address].  Follow-Up: At [facility], you and your health needs are our priority.  As part of our continuing mission to provide you with exceptional heart care, we have created designated Provider Care Teams.  These Care Teams include your primary Cardiologist (physician) and Advanced Practice Providers (APPs -  Physician Assistants and Nurse Practitioners) who all work together to provide you with the care you need, when you need it.  Your next appointment:   6 month(s)  The format for your next appointment:   In Person  Provider:   Marcina Millard  Margaretann Loveless, MD    Other Instructions  Dear Mr. Dayshaun Whobrey are scheduled for a TEE on Monday MAY 30th with Dr. Margaretann Loveless.  Please arrive at the [facility] (Main Entrance A) at [facility]: [address] [place], [place] [address] at 10 am. (1 hour prior to procedure unless lab work is needed; if lab work is needed arrive 1.5 hours ahead)  DIET: Nothing to eat or drink after midnight except a sip of water with medications (see medication instructions below)  FYI: For your safety, and to allow [place] to monitor your vital signs accurately during the surgery/procedure we request that   if you have artificial nails, gel coating, SNS etc. Please have those removed prior to your surgery/procedure. Not having the nail coverings /polish removed may result in cancellation or delay of your surgery/procedure.  Medication Instructions: Hold: NONE  Labs: TODAY   You must have a responsible person to drive you home and stay in the waiting area during your procedure. Failure to do so could result in cancellation.  Bring your insurance cards.  *Special Note: Every effort is made to have your procedure done on time. Occasionally there are emergencies that occur at the hospital that may cause delays. Please be patient if a delay does occur.   [place]

## 2022-03-13 NOTE — H&P (View-Only) (Signed)
Cardiology Office Note:    Date:  03/13/2022   ID:  William Underwood, DOB 1951-05-03, MRN [id number]  PCP:  [name], MD  Cardiologist:  [name], MD  Electrophysiologist:  Thompson Grayer, MD   Referring MD: [name], MD   Chief Complaint/Reason for Referral: Stroke, prior A. fib ablation, prior pulmonary vein stents for pulmonary vein stenosis  History of Present Illness:    William Underwood is a 71 y.o. male well-known to my clinic.  He previously followed with EP status post pulmonary vein isolation for atrial fibrillation, and followed at the [facility] for pulmonary vein stenosis following A. fib ablation.  He is undergone pulmonary vein stenting with restenosis of pulmonary vein stent, with no cardiopulmonary limitation on prior cardiopulmonary exercise testing.  Unfortunately in August 7062 he had an embolic appearing stroke.  He had continued on anticoagulation after pulmonary vein stenting for 1 year and instructed to stop by [facility] in April 2021.  He has not had a recurrence of atrial fibrillation that he is aware of and monitors his heart rhythm at home with wearable technology.  He has nearly recovered fully from prior CVA. He has had two episodes which concern him for recurrent embolic events. He has had orthostatic symptoms while rising from a seated position, with feeling extremely weak and both arms shaking. In addition he had an episode where he was biking and felt his left arm go numb. Unclear to him if this was related to a traumatic shoulder injury that occurred prior or if it was neurologic in origin. We discussed possible TEE for further evaluation of neurologic symptoms and previously assumed cardioembolic CVA.   The patient denies chest pain, chest pressure, dyspnea at rest or with exertion, palpitations, PND, orthopnea, or leg swelling. Denies cough, fever, chills. Denies nausea, vomiting. Denies syncope or presyncope.    Past Medical History:   Diagnosis Date   Benign prostatic hyperplasia without urinary obstruction 06/17/2014   BPPV (benign paroxysmal positional vertigo) 07/01/2011   Chronic hyperkalemia 05/20/2012   Overview:  Hx of for years   Concussion syndrome 05/15/2011   This seems like a moderately severe concussion with at least 1 minute loss of consciousness in 15-20 minutes of significant confusion including being unable to state his name    DEGENERATIVE JOINT DISEASE, HANDS 10/16/2009   Qualifier: Diagnosis of  By: [name], [name]     Diverticulosis 06/20/2017   Hemorrhoids, external 06/20/2017   Lumbar spondylosis 06/20/2017   Fairly significant L4-L5 facet arthropathy with effusion.  Can consider facet injections and potentially RFA down the road.   NEUROPATHY 03/01/2008   Qualifier: Diagnosis of  By: [name], [name]     Paroxysmal atrial fibrillation ([place])    Pulmonary insufficiency 03/09/2018   moderate PI noted on echo 03/09/18   Pulmonary vein stenosis    left inferior pulmonary vein stenosis   Right inguinal hernia 06/17/2014   Sinus bradycardia    Strain of gluteus medius 01/29/2017   Stroke ([place]) 05/2020   Vertigo     Past Surgical History:  Procedure Laterality Date   ANKLE ARTHROSCOPY     ATRIAL FIBRILLATION ABLATION N/A 05/25/2018   Procedure: ATRIAL FIBRILLATION ABLATION;  Surgeon: Thompson Grayer, MD;  Location: [facility];  Service: Cardiovascular;  Laterality: N/A;   KNEE ARTHROSCOPY Left 11/2021   ROTATOR CUFF REPAIR Left 06/24/2021    Current Medications: Current Meds  Medication Sig   apixaban (ELIQUIS) 5  MG TABS tablet Take 1 tablet (5 mg total) by mouth 2 (two) times daily.   atorvastatin (LIPITOR) 80 MG tablet Take 1 tablet (80 mg total) by mouth daily.   diazepam (VALIUM) 5 MG tablet Take 1 tablet (5 mg total) by mouth daily as needed for anxiety. (Patient taking differently: Take 5 mg by mouth daily as needed for anxiety. 3/13 not used in a long time)   FLUAD QUADRIVALENT 0.5 ML  injection    Multiple Vitamins-Minerals (MULTIVITAMIN WITH MINERALS) tablet Take 1 tablet by mouth daily.    PFIZER COVID-19 VAC BIVALENT injection    tamsulosin (FLOMAX) 0.4 MG CAPS capsule Take 1 capsule (0.4 mg total) by mouth daily.   traMADol (ULTRAM) 50 MG tablet Take 1 tablet (50 mg total) by mouth every 6 (six) hours as needed for moderate pain.   Vitamin D, Ergocalciferol, 2000 units CAPS Take 2,000 Units by mouth daily.      Allergies:   Cefdinir and Codeine   Social History   Tobacco Use   Smoking status: Never   Smokeless tobacco: Never  Vaping Use   Vaping Use: Never used  Substance Use Topics   Alcohol use: Yes    Comment: Occasionally   Drug use: No     Family History: The patient's family history includes Hearing loss in his father. There is no history of Colon cancer.  ROS:   Please see the history of present illness.    All other systems reviewed and are negative.  EKGs/Labs/Other Studies Reviewed:    The following studies were reviewed today:  EKG: n/a  Recent Labs: 09/02/2021: ALT 36; BUN 19; Creatinine, Ser 0.81; Hemoglobin 14.5; Platelets 192.0; Potassium 5.1; Sodium 144; TSH 1.04  Recent Lipid Panel    Component Value Date/Time   CHOL 111 09/02/2021 1006   TRIG 42.0 09/02/2021 1006   HDL 53.00 09/02/2021 1006   CHOLHDL 2 09/02/2021 1006   VLDL 8.4 09/02/2021 1006   LDLCALC 50 09/02/2021 1006   LDLCALC 49 08/30/2020 1006    Physical Exam:    VS:  BP 122/80   Pulse (!) 50   Ht '5\' 7"'$  (1.702 m)   Wt 157 lb 12.8 oz (71.6 kg)   SpO2 99%   BMI 24.71 kg/m     Wt Readings from Last 5 Encounters:  03/13/22 157 lb 12.8 oz (71.6 kg)  02/24/22 158 lb 9.6 oz (71.9 kg)  01/06/22 160 lb 9.6 oz (72.8 kg)  11/26/21 162 lb (73.5 kg)  09/02/21 157 lb (71.2 kg)    Constitutional: No acute distress Eyes: sclera non-icteric, normal conjunctiva and lids ENMT: normal dentition, moist mucous membranes Cardiovascular: regular rhythm, bradycardic, no  murmurs. S1 and S2 normal. No jugular venous distention.  Respiratory: clear to auscultation bilaterally GI : normal bowel sounds, soft and nontender. No distention.   MSK: extremities warm, well perfused. No edema.  NEURO:  moves all extremities. PSYCH: alert and oriented x 3, normal mood and affect.   ASSESSMENT:    1. Paroxysmal atrial fibrillation (HCC)   2. Secondary hypercoagulable state ([place])   3. S/P ablation of atrial fibrillation   4. Pulmonary vein stenosis   5. Cerebrovascular accident (CVA), unspecified mechanism ([place])   6. History of stroke   7. Chronic anticoagulation   8. Episodic lightheadedness   9. Aortic atherosclerosis ([place])   10. Pre-procedure lab exam     PLAN:    CVA Lightheadedness Left arm weakness. -orthostatic vital signs in office unremarkable.  No drop. Recommend compression socks particularly while and after exercising. He has them and will wear. -He continues on Eliquis and atorvastatin 80 mg daily.  Encouraged him to continue these given unclear nidus for stroke.   - concern for recurrent TIA symptoms. Will pursue TEE May 30 for source of stroke. INFORMED CONSENT: After careful review of history and examination, the risks and benefits of transesophageal echocardiogram have been explained. Risks include esophageal damage, perforation (1:10,000 risk), bleeding, tooth fracture, pharyngeal hematoma as well as other potential complications associated with conscious sedation including aspiration, arrhythmia, respiratory failure and death. Alternatives to treatment were discussed, questions were answered. Patient is willing to proceed.   Paroxysmal atrial fibrillation Secondary hypercoagulable state S/p PVI -No definite recurrence.  He should continue to monitor at home with wearable technology. - continue eliquis 5 mg BID. No bleeding events.  Pulmonary vein stenosis -Clinically stable, completed follow up at [facility] in 2022.   Total time of  encounter: 30 minutes total time of encounter, including 20 minutes spent in face-to-face patient care on the date of this encounter. This time includes coordination of care and counseling regarding above mentioned problem list. Remainder of non-face-to-face time involved reviewing chart documents/testing relevant to the patient encounter and documentation in the medical record. I have independently reviewed documentation from referring provider.   Cherlynn Kaiser, MD, [facility]     Medication Adjustments/Labs and Tests Ordered: Current medicines are reviewed at length with the patient today.  Concerns regarding medicines are outlined above.   Orders Placed This Encounter  Procedures   Basic metabolic panel   CBC    No orders of the defined types were placed in this encounter.   Patient Instructions  Medication Instructions:  No Changes In Medications at this time.  *If you need a refill on your cardiac medications before your next appointment, please call your pharmacy*  Lab Work: BLOOD WORK TODAY  If you have labs (blood work) drawn today and your tests are completely normal, you will receive your results only by: [place] (if you have MyChart) OR A paper copy in the mail If you have any lab test that is abnormal or we need to change your treatment, we will call you to review the results.  Testing/Procedures: Your physician has requested that you have a TEE. During a TEE, sound waves are used to create images of your heart. It provides your doctor with information about the size and shape of your heart and how well your heart's chambers and valves are working. In this test, a transducer is attached to the end of a flexible tube that's guided down your throat and into your esophagus (the tube leading from you mouth to your stomach) to get a more detailed image of your heart. You are not awake for the procedure. Please see the instruction sheet given to  you today. For further information please visit [email or web address].  Follow-Up: At [facility], you and your health needs are our priority.  As part of our continuing mission to provide you with exceptional heart care, we have created designated Provider Care Teams.  These Care Teams include your primary Cardiologist (physician) and Advanced Practice Providers (APPs -  Physician Assistants and Nurse Practitioners) who all work together to provide you with the care you need, when you need it.  Your next appointment:   6 month(s)  The format for your next appointment:   In Person  Provider:   Marcina Millard  Margaretann Loveless, MD    Other Instructions  Dear William Underwood are scheduled for a TEE on Monday MAY 30th with Dr. Margaretann Loveless.  Please arrive at the [facility] (Main Entrance A) at [facility]: [address] [place], [place] [address] at 10 am. (1 hour prior to procedure unless lab work is needed; if lab work is needed arrive 1.5 hours ahead)  DIET: Nothing to eat or drink after midnight except a sip of water with medications (see medication instructions below)  FYI: For your safety, and to allow [place] to monitor your vital signs accurately during the surgery/procedure we request that   if you have artificial nails, gel coating, SNS etc. Please have those removed prior to your surgery/procedure. Not having the nail coverings /polish removed may result in cancellation or delay of your surgery/procedure.  Medication Instructions: Hold: NONE  Labs: TODAY   You must have a responsible person to drive you home and stay in the waiting area during your procedure. Failure to do so could result in cancellation.  Bring your insurance cards.  *Special Note: Every effort is made to have your procedure done on time. Occasionally there are emergencies that occur at the hospital that may cause delays. Please be patient if a delay does occur.   [place]

## 2022-03-14 ENCOUNTER — Other Ambulatory Visit: Payer: Self-pay

## 2022-03-14 ENCOUNTER — Encounter (HOSPITAL_COMMUNITY): Payer: Self-pay | Admitting: Internal Medicine

## 2022-03-14 DIAGNOSIS — Z8673 Personal history of transient ischemic attack (TIA), and cerebral infarction without residual deficits: Secondary | ICD-10-CM

## 2022-03-14 NOTE — Progress Notes (Signed)
Attempted to obtain medical history via telephone, unable to reach at this time. HIPAA compliant voicemail message left requesting return call to pre surgical testing department. 

## 2022-03-25 ENCOUNTER — Ambulatory Visit (HOSPITAL_BASED_OUTPATIENT_CLINIC_OR_DEPARTMENT_OTHER): Payer: PPO

## 2022-03-25 ENCOUNTER — Ambulatory Visit (HOSPITAL_COMMUNITY): Payer: PPO | Admitting: Anesthesiology

## 2022-03-25 ENCOUNTER — Encounter (HOSPITAL_COMMUNITY)
Admission: RE | Disposition: A | Discharge status: Home / Self Care | Payer: Self-pay | Source: Home / Self Care | Attending: Internal Medicine

## 2022-03-25 ENCOUNTER — Ambulatory Visit (HOSPITAL_BASED_OUTPATIENT_CLINIC_OR_DEPARTMENT_OTHER): Payer: PPO | Admitting: Anesthesiology

## 2022-03-25 ENCOUNTER — Other Ambulatory Visit (HOSPITAL_COMMUNITY): Payer: Self-pay | Admitting: *Deleted

## 2022-03-25 ENCOUNTER — Ambulatory Visit (HOSPITAL_COMMUNITY)
Admission: RE | Admit: 2022-03-25 | Discharge: 2022-03-25 | Disposition: A | Discharge status: Home / Self Care | Payer: PPO | Attending: Internal Medicine | Admitting: Internal Medicine

## 2022-03-25 ENCOUNTER — Encounter (HOSPITAL_COMMUNITY): Payer: Self-pay | Admitting: Internal Medicine

## 2022-03-25 DIAGNOSIS — I081 Rheumatic disorders of both mitral and tricuspid valves: Secondary | ICD-10-CM | POA: Insufficient documentation

## 2022-03-25 DIAGNOSIS — R42 Dizziness and giddiness: Secondary | ICD-10-CM | POA: Insufficient documentation

## 2022-03-25 DIAGNOSIS — I341 Nonrheumatic mitral (valve) prolapse: Secondary | ICD-10-CM | POA: Diagnosis not present

## 2022-03-25 DIAGNOSIS — D6869 Other thrombophilia: Secondary | ICD-10-CM | POA: Insufficient documentation

## 2022-03-25 DIAGNOSIS — I340 Nonrheumatic mitral (valve) insufficiency: Secondary | ICD-10-CM

## 2022-03-25 DIAGNOSIS — Z8673 Personal history of transient ischemic attack (TIA), and cerebral infarction without residual deficits: Secondary | ICD-10-CM

## 2022-03-25 DIAGNOSIS — Z79899 Other long term (current) drug therapy: Secondary | ICD-10-CM | POA: Diagnosis not present

## 2022-03-25 DIAGNOSIS — I4891 Unspecified atrial fibrillation: Secondary | ICD-10-CM

## 2022-03-25 DIAGNOSIS — J984 Other disorders of lung: Secondary | ICD-10-CM | POA: Diagnosis not present

## 2022-03-25 DIAGNOSIS — I480 Paroxysmal atrial fibrillation: Secondary | ICD-10-CM | POA: Insufficient documentation

## 2022-03-25 DIAGNOSIS — I700 Atherosclerosis of aorta: Secondary | ICD-10-CM | POA: Insufficient documentation

## 2022-03-25 DIAGNOSIS — Z7901 Long term (current) use of anticoagulants: Secondary | ICD-10-CM | POA: Insufficient documentation

## 2022-03-25 DIAGNOSIS — I639 Cerebral infarction, unspecified: Secondary | ICD-10-CM | POA: Diagnosis not present

## 2022-03-25 DIAGNOSIS — R531 Weakness: Secondary | ICD-10-CM | POA: Insufficient documentation

## 2022-03-25 DIAGNOSIS — I083 Combined rheumatic disorders of mitral, aortic and tricuspid valves: Secondary | ICD-10-CM | POA: Diagnosis not present

## 2022-03-25 HISTORY — PX: TEE WITHOUT CARDIOVERSION: SHX5443

## 2022-03-25 HISTORY — PX: BUBBLE STUDY: SHX6837

## 2022-03-25 SURGERY — ECHOCARDIOGRAM, TRANSESOPHAGEAL
Anesthesia: Monitor Anesthesia Care

## 2022-03-25 MED ORDER — LIDOCAINE 2% (20 MG/ML) 5 ML SYRINGE
INTRAMUSCULAR | Status: DC | PRN
  Administered 2022-03-25: 40 mg via INTRAVENOUS
  Administered 2022-03-25: 60 mg via INTRAVENOUS

## 2022-03-25 MED ORDER — EPHEDRINE SULFATE-NACL 50-0.9 MG/10ML-% IV SOSY
PREFILLED_SYRINGE | INTRAVENOUS | Status: DC | PRN
  Administered 2022-03-25 (×5): 5 mg via INTRAVENOUS

## 2022-03-25 MED ORDER — SODIUM CHLORIDE 0.9 % IV SOLN
INTRAVENOUS | Status: AC | PRN
  Administered 2022-03-25: 500 mL via INTRAMUSCULAR

## 2022-03-25 MED ORDER — SODIUM CHLORIDE 0.9 % IV SOLN: INTRAVENOUS | Status: DC

## 2022-03-25 MED ORDER — PHENYLEPHRINE 80 MCG/ML (10ML) SYRINGE FOR IV PUSH (FOR BLOOD PRESSURE SUPPORT)
PREFILLED_SYRINGE | INTRAVENOUS | Status: DC | PRN
  Administered 2022-03-25: 80 ug via INTRAVENOUS

## 2022-03-25 MED ORDER — PROPOFOL 500 MG/50ML IV EMUL
INTRAVENOUS | Status: DC | PRN
  Administered 2022-03-25: 60 ug/kg/min via INTRAVENOUS

## 2022-03-25 MED ORDER — PROPOFOL 10 MG/ML IV BOLUS
INTRAVENOUS | Status: DC | PRN
  Administered 2022-03-25 (×2): 40 mg via INTRAVENOUS

## 2022-03-25 MED ORDER — BUTAMBEN-TETRACAINE-BENZOCAINE 2-2-14 % EX AERO
INHALATION_SPRAY | CUTANEOUS | Status: DC | PRN
  Administered 2022-03-25: 2 via TOPICAL

## 2022-03-25 NOTE — Progress Notes (Signed)
  Echocardiogram 2D Echocardiogram has been performed.  William Underwood 03/25/2022, 11:19 AM

## 2022-03-25 NOTE — Anesthesia Procedure Notes (Signed)
Procedure Name: MAC Date/Time: 03/25/2022 10:27 AM Performed by: Lorie Phenix, CRNA Pre-anesthesia Checklist: Patient identified, Emergency Drugs available, Suction available and Patient being monitored Oxygen Delivery Method: Nasal cannula Placement Confirmation: positive ETCO2

## 2022-03-25 NOTE — Transfer of Care (Signed)
Immediate Anesthesia Transfer of Care Note  Patient: William Underwood  Procedure(s) Performed: TRANSESOPHAGEAL ECHOCARDIOGRAM (TEE) BUBBLE STUDY  Patient Location: Endoscopy Unit  Anesthesia Type:MAC  Level of Consciousness: awake and alert   Airway & Oxygen Therapy: Patient Spontanous Breathing  Post-op Assessment: Report given to RN and Post -op Vital signs reviewed and stable  Post vital signs: Reviewed and stable  Last Vitals:  Vitals Value Taken Time    03/25/22 1112  Temp 36.6 C 03/25/22 1112  Pulse 50 03/25/22 1115  Resp 18 03/25/22 1115  SpO2 94 % 03/25/22 1115  Vitals shown include unvalidated device data.  Last Pain:  Vitals:   03/25/22 1112  TempSrc: Temporal  PainSc: 0-No pain         Complications: No notable events documented.

## 2022-03-25 NOTE — Anesthesia Preprocedure Evaluation (Signed)
Anesthesia Evaluation  Patient identified by MRN, date of birth, ID band  Reviewed: Allergy & Precautions, NPO status , Patient's Chart, lab work & pertinent test results  Airway Mallampati: I  TM Distance: >3 FB Neck ROM: Full    Dental no notable dental hx.    Pulmonary neg pulmonary ROS,    Pulmonary exam normal        Cardiovascular Normal cardiovascular exam+ dysrhythmias Atrial Fibrillation   Pulmonary insufficiency   Neuro/Psych Concussion syndrome CVA    GI/Hepatic negative GI ROS, Neg liver ROS,   Endo/Other  negative endocrine ROS  Renal/GU negative Renal ROS     Musculoskeletal negative musculoskeletal ROS (+)   Abdominal   Peds  Hematology negative hematology ROS (+)   Anesthesia Other Findings stroke  Reproductive/Obstetrics                             Anesthesia Physical Anesthesia Plan  ASA: 2  Anesthesia Plan: MAC   Post-op Pain Management:    Induction: Intravenous  PONV Risk Score and Plan: 1 and Treatment may vary due to age or medical condition  Airway Management Planned: Nasal Cannula  Additional Equipment:   Intra-op Plan:   Post-operative Plan:   Informed Consent: I have reviewed the patients History and Physical, chart, labs and discussed the procedure including the risks, benefits and alternatives for the proposed anesthesia with the patient or authorized representative who has indicated his/her understanding and acceptance.     Dental advisory given  Plan Discussed with: CRNA  Anesthesia Plan Comments:         Anesthesia Quick Evaluation

## 2022-03-25 NOTE — CV Procedure (Signed)
INDICATIONS: Stroke/TIA symptoms  PROCEDURE:   Informed consent was obtained prior to the procedure. The risks, benefits and alternatives for the procedure were discussed and the patient comprehended these risks.  Risks include, but are not limited to, cough, sore throat, vomiting, nausea, somnolence, esophageal and stomach trauma or perforation, bleeding, low blood pressure, aspiration, pneumonia, infection, trauma to the teeth and death.    After a procedural time-out, the oropharynx was anesthetized with 20% benzocaine spray.   During this procedure the patient was administered propofol per anesthesia.  The patient's heart rate, blood pressure, and oxygen saturation were monitored continuously during the procedure. The period of conscious sedation was 30 minutes, of which I was present face-to-face 100% of this time.  The transesophageal probe was inserted in the esophagus and stomach without difficulty and multiple views were obtained.  The patient was kept under observation until the patient left the procedure room.  The patient left the procedure room in stable condition.   Agitated microbubble saline contrast was administered.  COMPLICATIONS:    There were no immediate complications.  FINDINGS:   FORMAL ECHOCARDIOGRAM REPORT PENDING Mild bileaflet mitral valve prolapse with mild MR.  Normal biventricular function. No LA appendage thrombus.  No patent foramen ovale/no ASD, negative agitated saline exam.  RECOMMENDATIONS:     D/c when alert  Time Spent Directly with the Patient:  60 minutes   William Underwood A William Underwood 03/25/2022, 11:09 AM

## 2022-03-25 NOTE — Anesthesia Postprocedure Evaluation (Signed)
Anesthesia Post Note  Patient: William Underwood  Procedure(s) Performed: TRANSESOPHAGEAL ECHOCARDIOGRAM (TEE) BUBBLE STUDY     Patient location during evaluation: Endoscopy Anesthesia Type: MAC Level of consciousness: awake Pain management: pain level controlled Vital Signs Assessment: post-procedure vital signs reviewed and stable Respiratory status: spontaneous breathing, nonlabored ventilation, respiratory function stable and patient connected to nasal cannula oxygen Cardiovascular status: stable and blood pressure returned to baseline Postop Assessment: no apparent nausea or vomiting Anesthetic complications: no   No notable events documented.  Last Vitals:  Vitals:   03/25/22 1132 03/25/22 1135  BP: 117/67   Pulse: (!) 56 (!) 57  Resp: (!) 23 17  Temp:    SpO2: 92% 91%    Last Pain:  Vitals:   03/25/22 1120  TempSrc:   PainSc: 0-No pain                 Rakan Soffer P Martin Smeal

## 2022-03-25 NOTE — Discharge Instructions (Signed)

## 2022-03-25 NOTE — Interval H&P Note (Signed)
History and Physical Interval Note:  03/25/2022 10:25 AM  William Underwood  has presented today for surgery, with the diagnosis of stroke.  The various methods of treatment have been discussed with the patient and family. After consideration of risks, benefits and other options for treatment, the patient has consented to  Procedure(s): TRANSESOPHAGEAL ECHOCARDIOGRAM (TEE) (N/A) as a surgical intervention.  The patient's history has been reviewed, patient examined, no change in status, stable for surgery.  I have reviewed the patient's chart and labs.  Questions were answered to the patient's satisfaction.     [name]

## 2022-03-26 ENCOUNTER — Encounter (HOSPITAL_COMMUNITY): Payer: Self-pay | Admitting: Internal Medicine

## 2022-06-02 ENCOUNTER — Other Ambulatory Visit: Payer: Self-pay | Admitting: *Deleted

## 2022-06-02 ENCOUNTER — Encounter: Payer: Self-pay | Admitting: Family Medicine

## 2022-06-02 DIAGNOSIS — N401 Enlarged prostate with lower urinary tract symptoms: Secondary | ICD-10-CM

## 2022-06-15 ENCOUNTER — Other Ambulatory Visit: Payer: Self-pay | Admitting: Internal Medicine

## 2022-06-15 DIAGNOSIS — E785 Hyperlipidemia, unspecified: Secondary | ICD-10-CM

## 2022-06-19 DIAGNOSIS — L820 Inflamed seborrheic keratosis: Secondary | ICD-10-CM | POA: Diagnosis not present

## 2022-06-19 DIAGNOSIS — D1801 Hemangioma of skin and subcutaneous tissue: Secondary | ICD-10-CM | POA: Diagnosis not present

## 2022-06-19 DIAGNOSIS — L814 Other melanin hyperpigmentation: Secondary | ICD-10-CM | POA: Diagnosis not present

## 2022-06-23 NOTE — Telephone Encounter (Signed)
Send message to [facility] Referral Coordinator

## 2022-07-21 ENCOUNTER — Encounter: Payer: Self-pay | Admitting: *Deleted

## 2022-07-24 DIAGNOSIS — M542 Cervicalgia: Secondary | ICD-10-CM | POA: Diagnosis not present

## 2022-08-11 DIAGNOSIS — N411 Chronic prostatitis: Secondary | ICD-10-CM | POA: Diagnosis not present

## 2022-08-11 DIAGNOSIS — R351 Nocturia: Secondary | ICD-10-CM | POA: Diagnosis not present

## 2022-08-11 DIAGNOSIS — N401 Enlarged prostate with lower urinary tract symptoms: Secondary | ICD-10-CM | POA: Diagnosis not present

## 2022-08-11 DIAGNOSIS — N3943 Post-void dribbling: Secondary | ICD-10-CM | POA: Diagnosis not present

## 2022-09-08 ENCOUNTER — Encounter: Payer: Self-pay | Admitting: Family Medicine

## 2022-09-08 ENCOUNTER — Ambulatory Visit (INDEPENDENT_AMBULATORY_CARE_PROVIDER_SITE_OTHER): Payer: PPO | Admitting: Family Medicine

## 2022-09-08 VITALS — BP 128/72 | HR 51 | Temp 97.3°F | Ht 67.0 in | Wt 162.0 lb

## 2022-09-08 DIAGNOSIS — H8110 Benign paroxysmal vertigo, unspecified ear: Secondary | ICD-10-CM | POA: Diagnosis not present

## 2022-09-08 DIAGNOSIS — I480 Paroxysmal atrial fibrillation: Secondary | ICD-10-CM

## 2022-09-08 DIAGNOSIS — Z125 Encounter for screening for malignant neoplasm of prostate: Secondary | ICD-10-CM

## 2022-09-08 DIAGNOSIS — Z0001 Encounter for general adult medical examination with abnormal findings: Secondary | ICD-10-CM

## 2022-09-08 DIAGNOSIS — M1990 Unspecified osteoarthritis, unspecified site: Secondary | ICD-10-CM | POA: Diagnosis not present

## 2022-09-08 DIAGNOSIS — G8929 Other chronic pain: Secondary | ICD-10-CM

## 2022-09-08 DIAGNOSIS — N400 Enlarged prostate without lower urinary tract symptoms: Secondary | ICD-10-CM | POA: Diagnosis not present

## 2022-09-08 DIAGNOSIS — I639 Cerebral infarction, unspecified: Secondary | ICD-10-CM

## 2022-09-08 DIAGNOSIS — E785 Hyperlipidemia, unspecified: Secondary | ICD-10-CM

## 2022-09-08 DIAGNOSIS — I700 Atherosclerosis of aorta: Secondary | ICD-10-CM | POA: Diagnosis not present

## 2022-09-08 DIAGNOSIS — M542 Cervicalgia: Secondary | ICD-10-CM | POA: Diagnosis not present

## 2022-09-08 DIAGNOSIS — R739 Hyperglycemia, unspecified: Secondary | ICD-10-CM

## 2022-09-08 LAB — COMPREHENSIVE METABOLIC PANEL
ALT: 28 U/L (ref 0–53)
AST: 26 U/L (ref 0–37)
Albumin: 4.2 g/dL (ref 3.5–5.2)
Alkaline Phosphatase: 46 U/L (ref 39–117)
BUN: 14 mg/dL (ref 6–23)
CO2: 31 mEq/L (ref 19–32)
Calcium: 9.9 mg/dL (ref 8.4–10.5)
Chloride: 107 mEq/L (ref 96–112)
Creatinine, Ser: 0.83 mg/dL (ref 0.40–1.50)
GFR: 87.83 mL/min (ref >60.00)
Glucose, Bld: 104 mg/dL — ABNORMAL HIGH (ref 70–99)
Potassium: 5.6 mEq/L — ABNORMAL HIGH (ref 3.5–5.1)
Sodium: 145 mEq/L (ref 135–145)
Total Bilirubin: 1 mg/dL (ref 0.2–1.2)
Total Protein: 6.5 g/dL (ref 6.0–8.3)

## 2022-09-08 LAB — LIPID PANEL
Cholesterol: 130 mg/dL (ref 0–200)
HDL: 56.6 mg/dL (ref >39.00)
LDL Cholesterol: 61 mg/dL (ref 0–99)
NonHDL: 73
Total CHOL/HDL Ratio: 2
Triglycerides: 58 mg/dL (ref 0.0–149.0)
VLDL: 11.6 mg/dL (ref 0.0–40.0)

## 2022-09-08 LAB — CBC
HCT: 44.4 % (ref 39.0–52.0)
Hemoglobin: 14.7 g/dL (ref 13.0–17.0)
MCHC: 33.1 g/dL (ref 30.0–36.0)
MCV: 91.9 fl (ref 78.0–100.0)
Platelets: 163 10*3/uL (ref 150.0–400.0)
RBC: 4.83 Mil/uL (ref 4.22–5.81)
RDW: 13.9 % (ref 11.5–15.5)
WBC: 7.9 10*3/uL (ref 4.0–10.5)

## 2022-09-08 LAB — PSA: PSA: 2.22 ng/mL (ref 0.10–4.00)

## 2022-09-08 LAB — TSH: TSH: 1 u[IU]/mL (ref 0.35–5.50)

## 2022-09-08 LAB — HEMOGLOBIN A1C: Hgb A1c MFr Bld: 5.8 % (ref 4.6–6.5)

## 2022-09-08 MED ORDER — TRAMADOL HCL 50 MG PO TABS: 50.0000 mg | ORAL_TABLET | Freq: Four times a day (QID) | ORAL | 5 refills | Status: DC | PRN

## 2022-09-08 MED ORDER — ATORVASTATIN CALCIUM 80 MG PO TABS: 80.0000 mg | ORAL_TABLET | Freq: Every day | ORAL | 3 refills | Status: DC

## 2022-09-08 NOTE — Progress Notes (Signed)
Chief Complaint:  William Underwood is a 71 y.o. male who presents today for his annual comprehensive physical exam.    Assessment/Plan:  New/Acute Problems: Lightheadedness No red flags.  Has been seen by cardiology for this in the past and recently had echocardiogram.  He will be following up again with cardiology soon.  Chronic Problems Addressed Today: Aortic atherosclerosis (HCC) On Eliquis and statin.  Benign prostatic hyperplasia without urinary obstruction Continue management per urology.  On Flomax 0.4 mg daily.  Has upcoming appointment with them.  He is reluctant to increase dose of Flomax at this point due to concerns for lightheadedness and orthostasis.  BPPV (benign paroxysmal positional vertigo) Mild flare recently though seems to be relatively well controlled at this point.  He is concerned about future flares.  He has done well with vestibular rehab in the past and would like to be referred today.  We will place referral today.  Chronic neck pain Will be following up with orthopedics soon.  Likely has some underlying degenerative changes.  Dyslipidemia Check labs.  He is on Lipitor 80 mg daily and doing well.  Osteoarthritis Uses tramadol sporadically as needed.  Database reviewed without red flags.  Will refill today.  Also recommended Voltaren gel for use on his hands.  Paroxysmal atrial fibrillation (HCC) Regular rate and rhythm today anticoagulated on Eliquis.  Stroke ([place]) No residual deficits.  He is on Lipitor and Eliquis.   Preventative Healthcare: Check labs.  Up-to-date on colon cancer screening and vaccines.   Patient Counseling(The following topics were reviewed and/or handout was given):  -Nutrition: Stressed importance of moderation in sodium/caffeine intake, saturated fat and cholesterol, caloric balance, sufficient intake of fresh fruits, vegetables, and fiber.  -Stressed the importance of regular exercise.   -Substance Abuse: Discussed  cessation/primary prevention of tobacco, alcohol, or other drug use; driving or other dangerous activities under the influence; availability of treatment for abuse.   -Injury prevention: Discussed safety belts, safety helmets, smoke detector, smoking near bedding or upholstery.   -Sexuality: Discussed sexually transmitted diseases, partner selection, use of condoms, avoidance of unintended pregnancy and contraceptive alternatives.   -Dental health: Discussed importance of regular tooth brushing, flossing, and dental visits.  -Health maintenance and immunizations reviewed. Please refer to Health maintenance section.  Return to care in 1 year for next preventative visit.     Subjective:  HPI:  See A/p for status of chronic conditions.   He is still having some orthostatic symptoms. He has discussed with cardiology several months ago and ended up getting a TEE which was reassuring.   Lifestyle Diet: Balanced. Plenty of fruits and vegetables.  Exercise: Riding bikes     09/08/2022    9:18 AM  Depression screen PHQ 2/9  Decreased Interest 0  Down, Depressed, Hopeless 0  PHQ - 2 Score 0    Health Maintenance Due  Topic Date Due   Medicare Annual Wellness (AWV)  09/03/2022     ROS: Per HPI, otherwise a complete review of systems was negative.   PMH:  The following were reviewed and entered/updated in epic: Past Medical History:  Diagnosis Date   Benign prostatic hyperplasia without urinary obstruction 06/17/2014   BPPV (benign paroxysmal positional vertigo) 07/01/2011   Chronic hyperkalemia 05/20/2012   Overview:  Hx of for years   Concussion syndrome 05/15/2011   This seems like a moderately severe concussion with at least 1 minute loss of consciousness in 15-20 minutes of significant confusion including being unable  to state his name    [place], HANDS 10/16/2009   Qualifier: Diagnosis of  By: [name], [name]     Diverticulosis 06/20/2017   Hemorrhoids,  external 06/20/2017   Lumbar spondylosis 06/20/2017   Fairly significant L4-L5 facet arthropathy with effusion.  Can consider facet injections and potentially RFA down the road.   NEUROPATHY 03/01/2008   Qualifier: Diagnosis of  By: [name], [name]     Paroxysmal atrial fibrillation ([place])    Pulmonary insufficiency 03/09/2018   moderate PI noted on echo 03/09/18   Pulmonary vein stenosis    left inferior pulmonary vein stenosis   Right inguinal hernia 06/17/2014   Sinus bradycardia    Strain of gluteus medius 01/29/2017   Stroke ([place]) 05/2020   Vertigo    Patient Active Problem List   Diagnosis Date Noted   History of stroke    Allergic rhinitis 02/24/2022   Dyslipidemia 09/02/2021   Aortic atherosclerosis ([place]) 06/13/2021   Stroke ([place]) 06/22/2020   Arthralgia 07/14/2019   Pulmonary vein stenosis s/p stenting 2020 01/02/2019   Chronic neck pain 08/23/2018   Tinnitus 08/23/2018   Lipoma 08/23/2018   Paroxysmal atrial fibrillation ([place]) 05/25/2018   Diverticulosis 06/20/2017   Hemorrhoids, external 06/20/2017   Benign prostatic hyperplasia without urinary obstruction 06/17/2014   BPPV (benign paroxysmal positional vertigo) 07/01/2011   Osteoarthritis 10/16/2009   NEUROPATHY 03/01/2008   Past Surgical History:  Procedure Laterality Date   ANKLE ARTHROSCOPY     ATRIAL FIBRILLATION ABLATION N/A 05/25/2018   Procedure: ATRIAL FIBRILLATION ABLATION;  Surgeon: Thompson Grayer, MD;  Location: [facility];  Service: Cardiovascular;  Laterality: N/A;   BUBBLE STUDY  03/25/2022   Procedure: BUBBLE STUDY;  Surgeon: [name], MD;  Location: [facility];  Service: Cardiology;;   KNEE ARTHROSCOPY Left 11/2021   ROTATOR CUFF REPAIR Left 06/24/2021   TEE WITHOUT CARDIOVERSION N/A 03/25/2022   Procedure: TRANSESOPHAGEAL ECHOCARDIOGRAM (TEE);  Surgeon: [name], MD;  Location: [facility];  Service: Cardiology;  Laterality: N/A;    Family History  Problem  Relation Age of Onset   Hearing loss Father    Colon cancer Neg Hx     Medications- reviewed and updated Current Outpatient Medications  Medication Sig Dispense Refill   apixaban (ELIQUIS) 5 MG TABS tablet Take 1 tablet (5 mg total) by mouth 2 (two) times daily. 180 tablet 3   Coenzyme Q10 100 MG capsule Take 100 mg by mouth daily.     diazepam (VALIUM) 5 MG tablet Take 1 tablet (5 mg total) by mouth daily as needed for anxiety. (Patient taking differently: Take 5 mg by mouth at bedtime as needed for anxiety.) 30 tablet 1   Multiple Vitamins-Minerals (MULTIVITAMIN WITH MINERALS) tablet Take 1 tablet by mouth daily.      tamsulosin (FLOMAX) 0.4 MG CAPS capsule Take 1 capsule (0.4 mg total) by mouth daily. 90 capsule 3   atorvastatin (LIPITOR) 80 MG tablet Take 1 tablet (80 mg total) by mouth daily. 90 tablet 3   traMADol (ULTRAM) 50 MG tablet Take 1 tablet (50 mg total) by mouth every 6 (six) hours as needed for moderate pain. 30 tablet 5   No current facility-administered medications for this visit.    Allergies-reviewed and updated Allergies  Allergen Reactions   Cefdinir Palpitations and Rash   Codeine Other (See Comments)    Sensitive to this = agitation    Social History   Socioeconomic History   Marital  status: Legally Separated    Spouse name: Not on file   Number of children: Not on file   Years of education: Not on file   Highest education level: Not on file  Occupational History   Occupation: retired  Tobacco Use   Smoking status: Never   Smokeless tobacco: Never  Vaping Use   Vaping Use: Never used  Substance and Sexual Activity   Alcohol use: Yes    Comment: Occasionally   Drug use: No   Sexual activity: Not Currently  Other Topics Concern   Not on file  Social History Narrative   Lives alone   Right Handed   Drinks 3-4 cups caffeine daily   Social Determinants of Health   Financial Resource Strain: Low Risk  (09/03/2021)   Overall Financial Resource  Strain (CARDIA)    Difficulty of Paying Living Expenses: Not hard at all  Food Insecurity: No Food Insecurity (09/03/2021)   Hunger Vital Sign    Worried About Running Out of Food in the Last Year: Never true    [place] in the Last Year: Never true  Transportation Needs: No Transportation Needs (09/03/2021)   PRAPARE - Hydrologist (Medical): No    Lack of Transportation (Non-Medical): No  Physical Activity: Sufficiently Active (09/03/2021)   Exercise Vital Sign    Days of Exercise per Week: 7 days    Minutes of Exercise per Session: 120 min  Stress: No Stress Concern Present (09/03/2021)   [place]    Feeling of Stress : Not at all  Social Connections: Moderately Isolated (09/03/2021)   Social Connection and Isolation Panel [NHANES]    Frequency of Communication with Friends and Family: More than three times a week    Frequency of Social Gatherings with Friends and Family: More than three times a week    Attends Religious Services: Never    Marine scientist or Organizations: Yes    Attends Archivist Meetings: 1 to 4 times per year    Marital Status: Separated        Objective:  Physical Exam: BP 128/72   Pulse (!) 51   Temp (!) 97.3 F (36.3 C) (Temporal)   Ht '5\' 7"'$  (1.702 m)   Wt 162 lb (73.5 kg)   SpO2 97%   BMI 25.37 kg/m   Body mass index is 25.37 kg/m. Wt Readings from Last 3 Encounters:  09/08/22 162 lb (73.5 kg)  03/25/22 152 lb (68.9 kg)  03/13/22 157 lb 12.8 oz (71.6 kg)   Gen: NAD, resting comfortably HEENT: TMs normal bilaterally. OP clear. No thyromegaly noted.  CV: RRR with no murmurs appreciated Pulm: NWOB, CTAB with no crackles, wheezes, or rhonchi GI: Normal bowel sounds present. Soft, Nontender, Nondistended. MSK: no edema, cyanosis, or clubbing noted Skin: warm, dry Neuro: CN2-12 grossly intact. Strength 5/5 in upper and lower  extremities. Reflexes symmetric and intact bilaterally.  Psych: Normal affect and thought content     Jahvier Aldea M. [name], MD 09/08/2022 10:07 AM

## 2022-09-08 NOTE — Assessment & Plan Note (Signed)
Regular rate and rhythm today anticoagulated on Eliquis.

## 2022-09-08 NOTE — Assessment & Plan Note (Signed)
No residual deficits.  He is on Lipitor and Eliquis.

## 2022-09-08 NOTE — Assessment & Plan Note (Signed)
Check labs.  He is on Lipitor 80 mg daily and doing well.

## 2022-09-08 NOTE — Patient Instructions (Signed)
It was very nice to see you today!  We we will check blood work today.  Please try using Voltaren for your hands.  I will refer you to see the vestibular rehab specialist.  We will see you back in year for your next physical.  Come back sooner if needed.  Take care, Dr [name]  PLEASE NOTE:  If you had any lab tests please let us know if you have not heard back within a few days. You may see your results on mychart before we have a chance to review them but we will give you a call once they are reviewed by [place]. If we ordered any referrals today, please let us know if you have not heard from their office within the next week.   Please try these tips to maintain a healthy lifestyle:  Eat at least 3 REAL meals and 1-2 snacks per day.  Aim for no more than 5 hours between eating.  If you eat breakfast, please do so within one hour of getting up.   Each meal should contain half fruits/vegetables, one quarter protein, and one quarter carbs (no bigger than a computer mouse)  Cut down on sweet beverages. This includes juice, soda, and sweet tea.   Drink at least 1 glass of water with each meal and aim for at least 8 glasses per day  Exercise at least 150 minutes every week.    Preventive Care 25 Years and Older, Male Preventive care refers to lifestyle choices and visits with your health care provider that can promote health and wellness. Preventive care visits are also called wellness exams. What can I expect for my preventive care visit? Counseling During your preventive care visit, your health care provider may ask about your: Medical history, including: Past medical problems. Family medical history. History of falls. Current health, including: Emotional well-being. Home life and relationship well-being. Sexual activity. Memory and ability to understand (cognition). Lifestyle, including: Alcohol, nicotine or tobacco, and drug use. Access to firearms. Diet, exercise, and sleep  habits. Work and work Statistician. Sunscreen use. Safety issues such as seatbelt and bike helmet use. Physical exam Your health care provider will check your: Height and weight. These may be used to calculate your BMI (body mass index). BMI is a measurement that tells if you are at a healthy weight. Waist circumference. This measures the distance around your waistline. This measurement also tells if you are at a healthy weight and may help predict your risk of certain diseases, such as type 2 diabetes and high blood pressure. Heart rate and blood pressure. Body temperature. Skin for abnormal spots. What immunizations do I need?  Vaccines are usually given at various ages, according to a schedule. Your health care provider will recommend vaccines for you based on your age, medical history, and lifestyle or other factors, such as travel or where you work. What tests do I need? Screening Your health care provider may recommend screening tests for certain conditions. This may include: Lipid and cholesterol levels. Diabetes screening. This is done by checking your blood sugar (glucose) after you have not eaten for a while (fasting). Hepatitis C test. Hepatitis B test. HIV (human immunodeficiency virus) test. STI (sexually transmitted infection) testing, if you are at risk. Lung cancer screening. Colorectal cancer screening. Prostate cancer screening. Abdominal aortic aneurysm (AAA) screening. You may need this if you are a current or former smoker. Talk with your health care provider about your test results, treatment options, and if necessary,  the need for more tests. Follow these instructions at home: Eating and drinking  Eat a diet that includes fresh fruits and vegetables, whole grains, lean protein, and low-fat dairy products. Limit your intake of foods with high amounts of sugar, saturated fats, and salt. Take vitamin and mineral supplements as recommended by your health care  provider. Do not drink alcohol if your health care provider tells you not to drink. If you drink alcohol: Limit how much you have to 0-2 drinks a day. Know how much alcohol is in your drink. In the [place] one drink equals one 12 oz bottle of beer (355 mL), one 5 oz glass of wine (148 mL), or one 1 oz glass of hard liquor (44 mL). Lifestyle Brush your teeth every morning and night with fluoride toothpaste. Floss one time each day. Exercise for at least 30 minutes 5 or more days each week. Do not use any products that contain nicotine or tobacco. These products include cigarettes, chewing tobacco, and vaping devices, such as e-cigarettes. If you need help quitting, ask your health care provider. Do not use drugs. If you are sexually active, practice safe sex. Use a condom or other form of protection to prevent STIs. Take aspirin only as told by your health care provider. Make sure that you understand how much to take and what form to take. Work with your health care provider to find out whether it is safe and beneficial for you to take aspirin daily. Ask your health care provider if you need to take a cholesterol-lowering medicine (statin). Find healthy ways to manage stress, such as: Meditation, yoga, or listening to music. Journaling. Talking to a trusted person. Spending time with friends and family. Safety Always wear your seat belt while driving or riding in a vehicle. Do not drive: If you have been drinking alcohol. Do not ride with someone who has been drinking. When you are tired or distracted. While texting. If you have been using any mind-altering substances or drugs. Wear a helmet and other protective equipment during sports activities. If you have firearms in your house, make sure you follow all gun safety procedures. Minimize exposure to UV radiation to reduce your risk of skin cancer. What's next? Visit your health care provider once a year for an annual wellness visit. Ask  your health care provider how often you should have your eyes and teeth checked. Stay up to date on all vaccines. This information is not intended to replace advice given to you by your health care provider. Make sure you discuss any questions you have with your health care provider. Document Revised: 04/10/2021 Document Reviewed: 04/10/2021 Elsevier Patient Education  [place].

## 2022-09-08 NOTE — Assessment & Plan Note (Signed)
Continue management per urology.  On Flomax 0.4 mg daily.  Has upcoming appointment with them.  He is reluctant to increase dose of Flomax at this point due to concerns for lightheadedness and orthostasis.

## 2022-09-08 NOTE — Assessment & Plan Note (Signed)
On Eliquis and statin.

## 2022-09-08 NOTE — Assessment & Plan Note (Signed)
Will be following up with orthopedics soon.  Likely has some underlying degenerative changes.

## 2022-09-08 NOTE — Assessment & Plan Note (Signed)
Mild flare recently though seems to be relatively well controlled at this point.  He is concerned about future flares.  He has done well with vestibular rehab in the past and would like to be referred today.  We will place referral today.

## 2022-09-08 NOTE — Assessment & Plan Note (Signed)
Uses tramadol sporadically as needed.  Database reviewed without red flags.  Will refill today.  Also recommended Voltaren gel for use on his hands.

## 2022-09-10 ENCOUNTER — Encounter: Payer: Self-pay | Admitting: Internal Medicine

## 2022-09-10 ENCOUNTER — Ambulatory Visit: Payer: PPO | Attending: Internal Medicine | Admitting: Internal Medicine

## 2022-09-10 VITALS — BP 128/82 | HR 53 | Ht 67.0 in | Wt 160.0 lb

## 2022-09-10 DIAGNOSIS — Q268 Other congenital malformations of great veins: Secondary | ICD-10-CM | POA: Diagnosis not present

## 2022-09-10 DIAGNOSIS — R29898 Other symptoms and signs involving the musculoskeletal system: Secondary | ICD-10-CM

## 2022-09-10 DIAGNOSIS — I700 Atherosclerosis of aorta: Secondary | ICD-10-CM

## 2022-09-10 DIAGNOSIS — D6869 Other thrombophilia: Secondary | ICD-10-CM

## 2022-09-10 DIAGNOSIS — E785 Hyperlipidemia, unspecified: Secondary | ICD-10-CM

## 2022-09-10 DIAGNOSIS — I639 Cerebral infarction, unspecified: Secondary | ICD-10-CM

## 2022-09-10 DIAGNOSIS — I480 Paroxysmal atrial fibrillation: Secondary | ICD-10-CM

## 2022-09-10 DIAGNOSIS — Z8679 Personal history of other diseases of the circulatory system: Secondary | ICD-10-CM | POA: Diagnosis not present

## 2022-09-10 DIAGNOSIS — R42 Dizziness and giddiness: Secondary | ICD-10-CM

## 2022-09-10 DIAGNOSIS — Z9889 Other specified postprocedural states: Secondary | ICD-10-CM

## 2022-09-10 NOTE — Progress Notes (Signed)
Cardiology Office Note:    Date:  09/10/2022   ID:  William Underwood, DOB [birth year], MRN [id number]  PCP:  [name], MD  Cardiologist:  [name], MD  Electrophysiologist:  Thompson Grayer, MD   Referring MD: [name], MD   Chief Complaint/Reason for Referral: Stroke, prior A. fib ablation, prior pulmonary vein stents for pulmonary vein stenosis  History of Present Illness:    William Underwood is a 71 y.o. male well-known to my clinic.  He previously followed with EP status post pulmonary vein isolation for atrial fibrillation, and followed at the [facility] for pulmonary vein stenosis following A. fib ablation.  He is undergone pulmonary vein stenting with restenosis of pulmonary vein stent, with no cardiopulmonary limitation on prior cardiopulmonary exercise testing.  Unfortunately in August 1937 he had an embolic appearing stroke.  He had continued on anticoagulation after pulmonary vein stenting for 1 year and instructed to stop by [facility] in April 2021.  He has not had a recurrence of atrial fibrillation that he is aware of and monitors his heart rhythm at home with wearable technology. At last visit, he had nearly recovered fully from prior CVA. He had two episodes which concern him for recurrent embolic events. He had orthostatic symptoms while rising from a seated position, with feeling extremely weak and both arms shaking. In addition he had an episode where he was biking and felt his left arm go numb. Unclear to him if this was related to a traumatic shoulder injury that occurred prior or if it was neurologic in origin. We performed a TEE for further evaluation of neurologic symptoms and previously assumed cardioembolic CVA, which was unremarkable for cause of stroke. 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   Today: feeling well overall. Mild, continued orthostasis and balance issues. Query whether this is stroke residual moreso than cardiovascular. Has been  very active and overall tolerating exercise well. No increase in dyspnea, no worrisome symptoms for recurrrence or progression of PV stenosis. Discussed follow up imaging as needed in [place], he has chosen not to return annually for [facility] follow up unless concerns arise.   The patient denies chest pain, chest pressure, dyspnea at rest or with exertion, palpitations, PND, orthopnea, or leg swelling. Denies cough, fever, chills. Denies nausea, vomiting. Denies syncope or presyncope. Denies snoring.    Past Medical History:  Diagnosis Date   Benign prostatic hyperplasia without urinary obstruction 06/17/2014   BPPV (benign paroxysmal positional vertigo) 07/01/2011   Chronic hyperkalemia 05/20/2012   Overview:  Hx of for years   Concussion syndrome 05/15/2011   This seems like a moderately severe concussion with at least 1 minute loss of consciousness in 15-20 minutes of significant confusion including being unable to state his name    DEGENERATIVE JOINT DISEASE, HANDS 10/16/2009   Qualifier: Diagnosis of  By: [name], [name]     Diverticulosis 06/20/2017   Hemorrhoids, external 06/20/2017   Lumbar spondylosis 06/20/2017   Fairly significant L4-L5 facet arthropathy with effusion.  Can consider facet injections and potentially RFA down the road.   NEUROPATHY 03/01/2008   Qualifier: Diagnosis of  By: [name], [name]     Paroxysmal atrial fibrillation ([place])    Pulmonary insufficiency 03/09/2018   moderate PI noted on echo 03/09/18   Pulmonary vein stenosis    left inferior pulmonary vein stenosis   Right inguinal hernia 06/17/2014   Sinus bradycardia    Strain of gluteus medius 01/29/2017  Stroke [facility]) 05/2020   Vertigo     Past Surgical History:  Procedure Laterality Date   ANKLE ARTHROSCOPY     ATRIAL FIBRILLATION ABLATION N/A 05/25/2018   Procedure: ATRIAL FIBRILLATION ABLATION;  Surgeon: Thompson Grayer, MD;  Location: [place] [facility];  Service: Cardiovascular;  Laterality: N/A;    BUBBLE STUDY  03/25/2022   Procedure: BUBBLE STUDY;  Surgeon: [name], MD;  Location: [place];  Service: Cardiology;;   KNEE ARTHROSCOPY Left 11/2021   ROTATOR CUFF REPAIR Left 06/24/2021   TEE WITHOUT CARDIOVERSION N/A 03/25/2022   Procedure: TRANSESOPHAGEAL ECHOCARDIOGRAM (TEE);  Surgeon: [name], MD;  Location: [place];  Service: Cardiology;  Laterality: N/A;    Current Medications: Current Meds  Medication Sig   apixaban (ELIQUIS) 5 MG TABS tablet Take 1 tablet (5 mg total) by mouth 2 (two) times daily.   atorvastatin (LIPITOR) 80 MG tablet Take 1 tablet (80 mg total) by mouth daily.   Coenzyme Q10 100 MG capsule Take 100 mg by mouth daily.   diazepam (VALIUM) 5 MG tablet Take 1 tablet (5 mg total) by mouth daily as needed for anxiety. (Patient taking differently: Take 5 mg by mouth at bedtime as needed for anxiety.)   Multiple Vitamins-Minerals (MULTIVITAMIN WITH MINERALS) tablet Take 1 tablet by mouth daily.    tamsulosin (FLOMAX) 0.4 MG CAPS capsule Take 1 capsule (0.4 mg total) by mouth daily.   traMADol (ULTRAM) 50 MG tablet Take 1 tablet (50 mg total) by mouth every 6 (six) hours as needed for moderate pain.     Allergies:   Cefdinir and Codeine   Social History   Tobacco Use   Smoking status: Never   Smokeless tobacco: Never  Vaping Use   Vaping Use: Never used  Substance Use Topics   Alcohol use: Yes    Comment: Occasionally   Drug use: No     Family History: The patient's family history includes Hearing loss in his father. There is no history of Colon cancer.  ROS:   Please see the history of present illness.    All other systems reviewed and are negative.  EKGs/Labs/Other Studies Reviewed:    The following studies were reviewed today:  EKG: sinus bradycardia 53 bpm  Recent Labs: 09/08/2022: Hemoglobin 14.7; Platelets 163.0; TSH 1.00 09/12/2022: ALT 29; BUN 20; Creatinine, Ser 0.85; Potassium 4.2; Sodium 138  Recent  Lipid Panel    Component Value Date/Time   CHOL 130 09/08/2022 1010   TRIG 58.0 09/08/2022 1010   HDL 56.60 09/08/2022 1010   CHOLHDL 2 09/08/2022 1010   VLDL 11.6 09/08/2022 1010   LDLCALC 61 09/08/2022 1010   LDLCALC 49 08/30/2020 1006    Physical Exam:    VS:  BP 128/82   Pulse (!) 53   Ht '5\' 7"'$  (1.702 m)   Wt 160 lb (72.6 kg)   SpO2 95%   BMI 25.06 kg/m     Wt Readings from Last 5 Encounters:  09/10/22 160 lb (72.6 kg)  09/08/22 162 lb (73.5 kg)  03/25/22 152 lb (68.9 kg)  03/13/22 157 lb 12.8 oz (71.6 kg)  02/24/22 158 lb 9.6 oz (71.9 kg)    Constitutional: No acute distress Eyes: sclera non-icteric, normal conjunctiva and lids ENMT: normal dentition, moist mucous membranes Cardiovascular: regular rhythm, bradycardic, no murmurs. S1 and S2 normal. No jugular venous distention.  Respiratory: clear to auscultation bilaterally GI : normal bowel sounds, soft and nontender. No distention.   MSK:  extremities warm, well perfused. No edema.  NEURO:  moves all extremities. PSYCH: alert and oriented x 3, normal mood and affect.   ASSESSMENT:    1. Paroxysmal atrial fibrillation (HCC)   2. Secondary hypercoagulable state ([place])   3. S/P ablation of atrial fibrillation   4. Pulmonary vein stenosis   5. Cerebrovascular accident (CVA), unspecified mechanism ([place])   6. Left arm weakness   7. Episodic lightheadedness   8. Aortic atherosclerosis ([place])   9. Dyslipidemia, goal LDL below 70      PLAN:    CVA Lightheadedness Left arm weakness. -orthostatic vital signs in office unremarkable at last visit. No drop. Recommend compression socks particularly while and after exercising. -He continues on Eliquis and atorvastatin 80 mg daily.  Encouraged him to continue these given unclear nidus for stroke.   - TEE unremarkable for souce of stroke, personally performed.  - no worrisome interval symptoms.   Paroxysmal atrial fibrillation Secondary hypercoagulable state S/p  PVI -No definite recurrence of AF.  He should continue to monitor at home with wearable technology. - continue eliquis 5 mg BID. No bleeding events.  Pulmonary vein stenosis -Clinically stable, completed follow up at [facility] in 2022.   Dyslipidemia - lipids well treated on atorvastatin 80 mg daily, LDL 61. Continue.  Total time of encounter: 30 minutes total time of encounter, including 20 minutes spent in face-to-face patient care on the date of this encounter. This time includes coordination of care and counseling regarding above mentioned problem list. Remainder of non-face-to-face time involved reviewing chart documents/testing relevant to the patient encounter and documentation in the medical record. I have independently reviewed documentation from referring provider.   Cherlynn Kaiser, MD, [facility]     Medication Adjustments/Labs and Tests Ordered: Current medicines are reviewed at length with the patient today.  Concerns regarding medicines are outlined above.   Orders Placed This Encounter  Procedures   EKG 12-Lead    No orders of the defined types were placed in this encounter.   Patient Instructions  Medication Instructions:  No Changes In Medications at this time. *If you need a refill on your cardiac medications before your next appointment, please call your pharmacy*  Lab Work: None Ordered At This Time.  If you have labs (blood work) drawn today and your tests are completely normal, you will receive your results only by: [place] (if you have MyChart) OR A paper copy in the mail If you have any lab test that is abnormal or we need to change your treatment, we will call you to review the results.  Testing/Procedures: None Ordered At This Time.   Follow-Up: At [facility], you and your health needs are our priority.  As part of our continuing mission to provide you with exceptional heart care, we have created designated  Provider Care Teams.  These Care Teams include your primary Cardiologist (physician) and Advanced Practice Providers (APPs -  Physician Assistants and Nurse Practitioners) who all work together to provide you with the care you need, when you need it.  Your next appointment:   6 month(s)  The format for your next appointment:   In Person  Provider:   [name], MD     PLEASE REMEMBER TO [place] DIZZINESS/LIGHTHEADEDNESS

## 2022-09-10 NOTE — Patient Instructions (Signed)
Medication Instructions:  No Changes In Medications at this time. *If you need a refill on your cardiac medications before your next appointment, please call your pharmacy*  Lab Work: None Ordered At This Time.  If you have labs (blood work) drawn today and your tests are completely normal, you will receive your results only by: [place] (if you have MyChart) OR A paper copy in the mail If you have any lab test that is abnormal or we need to change your treatment, we will call you to review the results.  Testing/Procedures: None Ordered At This Time.   Follow-Up: At [facility], you and your health needs are our priority.  As part of our continuing mission to provide you with exceptional heart care, we have created designated Provider Care Teams.  These Care Teams include your primary Cardiologist (physician) and Advanced Practice Providers (APPs -  Physician Assistants and Nurse Practitioners) who all work together to provide you with the care you need, when you need it.  Your next appointment:   6 month(s)  The format for your next appointment:   In Person  Provider:   [name], MD     PLEASE REMEMBER TO [place] DIZZINESS/LIGHTHEADEDNESS

## 2022-09-11 ENCOUNTER — Other Ambulatory Visit: Payer: Self-pay | Admitting: *Deleted

## 2022-09-11 ENCOUNTER — Ambulatory Visit: Payer: PPO | Admitting: Internal Medicine

## 2022-09-11 DIAGNOSIS — E875 Hyperkalemia: Secondary | ICD-10-CM

## 2022-09-11 NOTE — Progress Notes (Signed)
Please inform patient of the following:  Potassium is a little bit high.  This is likely due to a lab error.  Recommend he go back to [facility] to have this redrawn.  The rest of his labs are all stable compared to previous.  He should continue the great work with diet and exercise and we can recheck in a year.

## 2022-09-12 ENCOUNTER — Other Ambulatory Visit (INDEPENDENT_AMBULATORY_CARE_PROVIDER_SITE_OTHER): Payer: PPO

## 2022-09-12 DIAGNOSIS — E875 Hyperkalemia: Secondary | ICD-10-CM | POA: Diagnosis not present

## 2022-09-12 LAB — COMPREHENSIVE METABOLIC PANEL
ALT: 29 U/L (ref 0–53)
AST: 25 U/L (ref 0–37)
Albumin: 4.6 g/dL (ref 3.5–5.2)
Alkaline Phosphatase: 54 U/L (ref 39–117)
BUN: 20 mg/dL (ref 6–23)
CO2: 29 mEq/L (ref 19–32)
Calcium: 9.6 mg/dL (ref 8.4–10.5)
Chloride: 102 mEq/L (ref 96–112)
Creatinine, Ser: 0.85 mg/dL (ref 0.40–1.50)
GFR: 87.19 mL/min (ref >60.00)
Glucose, Bld: 102 mg/dL — ABNORMAL HIGH (ref 70–99)
Potassium: 4.2 mEq/L (ref 3.5–5.1)
Sodium: 138 mEq/L (ref 135–145)
Total Bilirubin: 0.7 mg/dL (ref 0.2–1.2)
Total Protein: 7.4 g/dL (ref 6.0–8.3)

## 2022-09-15 DIAGNOSIS — M67912 Unspecified disorder of synovium and tendon, left shoulder: Secondary | ICD-10-CM | POA: Diagnosis not present

## 2022-09-15 DIAGNOSIS — M4722 Other spondylosis with radiculopathy, cervical region: Secondary | ICD-10-CM | POA: Diagnosis not present

## 2022-09-15 DIAGNOSIS — M25512 Pain in left shoulder: Secondary | ICD-10-CM | POA: Diagnosis not present

## 2022-09-16 ENCOUNTER — Ambulatory Visit (INDEPENDENT_AMBULATORY_CARE_PROVIDER_SITE_OTHER): Payer: PPO

## 2022-09-16 DIAGNOSIS — Z0000 Encounter for general adult medical examination without abnormal findings: Secondary | ICD-10-CM | POA: Diagnosis not present

## 2022-09-16 NOTE — Patient Instructions (Signed)
William Underwood , Thank you for taking time to come for your Medicare Wellness Visit. I appreciate your ongoing commitment to your health goals. Please review the following plan we discussed and let me know if I can assist you in the future.   These are the goals we discussed:  Goals      Patient Stated     None at this time     Patient Stated     Stay healthy and active         This is a list of the screening recommended for you and due dates:  Health Maintenance  Topic Date Due   COVID-19 Vaccine (6 - 2023-24 season) 09/30/2022   Medicare Annual Wellness Visit  09/17/2023   Colon Cancer Screening  03/12/2026   Pneumonia Vaccine  Completed   Flu Shot  Completed   Hepatitis C Screening: USPSTF Recommendation to screen - Ages 18-79 yo.  Completed   Zoster (Shingles) Vaccine  Completed   HPV Vaccine  Aged Out    Advanced directives: Please bring a copy of your health care power of attorney and living will to the office at your convenience.  Conditions/risks identified: stay healthy and active   Next appointment: Follow up in one year for your annual wellness visit.   Preventive Care 88 Years and Older, Male  Preventive care refers to lifestyle choices and visits with your health care provider that can promote health and wellness. What does preventive care include? A yearly physical exam. This is also called an annual well check. Dental exams once or twice a year. Routine eye exams. Ask your health care provider how often you should have your eyes checked. Personal lifestyle choices, including: Daily care of your teeth and gums. Regular physical activity. Eating a healthy diet. Avoiding tobacco and drug use. Limiting alcohol use. Practicing safe sex. Taking low doses of aspirin every day. Taking vitamin and mineral supplements as recommended by your health care provider. What happens during an annual well check? The services and screenings done by your health care provider  during your annual well check will depend on your age, overall health, lifestyle risk factors, and family history of disease. Counseling  Your health care provider may ask you questions about your: Alcohol use. Tobacco use. Drug use. Emotional well-being. Home and relationship well-being. Sexual activity. Eating habits. History of falls. Memory and ability to understand (cognition). Work and work Statistician. Screening  You may have the following tests or measurements: Height, weight, and BMI. Blood pressure. Lipid and cholesterol levels. These may be checked every 5 years, or more frequently if you are over 2 years old. Skin check. Lung cancer screening. You may have this screening every year starting at age 28 if you have a 30-pack-year history of smoking and currently smoke or have quit within the past 15 years. Fecal occult blood test (FOBT) of the stool. You may have this test every year starting at age 96. Flexible sigmoidoscopy or colonoscopy. You may have a sigmoidoscopy every 5 years or a colonoscopy every 10 years starting at age 73. Prostate cancer screening. Recommendations will vary depending on your family history and other risks. Hepatitis C blood test. Hepatitis B blood test. Sexually transmitted disease (STD) testing. Diabetes screening. This is done by checking your blood sugar (glucose) after you have not eaten for a while (fasting). You may have this done every 1-3 years. Abdominal aortic aneurysm (AAA) screening. You may need this if you are a current or  former smoker. Osteoporosis. You may be screened starting at age 30 if you are at high risk. Talk with your health care provider about your test results, treatment options, and if necessary, the need for more tests. Vaccines  Your health care provider may recommend certain vaccines, such as: Influenza vaccine. This is recommended every year. Tetanus, diphtheria, and acellular pertussis (Tdap, Td) vaccine. You  may need a Td booster every 10 years. Zoster vaccine. You may need this after age 30. Pneumococcal 13-valent conjugate (PCV13) vaccine. One dose is recommended after age 80. Pneumococcal polysaccharide (PPSV23) vaccine. One dose is recommended after age 79. Talk to your health care provider about which screenings and vaccines you need and how often you need them. This information is not intended to replace advice given to you by your health care provider. Make sure you discuss any questions you have with your health care provider. Document Released: 11/09/2015 Document Revised: 07/02/2016 Document Reviewed: 08/14/2015 Elsevier Interactive Patient Education  2017 [place] Prevention in the Home Falls can cause injuries. They can happen to people of all ages. There are many things you can do to make your home safe and to help prevent falls. What can I do on the outside of my home? Regularly fix the edges of walkways and driveways and fix any cracks. Remove anything that might make you trip as you walk through a door, such as a raised step or threshold. Trim any bushes or trees on the path to your home. Use bright outdoor lighting. Clear any walking paths of anything that might make someone trip, such as rocks or tools. Regularly check to see if handrails are loose or broken. Make sure that both sides of any steps have handrails. Any raised decks and porches should have guardrails on the edges. Have any leaves, snow, or ice cleared regularly. Use sand or salt on walking paths during winter. Clean up any spills in your garage right away. This includes oil or grease spills. What can I do in the bathroom? Use night lights. Install grab bars by the toilet and in the tub and shower. Do not use towel bars as grab bars. Use non-skid mats or decals in the tub or shower. If you need to sit down in the shower, use a plastic, non-slip stool. Keep the floor dry. Clean up any water that spills  on the floor as soon as it happens. Remove soap buildup in the tub or shower regularly. Attach bath mats securely with double-sided non-slip rug tape. Do not have throw rugs and other things on the floor that can make you trip. What can I do in the bedroom? Use night lights. Make sure that you have a light by your bed that is easy to reach. Do not use any sheets or blankets that are too big for your bed. They should not hang down onto the floor. Have a firm chair that has side arms. You can use this for support while you get dressed. Do not have throw rugs and other things on the floor that can make you trip. What can I do in the kitchen? Clean up any spills right away. Avoid walking on wet floors. Keep items that you use a lot in easy-to-reach places. If you need to reach something above you, use a strong step stool that has a grab bar. Keep electrical cords out of the way. Do not use floor polish or wax that makes floors slippery. If you must use wax, use  non-skid floor wax. Do not have throw rugs and other things on the floor that can make you trip. What can I do with my stairs? Do not leave any items on the stairs. Make sure that there are handrails on both sides of the stairs and use them. Fix handrails that are broken or loose. Make sure that handrails are as long as the stairways. Check any carpeting to make sure that it is firmly attached to the stairs. Fix any carpet that is loose or worn. Avoid having throw rugs at the top or bottom of the stairs. If you do have throw rugs, attach them to the floor with carpet tape. Make sure that you have a light switch at the top of the stairs and the bottom of the stairs. If you do not have them, ask someone to add them for you. What else can I do to help prevent falls? Wear shoes that: Do not have high heels. Have rubber bottoms. Are comfortable and fit you well. Are closed at the toe. Do not wear sandals. If you use a stepladder: Make  sure that it is fully opened. Do not climb a closed stepladder. Make sure that both sides of the stepladder are locked into place. Ask someone to hold it for you, if possible. Clearly mark and make sure that you can see: Any grab bars or handrails. First and last steps. Where the edge of each step is. Use tools that help you move around (mobility aids) if they are needed. These include: Canes. Walkers. Scooters. Crutches. Turn on the lights when you go into a dark area. Replace any light bulbs as soon as they burn out. Set up your furniture so you have a clear path. Avoid moving your furniture around. If any of your floors are uneven, fix them. If there are any pets around you, be aware of where they are. Review your medicines with your doctor. Some medicines can make you feel dizzy. This can increase your chance of falling. Ask your doctor what other things that you can do to help prevent falls. This information is not intended to replace advice given to you by your health care provider. Make sure you discuss any questions you have with your health care provider. Document Released: 08/09/2009 Document Revised: 03/20/2016 Document Reviewed: 11/17/2014 Elsevier Interactive Patient Education  2017 [employer].

## 2022-09-16 NOTE — Progress Notes (Addendum)
I connected with  [name] on 09/16/22 by a audio enabled telemedicine application and verified that I am speaking with the correct person using two identifiers.  Patient Location: Home  Provider Location: Office/Clinic  I discussed the limitations of evaluation and management by telemedicine. The patient expressed understanding and agreed to proceed.   Subjective:   William Underwood is a 71 y.o. male who presents for Medicare Annual/Subsequent preventive examination.  Review of Systems     Cardiac Risk Factors include: advanced age (>78mn, >>66women);male gender;dyslipidemia     Objective:    There were no vitals filed for this visit. There is no height or weight on file to calculate BMI.     09/16/2022    9:45 AM 03/25/2022   10:06 AM 09/03/2021    9:36 AM 03/18/2021    7:59 PM 03/12/2021    7:31 PM 01/19/2021    2:50 PM 07/16/2020    1:21 PM  Advanced Directives  Does Patient Have a Medical Advance Directive? Yes Yes Yes No No No Yes  Type of [place]amed[place]of [place] will Healthcare Power of [place]odus Pointin Chart? No - copy requested No - copy requested No - copy requested      Would patient like information on creating a medical advance directive?    No - Patient declined       Current Medications (verified) Outpatient Encounter Medications as of 09/16/2022  Medication Sig   apixaban (ELIQUIS) 5 MG TABS tablet Take 1 tablet (5 mg total) by mouth 2 (two) times daily.   atorvastatin (LIPITOR) 80 MG tablet Take 1 tablet (80 mg total) by mouth daily.   Coenzyme Q10 100 MG capsule Take 100 mg by mouth daily.   diazepam (VALIUM) 5 MG tablet Take 1 tablet (5 mg total) by mouth daily as needed for anxiety. (Patient taking differently: Take 5 mg by mouth at bedtime as needed for anxiety.)   Multiple Vitamins-Minerals (MULTIVITAMIN WITH MINERALS) tablet Take 1 tablet by mouth daily.     tamsulosin (FLOMAX) 0.4 MG CAPS capsule Take 1 capsule (0.4 mg total) by mouth daily.   traMADol (ULTRAM) 50 MG tablet Take 1 tablet (50 mg total) by mouth every 6 (six) hours as needed for moderate pain.   [DISCONTINUED] atorvastatin (LIPITOR) 80 MG tablet Take 1 tablet (80 mg total) by mouth daily.   [DISCONTINUED] FLUAD QUADRIVALENT 0.5 ML injection    [DISCONTINUED] PFIZER COVID-19 VAC BIVALENT injection    [DISCONTINUED] traMADol (ULTRAM) 50 MG tablet Take 1 tablet (50 mg total) by mouth every 6 (six) hours as needed for moderate pain.   [DISCONTINUED] Vitamin D, Ergocalciferol, 2000 units CAPS Take 2,000 Units by mouth daily.    No facility-administered encounter medications on file as of 09/16/2022.    Allergies (verified) Cefdinir and Codeine   History: Past Medical History:  Diagnosis Date   Benign prostatic hyperplasia without urinary obstruction 06/17/2014   BPPV (benign paroxysmal positional vertigo) 07/01/2011   Chronic hyperkalemia 05/20/2012   Overview:  Hx of for years   Concussion syndrome 05/15/2011   This seems like a moderately severe concussion with at least 1 minute loss of consciousness in 15-20 minutes of significant confusion including being unable to state his name    DCody HANDS 10/16/2009   Qualifier: Diagnosis of  By: FOneida AlarMD, [name]     Diverticulosis 06/20/2017  Hemorrhoids, external 06/20/2017   Lumbar spondylosis 06/20/2017   Fairly significant L4-L5 facet arthropathy with effusion.  Can consider facet injections and potentially RFA down the road.   NEUROPATHY 03/01/2008   Qualifier: Diagnosis of  By: [name], [name]     Paroxysmal atrial fibrillation ([place])    Pulmonary insufficiency 03/09/2018   moderate PI noted on echo 03/09/18   Pulmonary vein stenosis    left inferior pulmonary vein stenosis   Right inguinal hernia 06/17/2014   Sinus bradycardia    Strain of gluteus medius 01/29/2017   Stroke ([place]) 05/2020    Vertigo    Past Surgical History:  Procedure Laterality Date   ANKLE ARTHROSCOPY     ATRIAL FIBRILLATION ABLATION N/A 05/25/2018   Procedure: ATRIAL FIBRILLATION ABLATION;  Surgeon: Thompson Grayer, MD;  Location: [place] [facility];  Service: Cardiovascular;  Laterality: N/A;   BUBBLE STUDY  03/25/2022   Procedure: BUBBLE STUDY;  Surgeon: [name], MD;  Location: [place];  Service: Cardiology;;   KNEE ARTHROSCOPY Left 11/2021   ROTATOR CUFF REPAIR Left 06/24/2021   TEE WITHOUT CARDIOVERSION N/A 03/25/2022   Procedure: TRANSESOPHAGEAL ECHOCARDIOGRAM (TEE);  Surgeon: [name], MD;  Location: [facility];  Service: Cardiology;  Laterality: N/A;   Family History  Problem Relation Age of Onset   Hearing loss Father    Colon cancer Neg Hx    Social History   Socioeconomic History   Marital status: Legally Separated    Spouse name: Not on file   Number of children: Not on file   Years of education: Not on file   Highest education level: Not on file  Occupational History   Occupation: retired  Tobacco Use   Smoking status: Never   Smokeless tobacco: Never  Vaping Use   Vaping Use: Never used  Substance and Sexual Activity   Alcohol use: Yes    Comment: Occasionally   Drug use: No   Sexual activity: Not Currently  Other Topics Concern   Not on file  Social History Narrative   Lives alone   Right Handed   Drinks 3-4 cups caffeine daily   Social Determinants of Health   Financial Resource Strain: Low Risk  (09/16/2022)   Overall Financial Resource Strain (CARDIA)    Difficulty of Paying Living Expenses: Not hard at all  Food Insecurity: No Food Insecurity (09/16/2022)   Hunger Vital Sign    Worried About Running Out of Food in the Last Year: Never true    [place] in the Last Year: Never true  Transportation Needs: No Transportation Needs (09/16/2022)   PRAPARE - Hydrologist (Medical): No    Lack of  Transportation (Non-Medical): No  Physical Activity: Sufficiently Active (09/16/2022)   Exercise Vital Sign    Days of Exercise per Week: 7 days    Minutes of Exercise per Session: 150+ min  Stress: No Stress Concern Present (09/16/2022)   [place]    Feeling of Stress : Not at all  Social Connections: Moderately Isolated (09/16/2022)   Social Connection and Isolation Panel [NHANES]    Frequency of Communication with Friends and Family: More than three times a week    Frequency of Social Gatherings with Friends and Family: More than three times a week    Attends Religious Services: 1 to 4 times per year    Active Member of Clubs or Organizations: No  Attends Archivist Meetings: Never    Marital Status: Separated    Tobacco Counseling Counseling given: Not Answered   Clinical Intake:  Pre-visit preparation completed: Yes  Pain : No/denies pain     BMI - recorded: 25.06 Nutritional Status: BMI 25 -29 Overweight Nutritional Risks: None Diabetes: No  How often do you need to have someone help you when you read instructions, pamphlets, or other written materials from your doctor or pharmacy?: 1 - Never  Diabetic?no  Interpreter Needed?: No  Information entered by :: Charlott Rakes, LPN   Activities of Daily Living    09/16/2022    9:46 AM  In your present state of health, do you have any difficulty performing the following activities:  Hearing? 0  Vision? 0  Difficulty concentrating or making decisions? 0  Walking or climbing stairs? 0  Dressing or bathing? 0  Doing errands, shopping? 0  Preparing Food and eating ? N  Using the Toilet? N  In the past six months, have you accidently leaked urine? N  Do you have problems with loss of bowel control? N  Managing your Medications? N  Managing your Finances? N  Housekeeping or managing your Housekeeping? N    Patient Care  Team: [name], MD as PCP - General (Family Medicine) [name], MD as PCP - Cardiology (Cardiology) Thompson Grayer, MD as PCP - Electrophysiology (Cardiology) Thompson Grayer, MD as Consulting Physician (Cardiology) Garvin Fila, MD as Consulting Physician (Neurology)  Indicate any recent Medical Services you may have received from other than [facility] providers in the past year (date may be approximate).     Assessment:   This is a routine wellness examination for Dylin.  Hearing/Vision screen Hearing Screening - Comments:: Pt denies any hearing issues  Vision Screening - Comments:: Pt follows up with Dr Valetta Close   Dietary issues and exercise activities discussed: Current Exercise Habits: Home exercise routine, Type of exercise: Other - see comments, Time (Minutes): > 60, Frequency (Times/Week): 7, Weekly Exercise (Minutes/Week): 0   Goals Addressed             This Visit's Progress    Patient Stated       Stay healthy and active        Depression Screen    09/16/2022    9:43 AM 09/08/2022    9:18 AM 09/03/2021    9:36 AM 09/02/2021    9:17 AM 08/30/2020    9:24 AM 07/14/2019   11:15 AM 08/23/2018   11:16 AM  PHQ 2/9 Scores  PHQ - 2 Score 0 0 0 0 4 0 0  PHQ- 9 Score     8 6     Fall Risk    09/16/2022    9:45 AM 09/08/2022    9:18 AM 09/03/2021    9:37 AM 09/02/2021    9:17 AM 08/30/2020    9:19 AM  Fall Risk   Falls in the past year? 0 0 0 0 0  Number falls in past yr: 0 0 0 0 0  Injury with Fall? 0 0 0 0 0  Risk for fall due to : Impaired vision No Fall Risks Impaired vision No Fall Risks   Follow up Falls prevention discussed  Falls prevention discussed      FALL RISK PREVENTION PERTAINING TO THE HOME:  Any stairs in or around the home? Yes  If so, are there any without handrails? No  Home free of loose throw  rugs in walkways, pet beds, electrical cords, etc? Yes  Adequate lighting in your home to reduce risk of falls? Yes   ASSISTIVE  DEVICES UTILIZED TO PREVENT FALLS:  Life alert? Yes apple watch  Use of a cane, walker or w/c? No  Grab bars in the bathroom? No  Shower chair or bench in shower? No  Elevated toilet seat or a handicapped toilet? No   TIMED UP AND GO:  Was the test performed? No .   Cognitive Function:        09/16/2022    9:46 AM 09/03/2021    9:39 AM  6CIT Screen  What Year? 0 points 0 points  What month? 0 points 0 points  What time? 0 points 0 points  Count back from 20 0 points 0 points  Months in reverse 0 points 0 points  Repeat phrase 0 points 0 points  Total Score 0 points 0 points    Immunizations Immunization History  Administered Date(s) Administered   Fluad Quad(high Dose 65+) 07/09/2019, 08/09/2021   Influenza Split 07/27/2018   Influenza Whole 07/23/2018   Influenza, High Dose Seasonal PF 07/13/2017, 07/23/2018, 08/05/2022   Influenza, Seasonal, Injecte, Preservative Fre 08/01/2014   Influenza,inj,Quad PF,6+ Mos 08/13/2015   Influenza-Unspecified 08/09/2021   PFIZER Comirnaty(Gray Top)Covid-19 Tri-Sucrose Vaccine 08/05/2022   PFIZER(Purple Top)SARS-COV-2 Vaccination 11/19/2019, 12/10/2019, 08/07/2020   Pfizer Covid-19 Vaccine Bivalent Booster 67yr & up 08/12/2021   Pneumococcal Conjugate-13 08/23/2018   Pneumococcal Polysaccharide-23 07/13/2016, 07/27/2018   Pneumococcal-Unspecified 10/27/2011   Respiratory Syncytial Virus Vaccine,Recomb Aduvanted(Arexvy) 08/05/2022   Zoster Recombinat (Shingrix) 08/26/2019, 11/07/2019   Zoster, Live 10/27/2011    TDAP status: Due, Education has been provided regarding the importance of this vaccine. Advised may receive this vaccine at local pharmacy or Health Dept. Aware to provide a copy of the vaccination record if obtained from local pharmacy or Health Dept. Verbalized acceptance and understanding.  Flu Vaccine status: Up to date  Pneumococcal vaccine status: Up to date  Covid-19 vaccine status: Completed  vaccines  Qualifies for Shingles Vaccine? Yes   Zostavax completed Yes   Shingrix Completed?: Yes  Screening Tests Health Maintenance  Topic Date Due   COVID-19 Vaccine (6 - 2023-24 season) 09/30/2022   Medicare Annual Wellness (AWV)  09/17/2023   COLONOSCOPY (Pts 45-456yrInsurance coverage will need to be confirmed)  03/12/2026   Pneumonia Vaccine 6572Years old  Completed   INFLUENZA VACCINE  Completed   Hepatitis C Screening  Completed   Zoster Vaccines- Shingrix  Completed   HPV VACCINES  Aged Out    Health Maintenance  There are no preventive care reminders to display for this patient.   Colorectal cancer screening: Type of screening: Colonoscopy. Completed 03/12/16. Repeat every 10 years   Additional Screening:  Hepatitis C Screening:  Completed 08/23/18  Vision Screening: Recommended annual ophthalmology exams for early detection of glaucoma and other disorders of the eye. Is the patient up to date with their annual eye exam?  Yes  Who is the provider or what is the name of the office in which the patient attends annual eye exams? Dr BoValetta CloseIf pt is not established with a provider, would they like to be referred to a provider to establish care? No .   Dental Screening: Recommended annual dental exams for proper oral hygiene  Community Resource Referral / Chronic Care Management: CRR required this visit?  No   CCM required this visit?  No      Plan:  I have personally reviewed and noted the following in the patient's chart:   Medical and social history Use of alcohol, tobacco or illicit drugs  Current medications and supplements including opioid prescriptions. Patient is not currently taking opioid prescriptions. Functional ability and status Nutritional status Physical activity Advanced directives List of other physicians Hospitalizations, surgeries, and ER visits in previous 12 months Vitals Screenings to include cognitive, depression, and  falls Referrals and appointments  In addition, I have reviewed and discussed with patient certain preventive protocols, quality metrics, and best practice recommendations. A written personalized care plan for preventive services as well as general preventive health recommendations were provided to patient.     Willette Brace, LPN   05/29/2335   Nurse Notes: none

## 2022-09-16 NOTE — Progress Notes (Signed)
Please inform patient of the following:  Potassium back to normal.  Do not need to do any other testing at this point.

## 2022-09-17 ENCOUNTER — Telehealth: Payer: Self-pay

## 2022-09-17 NOTE — Telephone Encounter (Signed)
LMOM for pt to CB in regards to labs 

## 2022-09-24 DIAGNOSIS — M542 Cervicalgia: Secondary | ICD-10-CM | POA: Diagnosis not present

## 2022-09-30 DIAGNOSIS — M4692 Unspecified inflammatory spondylopathy, cervical region: Secondary | ICD-10-CM | POA: Diagnosis not present

## 2022-10-13 DIAGNOSIS — M47812 Spondylosis without myelopathy or radiculopathy, cervical region: Secondary | ICD-10-CM | POA: Diagnosis not present

## 2022-11-04 ENCOUNTER — Telehealth: Payer: Self-pay | Admitting: *Deleted

## 2022-11-04 ENCOUNTER — Encounter: Payer: Self-pay | Admitting: *Deleted

## 2022-11-04 ENCOUNTER — Encounter: Payer: Self-pay | Admitting: Internal Medicine

## 2022-11-04 NOTE — Patient Outreach (Signed)
  Care Coordination   Initial Visit Note   11/04/2022 Name: William Underwood MRN: [id number] DOB: 1951/07/12  William Underwood is a 72 y.o. year old male who sees [name], MD for primary care. I spoke with  William Underwood by phone today.  What matters to the patients health and wellness today?  No needs    Goals Addressed             This Visit's Progress    COMPLETED: care coordination activity       Care Coordination Interventions: Reviewed medications with patient and discussed adherence with no needed refills Reviewed scheduled/upcoming provider appointments including sufficient transportation source. Screening for signs and symptoms of depression related to chronic disease state  Assessed social determinant of health barriers Educated on care management services related to social workers, pharmacy and a Designer, jewellery. No needs presented today.         SDOH assessments and interventions completed:  Yes  SDOH Interventions Today    Flowsheet Row Most Recent Value  SDOH Interventions   Food Insecurity Interventions Intervention Not Indicated  Housing Interventions Intervention Not Indicated  Transportation Interventions Intervention Not Indicated  Utilities Interventions Intervention Not Indicated        Care Coordination Interventions:  Yes, provided   Follow up plan: No further intervention required.   Encounter Outcome:  Pt. Visit Completed   Raina Mina, RN Care Management Coordinator [place] Office [phone]

## 2022-11-04 NOTE — Patient Instructions (Signed)
Visit Information  Thank you for taking time to visit with me today. Please don't hesitate to contact me if I can be of assistance to you.   Following are the goals we discussed today:   Goals Addressed             This Visit's Progress    COMPLETED: care coordination activity       Care Coordination Interventions: Reviewed medications with patient and discussed adherence with no needed refills Reviewed scheduled/upcoming provider appointments including sufficient transportation source. Screening for signs and symptoms of depression related to chronic disease state  Assessed social determinant of health barriers Educated on care management services related to social workers, pharmacy and a Designer, jewellery. No needs presented today.         Please call the care guide team at [phone] if you need to cancel or reschedule your appointment.   If you are experiencing a Mental Health or [place] or need someone to talk to, please call the Suicide and Crisis Lifeline: 988  Patient verbalizes understanding of instructions and care plan provided today and agrees to view in [place]. Active MyChart status and patient understanding of how to access instructions and care plan via MyChart confirmed with patient.     No further follow up required: No needs  Raina Mina, RN Care Management Coordinator [place] Office [phone]

## 2022-11-29 ENCOUNTER — Encounter: Payer: Self-pay | Admitting: Internal Medicine

## 2022-11-29 ENCOUNTER — Other Ambulatory Visit: Payer: Self-pay | Admitting: Family Medicine

## 2022-11-29 DIAGNOSIS — I480 Paroxysmal atrial fibrillation: Secondary | ICD-10-CM

## 2022-12-01 MED ORDER — APIXABAN 5 MG PO TABS: 5.0000 mg | ORAL_TABLET | Freq: Two times a day (BID) | ORAL | 1 refills | Status: DC

## 2022-12-08 ENCOUNTER — Encounter: Payer: Self-pay | Admitting: Family Medicine

## 2022-12-08 ENCOUNTER — Ambulatory Visit (INDEPENDENT_AMBULATORY_CARE_PROVIDER_SITE_OTHER): Payer: PPO | Admitting: Family Medicine

## 2022-12-08 VITALS — BP 122/78 | HR 54 | Temp 97.8°F | Ht 67.0 in | Wt 160.4 lb

## 2022-12-08 DIAGNOSIS — I480 Paroxysmal atrial fibrillation: Secondary | ICD-10-CM | POA: Diagnosis not present

## 2022-12-08 DIAGNOSIS — G4730 Sleep apnea, unspecified: Secondary | ICD-10-CM | POA: Diagnosis not present

## 2022-12-08 NOTE — Assessment & Plan Note (Addendum)
Concern for possible OSA based on his nocturnal awakenings and intermittent drops in.  We discussed potential benefits of being diagnosed and treated for sleep apnea.  He is agreeable.  Will place referral for sleep study.

## 2022-12-08 NOTE — Assessment & Plan Note (Signed)
Continue management per cardiology.  Anticoagulated on Eliquis.

## 2022-12-08 NOTE — Telephone Encounter (Signed)
Patient has OV with PCP today

## 2022-12-08 NOTE — Patient Instructions (Addendum)
It was very nice to see you today!  I think getting a sleep study is a good idea.  Will place this referral today.  Please let us know if you have any shortness of breath or any other symptoms of a low oxygen saturation.  Take care, Dr [name]  PLEASE NOTE:  If you had any lab tests, please let us know if you have not heard back within a few days. You may see your results on mychart before we have a chance to review them but we will give you a call once they are reviewed by [place].   If we ordered any referrals today, please let us know if you have not heard from their office within the next week.   If you had any urgent prescriptions sent in today, please check with the pharmacy within an hour of our visit to make sure the prescription was transmitted appropriately.   Please try these tips to maintain a healthy lifestyle:  Eat at least 3 REAL meals and 1-2 snacks per day.  Aim for no more than 5 hours between eating.  If you eat breakfast, please do so within one hour of getting up.   Each meal should contain half fruits/vegetables, one quarter protein, and one quarter carbs (no bigger than a computer mouse)  Cut down on sweet beverages. This includes juice, soda, and sweet tea.   Drink at least 1 glass of water with each meal and aim for at least 8 glasses per day  Exercise at least 150 minutes every week.

## 2022-12-08 NOTE — Progress Notes (Addendum)
   William Underwood is a 72 y.o. male who presents today for an office visit.  Assessment/Plan:  New/Acute Problems: Low O2 Sat O2 sat here is normal.  He is having some low readings at night and we are evaluating for sleep apnea as below.  His postexercise low readings may be falsely low as he is not having any symptoms.  May be related to decreased peripheral perfusion after exertion. His exertional capacity has been stable and he has maintained his normal activity levels with running and rowing.  Given that he has not have any symptoms we will continue with watchful waiting.  We discussed reasons to return to care.  Would consider referral or having him follow-up with pulmonology if any new symptoms arise.  Chronic Problems Addressed Today: Sleep-disordered breathing Concern for possible OSA based on his nocturnal awakenings and intermittent drops in.  We discussed potential benefits of being diagnosed and treated for sleep apnea.  He is agreeable.  Will place referral for sleep study.  Paroxysmal atrial fibrillation (HCC) Continue management per cardiology.  Anticoagulated on Eliquis.     Subjective:  HPI:  See A/P for status of chronic conditions.  Patient's main concern today is low O2.   HE has also noticed that his oxygen saturation will drop after sitting for a period of time into the low 90s and sometimes upper 80s. He will then move around and his numbers will return to normal range. No symptoms   He has also noticed that when he is sleeping his oxygen saturation will drop into the 80s. He has noticed that he has been more restless with his sleep recently. He has an app that shows he is only getting about 8 minutes of deep sleep on average and has several episodes nightly of deep O2 saturation drops. He does not snore though has been told he has abnormal breathing patterns when sleeping. Does not feel like his sleep his refreshing.  He has still been active with his running and  rowing.        Objective:  Physical Exam: BP 122/78   Pulse (!) 54   Temp 97.8 F (36.6 C) (Temporal)   Ht '5\' 7"'$  (1.702 m)   Wt 160 lb 6.4 oz (72.8 kg)   SpO2 97%   BMI 25.12 kg/m   Gen: No acute distress, resting comfortably Neuro: Grossly normal, moves all extremities Psych: Normal affect and thought content  Time Spent: 32 minutes of total time was spent on the date of the encounter performing the following actions: chart review prior to seeing the patient, obtaining history, performing a medically necessary exam, counseling on the treatment plan, placing orders, and documenting in our EHR.        William Underwood. [name], MD 12/08/2022 11:44 AM

## 2023-01-13 ENCOUNTER — Ambulatory Visit (INDEPENDENT_AMBULATORY_CARE_PROVIDER_SITE_OTHER): Payer: PPO | Admitting: Family Medicine

## 2023-01-13 ENCOUNTER — Encounter: Payer: Self-pay | Admitting: Family Medicine

## 2023-01-13 VITALS — BP 131/76 | HR 54 | Temp 97.8°F | Ht 67.0 in | Wt 160.0 lb

## 2023-01-13 DIAGNOSIS — I480 Paroxysmal atrial fibrillation: Secondary | ICD-10-CM

## 2023-01-13 DIAGNOSIS — N400 Enlarged prostate without lower urinary tract symptoms: Secondary | ICD-10-CM | POA: Diagnosis not present

## 2023-01-13 DIAGNOSIS — N390 Urinary tract infection, site not specified: Secondary | ICD-10-CM | POA: Diagnosis not present

## 2023-01-13 LAB — POCT URINALYSIS DIPSTICK
Bilirubin, UA: NEGATIVE
Blood, UA: POSITIVE
Glucose, UA: NEGATIVE
Ketones, UA: NEGATIVE
Leukocytes, UA: NEGATIVE
Nitrite, UA: NEGATIVE
Protein, UA: NEGATIVE
Spec Grav, UA: 1.015 (ref 1.010–1.025)
Urobilinogen, UA: 0.2 E.U./dL
pH, UA: 6 (ref 5.0–8.0)

## 2023-01-13 MED ORDER — NITROFURANTOIN MONOHYD MACRO 100 MG PO CAPS: 100.0000 mg | ORAL_CAPSULE | Freq: Two times a day (BID) | ORAL | 0 refills | Status: DC

## 2023-01-13 NOTE — Assessment & Plan Note (Signed)
[facility].  Anticoagulated on Eliquis.  Regular rate and rhythm today.

## 2023-01-13 NOTE — Assessment & Plan Note (Signed)
Follows with urology.  On Flomax 0.4 mg daily.  No overt signs of prostatitis today though may empirically treat for this depending on above workup.

## 2023-01-13 NOTE — Progress Notes (Signed)
   William Underwood is a 72 y.o. male who presents today for an office visit.  Assessment/Plan:  New/Acute Problems: Dysuria / Suprapubic Pain No red flags.  UA positive for blood but negative leukocytes and nitrites.  Likely has cystitis.  Will empirically start Macrobid 100 mg twice daily while we await culture results.  Prostatitis is also a consideration though he is not having any other classic findings for this.  Depending on his response to antibiotics and culture may consider extended course of Cipro or Bactrim to cover for this.  If culture is negative and symptoms persist will need to be referred back to urology especially in light of his hematuria.  Will need to have him come back in a couple of weeks after treatment to ensure resolution of hematuria.  Chronic Problems Addressed Today: Paroxysmal atrial fibrillation [facility]) Follows cardiology.  Anticoagulated on Eliquis.  Regular rate and rhythm today.  Benign prostatic hyperplasia without urinary obstruction Follows with urology.  On Flomax 0.4 mg daily.  No overt signs of prostatitis today though may empirically treat for this depending on above workup.     Subjective:  HPI:  See A/p for status of chronic conditions.  Main concern today is lower abdominal pain radiating into his penis. He is concerned about UTI.  This started yesterday. Located over his bladder. No fevers or chills. Some nausea. Some diarrhea. No hematuria. Worsening over the last day. Some soreness in his back. No urinary urgency or frequency. Has some burning with urination.  He has had issues with prostatitis in the past.  Symptoms are similar but not quite the same as previous flares.  Did ride his bike 65 miles 2 days ago.  Did well with this.  Pain is persistent.  No flank pain.       Objective:  Physical Exam: BP (!) 145/79   Pulse (!) 54   Temp 97.8 F (36.6 C) (Temporal)   Ht 5\' 7"  (1.702 m)   Wt 160 lb (72.6 kg)   SpO2 96%   BMI 25.06 kg/m    Gen: No acute distress, resting comfortably CV: Regular rate and rhythm with no murmurs appreciated Pulm: Normal work of breathing, clear to auscultation bilaterally with no crackles, wheezes, or rhonchi MSK: No CVA tenderness ABD: Soft, nondistended.  Tenderness palpation on suprapubic area. Neuro: Grossly normal, moves all extremities Psych: Normal affect and thought content      Mamie Diiorio M. [name], MD 01/13/2023 10:15 AM

## 2023-01-13 NOTE — Patient Instructions (Signed)
It was very nice to see you today!  You probably have a UTI.  Please start the [place].  We will check a urine culture.  We will contact you later this week once we get results back.  Please let us know if your symptoms are not improving.  Take care, Dr [name]  PLEASE NOTE:  If you had any lab tests, please let us know if you have not heard back within a few days. You may see your results on mychart before we have a chance to review them but we will give you a call once they are reviewed by [place].   If we ordered any referrals today, please let us know if you have not heard from their office within the next week.   If you had any urgent prescriptions sent in today, please check with the pharmacy within an hour of our visit to make sure the prescription was transmitted appropriately.   Please try these tips to maintain a healthy lifestyle:  Eat at least 3 REAL meals and 1-2 snacks per day.  Aim for no more than 5 hours between eating.  If you eat breakfast, please do so within one hour of getting up.   Each meal should contain half fruits/vegetables, one quarter protein, and one quarter carbs (no bigger than a computer mouse)  Cut down on sweet beverages. This includes juice, soda, and sweet tea.   Drink at least 1 glass of water with each meal and aim for at least 8 glasses per day  Exercise at least 150 minutes every week.

## 2023-01-14 LAB — URINE CULTURE
MICRO NUMBER:: 14711691
Result:: NO GROWTH
SPECIMEN QUALITY:: ADEQUATE

## 2023-01-15 ENCOUNTER — Emergency Department (HOSPITAL_BASED_OUTPATIENT_CLINIC_OR_DEPARTMENT_OTHER)
Admission: EM | Admit: 2023-01-15 | Discharge: 2023-01-15 | Disposition: A | Discharge status: Home / Self Care | Payer: PPO | Attending: Emergency Medicine | Admitting: Emergency Medicine

## 2023-01-15 ENCOUNTER — Other Ambulatory Visit: Payer: Self-pay

## 2023-01-15 ENCOUNTER — Encounter (HOSPITAL_BASED_OUTPATIENT_CLINIC_OR_DEPARTMENT_OTHER): Payer: Self-pay | Admitting: Emergency Medicine

## 2023-01-15 ENCOUNTER — Telehealth: Payer: Self-pay | Admitting: Family Medicine

## 2023-01-15 ENCOUNTER — Emergency Department (HOSPITAL_BASED_OUTPATIENT_CLINIC_OR_DEPARTMENT_OTHER): Payer: PPO

## 2023-01-15 DIAGNOSIS — N23 Unspecified renal colic: Secondary | ICD-10-CM | POA: Diagnosis not present

## 2023-01-15 DIAGNOSIS — I4891 Unspecified atrial fibrillation: Secondary | ICD-10-CM | POA: Diagnosis not present

## 2023-01-15 DIAGNOSIS — R109 Unspecified abdominal pain: Secondary | ICD-10-CM | POA: Diagnosis not present

## 2023-01-15 DIAGNOSIS — Z7901 Long term (current) use of anticoagulants: Secondary | ICD-10-CM | POA: Insufficient documentation

## 2023-01-15 DIAGNOSIS — R319 Hematuria, unspecified: Secondary | ICD-10-CM

## 2023-01-15 LAB — URINALYSIS, ROUTINE W REFLEX MICROSCOPIC
Bacteria, UA: NONE SEEN
Bilirubin Urine: NEGATIVE
Glucose, UA: NEGATIVE mg/dL
Ketones, ur: NEGATIVE mg/dL
Leukocytes,Ua: NEGATIVE
Nitrite: POSITIVE — AB
Protein, ur: NEGATIVE mg/dL
Specific Gravity, Urine: 1.011 (ref 1.005–1.030)
pH: 5 (ref 5.0–8.0)

## 2023-01-15 LAB — COMPREHENSIVE METABOLIC PANEL
ALT: 32 U/L (ref 0–44)
AST: 25 U/L (ref 15–41)
Albumin: 4.3 g/dL (ref 3.5–5.0)
Alkaline Phosphatase: 57 U/L (ref 38–126)
Anion gap: 4 — ABNORMAL LOW (ref 5–15)
BUN: 18 mg/dL (ref 8–23)
CO2: 33 mmol/L — ABNORMAL HIGH (ref 22–32)
Calcium: 10.1 mg/dL (ref 8.9–10.3)
Chloride: 104 mmol/L (ref 98–111)
Creatinine, Ser: 1.02 mg/dL (ref 0.61–1.24)
GFR, Estimated: >60 mL/min (ref >60)
Glucose, Bld: 101 mg/dL — ABNORMAL HIGH (ref 70–99)
Potassium: 4.3 mmol/L (ref 3.5–5.1)
Sodium: 141 mmol/L (ref 135–145)
Total Bilirubin: 0.4 mg/dL (ref 0.3–1.2)
Total Protein: 7.5 g/dL (ref 6.5–8.1)

## 2023-01-15 LAB — CBC
HCT: 46.2 % (ref 39.0–52.0)
Hemoglobin: 15.7 g/dL (ref 13.0–17.0)
MCH: 30.5 pg (ref 26.0–34.0)
MCHC: 34 g/dL (ref 30.0–36.0)
MCV: 89.9 fL (ref 80.0–100.0)
Platelets: 194 10*3/uL (ref 150–400)
RBC: 5.14 MIL/uL (ref 4.22–5.81)
RDW: 12.6 % (ref 11.5–15.5)
WBC: 9.5 10*3/uL (ref 4.0–10.5)
nRBC: 0 % (ref 0.0–0.2)

## 2023-01-15 LAB — LIPASE, BLOOD: Lipase: 25 U/L (ref 11–51)

## 2023-01-15 MED ORDER — OXYCODONE-ACETAMINOPHEN 5-325 MG PO TABS: 1.0000 | ORAL_TABLET | Freq: Four times a day (QID) | ORAL | 0 refills | Status: DC | PRN

## 2023-01-15 MED ORDER — IOHEXOL 300 MG/ML  SOLN
100.0000 mL | Freq: Once | INTRAMUSCULAR | Status: AC | PRN
  Administered 2023-01-15: 80 mL via INTRAVENOUS

## 2023-01-15 MED ORDER — KETOROLAC TROMETHAMINE 15 MG/ML IJ SOLN
15.0000 mg | Freq: Once | INTRAMUSCULAR | Status: AC
  Administered 2023-01-15: 15 mg via INTRAVENOUS
  Filled 2023-01-15: qty 1

## 2023-01-15 NOTE — ED Provider Notes (Signed)
[facility] Provider Note   CSN: [id number] Arrival date & time: 01/15/23  1736     History  Chief Complaint  Patient presents with   Abdominal Pain   Dysuria    William Underwood is a 72 y.o. male.  Patient with history of prostatitis, atrial fibrillation status post ablation on anticoagulation, history of pulmonary vein stenting --presents to the emergency department today for right-sided abdominal and flank pain.  Symptoms started 4 days ago as burning with urination increasing hesitancy.  Patient saw his primary care provider 2 days ago and was started on Macrobid.  Reports that urine testing was negative.  Today he developed more severe right flank and lower abdominal pain.  This is intermittent, worse with movement.  No associated nausea, vomiting, diarrhea.  He continues to have dysuria.  No previous abdominal surgeries.  Patient is very active on his bicycle.  He has been taking Azo.       Home Medications Prior to Admission medications   Medication Sig Start Date End Date Taking? Authorizing Provider  apixaban (ELIQUIS) 5 MG TABS tablet Take 1 tablet (5 mg total) by mouth 2 (two) times daily. 12/01/22   [name], MD  atorvastatin (LIPITOR) 80 MG tablet Take 1 tablet (80 mg total) by mouth daily. 09/08/22   [name], MD  Coenzyme Q10 100 MG capsule Take 100 mg by mouth daily.    [provider]  diazepam (VALIUM) 5 MG tablet Take 1 tablet (5 mg total) by mouth daily as needed for anxiety. Patient taking differently: Take 5 mg by mouth at bedtime as needed for anxiety. 09/02/21   [name], MD  Multiple Vitamins-Minerals (MULTIVITAMIN WITH MINERALS) tablet Take 1 tablet by mouth daily.     [provider]  nitrofurantoin, macrocrystal-monohydrate, (MACROBID) 100 MG capsule Take 1 capsule (100 mg total) by mouth 2 (two) times daily. 01/13/23   [name], MD  tamsulosin (FLOMAX) 0.4 MG CAPS  capsule TAKE 1 CAPSULE BY MOUTH EVERY DAY 12/01/22   [name], MD  traMADol (ULTRAM) 50 MG tablet Take 1 tablet (50 mg total) by mouth every 6 (six) hours as needed for moderate pain. 09/08/22   [name], MD      Allergies    Cefdinir and Codeine    Review of Systems   Review of Systems  Physical Exam Updated Vital Signs BP (!) 165/93 (BP Location: Right Arm)   Pulse (!) 54   Temp 98 F (36.7 C) (Oral)   Resp 18   SpO2 95%   Physical Exam Vitals and nursing note reviewed.  Constitutional:      General: He is not in acute distress.    Appearance: He is well-developed.  HENT:     Head: Normocephalic and atraumatic.  Eyes:     General:        Right eye: No discharge.        Left eye: No discharge.     Conjunctiva/sclera: Conjunctivae normal.  Cardiovascular:     Rate and Rhythm: Normal rate. Rhythm irregular.     Heart sounds: Normal heart sounds.     Comments: Bradycardia, suspect some extrasystoles, however will ensure no recurrent A-fib Pulmonary:     Effort: Pulmonary effort is normal.     Breath sounds: Normal breath sounds.  Abdominal:     Palpations: Abdomen is soft.     Tenderness: There is abdominal tenderness in  the right lower quadrant and suprapubic area. There is no guarding or rebound. Negative signs include Murphy's sign, Rovsing's sign and McBurney's sign.  Musculoskeletal:     Cervical back: Normal range of motion and neck supple.  Skin:    General: Skin is warm and dry.  Neurological:     Mental Status: He is alert.     ED Results / Procedures / Treatments   Labs (all labs ordered are listed, but only abnormal results are displayed) Labs Reviewed  COMPREHENSIVE METABOLIC PANEL - Abnormal; Notable for the following components:      Result Value   CO2 33 (*)    Glucose, Bld 101 (*)    Anion gap 4 (*)    All other components within normal limits  URINALYSIS, ROUTINE W REFLEX MICROSCOPIC - Abnormal; Notable for the following  components:   Color, Urine ORANGE (*)    Hgb urine dipstick SMALL (*)    Nitrite POSITIVE (*)    All other components within normal limits  LIPASE, BLOOD  CBC    EKG None  Radiology CT ABDOMEN PELVIS W CONTRAST  Result Date: 01/15/2023 CLINICAL DATA:  Abdominal pain.  Postop link pain. EXAM: CT ABDOMEN AND PELVIS WITH CONTRAST TECHNIQUE: Multidetector CT imaging of the abdomen and pelvis was performed using the standard protocol following bolus administration of intravenous contrast. RADIATION DOSE REDUCTION: This exam was performed according to the departmental dose-optimization program which includes automated exposure control, adjustment of the mA and/or kV according to patient size and/or use of iterative reconstruction technique. CONTRAST:  42mL OMNIPAQUE IOHEXOL 300 MG/ML  SOLN COMPARISON:  01/19/2021 FINDINGS: Lower chest: No acute abnormality Hepatobiliary: Small subcentimeter hypodensities in the liver stable most compatible with cysts. Gallbladder unremarkable. Pancreas: No focal abnormality or ductal dilatation. Spleen: No focal abnormality.  Normal size. Adrenals/Urinary Tract: 2 mm right UVJ stone with mild fullness of the right renal collecting system and ureter. No stones or hydronephrosis on the left. Adrenal glands and urinary bladder unremarkable. Stomach/Bowel: Normal appendix. Stomach, large and small bowel grossly unremarkable. Vascular/Lymphatic: Scattered aortic calcifications. No evidence of aneurysm or adenopathy. Reproductive: No visible focal abnormality. Other: No free fluid or free air. Musculoskeletal: No acute bony abnormality. IMPRESSION: 2 mm right UVJ stone with mild fullness of the right renal collecting system and ureter. Electronically Signed   By: Rolm Baptise M.D.   On: 01/15/2023 19:06    Procedures Procedures    Medications Ordered in ED Medications - No data to display  ED Course/ Medical Decision Making/ A&P    Patient seen and examined. History  obtained directly from patient.   Labs/EKG: Ordered CBC, CMP, UA.  Imaging: Ordered CT abdomen pelvis.  Medications/Fluids: Ordered: None ordered.   Most recent vital signs reviewed and are as follows: BP (!) 165/93 (BP Location: Right Arm)   Pulse (!) 54   Temp 98 F (36.7 C) (Oral)   Resp 18   SpO2 95%   Initial impression: Patient with UTI and possible prostatitis symptoms but recent negative UA, given worsening abdominal pain now involving the right flank and lower abdomen, will obtain CT imaging to ensure no intra-abdominal infection.  RN states that patient is requesting pain medication.  I have ordered a dose of 15 mg Toradol.  Patient without any active bleeding symptoms.    Reassessment performed. Patient appears comfortable.  Pain resolved after Toradol.  Labs personally reviewed and interpreted including: CBC unremarkable with normal hemoglobin; CMP unremarkable; lipase normal; UA with  positive nitrite but this is likely false positive due to Azo use, no compelling signs of infection otherwise.  Imaging personally visualized and interpreted including: CT of the abdomen pelvis, agree 2 mm right UPJ stone likely the cause of the patient's symptoms.  Reviewed other incidental findings with patient at bedside.  Reviewed pertinent lab work and imaging with patient at bedside. Questions answered.   Most current vital signs reviewed and are as follows: BP 110/80 (BP Location: Right Arm)   Pulse (!) 52   Temp 97.9 F (36.6 C) (Oral)   Resp 16   SpO2 96%   Plan: Discharge to home.   Prescriptions written for: Percocet # 5 tablets to use in case of recurrent severe pain.  Patient is already on Flomax.  Will avoid scheduled NSAID use due to use of anticoagulant.  Patient counseled on use of narcotic pain medications. Counseled not to combine these medications with others containing tylenol. Urged not to drink alcohol, drive, or perform any other activities that requires  focus while taking these medications. The patient verbalizes understanding and agrees with the plan.  Other home care instructions discussed: Maintain good hydration, strain urine  ED return instructions discussed: The patient was urged to return to the Emergency Department immediately with worsening of current symptoms, worsening abdominal pain, persistent vomiting, blood noted in stools, fever, or any other concerns. The patient verbalized understanding.   Follow-up instructions discussed: Patient encouraged to follow-up with urology in the next week.                               Medical Decision Making Amount and/or Complexity of Data Reviewed Labs: ordered. Radiology: ordered.  Risk Prescription drug management.   For this patient's complaint of abdominal pain, the following conditions were considered on the differential diagnosis: gastritis/PUD, enteritis/duodenitis, appendicitis, cholelithiasis/cholecystitis, cholangitis, pancreatitis, ruptured viscus, colitis, diverticulitis, small/large bowel obstruction, proctitis, cystitis, pyelonephritis, ureteral colic, aortic dissection, aortic aneurysm. Atypical chest etiologies were also considered including ACS, PE, and pneumonia.  The patient's vital signs, pertinent lab work and imaging were reviewed and interpreted as discussed in the ED course. Hospitalization was considered for further testing, treatments, or serial exams/observation. However as patient is well-appearing, has a stable exam, and reassuring studies today, I do not feel that they warrant admission at this time. This plan was discussed with the patient who verbalizes agreement and comfort with this plan and seems reliable and able to return to the Emergency Department with worsening or changing symptoms.           Final Clinical Impression(s) / ED Diagnoses Final diagnoses:  Ureteral colic    Rx / DC Orders ED Discharge Orders          Ordered     oxyCODONE-acetaminophen (PERCOCET/ROXICET) 5-325 MG tablet  Every 6 hours PRN        01/15/23 1928              Carlisle Cater, Hershal Coria 01/15/23 2325    Gareth Morgan, MD 01/16/23 [phone]

## 2023-01-15 NOTE — Telephone Encounter (Signed)
See message below for update.    Patient Name: William Underwood Gender: Male DOB: 02-Nov-1950 Age: 72 Y 2 M 21 D Return Phone Number: [phone] (Primary) Address: City/ State/ Zip: [place]  [address] [facility] Provider Dimas Chyle- MD Contact Type Call Who Is Calling Patient / Member / Family / Caregiver Call Type Triage / Clinical Relationship To Patient Self Return Phone Number [phone] (Primary) Chief Complaint Urine, Blood In Reason for Call Symptomatic / Request for [place] says he seen Tuesday for UTI Tuesday. Sx blood in urine, he says he is having pain when urinating with back pain Translation No Nurse Assessment Nurse: Patsey Berthold, RN, Lattie Haw Date/Time Eilene Ghazi Time): 01/15/2023 4:03:48 PM Confirm and document reason for call. If symptomatic, describe symptoms. ---Caller states he was put on antibiotic Dr though he had UTI, Urine lab was negative, Dr told him to call if worse sx, abdomen is hurting, Has pain in the back towards kidney. Caller states has blood in urine. caller states has pain in lower abdomen and burning, now radiating towards kidney, states it feels like stabbing. Does the patient have any new or worsening symptoms? ---Yes Will a triage be completed? ---Yes Related visit to physician within the last 2 weeks? ---Yes Does the PT have any chronic conditions? (i.e. diabetes, asthma, this includes High risk factors for pregnancy, etc.) ---No Is this a behavioral health or substance abuse call? ---No Guidelines Guideline Title Affirmed Question Affirmed Notes Nurse Date/Time (Eastern Time) Urine - Blood In [1] Pain or burning with passing urine AND [2] side (flank) or back pain present Rubie Maid 01/15/2023 4:07:25 PM PLEASE NOTE: All timestamps contained within this report are represented  as [place] Standard Time. CONFIDENTIALTY NOTICE: This fax transmission is intended only for the addressee. It contains information that is legally privileged, confidential or otherwise protected from use or disclosure. If you are not the intended recipient, you are strictly prohibited from reviewing, disclosing, copying using or disseminating any of this information or taking any action in reliance on or regarding this information. If you have received this fax in error, please notify us immediately by telephone so that we can arrange for its return to [place]. Phone: [phone], Toll-Free: [phone], Fax: [phone] Page: 2 of 2 Call Id: [id number] [place]. Time Eilene Ghazi Time) Disposition Final User 01/15/2023 4:09:51 PM See HCP within 4 Hours (or PCP triage) Yes Patsey Berthold, RN, Lattie Haw Final Disposition 01/15/2023 4:09:51 PM See HCP within 4 Hours (or PCP triage) Yes Patsey Berthold, RN, Leland Johns Disagree/Comply Comply Caller Understands Yes PreDisposition Go to ED Care Advice Given Per Guideline SEE HCP (OR PCP TRIAGE) WITHIN 4 HOURS: CALL BACK IF: * Fever occurs * You become worse CARE ADVICE given per Urine, Blood In (Adult) guideline. Comments User: Fredia Beets, RN Date/Time Eilene Ghazi Time): 01/15/2023 4:14:32 PM [place] patient access advocate stated there is no appt to be seen w/in outcome of 4 hrs, Caller to go to ED. User: Fredia Beets, RN Date/Time Eilene Ghazi Time): 01/15/2023 4:15:47 PM Caller going to [facility]. No available appts at office w/in 4 hr time frame. Referrals Warm transfer to backline

## 2023-01-15 NOTE — Discharge Instructions (Signed)
Please read and follow all provided instructions.  Your diagnoses today include:  1. Ureteral colic     Tests performed today include: Urine test that showed blood in your urine and no infection CT scan which showed a 2 millimeter kidney stone on the right side Blood test that showed normal kidney function Vital signs. See below for your results today.   Medications prescribed:  Oxycodone - narcotic pain medication  DO NOT drive or perform any activities that require you to be awake and alert because this medicine can make you drowsy.   NSAIDs can help but should be minimized due to the fact you are taking chronic anticoagulation because this can increase your bleeding risk.  Take any prescribed medications only as directed.  Home care instructions:  Follow any educational materials contained in this packet.  Please double your fluid intake for the next several days. Strain your urine and save any stones that may pass.   BE VERY CAREFUL not to take multiple medicines containing Tylenol (also called acetaminophen). Doing so can lead to an overdose which can damage your liver and cause liver failure and possibly death.   Follow-up instructions: Please follow-up with your urologist or the urologist referral (provided on front page) in the next 1 week for further evaluation of your symptoms.  Return instructions:  If you need to return to the Emergency Department, go to [facility] and not [facility]. The urologists are located at [facility] and can better care for you at this location.  Please return to the Emergency Department if you experience worsening symptoms.  Please return if you develop fever or uncontrolled pain or vomiting. Please return if you have any other emergent concerns.  Additional Information:  Your vital signs today were: BP (!) 136/92   Pulse (!) 51   Temp 98 F (36.7 C) (Oral)   Resp 15   SpO2 94%  If your blood pressure (BP) was  elevated above 135/85 this visit, please have this repeated by your doctor within one month. --------------

## 2023-01-15 NOTE — ED Triage Notes (Addendum)
Pt arrives to ED with c/o lower/suprapubic abdominal pain and dysuria that started on Monday. Saw GP on Tuesday and urine culture was negative, he was started on Macrobid. Pt notes that the pain has moved from lower abdomen to right flank today. He also notes that he is taking AZO.

## 2023-01-15 NOTE — Progress Notes (Signed)
Please inform patient of the following:  His urine culture is negative. It does not seem like he has a UTI but he can finish his course of macrobid.  I do not have a good explanation for why he had blood in his urine.  Please have him come back this week or next week to check a SEND OUT urinalysis to recheck for the blood in his urine.   He should let us know if his symptoms are not improving with the antibiotic.   [name]. [name], MD 01/15/2023 12:37 PM

## 2023-01-15 NOTE — ED Notes (Signed)
Discharge instructions, follow up care, and pain management with prescription reviewed and explained, pt verbalized understanding and had no further questions on d/c. Pt caox4, ambulatory, NAD on d/c.

## 2023-01-15 NOTE — Telephone Encounter (Signed)
Access Nurse states:  - Due to symptoms needs to be seen within 4 hours     Informed nurse that we do not have any open slots currently due to EOD. She verbalized understanding and agreed to tell pt to go to ED. Awaiting triage note.

## 2023-01-15 NOTE — Telephone Encounter (Signed)
Patient states; - UTI symptoms have worsened. - Pain when urinating; pain has traveled to lower right back near kidney   Patient has been transferred to triage.

## 2023-01-16 NOTE — Telephone Encounter (Signed)
I appreciate the update. I am able to see his CT scan and notes in epic.  His stone should pass spontaneously over the next several days. It is okay for him to double his dose of flomax.  This should help with stone passage.  He should be aware this may cause some dizziness and lightheadedness however.  He should let us know if symptoms are not improving.

## 2023-01-16 NOTE — Telephone Encounter (Signed)
Pt seen at ED, dx kidney stones

## 2023-01-19 DIAGNOSIS — N201 Calculus of ureter: Secondary | ICD-10-CM | POA: Diagnosis not present

## 2023-01-19 DIAGNOSIS — N411 Chronic prostatitis: Secondary | ICD-10-CM | POA: Diagnosis not present

## 2023-01-19 DIAGNOSIS — R351 Nocturia: Secondary | ICD-10-CM | POA: Diagnosis not present

## 2023-01-19 DIAGNOSIS — N401 Enlarged prostate with lower urinary tract symptoms: Secondary | ICD-10-CM | POA: Diagnosis not present

## 2023-01-26 DIAGNOSIS — H524 Presbyopia: Secondary | ICD-10-CM | POA: Diagnosis not present

## 2023-01-26 DIAGNOSIS — H2513 Age-related nuclear cataract, bilateral: Secondary | ICD-10-CM | POA: Diagnosis not present

## 2023-02-16 DIAGNOSIS — N3943 Post-void dribbling: Secondary | ICD-10-CM | POA: Diagnosis not present

## 2023-02-16 DIAGNOSIS — N411 Chronic prostatitis: Secondary | ICD-10-CM | POA: Diagnosis not present

## 2023-02-16 DIAGNOSIS — N201 Calculus of ureter: Secondary | ICD-10-CM | POA: Diagnosis not present

## 2023-02-16 DIAGNOSIS — N401 Enlarged prostate with lower urinary tract symptoms: Secondary | ICD-10-CM | POA: Diagnosis not present

## 2023-03-26 ENCOUNTER — Ambulatory Visit: Payer: PPO | Attending: Internal Medicine | Admitting: Internal Medicine

## 2023-03-26 VITALS — BP 138/80 | HR 46 | Ht 67.0 in | Wt 160.0 lb

## 2023-03-26 DIAGNOSIS — Z9889 Other specified postprocedural states: Secondary | ICD-10-CM

## 2023-03-26 DIAGNOSIS — R42 Dizziness and giddiness: Secondary | ICD-10-CM | POA: Diagnosis not present

## 2023-03-26 DIAGNOSIS — I700 Atherosclerosis of aorta: Secondary | ICD-10-CM | POA: Diagnosis not present

## 2023-03-26 DIAGNOSIS — I480 Paroxysmal atrial fibrillation: Secondary | ICD-10-CM | POA: Diagnosis not present

## 2023-03-26 DIAGNOSIS — E785 Hyperlipidemia, unspecified: Secondary | ICD-10-CM | POA: Diagnosis not present

## 2023-03-26 DIAGNOSIS — R29898 Other symptoms and signs involving the musculoskeletal system: Secondary | ICD-10-CM | POA: Diagnosis not present

## 2023-03-26 DIAGNOSIS — I639 Cerebral infarction, unspecified: Secondary | ICD-10-CM

## 2023-03-26 DIAGNOSIS — Z8679 Personal history of other diseases of the circulatory system: Secondary | ICD-10-CM | POA: Diagnosis not present

## 2023-03-26 DIAGNOSIS — D6869 Other thrombophilia: Secondary | ICD-10-CM

## 2023-03-26 DIAGNOSIS — I288 Other diseases of pulmonary vessels: Secondary | ICD-10-CM | POA: Diagnosis not present

## 2023-03-26 NOTE — Patient Instructions (Signed)
Medication Instructions:  No Changes In Medications at this time.  *If you need a refill on your cardiac medications before your next appointment, please call your pharmacy*  Follow-Up: At [facility], you and your health needs are our priority.  As part of our continuing mission to provide you with exceptional heart care, we have created designated Provider Care Teams.  These Care Teams include your primary Cardiologist (physician) and Advanced Practice Providers (APPs -  Physician Assistants and Nurse Practitioners) who all work together to provide you with the care you need, when you need it.  Your next appointment:   1 year(s)  Provider:   Gayatri A Acharya, MD    

## 2023-03-26 NOTE — Progress Notes (Signed)
Cardiology Office Note:    Date:  03/26/2023   ID:  WESNER ROZZELLE, DOB [birth year], MRN [id number]  PCP:  Ardith Dark, MD  Cardiologist:  Parke Poisson, MD  Electrophysiologist:  Hillis Range, MD (Inactive)   Referring MD: Ardith Dark, MD   Chief Complaint/Reason for Referral: Stroke, prior A. fib ablation, prior pulmonary vein stents for pulmonary vein stenosis  History of Present Illness:    William Underwood is a 72 y.o. male well-known to my clinic.  He previously followed with EP status post pulmonary vein isolation for atrial fibrillation, and followed at the [facility] for pulmonary vein stenosis following A. fib ablation.  He is undergone pulmonary vein stenting with restenosis of pulmonary vein stent, with no cardiopulmonary limitation on prior cardiopulmonary exercise testing.  Unfortunately in August 2021 he had an embolic appearing stroke.  He had continued on anticoagulation after pulmonary vein stenting for 1 year and instructed to stop by [facility] in April 2021.  He has not had a recurrence of atrial fibrillation that he is aware of and monitors his heart rhythm at home with wearable technology. At last visit, he had nearly recovered fully from prior CVA. He had two episodes which concern him for recurrent embolic events. He had orthostatic symptoms while rising from a seated position, with feeling extremely weak and both arms shaking. In addition he had an episode where he was biking and felt his left arm go numb. Unclear to him if this was related to a traumatic shoulder injury that occurred prior or if it was neurologic in origin. We performed a TEE for further evaluation of neurologic symptoms and previously assumed cardioembolic CVA, which was unremarkable for cause of stroke. 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   Feeling well overall.  Continued orthostasis and balance issues.  Likely vagal response and orthostatic venous pooling.  Recommend compression  socks.  Should also wear these with activity.  Patient also notes low oxygen saturations while sleeping at night.  Does not occur when supine and awake.  Suggest sleep disordered breathing.  It is infrequent and he is not experiencing symptoms, right ventricle appears normal on last year's TEE and no evidence of pulmonary hypertension yet.  Likely will repeat an echo next year.  Recommend sleep study he will call us when he is ready to schedule.  Patient wears a smart watch and there has been no recurrence of atrial fibrillation.  The patient denies chest pain, chest pressure, dyspnea at rest or with exertion, palpitations, PND, orthopnea, or leg swelling. Denies cough, fever, chills. Denies nausea, vomiting. Denies syncope or presyncope. Denies snoring.    Past Medical History:  Diagnosis Date   Benign prostatic hyperplasia without urinary obstruction 06/17/2014   BPPV (benign paroxysmal positional vertigo) 07/01/2011   Chronic hyperkalemia 05/20/2012   Overview:  Hx of for years   Concussion syndrome 05/15/2011   This seems like a moderately severe concussion with at least 1 minute loss of consciousness in 15-20 minutes of significant confusion including being unable to state his name    DEGENERATIVE JOINT DISEASE, HANDS 10/16/2009   Qualifier: Diagnosis of  By: Darrick Penna MD, [name]     Diverticulosis 06/20/2017   Hemorrhoids, external 06/20/2017   Lumbar spondylosis 06/20/2017   Fairly significant L4-L5 facet arthropathy with effusion.  Can consider facet injections and potentially RFA down the road.   NEUROPATHY 03/01/2008   Qualifier: Diagnosis of  By: Darrick Penna MD, [place]  Paroxysmal atrial fibrillation (HCC)    Pulmonary insufficiency 03/09/2018   moderate PI noted on echo 03/09/18   Pulmonary vein stenosis    left inferior pulmonary vein stenosis   Right inguinal hernia 06/17/2014   Sinus bradycardia    Strain of gluteus medius 01/29/2017   Stroke (HCC) 05/2020   Vertigo      Past Surgical History:  Procedure Laterality Date   ANKLE ARTHROSCOPY     ATRIAL FIBRILLATION ABLATION N/A 05/25/2018   Procedure: ATRIAL FIBRILLATION ABLATION;  Surgeon: Hillis Range, MD;  Location: [facility] INVASIVE CV LAB;  Service: Cardiovascular;  Laterality: N/A;   BUBBLE STUDY  03/25/2022   Procedure: BUBBLE STUDY;  Surgeon: Parke Poisson, MD;  Location: [facility] ENDOSCOPY;  Service: Cardiology;;   KNEE ARTHROSCOPY Left 11/2021   ROTATOR CUFF REPAIR Left 06/24/2021   TEE WITHOUT CARDIOVERSION N/A 03/25/2022   Procedure: TRANSESOPHAGEAL ECHOCARDIOGRAM (TEE);  Surgeon: Parke Poisson, MD;  Location: [facility] ENDOSCOPY;  Service: Cardiology;  Laterality: N/A;    Current Medications: Current Meds  Medication Sig   apixaban (ELIQUIS) 5 MG TABS tablet Take 1 tablet (5 mg total) by mouth 2 (two) times daily.   atorvastatin (LIPITOR) 80 MG tablet Take 1 tablet (80 mg total) by mouth daily.   Coenzyme Q10 100 MG capsule Take 100 mg by mouth daily.   diazepam (VALIUM) 5 MG tablet Take 1 tablet (5 mg total) by mouth daily as needed for anxiety. (Patient taking differently: Take 5 mg by mouth at bedtime as needed for anxiety.)   Multiple Vitamins-Minerals (MULTIVITAMIN WITH MINERALS) tablet Take 1 tablet by mouth daily.    tamsulosin (FLOMAX) 0.4 MG CAPS capsule TAKE 1 CAPSULE BY MOUTH EVERY DAY   traMADol (ULTRAM) 50 MG tablet Take 1 tablet (50 mg total) by mouth every 6 (six) hours as needed for moderate pain.     Allergies:   Cefdinir and Codeine   Social History   Tobacco Use   Smoking status: Never   Smokeless tobacco: Never  Vaping Use   Vaping Use: Never used  Substance Use Topics   Alcohol use: Yes    Comment: Occasionally   Drug use: No     Family History: The patient's family history includes Hearing loss in his father. There is no history of Colon cancer.  ROS:   Please see the history of present illness.    All other systems reviewed and are  negative.  EKGs/Labs/Other Studies Reviewed:    The following studies were reviewed today:  EKG: sinus bradycardia 46 bpm  Recent Labs: 09/08/2022: TSH 1.00 01/15/2023: ALT 32; BUN 18; Creatinine, Ser 1.02; Hemoglobin 15.7; Platelets 194; Potassium 4.3; Sodium 141  Recent Lipid Panel    Component Value Date/Time   CHOL 130 09/08/2022 1010   TRIG 58.0 09/08/2022 1010   HDL 56.60 09/08/2022 1010   CHOLHDL 2 09/08/2022 1010   VLDL 11.6 09/08/2022 1010   LDLCALC 61 09/08/2022 1010   LDLCALC 49 08/30/2020 1006    Physical Exam:    VS:  BP 138/80   Pulse (!) 46   Ht 5\' 7"  (1.702 m)   Wt 160 lb (72.6 kg)   SpO2 97%   BMI 25.06 kg/m     Wt Readings from Last 5 Encounters:  03/26/23 160 lb (72.6 kg)  01/13/23 160 lb (72.6 kg)  12/08/22 160 lb 6.4 oz (72.8 kg)  09/10/22 160 lb (72.6 kg)  09/08/22 162 lb (73.5 kg)    Constitutional: No acute  distress Eyes: sclera non-icteric, normal conjunctiva and lids ENMT: normal dentition, moist mucous membranes Cardiovascular: regular rhythm, bradycardic, no murmurs. S1 and S2 normal. No jugular venous distention.  Respiratory: clear to auscultation bilaterally GI : normal bowel sounds, soft and nontender. No distention.   MSK: extremities warm, well perfused. No edema.  NEURO:  moves all extremities. PSYCH: alert and oriented x 3, normal mood and affect.   ASSESSMENT:    1. Paroxysmal atrial fibrillation (HCC)   2. Secondary hypercoagulable state (HCC)   3. S/P ablation of atrial fibrillation   4. Pulmonary vein stenosis (HCC)   5. Cerebrovascular accident (CVA), unspecified mechanism (HCC)   6. Left arm weakness   7. Episodic lightheadedness   8. Aortic atherosclerosis (HCC)   9. Dyslipidemia, goal LDL below 70       PLAN:    CVA Lightheadedness Left arm weakness. -orthostatic vital signs in office unremarkable at prior visit. Recommend compression socks particularly while and after exercising. -He continues on  Eliquis and atorvastatin 80 mg daily.  Encouraged him to continue these given unclear nidus for stroke.   - TEE unremarkable for souce of stroke, personally performed.  - no worrisome interval symptoms.   Paroxysmal atrial fibrillation Secondary hypercoagulable state S/p PVI -No recurrence of AF, patient monitors on smart watch.  He should continue to monitor at home with wearable technology. - continue eliquis 5 mg BID. No bleeding events.  Pulmonary vein stenosis -Clinically stable, completed follow up at [facility] in 2022.  Supine hypoxia only occurs while sleeping, suggesting more likely sleep disordered breathing, he will call us when he would like to schedule a sleep study but feels things are stable at this time.  Dyslipidemia - lipids well treated on atorvastatin 80 mg daily, LDL 61. Continue.  Total time of encounter: 25 minutes total time of encounter, including 20 minutes spent in face-to-face patient care on the date of this encounter. This time includes coordination of care and counseling regarding above mentioned problem list. Remainder of non-face-to-face time involved reviewing chart documents/testing relevant to the patient encounter and documentation in the medical record. I have independently reviewed documentation from referring provider.   Weston Brass, MD, [facility]  [facility]      Medication Adjustments/Labs and Tests Ordered: Current medicines are reviewed at length with the patient today.  Concerns regarding medicines are outlined above.   Orders Placed This Encounter  Procedures   EKG 12-Lead    No orders of the defined types were placed in this encounter.   Patient Instructions  Medication Instructions:  No Changes In Medications at this time.   *If you need a refill on your cardiac medications before your next appointment, please call your pharmacy*  Follow-Up: At [facility], you and your health needs are our priority.  As  part of our continuing mission to provide you with exceptional heart care, we have created designated Provider Care Teams.  These Care Teams include your primary Cardiologist (physician) and Advanced Practice Providers (APPs -  Physician Assistants and Nurse Practitioners) who all work together to provide you with the care you need, when you need it.  Your next appointment:   1 year(s)  Provider:   Parke Poisson, MD

## 2023-04-02 DIAGNOSIS — R300 Dysuria: Secondary | ICD-10-CM | POA: Diagnosis not present

## 2023-04-02 DIAGNOSIS — M62838 Other muscle spasm: Secondary | ICD-10-CM | POA: Diagnosis not present

## 2023-04-02 DIAGNOSIS — R3982 Chronic bladder pain: Secondary | ICD-10-CM | POA: Diagnosis not present

## 2023-05-18 DIAGNOSIS — M79642 Pain in left hand: Secondary | ICD-10-CM | POA: Diagnosis not present

## 2023-05-18 DIAGNOSIS — M79641 Pain in right hand: Secondary | ICD-10-CM | POA: Diagnosis not present

## 2023-05-30 ENCOUNTER — Other Ambulatory Visit: Payer: Self-pay | Admitting: Internal Medicine

## 2023-05-30 DIAGNOSIS — I480 Paroxysmal atrial fibrillation: Secondary | ICD-10-CM

## 2023-06-03 ENCOUNTER — Encounter: Payer: Self-pay | Admitting: Internal Medicine

## 2023-06-03 DIAGNOSIS — I480 Paroxysmal atrial fibrillation: Secondary | ICD-10-CM

## 2023-06-04 MED ORDER — APIXABAN 5 MG PO TABS: 5.0000 mg | ORAL_TABLET | Freq: Two times a day (BID) | ORAL | 1 refills | Status: DC

## 2023-06-04 NOTE — Telephone Encounter (Signed)
Prescription refill request for Eliquis received. Indication: Afib  Last office visit: 03/26/23 Jacques Navy)  Scr: 1.02 (01/15/23)  Age: 72 Weight: 72.6kg  Appropriate dose. Refill sent.

## 2023-06-11 DIAGNOSIS — M7918 Myalgia, other site: Secondary | ICD-10-CM | POA: Diagnosis not present

## 2023-06-11 DIAGNOSIS — M542 Cervicalgia: Secondary | ICD-10-CM | POA: Diagnosis not present

## 2023-07-06 ENCOUNTER — Ambulatory Visit (INDEPENDENT_AMBULATORY_CARE_PROVIDER_SITE_OTHER): Payer: PPO

## 2023-07-06 VITALS — Wt 155.0 lb

## 2023-07-06 DIAGNOSIS — Z0000 Encounter for general adult medical examination without abnormal findings: Secondary | ICD-10-CM

## 2023-07-06 NOTE — Progress Notes (Signed)
Subjective:   William Underwood is a 72 y.o. male who presents for Medicare Annual/Subsequent preventive examination.  Visit Complete: Virtual  I connected with  William Underwood on 07/06/23 by a audio enabled telemedicine application and verified that I am speaking with the correct person using two identifiers.  Patient Location: Home  Provider Location: Office/Clinic  I discussed the limitations of evaluation and management by telemedicine. The patient expressed understanding and agreed to proceed.  Patient Medicare AWV questionnaire was completed by the patient on 06/30/23; I have confirmed that all information answered by patient is correct and no changes since this date.  Vital Signs: Unable to obtain new vitals due to this being a telehealth visit.   Review of Systems     Cardiac Risk Factors include: advanced age (>44men, >77 women);male gender;dyslipidemia     Objective:    Today's Vitals   07/06/23 1004  Weight: 155 lb (70.3 kg)   Body mass index is 24.28 kg/m.     07/06/2023   10:07 AM 01/15/2023    5:45 PM 09/16/2022    9:45 AM 03/25/2022   10:06 AM 09/03/2021    9:36 AM 03/18/2021    7:59 PM 03/12/2021    7:31 PM  Advanced Directives  Does Patient Have a Medical Advance Directive? Yes No Yes Yes Yes No No  Type of Estate agent of [place];Living will  Healthcare Power of [place];Living will Healthcare Power of [employer] Power of Attorney    Copy of Healthcare Power of Attorney in Chart? No - copy requested  No - copy requested No - copy requested No - copy requested    Would patient like information on creating a medical advance directive?      No - Patient declined     Current Medications (verified) Outpatient Encounter Medications as of 07/06/2023  Medication Sig   apixaban (ELIQUIS) 5 MG TABS tablet Take 1 tablet (5 mg total) by mouth 2 (two) times daily.   atorvastatin (LIPITOR) 80 MG tablet Take 1 tablet (80 mg total) by mouth  daily.   BOOSTRIX 5-2.5-18.5 LF-MCG/0.5 injection    Coenzyme Q10 100 MG capsule Take 100 mg by mouth daily.   diazepam (VALIUM) 5 MG tablet Take 1 tablet (5 mg total) by mouth daily as needed for anxiety. (Patient taking differently: Take 5 mg by mouth at bedtime as needed for anxiety.)   Multiple Vitamins-Minerals (MULTIVITAMIN WITH MINERALS) tablet Take 1 tablet by mouth daily.    tamsulosin (FLOMAX) 0.4 MG CAPS capsule TAKE 1 CAPSULE BY MOUTH EVERY DAY   traMADol (ULTRAM) 50 MG tablet Take 1 tablet (50 mg total) by mouth every 6 (six) hours as needed for moderate pain.   [DISCONTINUED] nitrofurantoin, macrocrystal-monohydrate, (MACROBID) 100 MG capsule Take 1 capsule (100 mg total) by mouth 2 (two) times daily. (Patient not taking: Reported on 03/26/2023)   [DISCONTINUED] oxyCODONE-acetaminophen (PERCOCET/ROXICET) 5-325 MG tablet Take 1 tablet by mouth every 6 (six) hours as needed for severe pain. (Patient not taking: Reported on 03/26/2023)   No facility-administered encounter medications on file as of 07/06/2023.    Allergies (verified) Cefdinir and Codeine   History: Past Medical History:  Diagnosis Date   Benign prostatic hyperplasia without urinary obstruction 06/17/2014   BPPV (benign paroxysmal positional vertigo) 07/01/2011   Chronic hyperkalemia 05/20/2012   Overview:  Hx of for years   Concussion syndrome 05/15/2011   This seems like a moderately severe concussion with at least 1 minute loss  of consciousness in 15-20 minutes of significant confusion including being unable to state his name    DEGENERATIVE JOINT DISEASE, HANDS 10/16/2009   Qualifier: Diagnosis of  By: Darrick Penna MD, [name]     Diverticulosis 06/20/2017   Hemorrhoids, external 06/20/2017   Lumbar spondylosis 06/20/2017   Fairly significant L4-L5 facet arthropathy with effusion.  Can consider facet injections and potentially RFA down the road.   NEUROPATHY 03/01/2008   Qualifier: Diagnosis of  By: Darrick Penna MD, [name]      Paroxysmal atrial fibrillation (HCC)    Pulmonary insufficiency 03/09/2018   moderate PI noted on echo 03/09/18   Pulmonary vein stenosis (HCC)    left inferior pulmonary vein stenosis   Right inguinal hernia 06/17/2014   Sinus bradycardia    Strain of gluteus medius 01/29/2017   Stroke (HCC) 05/2020   Vertigo    Past Surgical History:  Procedure Laterality Date   ANKLE ARTHROSCOPY     ATRIAL FIBRILLATION ABLATION N/A 05/25/2018   Procedure: ATRIAL FIBRILLATION ABLATION;  Surgeon: Hillis Range, MD;  Location: [facility] INVASIVE CV LAB;  Service: Cardiovascular;  Laterality: N/A;   BUBBLE STUDY  03/25/2022   Procedure: BUBBLE STUDY;  Surgeon: Parke Poisson, MD;  Location: [facility] ENDOSCOPY;  Service: Cardiology;;   KNEE ARTHROSCOPY Left 11/2021   ROTATOR CUFF REPAIR Left 06/24/2021   TEE WITHOUT CARDIOVERSION N/A 03/25/2022   Procedure: TRANSESOPHAGEAL ECHOCARDIOGRAM (TEE);  Surgeon: Parke Poisson, MD;  Location: [facility] ENDOSCOPY;  Service: Cardiology;  Laterality: N/A;   Family History  Problem Relation Age of Onset   Hearing loss Father    Colon cancer Neg Hx    Social History   Socioeconomic History   Marital status: Legally Separated    Spouse name: Not on file   Number of children: Not on file   Years of education: Not on file   Highest education level: Not on file  Occupational History   Occupation: retired  Tobacco Use   Smoking status: Never   Smokeless tobacco: Never  Vaping Use   Vaping status: Never Used  Substance and Sexual Activity   Alcohol use: Yes    Comment: Occasionally   Drug use: No   Sexual activity: Not Currently  Other Topics Concern   Not on file  Social History Narrative   Lives alone   Right Handed   Drinks 3-4 cups caffeine daily   Social Determinants of Health   Financial Resource Strain: Low Risk  (06/30/2023)   Overall Financial Resource Strain (CARDIA)    Difficulty of Paying Living Expenses: Not hard at all  Food Insecurity: No Food  Insecurity (06/30/2023)   Hunger Vital Sign    Worried About Running Out of Food in the Last Year: Never true    Ran Out of Food in the Last Year: Never true  Transportation Needs: No Transportation Needs (06/30/2023)   PRAPARE - Administrator, Civil Service (Medical): No    Lack of Transportation (Non-Medical): No  Physical Activity: Sufficiently Active (06/30/2023)   Exercise Vital Sign    Days of Exercise per Week: 6 days    Minutes of Exercise per Session: 90 min  Stress: No Stress Concern Present (06/30/2023)   [employer] of Occupational Health - Occupational Stress Questionnaire    Feeling of Stress : Not at all  Social Connections: Moderately Integrated (06/30/2023)   Social Connection and Isolation Panel [NHANES]    Frequency of Communication with Friends and Family: More than three times  a week    Frequency of Social Gatherings with Friends and Family: More than three times a week    Attends Religious Services: 1 to 4 times per year    Active Member of [employer] or Organizations: Yes    Attends Engineer, structural: More than 4 times per year    Marital Status: Separated    Tobacco Counseling Counseling given: Not Answered   Clinical Intake:  Pre-visit preparation completed: Yes  Pain : No/denies pain     BMI - recorded: 24.28 Nutritional Status: BMI of 19-24  Normal Nutritional Risks: None Diabetes: No  How often do you need to have someone help you when you read instructions, pamphlets, or other written materials from your doctor or pharmacy?: 1 - Never  Interpreter Needed?: No  Information entered by :: Lanier Ensign, LPN   Activities of Daily Living    06/30/2023    3:50 PM 09/16/2022    9:46 AM  In your present state of health, do you have any difficulty performing the following activities:  Hearing? 0 0  Vision? 0 0  Difficulty concentrating or making decisions? 0 0  Walking or climbing stairs? 0 0  Dressing or bathing? 0 0   Doing errands, shopping? 0 0  Preparing Food and eating ? N N  Using the Toilet? N N  In the past six months, have you accidently leaked urine? N N  Do you have problems with loss of bowel control? N N  Managing your Medications? N N  Managing your Finances? N N  Housekeeping or managing your Housekeeping? N N    Patient Care Team: Ardith Dark, MD as PCP - General (Family Medicine) Parke Poisson, MD as PCP - Cardiology (Cardiology) Hillis Range, MD (Inactive) as PCP - Electrophysiology (Cardiology) Hillis Range, MD (Inactive) as Consulting Physician (Cardiology) Micki Riley, MD as Consulting Physician (Neurology)  Indicate any recent Medical Services you may have received from other than [facility] providers in the past year (date may be approximate).     Assessment:   This is a routine wellness examination for [place].  Hearing/Vision screen Hearing Screening - Comments:: Pt denies any hearing issues  Vision Screening - Comments:: Pt follows up with Dr Cathey Endow for annual eye exams    Goals Addressed             This Visit's Progress    Patient Stated       Stay healthy and active        Depression Screen    07/06/2023   10:08 AM 01/13/2023    9:27 AM 12/08/2022   10:57 AM 11/04/2022   11:46 AM 09/16/2022    9:43 AM 09/08/2022    9:18 AM 09/03/2021    9:36 AM  PHQ 2/9 Scores  PHQ - 2 Score 0 0 0 0 0 0 0    Fall Risk    06/30/2023    3:50 PM 01/13/2023    9:27 AM 12/08/2022   10:57 AM 09/16/2022    9:45 AM 09/08/2022    9:18 AM  Fall Risk   Falls in the past year? 0 0 0 0 0  Number falls in past yr: 0  0 0 0  Injury with Fall? 0 0 0 0 0  Risk for fall due to : Impaired vision No Fall Risks No Fall Risks Impaired vision No Fall Risks  Follow up Falls prevention discussed   Falls prevention discussed  MEDICARE RISK AT HOME: Medicare Risk at Home Any stairs in or around the home?: Yes If so, are there any without handrails?: No Home free of loose  throw rugs in walkways, pet beds, electrical cords, etc?: Yes Adequate lighting in your home to reduce risk of falls?: Yes Life alert?: No Use of a cane, walker or w/c?: No Grab bars in the bathroom?: No Shower chair or bench in shower?: No Elevated toilet seat or a handicapped toilet?: No  TIMED UP AND GO:  Was the test performed?  No    Cognitive Function:        07/06/2023   10:10 AM 09/16/2022    9:46 AM 09/03/2021    9:39 AM  6CIT Screen  What Year? 0 points 0 points 0 points  What month? 0 points 0 points 0 points  What time? 0 points 0 points 0 points  Count back from 20 0 points 0 points 0 points  Months in reverse 0 points 0 points 0 points  Repeat phrase 0 points 0 points 0 points  Total Score 0 points 0 points 0 points    Immunizations Immunization History  Administered Date(s) Administered   Fluad Quad(high Dose 65+) 07/09/2019, 08/09/2021   Influenza Split 07/27/2018   Influenza Whole 07/23/2018   Influenza, High Dose Seasonal PF 07/13/2017, 07/23/2018, 08/05/2022   Influenza, Seasonal, Injecte, Preservative Fre 08/01/2014   Influenza,inj,Quad PF,6+ Mos 08/13/2015   Influenza-Unspecified 08/09/2021   PFIZER Comirnaty(Gray Top)Covid-19 Tri-Sucrose Vaccine 08/05/2022   PFIZER(Purple Top)SARS-COV-2 Vaccination 11/19/2019, 12/10/2019, 08/07/2020   Pfizer Covid-19 Vaccine Bivalent Booster 57yrs & up 08/12/2021   Pneumococcal Conjugate-13 08/23/2018   Pneumococcal Polysaccharide-23 07/13/2016, 07/27/2018   Pneumococcal-Unspecified 10/27/2011   Respiratory Syncytial Virus Vaccine,Recomb Aduvanted(Arexvy) 08/05/2022   Tdap 01/16/2023   Zoster Recombinant(Shingrix) 08/26/2019, 11/07/2019   Zoster, Live 10/27/2011    TDAP status: Up to date  Flu Vaccine status: Due, Education has been provided regarding the importance of this vaccine. Advised may receive this vaccine at local pharmacy or Health Dept. Aware to provide a copy of the vaccination record if obtained  from local pharmacy or Health Dept. Verbalized acceptance and understanding.  Pneumococcal vaccine status: Up to date  Covid-19 vaccine status: Information provided on how to obtain vaccines.   Qualifies for Shingles Vaccine? Yes   Zostavax completed Yes   Shingrix Completed?: Yes  Screening Tests Health Maintenance  Topic Date Due   INFLUENZA VACCINE  05/28/2023   COVID-19 Vaccine (6 - 2023-24 season) 06/28/2023   Medicare Annual Wellness (AWV)  07/05/2024   Colonoscopy  03/12/2026   DTaP/Tdap/Td (2 - Td or Tdap) 01/15/2033   Pneumonia Vaccine 33+ Years old  Completed   Hepatitis C Screening  Completed   Zoster Vaccines- Shingrix  Completed   HPV VACCINES  Aged Out    Health Maintenance  Health Maintenance Due  Topic Date Due   INFLUENZA VACCINE  05/28/2023   COVID-19 Vaccine (6 - 2023-24 season) 06/28/2023    Colorectal cancer screening: Type of screening: Colonoscopy. Completed 03/12/16. Repeat every 10 years  Lung Cancer Screening: (Low Dose CT Chest recommended if Age 55-80 years, 20 pack-year currently smoking OR have quit w/in 15years.) does not qualify.     Additional Screening:  Hepatitis C Screening: Completed 08/23/18  Vision Screening: Recommended annual ophthalmology exams for early detection of glaucoma and other disorders of the eye. Is the patient up to date with their annual eye exam?  Yes  Who is the provider or what is  the name of the office in which the patient attends annual eye exams? Dr Cathey Endow  If pt is not established with a provider, would they like to be referred to a provider to establish care? No .   Dental Screening: Recommended annual dental exams for proper oral hygiene   Community Resource Referral / Chronic Care Management: CRR required this visit?  No   CCM required this visit?  No     Plan:     I have personally reviewed and noted the following in the patient's chart:   Medical and social history Use of alcohol, tobacco  or illicit drugs  Current medications and supplements including opioid prescriptions. Patient is currently taking opioid prescriptions. Information provided to patient regarding non-opioid alternatives. Patient advised to discuss non-opioid treatment plan with their provider. Functional ability and status Nutritional status Physical activity Advanced directives List of other physicians Hospitalizations, surgeries, and ER visits in previous 12 months Vitals Screenings to include cognitive, depression, and falls Referrals and appointments  In addition, I have reviewed and discussed with patient certain preventive protocols, quality metrics, and best practice recommendations. A written personalized care plan for preventive services as well as general preventive health recommendations were provided to patient.     Marzella Schlein, LPN   11/04/1476   After Visit Summary: (MyChart) Due to this being a telephonic visit, the after visit summary with patients personalized plan was offered to patient via MyChart   Nurse Notes: none

## 2023-07-06 NOTE — Progress Notes (Signed)
I have personally reviewed the Medicare Annual Wellness Visit and agree with the documentation.  Katina Degree. Jimmey Ralph, MD 07/06/2023 10:21 AM

## 2023-07-06 NOTE — Patient Instructions (Signed)
William Underwood , Thank you for taking time to come for your Medicare Wellness Visit. I appreciate your ongoing commitment to your health goals. Please review the following plan we discussed and let me know if I can assist you in the future.   Referrals/Orders/Follow-Ups/Clinician Recommendations: stay healthy and active   This is a list of the screening recommended for you and due dates:  Health Maintenance  Topic Date Due   Flu Shot  05/28/2023   COVID-19 Vaccine (6 - 2023-24 season) 06/28/2023   Medicare Annual Wellness Visit  07/05/2024   Colon Cancer Screening  03/12/2026   DTaP/Tdap/Td vaccine (2 - Td or Tdap) 01/15/2033   Pneumonia Vaccine  Completed   Hepatitis C Screening  Completed   Zoster (Shingles) Vaccine  Completed   HPV Vaccine  Aged Out    Advanced directives: (Copy Requested) Please bring a copy of your health care power of attorney and living will to the office to be added to your chart at your convenience.  Next Medicare Annual Wellness Visit scheduled for next year: Yes

## 2023-07-09 DIAGNOSIS — M4722 Other spondylosis with radiculopathy, cervical region: Secondary | ICD-10-CM | POA: Diagnosis not present

## 2023-07-09 DIAGNOSIS — M542 Cervicalgia: Secondary | ICD-10-CM | POA: Diagnosis not present

## 2023-08-30 ENCOUNTER — Other Ambulatory Visit: Payer: Self-pay | Admitting: Family Medicine

## 2023-08-30 DIAGNOSIS — E785 Hyperlipidemia, unspecified: Secondary | ICD-10-CM

## 2023-09-07 DIAGNOSIS — D225 Melanocytic nevi of trunk: Secondary | ICD-10-CM | POA: Diagnosis not present

## 2023-09-07 DIAGNOSIS — L821 Other seborrheic keratosis: Secondary | ICD-10-CM | POA: Diagnosis not present

## 2023-09-07 DIAGNOSIS — L820 Inflamed seborrheic keratosis: Secondary | ICD-10-CM | POA: Diagnosis not present

## 2023-09-07 DIAGNOSIS — D1801 Hemangioma of skin and subcutaneous tissue: Secondary | ICD-10-CM | POA: Diagnosis not present

## 2023-09-10 ENCOUNTER — Encounter: Payer: Self-pay | Admitting: Family Medicine

## 2023-09-10 ENCOUNTER — Ambulatory Visit (INDEPENDENT_AMBULATORY_CARE_PROVIDER_SITE_OTHER): Payer: PPO | Admitting: Family Medicine

## 2023-09-10 VITALS — BP 119/69 | HR 54 | Temp 97.3°F | Ht 67.0 in | Wt 158.4 lb

## 2023-09-10 DIAGNOSIS — R739 Hyperglycemia, unspecified: Secondary | ICD-10-CM

## 2023-09-10 DIAGNOSIS — G4700 Insomnia, unspecified: Secondary | ICD-10-CM | POA: Diagnosis not present

## 2023-09-10 DIAGNOSIS — R3911 Hesitancy of micturition: Secondary | ICD-10-CM | POA: Diagnosis not present

## 2023-09-10 DIAGNOSIS — I480 Paroxysmal atrial fibrillation: Secondary | ICD-10-CM

## 2023-09-10 DIAGNOSIS — E785 Hyperlipidemia, unspecified: Secondary | ICD-10-CM | POA: Diagnosis not present

## 2023-09-10 DIAGNOSIS — H9319 Tinnitus, unspecified ear: Secondary | ICD-10-CM

## 2023-09-10 DIAGNOSIS — Z0000 Encounter for general adult medical examination without abnormal findings: Secondary | ICD-10-CM | POA: Diagnosis not present

## 2023-09-10 DIAGNOSIS — F411 Generalized anxiety disorder: Secondary | ICD-10-CM | POA: Diagnosis not present

## 2023-09-10 DIAGNOSIS — M1990 Unspecified osteoarthritis, unspecified site: Secondary | ICD-10-CM | POA: Diagnosis not present

## 2023-09-10 DIAGNOSIS — N200 Calculus of kidney: Secondary | ICD-10-CM | POA: Insufficient documentation

## 2023-09-10 DIAGNOSIS — Z0001 Encounter for general adult medical examination with abnormal findings: Secondary | ICD-10-CM

## 2023-09-10 DIAGNOSIS — G4730 Sleep apnea, unspecified: Secondary | ICD-10-CM | POA: Diagnosis not present

## 2023-09-10 LAB — COMPREHENSIVE METABOLIC PANEL
ALT: 25 U/L (ref 0–53)
AST: 24 U/L (ref 0–37)
Albumin: 4.1 g/dL (ref 3.5–5.2)
Alkaline Phosphatase: 53 U/L (ref 39–117)
BUN: 16 mg/dL (ref 6–23)
CO2: 29 meq/L (ref 19–32)
Calcium: 9.4 mg/dL (ref 8.4–10.5)
Chloride: 107 meq/L (ref 96–112)
Creatinine, Ser: 0.76 mg/dL (ref 0.40–1.50)
GFR: 89.56 mL/min (ref >60.00)
Glucose, Bld: 101 mg/dL — ABNORMAL HIGH (ref 70–99)
Potassium: 4.9 meq/L (ref 3.5–5.1)
Sodium: 141 meq/L (ref 135–145)
Total Bilirubin: 0.6 mg/dL (ref 0.2–1.2)
Total Protein: 6.3 g/dL (ref 6.0–8.3)

## 2023-09-10 LAB — CBC
HCT: 45.9 % (ref 39.0–52.0)
Hemoglobin: 15.1 g/dL (ref 13.0–17.0)
MCHC: 32.8 g/dL (ref 30.0–36.0)
MCV: 92.9 fL (ref 78.0–100.0)
Platelets: 206 10*3/uL (ref 150.0–400.0)
RBC: 4.94 Mil/uL (ref 4.22–5.81)
RDW: 13.5 % (ref 11.5–15.5)
WBC: 7.3 10*3/uL (ref 4.0–10.5)

## 2023-09-10 LAB — LIPID PANEL
Cholesterol: 127 mg/dL (ref 0–200)
HDL: 51.7 mg/dL (ref >39.00)
LDL Cholesterol: 66 mg/dL (ref 0–99)
NonHDL: 75.36
Total CHOL/HDL Ratio: 2
Triglycerides: 49 mg/dL (ref 0.0–149.0)
VLDL: 9.8 mg/dL (ref 0.0–40.0)

## 2023-09-10 LAB — HEMOGLOBIN A1C: Hgb A1c MFr Bld: 5.8 % (ref 4.6–6.5)

## 2023-09-10 LAB — TSH: TSH: 1.28 u[IU]/mL (ref 0.35–5.50)

## 2023-09-10 LAB — PSA: PSA: 1.67 ng/mL (ref 0.10–4.00)

## 2023-09-10 MED ORDER — DIAZEPAM 5 MG PO TABS: 5.0000 mg | ORAL_TABLET | Freq: Every day | ORAL | 1 refills | Status: AC | PRN

## 2023-09-10 MED ORDER — TRAMADOL HCL 50 MG PO TABS: 50.0000 mg | ORAL_TABLET | Freq: Four times a day (QID) | ORAL | 5 refills | Status: DC | PRN

## 2023-09-10 NOTE — Assessment & Plan Note (Signed)
Overall stable on valium as needed.  Uses this very sporadically as needed.  Database without red flags.  Will refill today.

## 2023-09-10 NOTE — Assessment & Plan Note (Signed)
No recurrences since earlier this year.  He is on Flomax daily and tolerating well.  Continue management per urology.

## 2023-09-10 NOTE — Assessment & Plan Note (Signed)
Following with urology.  Symptoms are thought to be more related to pelvic floor dysfunction and BPH.  Symptoms are improving with home exercises and Flomax 0.4 mg daily.  We are checking PSA today.

## 2023-09-10 NOTE — Assessment & Plan Note (Signed)
Stable on tramadol as needed.  Database without red flags.  Medications help with pain and activities of daily living.  He is also using topical Voltaren gel which does seem to help as well.

## 2023-09-10 NOTE — Assessment & Plan Note (Signed)
Regular rate and rhythm.  Follows with cardiology.  He is anticoagulated on Eliquis.

## 2023-09-10 NOTE — Patient Instructions (Addendum)
It was very nice to see you today!  We will check blood work.  Please continue to work on diet and exercise.  You can try taking magnesium at night.  Return in about 1 year (around 09/09/2024) for Annual Physical.   Take care, Dr Jimmey Ralph  PLEASE NOTE:  If you had any lab tests, please let us know if you have not heard back within a few days. You may see your results on mychart before we have a chance to review them but we will give you a call once they are reviewed by [place].   If we ordered any referrals today, please let us know if you have not heard from their office within the next week.   If you had any urgent prescriptions sent in today, please check with the pharmacy within an hour of our visit to make sure the prescription was transmitted appropriately.   Please try these tips to maintain a healthy lifestyle:  Eat at least 3 REAL meals and 1-2 snacks per day.  Aim for no more than 5 hours between eating.  If you eat breakfast, please do so within one hour of getting up.   Each meal should contain half fruits/vegetables, one quarter protein, and one quarter carbs (no bigger than a computer mouse)  Cut down on sweet beverages. This includes juice, soda, and sweet tea.   Drink at least 1 glass of water with each meal and aim for at least 8 glasses per day  Exercise at least 150 minutes every week.    Preventive Care 35 Years and Older, Male Preventive care refers to lifestyle choices and visits with your health care provider that can promote health and wellness. Preventive care visits are also called wellness exams. What can I expect for my preventive care visit? Counseling During your preventive care visit, your health care provider may ask about your: Medical history, including: Past medical problems. Family medical history. History of falls. Current health, including: Emotional well-being. Home life and relationship well-being. Sexual activity. Memory and ability to  understand (cognition). Lifestyle, including: Alcohol, nicotine or tobacco, and drug use. Access to firearms. Diet, exercise, and sleep habits. Work and work Astronomer. Sunscreen use. Safety issues such as seatbelt and bike helmet use. Physical exam Your health care provider will check your: Height and weight. These may be used to calculate your BMI (body mass index). BMI is a measurement that tells if you are at a healthy weight. Waist circumference. This measures the distance around your waistline. This measurement also tells if you are at a healthy weight and may help predict your risk of certain diseases, such as type 2 diabetes and high blood pressure. Heart rate and blood pressure. Body temperature. Skin for abnormal spots. What immunizations do I need?  Vaccines are usually given at various ages, according to a schedule. Your health care provider will recommend vaccines for you based on your age, medical history, and lifestyle or other factors, such as travel or where you work. What tests do I need? Screening Your health care provider may recommend screening tests for certain conditions. This may include: Lipid and cholesterol levels. Diabetes screening. This is done by checking your blood sugar (glucose) after you have not eaten for a while (fasting). Hepatitis C test. Hepatitis B test. HIV (human immunodeficiency virus) test. STI (sexually transmitted infection) testing, if you are at risk. Lung cancer screening. Colorectal cancer screening. Prostate cancer screening. Abdominal aortic aneurysm (AAA) screening. You may need this if  you are a current or former smoker. Talk with your health care provider about your test results, treatment options, and if necessary, the need for more tests. Follow these instructions at home: Eating and drinking  Eat a diet that includes fresh fruits and vegetables, whole grains, lean protein, and low-fat dairy products. Limit your intake of  foods with high amounts of sugar, saturated fats, and salt. Take vitamin and mineral supplements as recommended by your health care provider. Do not drink alcohol if your health care provider tells you not to drink. If you drink alcohol: Limit how much you have to 0-2 drinks a day. Know how much alcohol is in your drink. In the [place] one drink equals one 12 oz bottle of beer (355 mL), one 5 oz glass of wine (148 mL), or one 1 oz glass of hard liquor (44 mL). Lifestyle Brush your teeth every morning and night with fluoride toothpaste. Floss one time each day. Exercise for at least 30 minutes 5 or more days each week. Do not use any products that contain nicotine or tobacco. These products include cigarettes, chewing tobacco, and vaping devices, such as e-cigarettes. If you need help quitting, ask your health care provider. Do not use drugs. If you are sexually active, practice safe sex. Use a condom or other form of protection to prevent STIs. Take aspirin only as told by your health care provider. Make sure that you understand how much to take and what form to take. Work with your health care provider to find out whether it is safe and beneficial for you to take aspirin daily. Ask your health care provider if you need to take a cholesterol-lowering medicine (statin). Find healthy ways to manage stress, such as: Meditation, yoga, or listening to music. Journaling. Talking to a trusted person. Spending time with friends and family. Safety Always wear your seat belt while driving or riding in a vehicle. Do not drive: If you have been drinking alcohol. Do not ride with someone who has been drinking. When you are tired or distracted. While texting. If you have been using any mind-altering substances or drugs. Wear a helmet and other protective equipment during sports activities. If you have firearms in your house, make sure you follow all gun safety procedures. Minimize exposure to UV  radiation to reduce your risk of skin cancer. What's next? Visit your health care provider once a year for an annual wellness visit. Ask your health care provider how often you should have your eyes and teeth checked. Stay up to date on all vaccines. This information is not intended to replace advice given to you by your health care provider. Make sure you discuss any questions you have with your health care provider. Document Revised: 04/10/2021 Document Reviewed: 04/10/2021 Elsevier Patient Education  2024 [employer].

## 2023-09-10 NOTE — Assessment & Plan Note (Signed)
On Lipitor 80 mg daily.  Doing well with this.  He is doing a great job with diet and exercise.  We are checking labs today.

## 2023-09-10 NOTE — Assessment & Plan Note (Signed)
Symptoms are improving.  We did refer him for sleep study earlier this year however he never heard back from referral.  We discussed placement referral again today however he declined.

## 2023-09-10 NOTE — Assessment & Plan Note (Signed)
Has tried several over-the-counter treatments for this including melatonin and Unisom without much improvement.  He is working on sleep hygiene measures.  Would be reasonable for him to try nighttime magnesium.  He will let us know if not improving.  We could consider trial of hydroxyzine or trazodone in the future if this continues to be an issue.

## 2023-09-10 NOTE — Progress Notes (Signed)
Chief Complaint:  William Underwood is a 72 y.o. male who presents today for his annual comprehensive physical exam.    Assessment/Plan:  Chronic Problems Addressed Today: Urinary hesitancy Following with urology.  Symptoms are thought to be more related to pelvic floor dysfunction and BPH.  Symptoms are improving with home exercises and Flomax 0.4 mg daily.  We are checking PSA today.  Paroxysmal atrial fibrillation (HCC) Regular rate and rhythm.  Follows with cardiology.  He is anticoagulated on Eliquis.  Dyslipidemia On Lipitor 80 mg daily.  Doing well with this.  He is doing a great job with diet and exercise.  We are checking labs today.  Sleep-disordered breathing Symptoms are improving.  We did refer him for sleep study earlier this year however he never heard back from referral.  We discussed placement referral again today however he declined.  Insomnia Has tried several over-the-counter treatments for this including melatonin and Unisom without much improvement.  He is working on sleep hygiene measures.  Would be reasonable for him to try nighttime magnesium.  He will let us know if not improving.  We could consider trial of hydroxyzine or trazodone in the future if this continues to be an issue.  Nephrolithiasis No recurrences since earlier this year.  He is on Flomax daily and tolerating well.  Continue management per urology.  Osteoarthritis Stable on tramadol as needed.  Database without red flags.  Medications help with pain and activities of daily living.  He is also using topical Voltaren gel which does seem to help as well.  Tinnitus Overall stable on valium as needed.  Uses this very sporadically as needed.  Database without red flags.  Will refill today.  Preventative Healthcare: Check labs.  Up-to-date on cancer screening.  Up-to-date on vaccines.  Patient Counseling(The following topics were reviewed and/or handout was given):  -Nutrition: Stressed importance  of moderation in sodium/caffeine intake, saturated fat and cholesterol, caloric balance, sufficient intake of fresh fruits, vegetables, and fiber.  -Stressed the importance of regular exercise.   -Substance Abuse: Discussed cessation/primary prevention of tobacco, alcohol, or other drug use; driving or other dangerous activities under the influence; availability of treatment for abuse.   -Injury prevention: Discussed safety belts, safety helmets, smoke detector, smoking near bedding or upholstery.   -Sexuality: Discussed sexually transmitted diseases, partner selection, use of condoms, avoidance of unintended pregnancy and contraceptive alternatives.   -Dental health: Discussed importance of regular tooth brushing, flossing, and dental visits.  -Health maintenance and immunizations reviewed. Please refer to Health maintenance section.  Return to care in 1 year for next preventative visit.     Subjective:  HPI:  He has no acute complaints today.  Patient here today for annual physical.  Last seen here about 8 or 9 months ago.  Since our last visit he has been dealing with kidney stones.  Been following with urology for this and stone has passed.   Lifestyle Diet: Balanced. Plenty of fruits and vegetables.  Exercise: He is an avid cycler.      09/10/2023    8:45 AM  Depression screen PHQ 2/9  Decreased Interest 0  Down, Depressed, Hopeless 0  PHQ - 2 Score 0    There are no preventive care reminders to display for this patient.   ROS: Per HPI, otherwise a complete review of systems was negative.   PMH:  The following were reviewed and entered/updated in epic: Past Medical History:  Diagnosis Date   Benign prostatic  hyperplasia without urinary obstruction 06/17/2014   BPPV (benign paroxysmal positional vertigo) 07/01/2011   Chronic hyperkalemia 05/20/2012   Overview:  Hx of for years   Concussion syndrome 05/15/2011   This seems like a moderately severe concussion with at  least 1 minute loss of consciousness in 15-20 minutes of significant confusion including being unable to state his name    DEGENERATIVE JOINT DISEASE, HANDS 10/16/2009   Qualifier: Diagnosis of  By: Darrick Penna MD, [name]     Diverticulosis 06/20/2017   Hemorrhoids, external 06/20/2017   Lumbar spondylosis 06/20/2017   Fairly significant L4-L5 facet arthropathy with effusion.  Can consider facet injections and potentially RFA down the road.   NEUROPATHY 03/01/2008   Qualifier: Diagnosis of  By: Darrick Penna MD, [name]     Paroxysmal atrial fibrillation (HCC)    Pulmonary insufficiency 03/09/2018   moderate PI noted on echo 03/09/18   Pulmonary vein stenosis (HCC)    left inferior pulmonary vein stenosis   Right inguinal hernia 06/17/2014   Sinus bradycardia    Strain of gluteus medius 01/29/2017   Stroke (HCC) 05/2020   Vertigo    Patient Active Problem List   Diagnosis Date Noted   Insomnia 09/10/2023   Nephrolithiasis 09/10/2023   Sleep-disordered breathing 12/08/2022   History of stroke    Allergic rhinitis 02/24/2022   Dyslipidemia 09/02/2021   Aortic atherosclerosis (HCC) 06/13/2021   Stroke (HCC) 06/22/2020   Arthralgia 07/14/2019   Pulmonary vein stenosis s/p stenting 2020 01/02/2019   Chronic neck pain 08/23/2018   Tinnitus 08/23/2018   Lipoma 08/23/2018   Paroxysmal atrial fibrillation (HCC) 05/25/2018   Diverticulosis 06/20/2017   Hemorrhoids, external 06/20/2017   Urinary hesitancy 06/17/2014   BPPV (benign paroxysmal positional vertigo) 07/01/2011   Osteoarthritis 10/16/2009   NEUROPATHY 03/01/2008   Past Surgical History:  Procedure Laterality Date   ANKLE ARTHROSCOPY     ATRIAL FIBRILLATION ABLATION N/A 05/25/2018   Procedure: ATRIAL FIBRILLATION ABLATION;  Surgeon: Hillis Range, MD;  Location: [facility] INVASIVE CV [facility];  Service: Cardiovascular;  Laterality: N/A;   BUBBLE STUDY  03/25/2022   Procedure: BUBBLE STUDY;  Surgeon: Parke Poisson, MD;  Location: [facility] ENDOSCOPY;   Service: Cardiology;;   KNEE ARTHROSCOPY Left 11/2021   ROTATOR CUFF REPAIR Left 06/24/2021   TEE WITHOUT CARDIOVERSION N/A 03/25/2022   Procedure: TRANSESOPHAGEAL ECHOCARDIOGRAM (TEE);  Surgeon: Parke Poisson, MD;  Location: [facility] ENDOSCOPY;  Service: Cardiology;  Laterality: N/A;    Family History  Problem Relation Age of Onset   Hearing loss Father    Colon cancer Neg Hx     Medications- reviewed and updated Current Outpatient Medications  Medication Sig Dispense Refill   apixaban (ELIQUIS) 5 MG TABS tablet Take 1 tablet (5 mg total) by mouth 2 (two) times daily. 180 tablet 1   atorvastatin (LIPITOR) 80 MG tablet TAKE 1 TABLET BY MOUTH EVERY DAY 90 tablet 3   BOOSTRIX 5-2.5-18.5 LF-MCG/0.5 injection      Coenzyme Q10 100 MG capsule Take 100 mg by mouth daily.     Multiple Vitamins-Minerals (MULTIVITAMIN WITH MINERALS) tablet Take 1 tablet by mouth daily.      tamsulosin (FLOMAX) 0.4 MG CAPS capsule TAKE 1 CAPSULE BY MOUTH EVERY DAY 90 capsule 1   diazepam (VALIUM) 5 MG tablet Take 1 tablet (5 mg total) by mouth daily as needed for anxiety. 30 tablet 1   traMADol (ULTRAM) 50 MG tablet Take 1 tablet (50 mg total) by mouth every 6 (six) hours as  needed for moderate pain (pain score 4-6). 30 tablet 5   No current facility-administered medications for this visit.    Allergies-reviewed and updated Allergies  Allergen Reactions   Cefdinir Palpitations and Rash   Codeine Other (See Comments)    Sensitive to this = agitation    Social History   Socioeconomic History   Marital status: Legally Separated    Spouse name: Not on file   Number of children: Not on file   Years of education: Not on file   Highest education level: Not on file  Occupational History   Occupation: retired  Tobacco Use   Smoking status: Never   Smokeless tobacco: Never  Vaping Use   Vaping status: Never Used  Substance and Sexual Activity   Alcohol use: Yes    Comment: Occasionally   Drug use: No    Sexual activity: Not Currently  Other Topics Concern   Not on file  Social History Narrative   Lives alone   Right Handed   Drinks 3-4 cups caffeine daily   Social Determinants of Health   Financial Resource Strain: Low Risk  (06/30/2023)   Overall Financial Resource Strain (CARDIA)    Difficulty of Paying Living Expenses: Not hard at all  Food Insecurity: No Food Insecurity (06/30/2023)   Hunger Vital Sign    Worried About Running Out of Food in the Last Year: Never true    Ran Out of Food in the Last Year: Never true  Transportation Needs: No Transportation Needs (06/30/2023)   PRAPARE - Administrator, Civil Service (Medical): No    Lack of Transportation (Non-Medical): No  Physical Activity: Sufficiently Active (06/30/2023)   Exercise Vital Sign    Days of Exercise per Week: 6 days    Minutes of Exercise per Session: 90 min  Stress: No Stress Concern Present (06/30/2023)   [employer] of Occupational Health - Occupational Stress Questionnaire    Feeling of Stress : Not at all  Social Connections: Moderately Integrated (06/30/2023)   Social Connection and Isolation Panel [NHANES]    Frequency of Communication with Friends and Family: More than three times a week    Frequency of Social Gatherings with Friends and Family: More than three times a week    Attends Religious Services: 1 to 4 times per year    Active Member of [employer] or Organizations: Yes    Attends Engineer, structural: More than 4 times per year    Marital Status: Separated        Objective:  Physical Exam: BP 119/69   Pulse (!) 54   Temp (!) 97.3 F (36.3 C) (Temporal)   Ht 5\' 7"  (1.702 m)   Wt 158 lb 6.4 oz (71.8 kg)   SpO2 97%   BMI 24.81 kg/m   Body mass index is 24.81 kg/m. Wt Readings from Last 3 Encounters:  09/10/23 158 lb 6.4 oz (71.8 kg)  07/06/23 155 lb (70.3 kg)  03/26/23 160 lb (72.6 kg)   Gen: NAD, resting comfortably HEENT: TMs normal bilaterally. OP clear. No  thyromegaly noted.  CV: RRR with no murmurs appreciated Pulm: NWOB, CTAB with no crackles, wheezes, or rhonchi GI: Normal bowel sounds present. Soft, Nontender, Nondistended. MSK: no edema, cyanosis, or clubbing noted Skin: warm, dry Neuro: CN2-12 grossly intact. Strength 5/5 in upper and lower extremities. Reflexes symmetric and intact bilaterally.  Psych: Normal affect and thought content     Wisam Siefring M. Jimmey Ralph, MD 09/10/2023 9:45 AM

## 2023-09-14 NOTE — Progress Notes (Signed)
Great news!  Labs are all stable.  Sugar is borderline elevated but stable.  The rest of his labs are normal.  He should keep up the great work with diet and exercise and we can recheck everything in a year or so.

## 2023-11-29 ENCOUNTER — Other Ambulatory Visit: Payer: Self-pay | Admitting: Internal Medicine

## 2023-11-29 DIAGNOSIS — I480 Paroxysmal atrial fibrillation: Secondary | ICD-10-CM

## 2023-12-03 ENCOUNTER — Encounter: Payer: Self-pay | Admitting: Internal Medicine

## 2023-12-03 DIAGNOSIS — I480 Paroxysmal atrial fibrillation: Secondary | ICD-10-CM

## 2023-12-03 MED ORDER — APIXABAN 5 MG PO TABS: 5.0000 mg | ORAL_TABLET | Freq: Two times a day (BID) | ORAL | 1 refills | Status: DC

## 2023-12-03 NOTE — Telephone Encounter (Signed)
 Prescription refill request for Eliquis  received. Indication: Afib  Last office visit: 03/26/23 Chancy Comber)  Scr: 0.76 (09/10/23)  Age: 73 Weight: 71.8kg  Appropriate dose. Refill sent.

## 2023-12-22 DIAGNOSIS — M1712 Unilateral primary osteoarthritis, left knee: Secondary | ICD-10-CM | POA: Diagnosis not present

## 2024-01-31 ENCOUNTER — Other Ambulatory Visit: Payer: Self-pay

## 2024-01-31 ENCOUNTER — Emergency Department (HOSPITAL_BASED_OUTPATIENT_CLINIC_OR_DEPARTMENT_OTHER): Admitting: Radiology

## 2024-01-31 ENCOUNTER — Encounter (HOSPITAL_BASED_OUTPATIENT_CLINIC_OR_DEPARTMENT_OTHER): Payer: Self-pay | Admitting: Emergency Medicine

## 2024-01-31 ENCOUNTER — Emergency Department (HOSPITAL_BASED_OUTPATIENT_CLINIC_OR_DEPARTMENT_OTHER)

## 2024-01-31 ENCOUNTER — Emergency Department (HOSPITAL_BASED_OUTPATIENT_CLINIC_OR_DEPARTMENT_OTHER)
Admission: EM | Admit: 2024-01-31 | Discharge: 2024-01-31 | Disposition: A | Discharge status: Home / Self Care | Attending: Emergency Medicine | Admitting: Emergency Medicine

## 2024-01-31 DIAGNOSIS — Z7901 Long term (current) use of anticoagulants: Secondary | ICD-10-CM | POA: Diagnosis not present

## 2024-01-31 DIAGNOSIS — S61311A Laceration without foreign body of left index finger with damage to nail, initial encounter: Secondary | ICD-10-CM | POA: Insufficient documentation

## 2024-01-31 DIAGNOSIS — M79645 Pain in left finger(s): Secondary | ICD-10-CM | POA: Diagnosis not present

## 2024-01-31 DIAGNOSIS — W269XXA Contact with unspecified sharp object(s), initial encounter: Secondary | ICD-10-CM | POA: Insufficient documentation

## 2024-01-31 DIAGNOSIS — S61319A Laceration without foreign body of unspecified finger with damage to nail, initial encounter: Secondary | ICD-10-CM

## 2024-01-31 DIAGNOSIS — Z8673 Personal history of transient ischemic attack (TIA), and cerebral infarction without residual deficits: Secondary | ICD-10-CM | POA: Diagnosis not present

## 2024-01-31 DIAGNOSIS — S6992XA Unspecified injury of left wrist, hand and finger(s), initial encounter: Secondary | ICD-10-CM | POA: Diagnosis not present

## 2024-01-31 MED ORDER — BUPIVACAINE HCL (PF) 0.5 % IJ SOLN
30.0000 mL | Freq: Once | INTRAMUSCULAR | Status: DC
  Filled 2024-01-31: qty 30

## 2024-01-31 MED ORDER — DOXYCYCLINE HYCLATE 100 MG PO CAPS: 100.0000 mg | ORAL_CAPSULE | Freq: Two times a day (BID) | ORAL | 0 refills | Status: DC

## 2024-01-31 MED ORDER — DOXYCYCLINE HYCLATE 100 MG PO TABS
100.0000 mg | ORAL_TABLET | Freq: Once | ORAL | Status: AC
  Administered 2024-01-31: 100 mg via ORAL
  Filled 2024-01-31: qty 1

## 2024-01-31 NOTE — ED Notes (Signed)
 Dc instructions reviewed with patient. Patient voiced understanding. Dc with belongings.
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
## 2024-01-31 NOTE — ED Provider Notes (Signed)
 [place] EMERGENCY DEPARTMENT AT [facility] Provider Note   CSN: [id number] Arrival date & time: 01/31/24  1223     History  No chief complaint on file.   William Underwood is a 73 y.o. male.  Pt is a 73 yo male with pmhx significant for BPH, cva, and hx afib (on Eliquis).  Pt was working on a bike and injured his left index finger.  No other injuries.       Home Medications Prior to Admission medications   Medication Sig Start Date End Date Taking? Authorizing Provider  doxycycline (VIBRAMYCIN) 100 MG capsule Take 1 capsule (100 mg total) by mouth 2 (two) times daily. 01/31/24  Yes Jacalyn Lefevre, MD  apixaban (ELIQUIS) 5 MG TABS tablet Take 1 tablet (5 mg total) by mouth 2 (two) times daily. 12/03/23   Parke Poisson, MD  atorvastatin (LIPITOR) 80 MG tablet TAKE 1 TABLET BY MOUTH EVERY DAY 08/31/23   Ardith Dark, MD  BOOSTRIX 5-2.5-18.5 LF-MCG/0.5 injection  01/16/23   [provider]  Coenzyme Q10 100 MG capsule Take 100 mg by mouth daily.    [provider]  diazepam (VALIUM) 5 MG tablet Take 1 tablet (5 mg total) by mouth daily as needed for anxiety. 09/10/23   Ardith Dark, MD  Multiple Vitamins-Minerals (MULTIVITAMIN WITH MINERALS) tablet Take 1 tablet by mouth daily.     [provider]  tamsulosin (FLOMAX) 0.4 MG CAPS capsule TAKE 1 CAPSULE BY MOUTH EVERY DAY 12/01/22   Ardith Dark, MD  traMADol (ULTRAM) 50 MG tablet Take 1 tablet (50 mg total) by mouth every 6 (six) hours as needed for moderate pain (pain score 4-6). 09/10/23   Ardith Dark, MD      Allergies    Cefdinir and Codeine    Review of Systems   Review of Systems  Musculoskeletal:        Left index finger lac  All other systems reviewed and are negative.   Physical Exam Updated Vital Signs BP 130/82 (BP Location: Right Arm)   Pulse (!) 55   Temp 97.9 F (36.6 C) (Oral)   Resp 18   Ht 5\' 7"  (1.702 m)   Wt 70.3 kg   SpO2 100%   BMI 24.28 kg/m   Physical Exam Vitals and nursing note reviewed.  Constitutional:      Appearance: Normal appearance.  HENT:     Head: Normocephalic and atraumatic.     Right Ear: External ear normal.     Left Ear: External ear normal.     Nose: Nose normal.     Mouth/Throat:     Mouth: Mucous membranes are moist.     Pharynx: Oropharynx is clear.  Eyes:     Extraocular Movements: Extraocular movements intact.     Conjunctiva/sclera: Conjunctivae normal.     Pupils: Pupils are equal, round, and reactive to light.  Cardiovascular:     Rate and Rhythm: Normal rate and regular rhythm.     Pulses: Normal pulses.     Heart sounds: Normal heart sounds.  Pulmonary:     Effort: Pulmonary effort is normal.     Breath sounds: Normal breath sounds.  Abdominal:     General: Abdomen is flat. Bowel sounds are normal.     Palpations: Abdomen is soft.  Musculoskeletal:        General: Normal range of motion.     Cervical back: Normal range of motion and neck supple.  Comments: Left index finger distal nail avulsion  Skin:    General: Skin is warm.     Capillary Refill: Capillary refill takes less than 2 seconds.  Neurological:     General: No focal deficit present.     Mental Status: He is alert and oriented to person, place, and time.  Psychiatric:        Mood and Affect: Mood normal.        Behavior: Behavior normal.     ED Results / Procedures / Treatments   Labs (all labs ordered are listed, but only abnormal results are displayed) Labs Reviewed - No data to display  EKG None  Radiology DG Finger Index Left Result Date: 01/31/2024 CLINICAL DATA:  Trauma to the left index finger.  Pain. EXAM: LEFT INDEX FINGER 2+V COMPARISON:  None Available. FINDINGS: There is no acute fracture or dislocation. No significant arthritic changes. There is elevation of the nail bed. Soft tissue swelling of the index finger. No opaque foreign object. IMPRESSION: 1. No acute fracture or dislocation. 2. Soft  tissue swelling of the index finger. Electronically Signed   By: Elgie Collard M.D.   On: 01/31/2024 13:48    Procedures Procedures    Medications Ordered in ED Medications  bupivacaine(PF) (MARCAINE) 0.5 % injection 30 mL (has no administration in time range)  doxycycline (VIBRA-TABS) tablet 100 mg (has no administration in time range)    ED Course/ Medical Decision Making/ A&P                                 Medical Decision Making Amount and/or Complexity of Data Reviewed Radiology: ordered.  Risk Prescription drug management.   This patient presents to the ED for concern of injury, this involves an extensive number of treatment options, and is a complaint that carries with it a high risk of complications and morbidity.  The differential diagnosis includes lac, fx   Co morbidities that complicate the patient evaluation  BPH, cva, and hx afib (on Eliquis)   Additional history obtained:  Additional history obtained from epic chart review External records from outside source obtained and reviewed including family  Imaging Studies ordered:  I ordered imaging studies including left index finger  I independently visualized and interpreted imaging which showed  . No acute fracture or dislocation.  2. Soft tissue swelling of the index finger.   I agree with the radiologist interpretation   Medicines ordered and prescription drug management:  I have reviewed the patients home medicines and have made adjustments as needed  Problem List / ED Course:  Index finger nailbed lac:  lac repaired by PA Roxan Hockey.  Pt has an appt with Dr. Amanda Pea in a week for another problem.  He is told to show him his injury at that appt.  He is to return for increased redness/swelling.   Reevaluation:  After the interventions noted above, I reevaluated the patient and found that they have :improved   Social Determinants of Health:  Lives at home   Dispostion:  After  consideration of the diagnostic results and the patients response to treatment, I feel that the patent would benefit from discharge with outpatient f/u.          Final Clinical Impression(s) / ED Diagnoses Final diagnoses:  Laceration of nail bed of finger, initial encounter    Rx / DC Orders ED Discharge Orders  Ordered    doxycycline (VIBRAMYCIN) 100 MG capsule  2 times daily        01/31/24 1458              Jacalyn Lefevre, MD 01/31/24 1500

## 2024-02-02 DIAGNOSIS — H524 Presbyopia: Secondary | ICD-10-CM | POA: Diagnosis not present

## 2024-02-02 DIAGNOSIS — H04123 Dry eye syndrome of bilateral lacrimal glands: Secondary | ICD-10-CM | POA: Diagnosis not present

## 2024-02-04 DIAGNOSIS — S67191A Crushing injury of left index finger, initial encounter: Secondary | ICD-10-CM | POA: Diagnosis not present

## 2024-02-04 DIAGNOSIS — M65311 Trigger thumb, right thumb: Secondary | ICD-10-CM | POA: Diagnosis not present

## 2024-02-04 DIAGNOSIS — M13842 Other specified arthritis, left hand: Secondary | ICD-10-CM | POA: Diagnosis not present

## 2024-02-04 DIAGNOSIS — Z4789 Encounter for other orthopedic aftercare: Secondary | ICD-10-CM | POA: Diagnosis not present

## 2024-02-04 DIAGNOSIS — G5601 Carpal tunnel syndrome, right upper limb: Secondary | ICD-10-CM | POA: Diagnosis not present

## 2024-02-11 DIAGNOSIS — M65311 Trigger thumb, right thumb: Secondary | ICD-10-CM | POA: Diagnosis not present

## 2024-02-25 DIAGNOSIS — G5601 Carpal tunnel syndrome, right upper limb: Secondary | ICD-10-CM | POA: Diagnosis not present

## 2024-02-25 DIAGNOSIS — M65941 Unspecified synovitis and tenosynovitis, right hand: Secondary | ICD-10-CM | POA: Diagnosis not present

## 2024-02-25 DIAGNOSIS — Z4789 Encounter for other orthopedic aftercare: Secondary | ICD-10-CM | POA: Diagnosis not present

## 2024-02-25 DIAGNOSIS — M13842 Other specified arthritis, left hand: Secondary | ICD-10-CM | POA: Diagnosis not present

## 2024-02-25 DIAGNOSIS — M19041 Primary osteoarthritis, right hand: Secondary | ICD-10-CM | POA: Diagnosis not present

## 2024-02-25 DIAGNOSIS — M65311 Trigger thumb, right thumb: Secondary | ICD-10-CM | POA: Diagnosis not present

## 2024-03-10 DIAGNOSIS — R3982 Chronic bladder pain: Secondary | ICD-10-CM | POA: Diagnosis not present

## 2024-03-10 DIAGNOSIS — N3943 Post-void dribbling: Secondary | ICD-10-CM | POA: Diagnosis not present

## 2024-03-10 DIAGNOSIS — N401 Enlarged prostate with lower urinary tract symptoms: Secondary | ICD-10-CM | POA: Diagnosis not present

## 2024-03-29 ENCOUNTER — Other Ambulatory Visit: Payer: Self-pay | Admitting: Urology

## 2024-03-29 DIAGNOSIS — N401 Enlarged prostate with lower urinary tract symptoms: Secondary | ICD-10-CM

## 2024-04-25 NOTE — Progress Notes (Signed)
 Chief Complaint: Patient was seen in consultation today for benign prostatic hyperplasia.   Referring Physician(s): Herrick,Benjamin W  History of Present Illness: William Underwood is a 73 y.o. male with a medical history significant for atrial fibrillation s/p ablation on anticoagulation, pulmonary vein stenting, stroke, vertigo, kidney stones, prostatitis and benign prostatic hypertrophy with lower urinary tract symptoms. His last PSA 09/10/23 was normal at 1.67.  He has been struggling with dysuria, suprapubic pain and urinary hesitancy. He takes Flomax  0.4 mg daily. He has been evaluated numerous times over the years for his dysuria/pelvic floor pain and has been treated several times for prostatitis. He is an avid cyclist and running and his pain recently has been severe. The pain is located in the suprapubic region and is worse when his bladder is full. The pain seems less noticeable when he is exercising and is significantly worse at night. At night he has a very weak stream and has to strain to void. He gets up approximately 5 times each night to urinate.   The patient has been kindly referred to Interventional Radiology by Dr. Cam to discuss prostate artery embolization.           Past Medical History:  Diagnosis Date   Benign prostatic hyperplasia without urinary obstruction 06/17/2014   BPPV (benign paroxysmal positional vertigo) 07/01/2011   Chronic hyperkalemia 05/20/2012   Overview:  Hx of for years   Concussion syndrome 05/15/2011   This seems like a moderately severe concussion with at least 1 minute loss of consciousness in 15-20 minutes of significant confusion including being unable to state his name    DEGENERATIVE JOINT DISEASE, HANDS 10/16/2009   Qualifier: Diagnosis of  By: HARVEY MD, [name]     Diverticulosis 06/20/2017   Hemorrhoids, external 06/20/2017   Lumbar spondylosis 06/20/2017   Fairly significant L4-L5 facet arthropathy with effusion.  Can  consider facet injections and potentially RFA down the road.   NEUROPATHY 03/01/2008   Qualifier: Diagnosis of  By: HARVEY MD, [name]     Paroxysmal atrial fibrillation (HCC)    Pulmonary insufficiency 03/09/2018   moderate PI noted on echo 03/09/18   Pulmonary vein stenosis (HCC)    left inferior pulmonary vein stenosis   Right inguinal hernia 06/17/2014   Sinus bradycardia    Strain of gluteus medius 01/29/2017   Stroke (HCC) 05/2020   Vertigo     Past Surgical History:  Procedure Laterality Date   ANKLE ARTHROSCOPY     ATRIAL FIBRILLATION ABLATION N/A 05/25/2018   Procedure: ATRIAL FIBRILLATION ABLATION;  Surgeon: Kelsie Agent, MD;  Location: [facility] INVASIVE CV LAB;  Service: Cardiovascular;  Laterality: N/A;   BUBBLE STUDY  03/25/2022   Procedure: BUBBLE STUDY;  Surgeon: Loni Soyla LABOR, MD;  Location: [facility] ENDOSCOPY;  Service: Cardiology;;   KNEE ARTHROSCOPY Left 11/2021   ROTATOR CUFF REPAIR Left 06/24/2021   TEE WITHOUT CARDIOVERSION N/A 03/25/2022   Procedure: TRANSESOPHAGEAL ECHOCARDIOGRAM (TEE);  Surgeon: Loni Soyla LABOR, MD;  Location: [facility] ENDOSCOPY;  Service: Cardiology;  Laterality: N/A;    Allergies: Cefdinir and Codeine  Medications: Prior to Admission medications   Medication Sig Start Date End Date Taking? Authorizing Provider  apixaban  (ELIQUIS ) 5 MG TABS tablet Take 1 tablet (5 mg total) by mouth 2 (two) times daily. 12/03/23   Acharya, Gayatri A, MD  atorvastatin  (LIPITOR ) 80 MG tablet TAKE 1 TABLET BY MOUTH EVERY DAY 08/31/23   Kennyth Worth HERO, MD  BOOSTRIX 5-2.5-18.5 LF-MCG/0.5 injection  01/16/23  [provider]  Coenzyme Q10 100 MG capsule Take 100 mg by mouth daily.    [provider]  diazepam  (VALIUM ) 5 MG tablet Take 1 tablet (5 mg total) by mouth daily as needed for anxiety. 09/10/23   Kennyth Worth HERO, MD  doxycycline  (VIBRAMYCIN ) 100 MG capsule Take 1 capsule (100 mg total) by mouth 2 (two) times daily. 01/31/24   Haviland, Julie, MD   Multiple Vitamins-Minerals (MULTIVITAMIN WITH MINERALS) tablet Take 1 tablet by mouth daily.     [provider]  tamsulosin  (FLOMAX ) 0.4 MG CAPS capsule TAKE 1 CAPSULE BY MOUTH EVERY DAY 12/01/22   Kennyth Worth HERO, MD  traMADol  (ULTRAM ) 50 MG tablet Take 1 tablet (50 mg total) by mouth every 6 (six) hours as needed for moderate pain (pain score 4-6). 09/10/23   Kennyth Worth HERO, MD     Family History  Problem Relation Age of Onset   Hearing loss Father    Colon cancer Neg Hx     Social History   Socioeconomic History   Marital status: Legally Separated    Spouse name: Not on file   Number of children: Not on file   Years of education: Not on file   Highest education level: Not on file  Occupational History   Occupation: retired  Tobacco Use   Smoking status: Never   Smokeless tobacco: Never  Vaping Use   Vaping status: Never Used  Substance and Sexual Activity   Alcohol use: Yes    Comment: Occasionally   Drug use: No   Sexual activity: Not Currently  Other Topics Concern   Not on file  Social History Narrative   Lives alone   Right Handed   Drinks 3-4 cups caffeine daily   Social Drivers of Health   Financial Resource Strain: Low Risk  (06/30/2023)   Overall Financial Resource Strain (CARDIA)    Difficulty of Paying Living Expenses: Not hard at all  Food Insecurity: No Food Insecurity (06/30/2023)   Hunger Vital Sign    Worried About Running Out of Food in the Last Year: Never true    Ran Out of Food in the Last Year: Never true  Transportation Needs: No Transportation Needs (06/30/2023)   PRAPARE - Administrator, Civil Service (Medical): No    Lack of Transportation (Non-Medical): No  Physical Activity: Sufficiently Active (06/30/2023)   Exercise Vital Sign    Days of Exercise per Week: 6 days    Minutes of Exercise per Session: 90 min  Stress: No Stress Concern Present (06/30/2023)   [employer] of Occupational Health - Occupational Stress  Questionnaire    Feeling of Stress : Not at all  Social Connections: Moderately Integrated (06/30/2023)   Social Connection and Isolation Panel    Frequency of Communication with Friends and Family: More than three times a week    Frequency of Social Gatherings with Friends and Family: More than three times a week    Attends Religious Services: 1 to 4 times per year    Active Member of [employer] or Organizations: Yes    Attends Engineer, structural: More than 4 times per year    Marital Status: Separated      Review of Systems: A 12 point ROS discussed and pertinent positives are indicated in the HPI above.  All other systems are negative.  Review of Systems  Vital Signs: There were no vitals taken for this visit.  Advance Care Plan: The advanced  care plan/surrogate decision maker was discussed at the time of visit and documented in the medical record.    Physical Exam  Imaging: No results found.  Labs:  CBC: Recent Labs    09/10/23 0936  WBC 7.3  HGB 15.1  HCT 45.9  PLT 206.0    COAGS: No results for input(s): INR, APTT in the last 8760 hours.  BMP: Recent Labs    09/10/23 0936  NA 141  K 4.9  CL 107  CO2 29  GLUCOSE 101*  BUN 16  CALCIUM  9.4  CREATININE 0.76    LIVER FUNCTION TESTS: Recent Labs    09/10/23 0936  BILITOT 0.6  AST 24  ALT 25  ALKPHOS 53  PROT 6.3  ALBUMIN 4.1    TUMOR MARKERS: No results for input(s): AFPTM, CEA, CA199, CHROMGRNA in the last 8760 hours.  Assessment and Plan:  73 year old male with a history of benign prostatic hyperplasia with lower urinary tract symptoms.   Thank you for this interesting consult.  I greatly enjoyed meeting William Underwood and look forward to participating in their care.  A copy of this report was sent to the requesting provider on this date.  Ester Sides, MD Pager: [phone] .   I spent a total of  40 Minutes   in face to face in clinical consultation, greater  than 50% of which was counseling/coordinating care for benign prostatic hyperplasia.

## 2024-04-27 ENCOUNTER — Other Ambulatory Visit: Payer: Self-pay | Admitting: Interventional Radiology

## 2024-04-27 ENCOUNTER — Inpatient Hospital Stay
Admission: RE | Admit: 2024-04-27 | Discharge: 2024-04-27 | Disposition: A | Discharge status: Home / Self Care | Source: Ambulatory Visit | Attending: Urology

## 2024-04-27 DIAGNOSIS — N401 Enlarged prostate with lower urinary tract symptoms: Secondary | ICD-10-CM | POA: Diagnosis not present

## 2024-04-27 DIAGNOSIS — R3912 Poor urinary stream: Secondary | ICD-10-CM | POA: Diagnosis not present

## 2024-04-27 DIAGNOSIS — R300 Dysuria: Secondary | ICD-10-CM | POA: Diagnosis not present

## 2024-04-27 DIAGNOSIS — R3911 Hesitancy of micturition: Secondary | ICD-10-CM | POA: Diagnosis not present

## 2024-04-27 HISTORY — PX: IR RADIOLOGIST EVAL & MGMT: IMG5224

## 2024-05-02 ENCOUNTER — Other Ambulatory Visit

## 2024-06-01 ENCOUNTER — Other Ambulatory Visit: Payer: Self-pay | Admitting: Internal Medicine

## 2024-06-01 DIAGNOSIS — I480 Paroxysmal atrial fibrillation: Secondary | ICD-10-CM

## 2024-06-06 ENCOUNTER — Other Ambulatory Visit: Payer: Self-pay

## 2024-06-06 DIAGNOSIS — I480 Paroxysmal atrial fibrillation: Secondary | ICD-10-CM

## 2024-06-06 MED ORDER — APIXABAN 5 MG PO TABS: 5.0000 mg | ORAL_TABLET | Freq: Two times a day (BID) | ORAL | 1 refills | Status: AC

## 2024-06-06 NOTE — Telephone Encounter (Signed)
 Prescription refill request for Eliquis  received. Indication:afib Last office visit:upcoming Scr:0.76  11/24 Age: 73 Weight:70.3  kg  Prescription refilled

## 2024-06-08 ENCOUNTER — Ambulatory Visit: Payer: Self-pay | Admitting: Family Medicine

## 2024-06-08 NOTE — Telephone Encounter (Signed)
 FYI Only or Action Required?: FYI only for provider.  Patient was last seen in primary care on 09/10/2023 by Kennyth Worth HERO, MD.  Called Nurse Triage reporting Urinary Frequency.  Symptoms began several days ago.  Symptoms are: unchanged.  Triage Disposition: See PCP Within 2 Weeks               Patient/caregiver understands and will follow disposition?:  Reason for Disposition  All other urine symptoms  Answer Assessment - Initial Assessment Questions Patient scheduled for first available appt with PCP on 8/20. Pt states he is going to see how he does the next couple of days. This RN educated pt on new-worsening symptoms and when to call back/seek emergent care. Pt verbalized understanding and agrees to plan.     SYMPTOM: What's the main symptom you're concerned about? (e.g., frequency, incontinence)     Pain in abdomen ONSET: When did the  symptom  start?     Several days PAIN: Is there any pain? If Yes, ask: How bad is it? (Scale: 1-10; mild, moderate, severe)     4-5/10 intermittent OTHER SYMPTOMS: Do you have any other symptoms? (e.g., blood in urine, fever, flank pain, pain with urination)     Denies burning with urination  Protocols used: Urinary Symptoms-A-AH

## 2024-06-09 ENCOUNTER — Encounter: Payer: Self-pay | Admitting: Family Medicine

## 2024-06-09 NOTE — Telephone Encounter (Signed)
 Noted

## 2024-06-13 ENCOUNTER — Encounter: Payer: Self-pay | Admitting: Family Medicine

## 2024-06-13 ENCOUNTER — Ambulatory Visit (INDEPENDENT_AMBULATORY_CARE_PROVIDER_SITE_OTHER): Admitting: Family Medicine

## 2024-06-13 VITALS — BP 126/71 | HR 60 | Temp 97.7°F | Ht 67.0 in | Wt 159.0 lb

## 2024-06-13 DIAGNOSIS — N400 Enlarged prostate without lower urinary tract symptoms: Secondary | ICD-10-CM | POA: Insufficient documentation

## 2024-06-13 DIAGNOSIS — R109 Unspecified abdominal pain: Secondary | ICD-10-CM | POA: Diagnosis not present

## 2024-06-13 DIAGNOSIS — I480 Paroxysmal atrial fibrillation: Secondary | ICD-10-CM | POA: Diagnosis not present

## 2024-06-13 NOTE — Assessment & Plan Note (Signed)
 Continue management per urology.  Considering prostate ablation as above.

## 2024-06-13 NOTE — Progress Notes (Signed)
 William Underwood is a 73 y.o. male who presents today for an office visit.  Assessment/Plan:  New/Acute Problems: Abdominal Pain  Had a lengthy discussion with patient today regarding his chronic abdominal pain.  His exam today was reassuring.  This has been ongoing for quite a while and has been following with urology for this.  They think that most of his symptoms were likely related to enlarged prostate and pelvic floor dysfunction.  They are planning on performing prostate arterialization soon.  Patient is concerned about potential radiation exposure and risk of this procedure and would like to explore other potential etiologies before pursuing this further.  We did discuss other potential etiologies including musculoskeletal such as a sports hernia or inguinal hernia.  He is additionally concerned about a potential gastrointestinal causes given his associated symptoms of gas and loose stools for the last few years.  We did discuss referral to sports medicine to further evaluate for musculoskeletal etiologies.  Also discussed obtaining a ultrasound to evaluate for hernia however he would like to hold off on this for now.  We will place referral to gastroenterology for further evaluation.  Did have a CT scan last year for kidney stones which did not show any other acute abnormalities-do not think we need to obtain any other imaging at this point.  We discussed reasons to return to care.  He has tramadol  to use as needed.  Can also use over-the-counter Tylenol .  Chronic Problems Addressed Today: Paroxysmal atrial fibrillation (HCC) Anticoagulated on Eliquis .  BPH (benign prostatic hyperplasia) Continue management per urology.  Considering prostate ablation as above.     Subjective:  HPI:  See assessment / plan for status of chronic conditions.   Discussed the use of AI scribe software for clinical note transcription with the patient, who gave verbal consent to proceed.  History of Present  Illness William Underwood is a 73 year old male who presents with persistent abdominal pain and concerns about prostate and gastrointestinal issues.  He experiences persistent abdominal pain located in the mid-abdomen, right below the navel. The pain is constant, sometimes mild, but can become severe enough to disrupt sleep. He describes it as a 'pressure' sensation that feels deep and is sometimes accompanied by significant gas. The pain has been ongoing for years and has worsened since May and June of this year.  He has a history of prostate issues and has been under the care of a urologist who performed a digital exam and a cystoscopy. He reports that his urologist performed a cystoscopy and told him that his bladder did not appear to have any significant abnormalities, and that his prostate was not overly large for his age. He experiences urinary symptoms typical of prostate issues, such as nocturia, getting up two to three times a night, and sometimes not at all. He is unable to take NSAIDs for pain relief due to being on Eliquis  and uses tramadol , which helps him cope with the pain rather than alleviating it.  There was concern for potential pelvic floor dysfunction.  He was referred to pelvic floor rehab specialist however they told him that his symptoms were not consistent with pelvic floor dysfunction and he did not pursue this further.  He is concerned about potential GI causes for his abdominal pain.  He reports loose stools that have been consistent for several years, describing them as a 'thick, loose paste' without any blood. No nausea or vomiting. He is concerned about the possibility  of colorectal cancer due to the persistent abdominal pain and loose stools, although he has not observed any blood in his stool.  He is physically active, regularly riding his bike, which does not exacerbate the pain immediately but may have a delayed effect. He has a history of a hernia but does not  currently feel like he has one.         Objective:  Physical Exam: BP 126/71   Pulse 60   Temp 97.7 F (36.5 C) (Temporal)   Ht 5' 7 (1.702 m)   Wt 159 lb (72.1 kg)   BMI 24.90 kg/m   Gen: No acute distress, resting comfortably GI: No deformities.  Bowel sounds present.  No appreciable hernias.  Nontender to palpation.  No rebound or guarding. Neuro: Grossly normal, moves all extremities Psych: Normal affect and thought content  Time Spent: 45 minutes of total time was spent on the date of the encounter performing the following actions: chart review prior to seeing the patient, reviewing previous imaging results, obtaining history, performing a medically necessary exam, counseling on the treatment plan, placing orders, and documenting in our EHR.        Worth HERO. Kennyth, MD 06/13/2024 12:28 PM

## 2024-06-13 NOTE — Telephone Encounter (Signed)
 LVM to come in 06/13/24 at 11:40 am with Kennyth

## 2024-06-13 NOTE — Assessment & Plan Note (Signed)
Anticoagulated on Eliquis

## 2024-06-13 NOTE — Patient Instructions (Signed)
 It was very nice to see you today!  VISIT SUMMARY: During your visit, we discussed your persistent abdominal pain, urinary symptoms, and chronic loose stools. We also reviewed your history of prostate issues and diverticulosis.  YOUR PLAN: CHRONIC LOWER ABDOMINAL PAIN WITH URINARY SYMPTOMS AND CHRONIC LOOSE STOOLS: You have been experiencing persistent abdominal pain, loose stools, and gas. We need to explore various potential causes, including prostate, bladder, gastrointestinal, and colorectal issues. -We will refer you to a gastroenterologist for further evaluation and a possible colonoscopy. This will help us  screen for colorectal issues and evaluate your symptoms. -We discussed the risks of prostate artery embolization (PAE) and decided to explore diagnostic options first. -If needed, we may consider an ultrasound to check for a hernia.  HISTORY OF DIVERTICULOSIS: You have a history of diverticulosis without any current acute diverticulitis. -We will include an evaluation of your diverticulosis in the referral to the gastroenterologist.  Return if symptoms worsen or fail to improve.   Take care, Dr Kennyth  PLEASE NOTE:  If you had any lab tests, please let us  know if you have not heard back within a few days. You may see your results on mychart before we have a chance to review them but we will give you a call once they are reviewed by us .   If we ordered any referrals today, please let us  know if you have not heard from their office within the next week.   If you had any urgent prescriptions sent in today, please check with the pharmacy within an hour of our visit to make sure the prescription was transmitted appropriately.   Please try these tips to maintain a healthy lifestyle:  Eat at least 3 REAL meals and 1-2 snacks per day.  Aim for no more than 5 hours between eating.  If you eat breakfast, please do so within one hour of getting up.   Each meal should contain half  fruits/vegetables, one quarter protein, and one quarter carbs (no bigger than a computer mouse)  Cut down on sweet beverages. This includes juice, soda, and sweet tea.   Drink at least 1 glass of water with each meal and aim for at least 8 glasses per day  Exercise at least 150 minutes every week.

## 2024-06-15 ENCOUNTER — Ambulatory Visit: Admitting: Family Medicine

## 2024-06-21 DIAGNOSIS — R3982 Chronic bladder pain: Secondary | ICD-10-CM | POA: Diagnosis not present

## 2024-06-21 DIAGNOSIS — R3912 Poor urinary stream: Secondary | ICD-10-CM | POA: Diagnosis not present

## 2024-06-21 DIAGNOSIS — N401 Enlarged prostate with lower urinary tract symptoms: Secondary | ICD-10-CM | POA: Diagnosis not present

## 2024-06-22 DIAGNOSIS — M65941 Unspecified synovitis and tenosynovitis, right hand: Secondary | ICD-10-CM | POA: Diagnosis not present

## 2024-06-22 DIAGNOSIS — M13842 Other specified arthritis, left hand: Secondary | ICD-10-CM | POA: Diagnosis not present

## 2024-06-22 DIAGNOSIS — G5601 Carpal tunnel syndrome, right upper limb: Secondary | ICD-10-CM | POA: Diagnosis not present

## 2024-06-22 DIAGNOSIS — M65311 Trigger thumb, right thumb: Secondary | ICD-10-CM | POA: Diagnosis not present

## 2024-06-29 ENCOUNTER — Encounter: Payer: Self-pay | Admitting: Internal Medicine

## 2024-06-29 ENCOUNTER — Ambulatory Visit: Attending: Internal Medicine | Admitting: Internal Medicine

## 2024-06-29 VITALS — BP 124/79 | HR 54 | Ht 67.0 in | Wt 158.4 lb

## 2024-06-29 DIAGNOSIS — R42 Dizziness and giddiness: Secondary | ICD-10-CM | POA: Diagnosis not present

## 2024-06-29 DIAGNOSIS — I480 Paroxysmal atrial fibrillation: Secondary | ICD-10-CM | POA: Diagnosis not present

## 2024-06-29 DIAGNOSIS — I340 Nonrheumatic mitral (valve) insufficiency: Secondary | ICD-10-CM | POA: Diagnosis not present

## 2024-06-29 DIAGNOSIS — Z8679 Personal history of other diseases of the circulatory system: Secondary | ICD-10-CM | POA: Diagnosis not present

## 2024-06-29 DIAGNOSIS — I700 Atherosclerosis of aorta: Secondary | ICD-10-CM

## 2024-06-29 DIAGNOSIS — I639 Cerebral infarction, unspecified: Secondary | ICD-10-CM

## 2024-06-29 DIAGNOSIS — Z9889 Other specified postprocedural states: Secondary | ICD-10-CM | POA: Diagnosis not present

## 2024-06-29 DIAGNOSIS — I288 Other diseases of pulmonary vessels: Secondary | ICD-10-CM

## 2024-06-29 DIAGNOSIS — D6869 Other thrombophilia: Secondary | ICD-10-CM

## 2024-06-29 NOTE — Progress Notes (Signed)
 Cardiology Office Note:  .   Date:  06/29/2024  ID:  William Underwood, DOB 21-Oct-1951, MRN [id number] PCP: Kennyth Worth HERO, MD  [facility] Providers Cardiologist:  Soyla DELENA Merck, MD Electrophysiologist:  Lynwood Rakers, MD (Inactive)    History of Present Illness: .   William Underwood is a 73 y.o. male.  Discussed the use of AI scribe software for clinical note transcription with the patient, who gave verbal consent to proceed.   He previously followed with EP status post pulmonary vein isolation for atrial fibrillation with Dr. Rakers, and subsequently followed at the [facility] for pulmonary vein stenosis following A. fib ablation.  He is undergone pulmonary vein stenting with restenosis of pulmonary vein stent, with no cardiopulmonary limitation on prior cardiopulmonary exercise testing.   Unfortunately in August 2021 he had an embolic appearing stroke.  He had continued on anticoagulation after pulmonary vein stenting for 1 year and instructed to stop by [facility] in April 2021.  He has not had a recurrence of atrial fibrillation that he is aware of and monitors his heart rhythm at home with wearable technology. At last visit, he had nearly recovered fully from prior CVA. He had two episodes which concern him for recurrent embolic events. He had orthostatic symptoms while rising from a seated position, with feeling extremely weak and both arms shaking. In addition he had an episode where he was biking and felt his left arm go numb. Unclear to him if this was related to a traumatic shoulder injury that occurred prior or if it was neurologic in origin. We performed a TEE for further evaluation of neurologic symptoms and previously assumed cardioembolic CVA, which was unremarkable for cause of stroke. 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  History of Present Illness William Underwood is a 73 year old male with atrial fibrillation and a history of stroke who presents for  cardiovascular evaluation and management.  He experiences episodes of orthostatic hypotension, particularly after biking and sitting, where he feels everything 'goes black' upon standing. These episodes are worse after biking, and he attributes them to dehydration and tamsulosin  use. He has not fainted but experiences brief blackouts and holds onto something for support. There is no shortness of breath or chest pain.  He is an avid road biker, riding approximately 150 miles a week, including long rides of 65 to 100 miles. He is preparing for a ride across   in a month. Despite being less fast than before, he completes his rides without significant issues.  He has atrial fibrillation and had a stroke four years ago. He underwent an ablation and has not experienced AFib episodes since, as confirmed by his watch and [employer] device. He questions whether his stroke was related to AFib, as he has not had detectable AFib episodes since his ablation.  He is currently on Eliquis  and atorvastatin  80 mg daily. He has no significant cholesterol issues, with an LDL of 66, triglycerides of 49, and HDL of 52.    ROS: negative except per HPI above.  Studies Reviewed: SABRA   EKG Interpretation Date/Time:  Wednesday June 29 2024 08:00:22 EDT Ventricular Rate:  54 PR Interval:  150 QRS Duration:  84 QT Interval:  424 QTC Calculation: 402 R Axis:   64  Text Interpretation: Sinus bradycardia When compared with ECG of 15-Jan-2023 18:14, Since last tracing st changes have normalized Confirmed by Vincent Streater ([id number]) on 06/29/2024 8:26:39 AM    Results LABS  Lipid profile: LDL 66, Triglycerides 49, HDL 52  RADIOLOGY Head and neck CT: Atheromatous change at left carotid bifurcation without significant stenosis  DIAGNOSTIC EKG: No atrial fibrillation (06/29/2024) TEE: Minimal plaque in aorta (2023) Risk Assessment/Calculations:    CHA2DS2-VASc Score = 3   This indicates a 3.2%  annual risk of stroke. The patient's score is based upon: CHF History: 0 HTN History: 0 Diabetes History: 0 Stroke History: 2 Vascular Disease History: 0 Age Score: 1 Gender Score: 0      Physical Exam:   VS:  BP 124/79 (BP Location: Left Arm, Patient Position: Sitting)   Pulse (!) 54   Ht 5' 7 (1.702 m)   Wt 158 lb 6.4 oz (71.8 kg)   SpO2 97%   BMI 24.81 kg/m    Wt Readings from Last 3 Encounters:  06/29/24 158 lb 6.4 oz (71.8 kg)  06/13/24 159 lb (72.1 kg)  01/31/24 155 lb (70.3 kg)     Physical Exam GENERAL: Alert, cooperative, well developed, no acute distress. HEENT: Normocephalic, normal oropharynx, moist mucous membranes. CHEST: Clear to auscultation bilaterally, no wheezes, rhonchi, or crackles. CARDIOVASCULAR: Normal heart rate and rhythm, S1 and S2 normal without murmurs. ABDOMEN: Soft, non-tender, non-distended, without organomegaly, normal bowel sounds. EXTREMITIES: No cyanosis or edema. NEUROLOGICAL: Cranial nerves grossly intact, moves all extremities without gross motor or sensory deficit.   ASSESSMENT AND PLAN: .    Assessment and Plan Assessment & Plan Orthostatic hypotension Vasodilation post exercise, likely venous pooling Orthostatic hypotension likely exacerbated by dehydration and tamsulosin . Symptoms post-exercise, feels like everything goes black when standing up after sitting for about an hour after biking, no syncope reported. - Advise compression socks during and post-exercise, suspect he has insufficient recovery of vasodilation post exercise, and likely has venous pooling leading to orthostasis.  - Ensure hydration with electrolyte drinks.  History of paroxysmal atrial fibrillation status post ablation No AFib detected since ablation. Regular monitoring shows no AFib.  - Refer to electrophysiologist for Watchman device evaluation. Participated in shared decision making regarding consideration of Watchman device.  - Continue AFib  monitoring with wearable devices.  History of ischemic stroke Stroke etiology uncertain, possibly minimal plaque or hypertension. No significant carotid stenosis. Atorvastatin  for prevention. - Continue atorvastatin  80 mg daily.  Mitral valve prolapse with regurgitation Mild mitral valve prolapse with mild regurgitation.  - Order echocardiogram to assess mitral valve function.  History of pulmonary artery stents History of pulmonary artery stents. Excellent functional capacity, no desaturation during exercise.        Soyla Merck, MD, FACC

## 2024-06-29 NOTE — Patient Instructions (Signed)
 Medication Instructions:  No Changes  Lab Work: None  Testing/Procedures: Your physician has requested that you have an echocardiogram, next available appointment. Echocardiography is a painless test that uses sound waves to create images of your heart. It provides your doctor with information about the size and shape of your heart and how well your heart's chambers and valves are working. This procedure takes approximately one hour. There are no restrictions for this procedure. Please do NOT wear cologne, perfume, aftershave, or lotions (deodorant is allowed). Please arrive 15 minutes prior to your appointment time.  Please note: We ask at that you not bring children with you during ultrasound (echo/ vascular) testing. Due to room size and safety concerns, children are not allowed in the ultrasound rooms during exams. Our front office staff cannot provide observation of children in our lobby area while testing is being conducted. An adult accompanying a patient to their appointment will only be allowed in the ultrasound room at the discretion of the ultrasound technician under special circumstances. We apologize for any inconvenience.   Follow-Up: At [facility], you and your health needs are our priority.  As part of our continuing mission to provide you with exceptional heart care, our providers are all part of one team.  This team includes your primary Cardiologist (physician) and Advanced Practice Providers or APPs (Physician Assistants and Nurse Practitioners) who all work together to provide you with the care you need, when you need it.  Your next appointment:   1 year(s) (We will mail a reminder letter around July 2026; please call at that time for an appointment for September 2026)  Provider:   Gayatri A Acharya, MD   Other Instructions Please wear Compression socks after Biking, during the day only. Remove them at bedtime. These can be purchased at many different stores,  pharmacies, and medical supply stores.

## 2024-07-11 ENCOUNTER — Ambulatory Visit (INDEPENDENT_AMBULATORY_CARE_PROVIDER_SITE_OTHER): Payer: PPO

## 2024-07-11 VITALS — Ht 67.0 in | Wt 155.0 lb

## 2024-07-11 DIAGNOSIS — Z0000 Encounter for general adult medical examination without abnormal findings: Secondary | ICD-10-CM

## 2024-07-11 NOTE — Progress Notes (Signed)
 Subjective:   William Underwood is a 73 y.o. who presents for a Medicare Wellness preventive visit.  As a reminder, Annual Wellness Visits don't include a physical exam, and some assessments may be limited, especially if this visit is performed virtually. We may recommend an in-person follow-up visit with your provider if needed.  Visit Complete: Virtual I connected with  [name] on 07/11/24 by a audio enabled telemedicine application and verified that I am speaking with the correct person using two identifiers.  Patient Location: Home  Provider Location: Office/Clinic  I discussed the limitations of evaluation and management by telemedicine. The patient expressed understanding and agreed to proceed.  Vital Signs: Because this visit was a virtual/telehealth visit, some criteria may be missing or patient reported. Any vitals not documented were not able to be obtained and vitals that have been documented are patient reported.  VideoDeclined- This patient declined Librarian, academic. Therefore the visit was completed with audio only.  Persons Participating in Visit: Patient.  AWV Questionnaire: No: Patient Medicare AWV questionnaire was not completed prior to this visit.  Cardiac Risk Factors include: advanced age (>97men, >75 women);dyslipidemia;male gender     Objective:    Today's Vitals   07/11/24 0824  Weight: 155 lb (70.3 kg)  Height: 5' 7 (1.702 m)   Body mass index is 24.28 kg/m.     07/11/2024    8:27 AM 01/31/2024   12:51 PM 07/06/2023   10:07 AM 01/15/2023    5:45 PM 09/16/2022    9:45 AM 03/25/2022   10:06 AM 09/03/2021    9:36 AM  Advanced Directives  Does Patient Have a Medical Advance Directive? Yes No Yes No Yes Yes Yes  Type of Estate agent of [place];Living will  Healthcare Power of [place];Living will  Healthcare Power of [place];Living will Healthcare Power of [employer] Power of Attorney   Copy of Healthcare Power of Attorney in Chart? No - copy requested  No - copy requested  No - copy requested No - copy requested No - copy requested  Would patient like information on creating a medical advance directive?  No - Patient declined         Current Medications (verified) Outpatient Encounter Medications as of 07/11/2024  Medication Sig   apixaban  (ELIQUIS ) 5 MG TABS tablet Take 1 tablet (5 mg total) by mouth 2 (two) times daily.   atorvastatin  (LIPITOR ) 80 MG tablet TAKE 1 TABLET BY MOUTH EVERY DAY   Coenzyme Q10 100 MG capsule Take 100 mg by mouth daily.   diazepam  (VALIUM ) 5 MG tablet Take 1 tablet (5 mg total) by mouth daily as needed for anxiety.   Multiple Vitamins-Minerals (MULTIVITAMIN WITH MINERALS) tablet Take 1 tablet by mouth daily.    tamsulosin  (FLOMAX ) 0.4 MG CAPS capsule TAKE 1 CAPSULE BY MOUTH EVERY DAY   traMADol  (ULTRAM ) 50 MG tablet Take 1 tablet (50 mg total) by mouth every 6 (six) hours as needed for moderate pain (pain score 4-6).   BOOSTRIX 5-2.5-18.5 LF-MCG/0.5 injection    [DISCONTINUED] doxycycline  (VIBRAMYCIN ) 100 MG capsule Take 1 capsule (100 mg total) by mouth 2 (two) times daily.   No facility-administered encounter medications on file as of 07/11/2024.    Allergies (verified) Cefdinir and Codeine   History: Past Medical History:  Diagnosis Date   Benign prostatic hyperplasia without urinary obstruction 06/17/2014   BPPV (benign paroxysmal positional vertigo) 07/01/2011   Chronic hyperkalemia 05/20/2012   Overview:  Hx of for years   Concussion syndrome 05/15/2011   This seems like a moderately severe concussion with at least 1 minute loss of consciousness in 15-20 minutes of significant confusion including being unable to state his name    DEGENERATIVE JOINT DISEASE, HANDS 10/16/2009   Qualifier: Diagnosis of  By: HARVEY MD, [name]     Diverticulosis 06/20/2017   Hemorrhoids, external 06/20/2017   Lumbar spondylosis 06/20/2017   Fairly  significant L4-L5 facet arthropathy with effusion.  Can consider facet injections and potentially RFA down the road.   NEUROPATHY 03/01/2008   Qualifier: Diagnosis of  By: HARVEY MD, [name]     Paroxysmal atrial fibrillation (HCC)    Pulmonary insufficiency 03/09/2018   moderate PI noted on echo 03/09/18   Pulmonary vein stenosis (HCC)    left inferior pulmonary vein stenosis   Right inguinal hernia 06/17/2014   Sinus bradycardia    Strain of gluteus medius 01/29/2017   Stroke (HCC) 05/2020   Vertigo    Past Surgical History:  Procedure Laterality Date   ANKLE ARTHROSCOPY     ATRIAL FIBRILLATION ABLATION N/A 05/25/2018   Procedure: ATRIAL FIBRILLATION ABLATION;  Surgeon: Kelsie Agent, MD;  Location: [facility] INVASIVE CV LAB;  Service: Cardiovascular;  Laterality: N/A;   BUBBLE STUDY  03/25/2022   Procedure: BUBBLE STUDY;  Surgeon: Loni Soyla LABOR, MD;  Location: [facility] ENDOSCOPY;  Service: Cardiology;;   IR RADIOLOGIST EVAL & MGMT  04/27/2024   KNEE ARTHROSCOPY Left 11/2021   ROTATOR CUFF REPAIR Left 06/24/2021   TEE WITHOUT CARDIOVERSION N/A 03/25/2022   Procedure: TRANSESOPHAGEAL ECHOCARDIOGRAM (TEE);  Surgeon: Loni Soyla LABOR, MD;  Location: [facility] ENDOSCOPY;  Service: Cardiology;  Laterality: N/A;   Family History  Problem Relation Age of Onset   Hearing loss Father    Colon cancer Neg Hx    Social History   Socioeconomic History   Marital status: Legally Separated    Spouse name: Not on file   Number of children: Not on file   Years of education: Not on file   Highest education level: Not on file  Occupational History   Occupation: retired  Tobacco Use   Smoking status: Never   Smokeless tobacco: Never  Vaping Use   Vaping status: Never Used  Substance and Sexual Activity   Alcohol use: Yes    Comment: Occasionally   Drug use: No   Sexual activity: Not Currently  Other Topics Concern   Not on file  Social History Narrative   Lives alone   Right Handed   Drinks 3-4 cups  caffeine daily   Social Drivers of Health   Financial Resource Strain: Low Risk  (07/11/2024)   Overall Financial Resource Strain (CARDIA)    Difficulty of Paying Living Expenses: Not hard at all  Food Insecurity: No Food Insecurity (07/11/2024)   Hunger Vital Sign    Worried About Running Out of Food in the Last Year: Never true    Ran Out of Food in the Last Year: Never true  Transportation Needs: No Transportation Needs (07/11/2024)   PRAPARE - Administrator, Civil Service (Medical): No    Lack of Transportation (Non-Medical): No  Physical Activity: Sufficiently Active (07/11/2024)   Exercise Vital Sign    Days of Exercise per Week: 6 days    Minutes of Exercise per Session: 150+ min  Stress: No Stress Concern Present (07/11/2024)   [employer] of Occupational Health - Occupational Stress Questionnaire    Feeling of  Stress: Not at all  Social Connections: Moderately Isolated (07/11/2024)   Social Connection and Isolation Panel    Frequency of Communication with Friends and Family: More than three times a week    Frequency of Social Gatherings with Friends and Family: More than three times a week    Attends Religious Services: 1 to 4 times per year    Active Member of [employer] or Organizations: No    Attends Banker Meetings: Never    Marital Status: Separated    Tobacco Counseling Counseling given: Not Answered    Clinical Intake:  Pre-visit preparation completed: Yes  Pain : No/denies pain     BMI - recorded: 24.28 Nutritional Status: BMI of 19-24  Normal Nutritional Risks: None Diabetes: No  Lab Results  Component Value Date   HGBA1C 5.8 09/10/2023   HGBA1C 5.8 09/08/2022   HGBA1C 5.6 09/02/2021     How often do you need to have someone help you when you read instructions, pamphlets, or other written materials from your doctor or pharmacy?: 1 - Never  Interpreter Needed?: No  Information entered by :: Ellouise Haws,  LPN   Activities of Daily Living     07/11/2024    8:26 AM  In your present state of health, do you have any difficulty performing the following activities:  Hearing? 0  Vision? 0  Difficulty concentrating or making decisions? 0  Walking or climbing stairs? 0  Dressing or bathing? 0  Doing errands, shopping? 0  Preparing Food and eating ? N  Using the Toilet? N  In the past six months, have you accidently leaked urine? N  Do you have problems with loss of bowel control? N  Managing your Medications? N  Managing your Finances? N  Housekeeping or managing your Housekeeping? N    Patient Care Team: Kennyth Worth HERO, MD as PCP - General (Family Medicine) Loni Soyla LABOR, MD as PCP - Cardiology (Cardiology) Kelsie Agent, MD (Inactive) as PCP - Electrophysiology (Cardiology) Kelsie Agent, MD (Inactive) as Consulting Physician (Cardiology) Rosemarie Eather RAMAN, MD as Consulting Physician (Neurology)  I have updated your Care Teams any recent Medical Services you may have received from other providers in the past year.     Assessment:   This is a routine wellness examination for Apostolos.  Hearing/Vision screen Hearing Screening - Comments:: Pt denies any hearing issues  Vision Screening - Comments:: Wears rx glasses - up to date with routine eye exams with Dr Adine Waylan [facility] ophthal[facility]    Goals Addressed             This Visit's Progress    Patient Stated       Maintain health and activity        Depression Screen     07/11/2024    8:28 AM 06/13/2024   11:47 AM 09/10/2023    8:45 AM 07/06/2023   10:08 AM 01/13/2023    9:27 AM 12/08/2022   10:57 AM 11/04/2022   11:46 AM  PHQ 2/9 Scores  PHQ - 2 Score 0 0 0 0 0 0 0    Fall Risk     07/11/2024    8:30 AM 06/13/2024   11:47 AM 09/10/2023    8:45 AM 06/30/2023    3:50 PM 01/13/2023    9:27 AM  Fall Risk   Falls in the past year? 0 0 0 0 0  Number falls in past yr: 0 0 0 0   Injury with  Fall? 0 0 0 0 0   Risk for fall due to : No Fall Risks No Fall Risks No Fall Risks Impaired vision No Fall Risks  Follow up Falls prevention discussed   Falls prevention discussed     MEDICARE RISK AT HOME:  Medicare Risk at Home Any stairs in or around the home?: Yes If so, are there any without handrails?: No Home free of loose throw rugs in walkways, pet beds, electrical cords, etc?: Yes Adequate lighting in your home to reduce risk of falls?: Yes Life alert?: No Use of a cane, walker or w/c?: No Grab bars in the bathroom?: No Shower chair or bench in shower?: Yes Elevated toilet seat or a handicapped toilet?: No  TIMED UP AND GO:  Was the test performed?  No  Cognitive Function: 6CIT completed        07/11/2024    8:30 AM 07/06/2023   10:10 AM 09/16/2022    9:46 AM 09/03/2021    9:39 AM  6CIT Screen  What Year? 0 points 0 points 0 points 0 points  What month? 0 points 0 points 0 points 0 points  What time? 0 points 0 points 0 points 0 points  Count back from 20 0 points 0 points 0 points 0 points  Months in reverse 0 points 0 points 0 points 0 points  Repeat phrase 0 points 0 points 0 points 0 points  Total Score 0 points 0 points 0 points 0 points    Immunizations Immunization History  Administered Date(s) Administered   Fluad Quad(high Dose 65+) 07/09/2019, 08/09/2021   Fluad Trivalent(High Dose 65+) 07/21/2023   INFLUENZA, HIGH DOSE SEASONAL PF 07/13/2017, 07/23/2018, 08/05/2022, 06/10/2024   Influenza Inj Mdck Quad Pf 07/14/2016   Influenza Split 07/27/2018   Influenza Whole 07/23/2018   Influenza, Seasonal, Injecte, Preservative Fre 08/01/2014   Influenza,inj,Quad PF,6+ Mos 08/13/2015   Influenza-Unspecified 08/09/2021   PFIZER Comirnaty(Gray Top)Covid-19 Tri-Sucrose Vaccine 08/05/2022   PFIZER(Purple Top)SARS-COV-2 Vaccination 11/19/2019, 12/10/2019, 08/07/2020   Pfizer Covid-19 Vaccine Bivalent Booster 6yrs & up 08/12/2021   Pneumococcal Conjugate-13 07/14/2016,  08/23/2018   Pneumococcal Polysaccharide-23 07/13/2016, 07/27/2018   Pneumococcal-Unspecified 10/27/2011   Respiratory Syncytial Virus Vaccine,Recomb Aduvanted(Arexvy) 08/05/2022   Tdap 01/16/2023   Zoster Recombinant(Shingrix) 08/26/2019, 11/07/2019   Zoster, Live 10/27/2011    Screening Tests Health Maintenance  Topic Date Due   COVID-19 Vaccine (6 - 2025-26 season) 06/27/2024   Medicare Annual Wellness (AWV)  07/11/2025   Colonoscopy  03/12/2026   DTaP/Tdap/Td (2 - Td or Tdap) 01/15/2033   Pneumococcal Vaccine: 50+ Years  Completed   Influenza Vaccine  Completed   Hepatitis C Screening  Completed   Zoster Vaccines- Shingrix  Completed   HPV VACCINES  Aged Out   Meningococcal B Vaccine  Aged Out    Health Maintenance Items Addressed: See Nurse Notes at the end of this note  Additional Screening:  Vision Screening: Recommended annual ophthalmology exams for early detection of glaucoma and other disorders of the eye. Is the patient up to date with their annual eye exam?  Yes  Who is the provider or what is the name of the office in which the patient attends annual eye exams? Dr Adine Haddock   Dental Screening: Recommended annual dental exams for proper oral hygiene  Community Resource Referral / Chronic Care Management: CRR required this visit?  No   CCM required this visit?  No   Plan:    I have personally reviewed and noted the  following in the patient's chart:   Medical and social history Use of alcohol, tobacco or illicit drugs  Current medications and supplements including opioid prescriptions. Patient is currently taking opioid prescriptions. Information provided to patient regarding non-opioid alternatives. Patient advised to discuss non-opioid treatment plan with their provider. Functional ability and status Nutritional status Physical activity Advanced directives List of other physicians Hospitalizations, surgeries, and ER visits in previous 12  months Vitals Screenings to include cognitive, depression, and falls Referrals and appointments  In addition, I have reviewed and discussed with patient certain preventive protocols, quality metrics, and best practice recommendations. A written personalized care plan for preventive services as well as general preventive health recommendations were provided to patient.   Ellouise VEAR Haws, LPN   [id number]   After Visit Summary: (MyChart) Due to this being a telephonic visit, the after visit summary with patients personalized plan was offered to patient via MyChart   Notes: Nothing significant to report at this time.

## 2024-07-11 NOTE — Patient Instructions (Signed)
 Mr. Rosenwald,  Thank you for taking the time for your Medicare Wellness Visit. I appreciate your continued commitment to your health goals. Please review the care plan we discussed, and feel free to reach out if I can assist you further.  Medicare recommends these wellness visits once per year to help you and your care team stay ahead of potential health issues. These visits are designed to focus on prevention, allowing your provider to concentrate on managing your acute and chronic conditions during your regular appointments.  Please note that Annual Wellness Visits do not include a physical exam. Some assessments may be limited, especially if the visit was conducted virtually. If needed, we may recommend a separate in-person follow-up with your provider.  Ongoing Care Seeing your primary care provider every 3 to 6 months helps us  monitor your health and provide consistent, personalized care.   Referrals If a referral was made during today's visit and you haven't received any updates within two weeks, please contact the referred provider directly to check on the status.  Recommended Screenings:  Health Maintenance  Topic Date Due   COVID-19 Vaccine (6 - 2025-26 season) 06/27/2024   Medicare Annual Wellness Visit  09/09/2024   Colon Cancer Screening  03/12/2026   DTaP/Tdap/Td vaccine (2 - Td or Tdap) 01/15/2033   Pneumococcal Vaccine for age over 72  Completed   Flu Shot  Completed   Hepatitis C Screening  Completed   Zoster (Shingles) Vaccine  Completed   HPV Vaccine  Aged Out   Meningitis B Vaccine  Aged Out       01/31/2024   12:51 PM  Advanced Directives  Does Patient Have a Medical Advance Directive? No  Would patient like information on creating a medical advance directive? No - Patient declined   Advance Care Planning is important because it: Ensures you receive medical care that aligns with your values, goals, and preferences. Provides guidance to your family and loved  ones, reducing the emotional burden of decision-making during critical moments.  Vision: Annual vision screenings are recommended for early detection of glaucoma, cataracts, and diabetic retinopathy. These exams can also reveal signs of chronic conditions such as diabetes and high blood pressure.  Dental: Annual dental screenings help detect early signs of oral cancer, gum disease, and other conditions linked to overall health, including heart disease and diabetes.  Please see the attached documents for additional preventive care recommendations.

## 2024-07-21 ENCOUNTER — Telehealth (HOSPITAL_COMMUNITY): Payer: Self-pay

## 2024-07-21 NOTE — Telephone Encounter (Signed)
 Spoke to William Underwood at the clinic, pt will call us  back to schedule when he is ready to proceed. AB

## 2024-08-12 ENCOUNTER — Ambulatory Visit (HOSPITAL_COMMUNITY)
Admission: RE | Admit: 2024-08-12 | Discharge: 2024-08-12 | Disposition: A | Discharge status: Home / Self Care | Source: Ambulatory Visit | Attending: Internal Medicine | Admitting: Internal Medicine

## 2024-08-12 DIAGNOSIS — I480 Paroxysmal atrial fibrillation: Secondary | ICD-10-CM | POA: Diagnosis not present

## 2024-08-12 DIAGNOSIS — I340 Nonrheumatic mitral (valve) insufficiency: Secondary | ICD-10-CM | POA: Diagnosis not present

## 2024-08-12 LAB — ECHOCARDIOGRAM COMPLETE
Area-P 1/2: 3.93 cm2
S' Lateral: 2.85 cm

## 2024-08-13 ENCOUNTER — Ambulatory Visit: Payer: Self-pay | Admitting: Internal Medicine

## 2024-08-15 ENCOUNTER — Telehealth: Payer: Self-pay

## 2024-08-15 NOTE — Telephone Encounter (Signed)
 TruPlan 1:    TruPlan2:

## 2024-08-17 ENCOUNTER — Institutional Professional Consult (permissible substitution): Admitting: Cardiology

## 2024-08-22 NOTE — Progress Notes (Signed)
 Electrophysiology Office Note:   Date:  08/28/2024  ID:  William Underwood, DOB [birth year], MRN [id number]  Primary Cardiologist: William DELENA Merck, MD Electrophysiologist: William Kitty, MD      History of Present Illness:   William Underwood is a 73 y.o. male with h/o paroxysmal atrial fibrillation s/p PVI in 2019 c/b PV stenosis s/p stenting of LLPV, CVA in 2021 who is being seen today for Watchman evaluation at the request of Dr. Merck.  Discussed the use of AI scribe software for clinical note transcription with the patient, who gave verbal consent to proceed.  History of Present Illness William Underwood is a 73 year old male with atrial fibrillation and a history of stroke who presents for evaluation of the Watchman procedure. He was referred by Dr. Merck for evaluation of the Watchman procedure.  He has a history of atrial fibrillation diagnosed in 2018 following unusual symptoms after a cross-country cycling trip. As an avid runner and cyclist, he engaged in high levels of physical activity, including over 100 marathons and a cross-country cycling trip. Initially managed by Dr. Kelsie, his atrial fibrillation was associated with a slow heart rate and an enlarged heart. A radiofrequency ablation was performed in July 2018, which initially seemed successful, but he soon experienced pulmonary vein damage, leading to further complications.  Following the ablation, a CT scan revealed pulmonary vein stenosis, with one vein completely closed and another significantly narrowed. He was referred to Dr. Woodard at the [facility], where two stents were placed. However, one stent failed to remain open, resulting in the loss of that vein. Despite these interventions, he experienced significant bleeding and complications.  In 2019, after being off blood thinners for a few months, he suffered a stroke, resulting in leg paralysis and double vision. After six months of therapy, he regained  full function, including his vision. Since then, he has not experienced any detectable atrial fibrillation, as confirmed by regular monitoring with a smartwatch. He is currently on blood thinners but is interested in the Watchman procedure to potentially discontinue them. He continues to run and cycle but reports limitations due to his pulmonary vein issues. He is concerned about the progression of pulmonary vein stenosis and its impact on his physical capabilities.  He has a history of transient ischemic attacks prior to his stroke, which he describes as warning signs. He is diligent about wearing his smartwatch to monitor for atrial fibrillation, which has consistently shown no episodes.    Review of systems complete and found to be negative unless listed in HPI.   EP Information / Studies Reviewed:    EKG is not ordered today. EKG from 06/29/24 reviewed which showed SR.     ECG 02/22/2018: AF   Echo 08/12/24: 1. Left ventricular ejection fraction, by estimation, is 65 to 70%. The  left ventricle has normal function. The left ventricle has no regional  wall motion abnormalities. Left ventricular diastolic parameters were  normal. The average left ventricular  global longitudinal strain is -18.6 %. The global longitudinal strain is  normal.   2. Right ventricular systolic function is normal. The right ventricular  size is normal.   3. Trivial intermittent prolapse of posterior mitral leaflet. Mild mitral  valve regurgitation.   4. The aortic valve is tricuspid. Aortic valve regurgitation is mild.  Aortic valve sclerosis is present, with no evidence of aortic valve  stenosis.   5. Pulmonic valve regurgitation is moderate.   6. The inferior  vena cava is normal in size with greater than 50%  respiratory variability, suggesting right atrial pressure of 3 mmHg.   Risk Assessment/Calculations:    CHA2DS2-VASc Score = 3   This indicates a 3.2% annual risk of stroke. The patient's score  is based upon: CHF History: 0 HTN History: 0 Diabetes History: 0 Stroke History: 2 Vascular Disease History: 0 Age Score: 1 Gender Score: 0         Physical Exam:   VS:  BP (!) 140/82 (BP Location: Right Arm, Patient Position: Sitting, Cuff Size: Normal)   Pulse 68   Ht 5' 7 (1.702 m)   Wt 159 lb (72.1 kg)   SpO2 97%   BMI 24.90 kg/m    Wt Readings from Last 3 Encounters:  08/23/24 159 lb (72.1 kg)  07/11/24 155 lb (70.3 kg)  06/29/24 158 lb 6.4 oz (71.8 kg)     GEN: Well nourished, well developed in no acute distress NECK: No JVD CARDIAC: Normal rate, regular rhythm RESPIRATORY:  Clear to auscultation without rales, wheezing or rhonchi  ABDOMEN: Soft, non-distended EXTREMITIES:  No edema; No deformity   ASSESSMENT AND PLAN:   I have seen William Underwood in the office today who is being considered for a Watchman left atrial appendage closure device. I believe they will benefit from this procedure given their history of atrial fibrillation, CHA2DS2-VASc score of 3 and unadjusted ischemic stroke rate of 3.2% per year. Unfortunately, the patient is not felt to be a long term anticoagulation candidate secondary to bleeding, high risk activities. The patient's chart has been reviewed and I feel that they would be a candidate for short term oral anticoagulation after Watchman implant.   It is my belief that after undergoing a LAA closure procedure, William Underwood will not need long term anticoagulation which eliminates anticoagulation side effects and major bleeding risk.   Procedural risks for the Watchman implant have been reviewed with the patient including a 0.5% risk of stroke, <1% risk of perforation and <1% risk of device embolization. Other risks include bleeding, vascular damage, tamponade, worsening renal function, and death. The patient understands these risk and wishes to proceed.     The published clinical data on the safety and effectiveness of WATCHMAN include but  are not limited to the following: - Holmes DR, Jess BEARD, Sick P et al. for the PROTECT AF Investigators. Percutaneous closure of the left atrial appendage versus warfarin therapy for prevention of stroke in patients with atrial fibrillation: a randomised non-inferiority trial. Lancet 2009; 374: 534-42. GLENWOOD Jess BEARD, Doshi SK, Jonita VEAR Satchel D et al. on behalf of the PROTECT AF Investigators. Percutaneous Left Atrial Appendage Closure for Stroke Prophylaxis in Patients With Atrial Fibrillation 2.3-Year Follow-up of the PROTECT AF (Watchman Left Atrial Appendage System for Embolic Protection in Patients With Atrial Fibrillation) Trial. Circulation 2013; 127:720-729. - Alli O, Doshi S,  Kar S, Reddy VY, Sievert H et al. Quality of Life Assessment in the Randomized PROTECT AF (Percutaneous Closure of the Left Atrial Appendage Versus Warfarin Therapy for Prevention of Stroke in Patients With Atrial Fibrillation) Trial of Patients at Risk for Stroke With Nonvalvular Atrial Fibrillation. J Am Coll Cardiol 2013; 61:1790-8. GLENWOOD Satchel DR, Archer RAMAN, Price M, Whisenant B, Sievert H, Doshi S, Huber K, Reddy V. Prospective randomized evaluation of the Watchman left atrial appendage Device in patients with atrial fibrillation versus long-term warfarin therapy; the PREVAIL trial. Journal of the [employer] of Cardiology, Vol. 4,  No. 1, 2014, 1-11. - Kar S, Doshi SK, Sadhu A, Horton R, Osorio J et al. Primary outcome evaluation of a next-generation left atrial appendage closure device: results from the PINNACLE FLX trial. Circulation 2021;143(18)1754-1762.    HAS-BLED score 2 Hypertension No  Abnormal renal and liver function (Dialysis, transplant, Cr >2.26 mg/dL /Cirrhosis or Bilirubin >2x Normal or AST/ALT/AP >3x Normal) No  Stroke Yes  Bleeding No  Labile INR (Unstable/high INR) No  Elderly (>65) Yes  Drugs or alcohol (>= 8 drinks/week, anti-plt or NSAID) No   CHA2DS2-VASc Score = 3  The patient's score is  based upon: CHF History: 0 HTN History: 0 Diabetes History: 0 Stroke History: 2 Vascular Disease History: 0 Age Score: 1 Gender Score: 0     Assessment & Plan Atrial Fibrillation (AFib) Diagnosed in 2018, likely related to long-term endurance exercise. Underwent successful radiofrequency ablation by Dr. Kelsie, resulting in no detectable AFib for several years. Currently asymptomatic for AFib, confirmed by continuous monitoring with a smartwatch. The ablation appears durable, but recurrence from other triggers remains possible. - Continue monitoring for AFib using smartwatch - Discuss with neurology the potential to stop Eliquis  if AFib remains undetectable  Pulmonary Vein Stenosis Developed post-ablation, with significant narrowing and closure of some pulmonary veins. Underwent stenting at the [facility], but one vein re-occluded. Concerns about progressive stenosis and potential pulmonary hypertension due to increased pressures. Current echocardiogram shows no significant right heart strain, indicating no severe pulmonary hypertension at present. - Order CT scan to assess current status of pulmonary vein stents and left atrial appendage anatomy - Consult with Dr. Woodard regarding long-term management of pulmonary vein stenting if CT shows changes  Stroke Suffered a stroke in 2019 after discontinuation of anticoagulation, despite no detectable AFib. The etiology remains uncertain, with possibilities including undetected AFib or thrombus formation near pulmonary vein stents. Fully recovered after rehabilitation. Neurology input is crucial to evaluate the risk of stopping Eliquis  and the potential benefit of a Watchman device. - Consult with neurology to evaluate the risk of stopping Eliquis  and the potential benefit of a Watchman device  Consideration for Watchman Device Interested in a Watchman device to discontinue blood thinners due to lifestyle limitations. The benefit is  uncertain due to the absence of AFib at time of the previous stroke. The decision is contingent on neurology's assessment of stroke risk without anticoagulation. If neurology approves, the procedure involves placing the device in the left atrial appendage, followed by a transition from Eliquis  to aspirin and Plavix, and eventually to baby aspirin indefinitely. - Proceed with CT scan to evaluate suitability for Watchman device - Discuss with neurology the risk of stroke without Eliquis  - Consider Watchman device if neurology approves discontinuation of Eliquis   Follow-up Necessary to reassess the condition after obtaining further diagnostic information and specialist consultations. - Schedule follow-up appointment after CT scan and neurology consultation - Reassess treatment plan based on new information   Follow up with Dr. Kennyth in 8 weeks.    Signed, William Kennyth, MD

## 2024-08-23 ENCOUNTER — Encounter: Payer: Self-pay | Admitting: Cardiology

## 2024-08-23 ENCOUNTER — Ambulatory Visit: Attending: Cardiology | Admitting: Cardiology

## 2024-08-23 VITALS — BP 140/82 | HR 68 | Ht 67.0 in | Wt 159.0 lb

## 2024-08-23 DIAGNOSIS — D6869 Other thrombophilia: Secondary | ICD-10-CM

## 2024-08-23 DIAGNOSIS — Z8673 Personal history of transient ischemic attack (TIA), and cerebral infarction without residual deficits: Secondary | ICD-10-CM

## 2024-08-23 DIAGNOSIS — I288 Other diseases of pulmonary vessels: Secondary | ICD-10-CM

## 2024-08-23 DIAGNOSIS — I480 Paroxysmal atrial fibrillation: Secondary | ICD-10-CM

## 2024-08-23 NOTE — Patient Instructions (Signed)
 Medication Instructions:  Your physician recommends that you continue on your current medications as directed. Please refer to the Current Medication list given to you today.  *If you need a refill on your cardiac medications before your next appointment, please call your pharmacy*  Testing/Procedures: Cardiac CT  Your physician has requested that you have cardiac CT. Cardiac computed tomography (CT) is a painless test that uses an x-ray machine to take clear, detailed pictures of your heart. For further information please visit [email or web address] Please follow instruction sheet as given.   Follow-Up: At [facility], you and your health needs are our priority.  As part of our continuing mission to provide you with exceptional heart care, our providers are all part of one team.  This team includes your primary Cardiologist (physician) and Advanced Practice Providers or APPs (Physician Assistants and Nurse Practitioners) who all work together to provide you with the care you need, when you need it.  Your next appointment:   8-10 weeks  Provider:   Fonda Kitty, MD    Other Instructions You have been referred to see a neurologist

## 2024-08-29 ENCOUNTER — Telehealth: Payer: Self-pay

## 2024-08-29 NOTE — Telephone Encounter (Signed)
 Spoke with patient. Arranged pre-watchman CT 11/28. Patient will receive instructions via MyChart. He has appt with PCP 11/17 which he anticipates blood work will be drawn. Will send a message to PCP asking that BMET be drawn for CT scan.   Advised patient to call back if he doesn't hear back from Neurology to schedule an appt by next week.  He was grateful for the call.

## 2024-09-04 ENCOUNTER — Other Ambulatory Visit: Payer: Self-pay | Admitting: Family Medicine

## 2024-09-04 DIAGNOSIS — E785 Hyperlipidemia, unspecified: Secondary | ICD-10-CM

## 2024-09-05 NOTE — Telephone Encounter (Signed)
 Called Neurology to inquire if they had received referral. They stated they did and tried to call patient 11/4 and left a message. Will send patient a MyChart asking for him to follow up with Neurology.

## 2024-09-07 ENCOUNTER — Encounter: Payer: Self-pay | Admitting: Family Medicine

## 2024-09-12 ENCOUNTER — Ambulatory Visit (INDEPENDENT_AMBULATORY_CARE_PROVIDER_SITE_OTHER): Payer: PPO | Admitting: Family Medicine

## 2024-09-12 ENCOUNTER — Encounter: Payer: Self-pay | Admitting: Family Medicine

## 2024-09-12 VITALS — BP 120/80 | HR 52 | Temp 98.1°F | Ht 67.0 in | Wt 163.2 lb

## 2024-09-12 DIAGNOSIS — I288 Other diseases of pulmonary vessels: Secondary | ICD-10-CM

## 2024-09-12 DIAGNOSIS — N400 Enlarged prostate without lower urinary tract symptoms: Secondary | ICD-10-CM | POA: Diagnosis not present

## 2024-09-12 DIAGNOSIS — R739 Hyperglycemia, unspecified: Secondary | ICD-10-CM

## 2024-09-12 DIAGNOSIS — H9319 Tinnitus, unspecified ear: Secondary | ICD-10-CM

## 2024-09-12 DIAGNOSIS — I480 Paroxysmal atrial fibrillation: Secondary | ICD-10-CM

## 2024-09-12 DIAGNOSIS — Z0001 Encounter for general adult medical examination with abnormal findings: Secondary | ICD-10-CM | POA: Diagnosis not present

## 2024-09-12 DIAGNOSIS — G629 Polyneuropathy, unspecified: Secondary | ICD-10-CM | POA: Diagnosis not present

## 2024-09-12 DIAGNOSIS — E785 Hyperlipidemia, unspecified: Secondary | ICD-10-CM

## 2024-09-12 LAB — COMPREHENSIVE METABOLIC PANEL WITH GFR
ALT: 27 U/L (ref 0–53)
AST: 27 U/L (ref 0–37)
Albumin: 4.1 g/dL (ref 3.5–5.2)
Alkaline Phosphatase: 53 U/L (ref 39–117)
BUN: 15 mg/dL (ref 6–23)
CO2: 24 meq/L (ref 19–32)
Calcium: 8.9 mg/dL (ref 8.4–10.5)
Chloride: 105 meq/L (ref 96–112)
Creatinine, Ser: 0.75 mg/dL (ref 0.40–1.50)
GFR: 89.29 mL/min (ref >60.00)
Glucose, Bld: 102 mg/dL — ABNORMAL HIGH (ref 70–99)
Potassium: 4.2 meq/L (ref 3.5–5.1)
Sodium: 139 meq/L (ref 135–145)
Total Bilirubin: 0.7 mg/dL (ref 0.2–1.2)
Total Protein: 6.5 g/dL (ref 6.0–8.3)

## 2024-09-12 LAB — LIPID PANEL
Cholesterol: 110 mg/dL (ref 0–200)
HDL: 52.6 mg/dL (ref >39.00)
LDL Cholesterol: 47 mg/dL (ref 0–99)
NonHDL: 57.64
Total CHOL/HDL Ratio: 2
Triglycerides: 52 mg/dL (ref 0.0–149.0)
VLDL: 10.4 mg/dL (ref 0.0–40.0)

## 2024-09-12 LAB — VITAMIN B12: Vitamin B-12: 277 pg/mL (ref 211–911)

## 2024-09-12 LAB — TSH: TSH: 1.26 u[IU]/mL (ref 0.35–5.50)

## 2024-09-12 NOTE — Patient Instructions (Addendum)
 It was very nice to see you today!  VISIT SUMMARY: You had your annual physical exam today. We discussed your history of atrial fibrillation, stroke, and other health concerns. Blood work and a physical examination were performed. We reviewed your current medications and discussed potential future treatments.  YOUR PLAN: PAROXYSMAL ATRIAL FIBRILLATION: You have a history of atrial fibrillation but have not had any episodes in four years. You are currently taking Eliquis . -Continue taking Eliquis  until further evaluation. -A neurologist consultation is pending to determine the cause of your stroke. -Consider a Watchman device if atrial fibrillation is confirmed as the cause of your stroke.  PULMONARY VEIN STENOSIS WITH STENTS: You have stents in your pulmonary veins and are experiencing increased shortness of breath. -A CT scan is planned to assess the patency of your stents. -Monitor for symptoms of pulmonary hypertension.  BENIGN PROSTATIC HYPERPLASIA (BPH): You have symptoms suggestive of BPH and are considering a PAE procedure. -Proceed with the PAE procedure as recommended by your urologist.  CARPAL TUNNEL SYNDROME AND CUBITAL TUNNEL SYNDROME: You have symptoms of carpal tunnel and cubital tunnel syndrome, likely exacerbated by cycling. -Use carpal tunnel splints during sleep. -Provided handouts for stretches and exercises. -Consider referral to sports medicine for cortisone injections if symptoms persist.  PERIPHERAL NEUROPATHY: You have numbness and tingling in your feet and hands, likely related to cycling. -B12 levels have been checked. -Consult with a neurologist for further evaluation.  PULMONARY VALVE REGURGITATION: You have moderate pulmonary valve regurgitation. -Continue monitoring with echocardiograms.  HYPERLIPIDEMIA: Routine monitoring of cholesterol levels. -Cholesterol levels have been checked.  TINNITUS: You have worsening tinnitus. -Continue using diazepam  as  needed for severe episodes.  CATARACTS: You have developing cataracts that do not currently require surgery. -Continue regular monitoring by your ophthalmologist.  Return in about 1 year (around 09/12/2025) for Annual Physical.   Take care, Dr Kennyth  PLEASE NOTE:  If you had any lab tests, please let us  know if you have not heard back within a few days. You may see your results on mychart before we have a chance to review them but we will give you a call once they are reviewed by us .   If we ordered any referrals today, please let us  know if you have not heard from their office within the next week.   If you had any urgent prescriptions sent in today, please check with the pharmacy within an hour of our visit to make sure the prescription was transmitted appropriately.   Please try these tips to maintain a healthy lifestyle:  Eat at least 3 REAL meals and 1-2 snacks per day.  Aim for no more than 5 hours between eating.  If you eat breakfast, please do so within one hour of getting up.   Each meal should contain half fruits/vegetables, one quarter protein, and one quarter carbs (no bigger than a computer mouse)  Cut down on sweet beverages. This includes juice, soda, and sweet tea.   Drink at least 1 glass of water with each meal and aim for at least 8 glasses per day  Exercise at least 150 minutes every week.    Preventive Care 39 Years and Older, Male Preventive care refers to lifestyle choices and visits with your health care provider that can promote health and wellness. Preventive care visits are also called wellness exams. What can I expect for my preventive care visit? Counseling During your preventive care visit, your health care provider may ask about your: Medical  history, including: Past medical problems. Family medical history. History of falls. Current health, including: Emotional well-being. Home life and relationship well-being. Sexual activity. Memory and  ability to understand (cognition). Lifestyle, including: Alcohol, nicotine or tobacco, and drug use. Access to firearms. Diet, exercise, and sleep habits. Work and work astronomer. Sunscreen use. Safety issues such as seatbelt and bike helmet use. Physical exam Your health care provider will check your: Height and weight. These may be used to calculate your BMI (body mass index). BMI is a measurement that tells if you are at a healthy weight. Waist circumference. This measures the distance around your waistline. This measurement also tells if you are at a healthy weight and may help predict your risk of certain diseases, such as type 2 diabetes and high blood pressure. Heart rate and blood pressure. Body temperature. Skin for abnormal spots. What immunizations do I need?  Vaccines are usually given at various ages, according to a schedule. Your health care provider will recommend vaccines for you based on your age, medical history, and lifestyle or other factors, such as travel or where you work. What tests do I need? Screening Your health care provider may recommend screening tests for certain conditions. This may include: Lipid and cholesterol levels. Diabetes screening. This is done by checking your blood sugar (glucose) after you have not eaten for a while (fasting). Hepatitis C test. Hepatitis B test. HIV (human immunodeficiency virus) test. STI (sexually transmitted infection) testing, if you are at risk. Lung cancer screening. Colorectal cancer screening. Prostate cancer screening. Abdominal aortic aneurysm (AAA) screening. You may need this if you are a current or former smoker. Talk with your health care provider about your test results, treatment options, and if necessary, the need for more tests. Follow these instructions at home: Eating and drinking  Eat a diet that includes fresh fruits and vegetables, whole grains, lean protein, and low-fat dairy products. Limit your  intake of foods with high amounts of sugar, saturated fats, and salt. Take vitamin and mineral supplements as recommended by your health care provider. Do not drink alcohol if your health care provider tells you not to drink. If you drink alcohol: Limit how much you have to 0-2 drinks a day. Know how much alcohol is in your drink. In the [place] one drink equals one 12 oz bottle of beer (355 mL), one 5 oz glass of wine (148 mL), or one 1 oz glass of hard liquor (44 mL). Lifestyle Brush your teeth every morning and night with fluoride toothpaste. Floss one time each day. Exercise for at least 30 minutes 5 or more days each week. Do not use any products that contain nicotine or tobacco. These products include cigarettes, chewing tobacco, and vaping devices, such as e-cigarettes. If you need help quitting, ask your health care provider. Do not use drugs. If you are sexually active, practice safe sex. Use a condom or other form of protection to prevent STIs. Take aspirin only as told by your health care provider. Make sure that you understand how much to take and what form to take. Work with your health care provider to find out whether it is safe and beneficial for you to take aspirin daily. Ask your health care provider if you need to take a cholesterol-lowering medicine (statin). Find healthy ways to manage stress, such as: Meditation, yoga, or listening to music. Journaling. Talking to a trusted person. Spending time with friends and family. Safety Always wear your seat belt while driving or  riding in a vehicle. Do not drive: If you have been drinking alcohol. Do not ride with someone who has been drinking. When you are tired or distracted. While texting. If you have been using any mind-altering substances or drugs. Wear a helmet and other protective equipment during sports activities. If you have firearms in your house, make sure you follow all gun safety procedures. Minimize exposure to  UV radiation to reduce your risk of skin cancer. What's next? Visit your health care provider once a year for an annual wellness visit. Ask your health care provider how often you should have your eyes and teeth checked. Stay up to date on all vaccines. This information is not intended to replace advice given to you by your health care provider. Make sure you discuss any questions you have with your health care provider. Document Revised: 04/10/2021 Document Reviewed: 04/10/2021 Elsevier Patient Education  2024 [employer].

## 2024-09-12 NOTE — Assessment & Plan Note (Signed)
 Follows with urology.  On Flomax  0.4 mg daily.  He believes this is the major source of his intermittent abdominal pain with saw him for a couple of months ago.  They are currently in discussion of pursuing ablation for his BPH.

## 2024-09-12 NOTE — Assessment & Plan Note (Addendum)
Check lipids.  He is on Lipitor 80 mg daily. 

## 2024-09-12 NOTE — Assessment & Plan Note (Signed)
 Stable on Valium  as needed.  Uses this very sporadically.  Does not need refill today.

## 2024-09-12 NOTE — Progress Notes (Signed)
 Chief Complaint:  William Underwood is a 73 y.o. male who presents today for his annual comprehensive physical exam.    Assessment/Plan:  New/Acute Problems: Neuropathy  Patient with neuropathic type symptoms in arms and legs intermittently for months to years. Exam today is consistent with carpal tunnel syndrome and cubital tunnel syndrome. Symptoms are currently manageable. We did discuss referral to sports medicine however he declined.  We gave him handouts with home exercises that he can try.  Also check labs today.  He will also discuss with neurology at upcoming appointment.  Chronic Problems Addressed Today: Paroxysmal atrial fibrillation (HCC) Regular rate and rhythm today.  Anticoagulated on Eliquis  though he is discussed with cardiology potentially being able to come off of this as they are concerned about his bleeding risk.  It was thought that his most recent stroke was probably due to A-fib however there is concern from cardiology that it may have been due to the stenosis of his pulmonary artery stents.  They have referred him to neurology for stroke risk evaluation prior to discontinuing his Eliquis .  He may be candidate for Watchman if it was thought that his most recent stroke was due to A-fib.  Pulmonary vein stenosis s/p stenting 2020 Following with cardiology.  Dyslipidemia Check lipids.  He is on Lipitor  80 mg daily.  BPH (benign prostatic hyperplasia) Follows with urology.  On Flomax  0.4 mg daily.  He believes this is the major source of his intermittent abdominal pain with saw him for a couple of months ago.  They are currently in discussion of pursuing ablation for his BPH.  Tinnitus Stable on Valium  as needed.  Uses this very sporadically.  Does not need refill today.  Preventative Healthcare: Check labs.  Up-to-date on vaccines.  Patient Counseling(The following topics were reviewed and/or handout was given):  -Nutrition: Stressed importance of moderation in  sodium/caffeine intake, saturated fat and cholesterol, caloric balance, sufficient intake of fresh fruits, vegetables, and fiber.  -Stressed the importance of regular exercise.   -Substance Abuse: Discussed cessation/primary prevention of tobacco, alcohol, or other drug use; driving or other dangerous activities under the influence; availability of treatment for abuse.   -Injury prevention: Discussed safety belts, safety helmets, smoke detector, smoking near bedding or upholstery.   -Sexuality: Discussed sexually transmitted diseases, partner selection, use of condoms, avoidance of unintended pregnancy and contraceptive alternatives.   -Dental health: Discussed importance of regular tooth brushing, flossing, and dental visits.  -Health maintenance and immunizations reviewed. Please refer to Health maintenance section.  Return to care in 1 year for next preventative visit.     Subjective:  HPI:  He has no acute complaints today. Patient is here today for his annual physical.  See assessment / plan for status of chronic conditions.  Discussed the use of AI scribe software for clinical note transcription with the patient, who gave verbal consent to proceed.  History of Present Illness William Underwood is a 73 year old male with a history of atrial fibrillation and stroke who presents for an annual physical exam.  He has a history of atrial fibrillation and stroke, with no episodes of atrial fibrillation since his stroke four years ago. He had stents placed in his pulmonary veins two years prior to the stroke. He is currently on Eliquis  and is concerned about the risks associated with it, especially given his active lifestyle involving cycling. He has discussed the possibility of discontinuing Eliquis  and the potential for a Watchman device,  pending further evaluation by a neurologist and consultation with the doctor who placed his stents.  He experiences increased shortness of breath during  cycling and is concerned about the patency of his pulmonary veins. He is concerned about the patency of his pulmonary veins and mentions a history of moderate leakage in his pulmonary valve. He notes that one of the stents closed off shortly after placement, but the other remains patent.  He has a history of prostate issues and reports that his abdominal pain, which was previously concerning, has improved. He believes the pain is related to his prostate and is considering a PAE procedure. He is cautious about the number of CT scans he has undergone in recent years due to concerns about radiation exposure.  He experiences neuropathy in his feet and hands, which he associates with his cycling activities. He describes numbness and tingling, particularly after long rides, and notes that his father also had neuropathy. He is awaiting an appointment with a neurologist to further evaluate these symptoms.  He reports tinnitus, which he manages occasionally with diazepam  when it becomes severe. He also mentions developing cataracts, though they are not yet severe enough to require surgery.       09/12/2024    9:09 AM  Depression screen PHQ 2/9  Decreased Interest 0  Down, Depressed, Hopeless 0  PHQ - 2 Score 0    There are no preventive care reminders to display for this patient.   ROS: Per HPI, otherwise a complete review of systems was negative.   PMH:  The following were reviewed and entered/updated in epic: Past Medical History:  Diagnosis Date   Benign prostatic hyperplasia without urinary obstruction 06/17/2014   BPPV (benign paroxysmal positional vertigo) 07/01/2011   Chronic hyperkalemia 05/20/2012   Overview:  Hx of for years   Concussion syndrome 05/15/2011   This seems like a moderately severe concussion with at least 1 minute loss of consciousness in 15-20 minutes of significant confusion including being unable to state his name    DEGENERATIVE JOINT DISEASE, HANDS 10/16/2009    Qualifier: Diagnosis of  By: HARVEY MD, [name]     Diverticulosis 06/20/2017   Hemorrhoids, external 06/20/2017   Lumbar spondylosis 06/20/2017   Fairly significant L4-L5 facet arthropathy with effusion.  Can consider facet injections and potentially RFA down the road.   NEUROPATHY 03/01/2008   Qualifier: Diagnosis of  By: HARVEY MD, [name]     Paroxysmal atrial fibrillation (HCC)    Pulmonary insufficiency 03/09/2018   moderate PI noted on echo 03/09/18   Pulmonary vein stenosis (HCC)    left inferior pulmonary vein stenosis   Right inguinal hernia 06/17/2014   Sinus bradycardia    Strain of gluteus medius 01/29/2017   Stroke (HCC) 05/2020   Vertigo    Patient Active Problem List   Diagnosis Date Noted   BPH (benign prostatic hyperplasia) 06/13/2024   Insomnia 09/10/2023   Nephrolithiasis 09/10/2023   Sleep-disordered breathing 12/08/2022   History of stroke    Allergic rhinitis 02/24/2022   Dyslipidemia 09/02/2021   Aortic atherosclerosis 06/13/2021   Stroke (HCC) 06/22/2020   Arthralgia 07/14/2019   Pulmonary vein stenosis s/p stenting 2020 01/02/2019   Chronic neck pain 08/23/2018   Tinnitus 08/23/2018   Lipoma 08/23/2018   Paroxysmal atrial fibrillation (HCC) 05/25/2018   Diverticulosis 06/20/2017   Hemorrhoids, external 06/20/2017   Urinary hesitancy 06/17/2014   BPPV (benign paroxysmal positional vertigo) 07/01/2011   Osteoarthritis 10/16/2009   NEUROPATHY 03/01/2008  Past Surgical History:  Procedure Laterality Date   ANKLE ARTHROSCOPY     ATRIAL FIBRILLATION ABLATION N/A 05/25/2018   Procedure: ATRIAL FIBRILLATION ABLATION;  Surgeon: Kelsie Agent, MD;  Location: [facility] INVASIVE CV LAB;  Service: Cardiovascular;  Laterality: N/A;   BUBBLE STUDY  03/25/2022   Procedure: BUBBLE STUDY;  Surgeon: Loni Soyla LABOR, MD;  Location[facility] ENDOSCOPY;  Service: Cardiology;;   IR RADIOLOGIST EVAL & MGMT  04/27/2024   KNEE ARTHROSCOPY Left 11/2021   ROTATOR CUFF REPAIR Left  06/24/2021   TEE WITHOUT CARDIOVERSION N/A 03/25/2022   Procedure: TRANSESOPHAGEAL ECHOCARDIOGRAM (TEE);  Surgeon: Loni Soyla LABOR, MD;  Location: [facility] ENDOSCOPY;  Service: Cardiology;  Laterality: N/A;    Family History  Problem Relation Age of Onset   Hearing loss Father    Colon cancer Neg Hx     Medications- reviewed and updated Current Outpatient Medications  Medication Sig Dispense Refill   apixaban  (ELIQUIS ) 5 MG TABS tablet Take 1 tablet (5 mg total) by mouth 2 (two) times daily. 180 tablet 1   atorvastatin  (LIPITOR ) 80 MG tablet TAKE 1 TABLET BY MOUTH EVERY DAY 90 tablet 3   Coenzyme Q10 100 MG capsule Take 100 mg by mouth daily.     diazepam  (VALIUM ) 5 MG tablet Take 1 tablet (5 mg total) by mouth daily as needed for anxiety. 30 tablet 1   Multiple Vitamins-Minerals (MULTIVITAMIN WITH MINERALS) tablet Take 1 tablet by mouth daily.      tamsulosin  (FLOMAX ) 0.4 MG CAPS capsule TAKE 1 CAPSULE BY MOUTH EVERY DAY 90 capsule 1   traMADol  (ULTRAM ) 50 MG tablet Take 1 tablet (50 mg total) by mouth every 6 (six) hours as needed for moderate pain (pain score 4-6). 30 tablet 5   No current facility-administered medications for this visit.    Allergies-reviewed and updated Allergies  Allergen Reactions   Cefdinir Palpitations and Rash   Codeine Other (See Comments)    Sensitive to this = agitation    Social History   Socioeconomic History   Marital status: Legally Separated    Spouse name: Not on file   Number of children: Not on file   Years of education: Not on file   Highest education level: Not on file  Occupational History   Occupation: retired  Tobacco Use   Smoking status: Never   Smokeless tobacco: Never  Vaping Use   Vaping status: Never Used  Substance and Sexual Activity   Alcohol use: Yes    Comment: Occasionally   Drug use: No   Sexual activity: Not Currently  Other Topics Concern   Not on file  Social History Narrative   Lives alone   Right Handed    Drinks 3-4 cups caffeine daily   Social Drivers of Health   Financial Resource Strain: Low Risk  (07/11/2024)   Overall Financial Resource Strain (CARDIA)    Difficulty of Paying Living Expenses: Not hard at all  Food Insecurity: No Food Insecurity (07/11/2024)   Hunger Vital Sign    Worried About Running Out of Food in the Last Year: Never true    Ran Out of Food in the Last Year: Never true  Transportation Needs: No Transportation Needs (07/11/2024)   PRAPARE - Administrator, Civil Service (Medical): No    Lack of Transportation (Non-Medical): No  Physical Activity: Sufficiently Active (07/11/2024)   Exercise Vital Sign    Days of Exercise per Week: 6 days    Minutes of Exercise  per Session: 150+ min  Stress: No Stress Concern Present (07/11/2024)   [employer] of Occupational Health - Occupational Stress Questionnaire    Feeling of Stress: Not at all  Social Connections: Moderately Isolated (07/11/2024)   Social Connection and Isolation Panel    Frequency of Communication with Friends and Family: More than three times a week    Frequency of Social Gatherings with Friends and Family: More than three times a week    Attends Religious Services: 1 to 4 times per year    Active Member of [employer] or Organizations: No    Attends Engineer, Structural: Never    Marital Status: Separated        Objective:  Physical Exam: BP 120/80   Pulse (!) 52   Temp 98.1 F (36.7 C) (Temporal)   Ht 5' 7 (1.702 m)   Wt 163 lb 3.2 oz (74 kg)   SpO2 98%   BMI 25.56 kg/m   Body mass index is 25.56 kg/m. Wt Readings from Last 3 Encounters:  09/12/24 163 lb 3.2 oz (74 kg)  08/23/24 159 lb (72.1 kg)  07/11/24 155 lb (70.3 kg)   Gen: NAD, resting comfortably HEENT: TMs normal bilaterally. OP clear. No thyromegaly noted.  CV: RRR with no murmurs appreciated Pulm: NWOB, CTAB with no crackles, wheezes, or rhonchi GI: Normal bowel sounds present. Soft, Nontender,  Nondistended. MSK: no edema, cyanosis, or clubbing noted Skin: warm, dry Neuro: CN2-12 grossly intact. Strength 5/5 in upper and lower extremities. Reflexes symmetric and intact bilaterally.  Tinel sign positive at left wrist and right medial epicondyle.  Bilateral legs with reflexes 2+ and symmetric.  Straight leg raise negative bilaterally Psych: Normal affect and thought content     Deneka Greenwalt M. Kennyth, MD 09/12/2024 9:57 AM

## 2024-09-12 NOTE — Assessment & Plan Note (Addendum)
 Regular rate and rhythm today.  Anticoagulated on Eliquis  though he is discussed with cardiology potentially being able to come off of this as they are concerned about his bleeding risk.  It was thought that his most recent stroke was probably due to A-fib however there is concern from cardiology that it may have been due to the stenosis of his pulmonary artery stents.  They have referred him to neurology for stroke risk evaluation prior to discontinuing his Eliquis .  He may be candidate for Watchman if it was thought that his most recent stroke was due to A-fib.

## 2024-09-12 NOTE — Telephone Encounter (Signed)
 Left message for patient to return call. He has yet to scheduled appt with Neurology.  Will advise this must be completed prior to OV 12/30 with Dr. Kennyth or OV will need to be pushed out.

## 2024-09-12 NOTE — Assessment & Plan Note (Signed)
 Following with cardiology.

## 2024-09-12 NOTE — Telephone Encounter (Signed)
 Patient returned call. He is going to reach out for Neurology the appt. Explained must be prior to OV with Dr. Kennyth on 12/30 otherwise will need to reschedule OV with Dr. Kennyth.  Confirmed CT is still scheduled as previously discussed. Labs were drawn today at PCP visit.  He had no further questions.

## 2024-09-13 ENCOUNTER — Encounter: Payer: Self-pay | Admitting: Internal Medicine

## 2024-09-13 LAB — CBC
HCT: 43.5 % (ref 39.0–52.0)
Hemoglobin: 14.7 g/dL (ref 13.0–17.0)
MCHC: 33.7 g/dL (ref 30.0–36.0)
MCV: 90.7 fl (ref 78.0–100.0)
Platelets: 177 K/uL (ref 150.0–400.0)
RBC: 4.8 Mil/uL (ref 4.22–5.81)
RDW: 13.4 % (ref 11.5–15.5)
WBC: 8.2 K/uL (ref 4.0–10.5)

## 2024-09-13 LAB — HEMOGLOBIN A1C: Hgb A1c MFr Bld: 5.7 % (ref 4.6–6.5)

## 2024-09-14 ENCOUNTER — Encounter: Payer: Self-pay | Admitting: Family Medicine

## 2024-09-14 NOTE — Telephone Encounter (Signed)
Please advse 

## 2024-09-15 ENCOUNTER — Ambulatory Visit: Payer: Self-pay | Admitting: Family Medicine

## 2024-09-15 MED ORDER — TRAMADOL HCL 50 MG PO TABS: 50.0000 mg | ORAL_TABLET | Freq: Four times a day (QID) | ORAL | 5 refills | Status: AC | PRN

## 2024-09-15 NOTE — Progress Notes (Signed)
 His B12 is in the lower range of normal.  This may be contributing some to his neuropathic symptoms.  Recommend he start B12 supplement 1000 mcg daily.  We should recheck his B12 in 3 to 6 months.  All of his other labs are stable and we can recheck again in a year.

## 2024-09-23 ENCOUNTER — Ambulatory Visit (HOSPITAL_COMMUNITY)
Admission: RE | Admit: 2024-09-23 | Discharge: 2024-09-23 | Disposition: A | Discharge status: Home / Self Care | Source: Ambulatory Visit | Attending: Cardiology | Admitting: Cardiology

## 2024-09-23 DIAGNOSIS — Z8673 Personal history of transient ischemic attack (TIA), and cerebral infarction without residual deficits: Secondary | ICD-10-CM | POA: Diagnosis not present

## 2024-09-23 DIAGNOSIS — I480 Paroxysmal atrial fibrillation: Secondary | ICD-10-CM | POA: Diagnosis not present

## 2024-09-23 DIAGNOSIS — D6869 Other thrombophilia: Secondary | ICD-10-CM | POA: Diagnosis present

## 2024-09-23 MED ORDER — IOHEXOL 350 MG/ML SOLN
75.0000 mL | Freq: Once | INTRAVENOUS | Status: AC | PRN
  Administered 2024-09-23: 75 mL via INTRAVENOUS

## 2024-09-30 ENCOUNTER — Telehealth: Payer: Self-pay

## 2024-09-30 NOTE — Telephone Encounter (Signed)
 William Underwood

## 2024-10-25 ENCOUNTER — Ambulatory Visit: Admitting: Cardiology

## 2024-11-17 ENCOUNTER — Ambulatory Visit: Admitting: Neurology

## 2024-11-17 ENCOUNTER — Encounter: Payer: Self-pay | Admitting: Neurology

## 2024-11-17 VITALS — BP 123/76 | HR 59 | Ht 67.0 in | Wt 167.8 lb

## 2024-11-17 DIAGNOSIS — Z8673 Personal history of transient ischemic attack (TIA), and cerebral infarction without residual deficits: Secondary | ICD-10-CM

## 2024-11-17 DIAGNOSIS — I480 Paroxysmal atrial fibrillation: Secondary | ICD-10-CM | POA: Diagnosis not present

## 2024-11-17 NOTE — Progress Notes (Signed)
 " [facility] [address] [place]. [place] [address] [phone]       OFFICE FOLLOW UP NOTE  William Underwood Date of Birth:  Jul 12, 1951 Medical Record Number:  [id number]    Primary neurologist: Dr. Rosemarie PCP: Kennyth Worth HERO, MD  Reason for Referral: Stroke   Chief Complaint  Patient presents with   RM20/CVA    Pt is here Alone. Pt states that he would like to know if his first stroke was due to Afib. Pt states that he would like to know if he should be on blood thinners.      HPI:  Update 11/17/2024 : He returns for follow-up after last visit with Harlene nearly 3 years ago.  He states he is doing well.  He has had no stroke or TIA symptoms since 2021.  Remains on Eliquis  which is tolerating well without significant bleeding and only minor bruising.  Continues to have mild balance difficulties which are not progressive and manageable.  Seen by Dr. Kennyth electrophysiologist he is considering having a Watchman device.  His blood pressure is under good control today it is 123/76.  He is tolerating Lipitor  well without any side effects.  Last lab work on 09/12/2024 showed LDL cholesterol to be optimal at 47 mg percent and hemoglobin A1c was 5.7.  He has no new complaints. Update 01/06/2022 JM: Returns for 53-month stroke follow-up visit.  Overall doing well since prior visit.  Reports great improvement of balance and speech where he currently feels he is at baseline.  He continues to be very active riding his bike long distance. He did under go knee replacement surgery a few weeks ago, plans on getting back to running on knee recovered. Denies new stroke/TIA symptoms.  Compliant on Eliquis  and atorvastatin  without side effects. Does admit to occasionally missing evening dose of Eliquis  or taking later as he will forget 6pm dose, takes 6am dose routinely.  Blood pressure today 138/80.  Routinely followed by PCP and cardiology.  No new concerns at this  time.     History provided for reference purposes only Update 07/08/2021 JM: William Underwood returns for routine stroke follow-up regarding multiple embolic strokes in 05/2020 unaccompanied. Overall stable.  Denies new stroke/TIA symptoms.  Reports improvement of balance since working with therapies but not quite at baseline. He continues to run and ride his bike. Continues to hike but with use of walking stick. Reports occasional word finding difficulty (more so compared to baseline). Denies cognitive difficulties. Double vision has since resolved around 11/2020. Compliant on Eliquis  and atorvastatin .  Blood pressure today 137/79.  Seen by cardiology 01/2021. Has f/u with PCP next month for annual physical.  He did suffer a shoulder dislocation in June while in [place]  playing pickle ball resulting in fracture, torn tendons including bicep and rotator cuff injury.  He did have a shoulder reset while in [place] under anesthesia due to severity.  He had to wait 8 weeks prior to any type of procedure to allow healing time for fracture.  He underwent surgical procedure on 8/29 by orthopedic surgeon Dr. Yvone.  He is currently working with PT but needs to wear sling at all times.  No further concerns at this time.  Update 10/08/2020 Dr. Rosemarie: William Underwood is a pleasant 74 year old Caucasian male seen today for initial office consultation visit for stroke.  History is obtained from the patient, review of electronic medical records and I personally reviewed imaging films in PACS.  Mr.  Underwood has past medical history of paroxysmal atrial fibrillation treated with cardiac ablation with complication of pulmonary vein stenosis treated with stenting in the [facility] clinic.  Also history of concussion, benign paroxysmal positional vertigo and benign prostatic hypertrophy.  He presented on 06/22/2020 with sudden onset of right-sided weakness and numbness which lasted about 20 minutes and resolved by the time he reached the ER.   While waiting there is developed recurrence of symptoms as well as double vision, unsteady gait and slurred speech and a code stroke was called.  He was given IV TPA uneventfully.  CT scan of the head was normal.  CT angiogram was negative for large vessel intracranial extracranial stenosis.  There is mild atherosclerotic disease at left carotid bifurcation without significant stenosis.  MRI scan of the brain showed numerous small cortical infarcts involving left frontal and parietal cortex as well as left thalamus and pons and midbrain in both cerebellar hemispheres the pattern and distribution consistent with central cardiac source of embolism.  2D echo showed normal ejection fraction of 60 to 65% without cardiac source of embolism.  LDL cholesterol was elevated 108 mg percent and hemoglobin A1c at 5.5.  Patient had previously been on Xarelto  for a year following his ablation but his cardiologist gave in clinic change him to aspirin since he had no recurrence of A. fib.  Patient was restarted on Eliquis  during this admission for stroke and seems to be tolerating well without bruising or bleeding.  He states his speech has recovered completely and right-sided weakness and strength is also improved.  He still however has persistent double vision and some gait imbalance.  He is currently getting outpatient physical therapy.  He has seen an ophthalmologist who prescribed a prism  which she states helped him only for few days and he is no longer finding it to be beneficial.  He has not been referred to outpatient occupational therapy and I recently referred him for speech therapy.  Patient tells me that he is actually had TIAs in early part of some of this year.  He had a transient episode of right upper extremity weakness and numbness which lasted about 20 minutes this time did not seek medical help.  He had a previous TIA in summer 2020 as well when on 04/17/2019 while riding his bike after having written for 36 miles  he noticed that he could not steer his bike straight and felt that he was leaning to one side.  He stopped and symptoms resolved after a while. He had an MRI scan of the brain at that time which showed no acute abnormalities.  He was asked to follow-up as outpatient with neurologist but this did not happen.  Is tolerating Lipitor  80 mg well without muscle aches and pains.  He has started walking but not started biking yet.   Initial visit 07/04/2020 Dr. Rosemarie: William Underwood is a pleasant 74 year old Caucasian male seen today for initial office follow-up visit following hospital consultation for stroke in August 2021.  History is obtained from the patient, review of electronic medical records and I personally reviewed imaging films in PACS.SEBASTHIAN STAILEY is an 74 y.o. male with a PMHx of BPPV, paroxysmal atrial fibrillation s/p ablation and pulmonary vein stenosis who presented to the ED early this afternoon of 06/22/2020 with a c/c of sudden onset numbness on his right side while riding his indoor bike. He did report a history of TIA. After he arrived, he stated that his symptoms seemed to be  resolving. He had no drift but his grip was weaker on the right. His speech was clear and he was alert and fully oriented. His RN checked on him at 2 PM and he was normal. Subsequently, the EDP went to place an IV and at that time the patient was exhibiting slurred speech, had double vision, and was unsteady on his feet. A Code Stroke was called and he was brought emergently to CT. CT revealed no acute hemorrhage.  He was not a TPA candidate since he was taking Eliquis .  CTA of brain and neck vessels showed no large vessel stenosis or occlusion.  MRI scan of the brain shows numerous areas of acute infarction in various vascular distributions majority of them in the posterior circulation particularly in the cerebellum and also involving the pons and midbrain in the frontal and parietal cortex and left thalamus.  2D echo showed  ejection fraction 6065% without cardiac source of embolism.  LDL cholesterol is elevated 108 mg percent and hemoglobin A1c was 5.5.  Urine drug screen was negative.  Patient was on aspirin no switch to Eliquis ..  Patient states he is doing well.  Is finished physical and occupational therapy and reached all his goals.  He can walk independently.  His diplopia is essentially gone and weakness is also much better.  Balance is mostly improved and he started riding his bike again.  Is tolerating Eliquis  well without bleeding and only minor bruising.  He is taking his Lipitor  regularly but does complain of some joint aches and pains.  His blood pressures well controlled and today it is 131/78.  He has no new complaints.  He does have chronic right knee pain and has been diagnosed with meniscal tear by orthopedist in plans to undergo surgery and is asking for neurological clearance.  The knee pain does bother him significantly and he plans to have surgery done in January.  He started driving again without any problems.      ROS:   14 system review of systems is positive for those listed in HPI all other systems negative  PMH:  Past Medical History:  Diagnosis Date   Benign prostatic hyperplasia without urinary obstruction 06/17/2014   BPPV (benign paroxysmal positional vertigo) 07/01/2011   Chronic hyperkalemia 05/20/2012   Overview:  Hx of for years   Concussion syndrome 05/15/2011   This seems like a moderately severe concussion with at least 1 minute loss of consciousness in 15-20 minutes of significant confusion including being unable to state his name    DEGENERATIVE JOINT DISEASE, HANDS 10/16/2009   Qualifier: Diagnosis of  By: HARVEY MD, [name]     Diverticulosis 06/20/2017   Hemorrhoids, external 06/20/2017   Lumbar spondylosis 06/20/2017   Fairly significant L4-L5 facet arthropathy with effusion.  Can consider facet injections and potentially RFA down the road.   NEUROPATHY 03/01/2008    Qualifier: Diagnosis of  By: HARVEY MD, [name]     Paroxysmal atrial fibrillation (HCC)    Pulmonary insufficiency 03/09/2018   moderate PI noted on echo 03/09/18   Pulmonary vein stenosis (HCC)    left inferior pulmonary vein stenosis   Right inguinal hernia 06/17/2014   Sinus bradycardia    Strain of gluteus medius 01/29/2017   Stroke (HCC) 05/2020   Vertigo     Social History:  Social History   Socioeconomic History   Marital status: Legally Separated    Spouse name: Not on file   Number of children: Not on file  Years of education: Not on file   Highest education level: Not on file  Occupational History   Occupation: retired  Tobacco Use   Smoking status: Never   Smokeless tobacco: Never  Vaping Use   Vaping status: Never Used  Substance and Sexual Activity   Alcohol use: Yes    Comment: Occasionally   Drug use: No   Sexual activity: Not Currently  Other Topics Concern   Not on file  Social History Narrative   Lives alone   Right Handed   Drinks 3-4 cups caffeine daily   Social Drivers of Health   Tobacco Use: Low Risk (11/17/2024)   Patient History    Smoking Tobacco Use: Never    Smokeless Tobacco Use: Never    Passive Exposure: Not on file  Financial Resource Strain: Low Risk (07/11/2024)   Overall Financial Resource Strain (CARDIA)    Difficulty of Paying Living Expenses: Not hard at all  Food Insecurity: No Food Insecurity (07/11/2024)   Epic    Worried About Programme Researcher, Broadcasting/film/video in the Last Year: Never true    Ran Out of Food in the Last Year: Never true  Transportation Needs: No Transportation Needs (07/11/2024)   Epic    Lack of Transportation (Medical): No    Lack of Transportation (Non-Medical): No  Physical Activity: Sufficiently Active (07/11/2024)   Exercise Vital Sign    Days of Exercise per Week: 6 days    Minutes of Exercise per Session: 150+ min  Stress: No Stress Concern Present (07/11/2024)   [employer] of Occupational Health -  Occupational Stress Questionnaire    Feeling of Stress: Not at all  Social Connections: Moderately Isolated (07/11/2024)   Social Connection and Isolation Panel    Frequency of Communication with Friends and Family: More than three times a week    Frequency of Social Gatherings with Friends and Family: More than three times a week    Attends Religious Services: 1 to 4 times per year    Active Member of [employer] or Organizations: No    Attends Banker Meetings: Never    Marital Status: Separated  Intimate Partner Violence: Not At Risk (07/11/2024)   Epic    Fear of Current or Ex-Partner: No    Emotionally Abused: No    Physically Abused: No    Sexually Abused: No  Depression (PHQ2-9): Low Risk (09/12/2024)   Depression (PHQ2-9)    PHQ-2 Score: 0  Alcohol Screen: Low Risk (07/11/2024)   Alcohol Screen    Last Alcohol Screening Score (AUDIT): 1  Housing: Unknown (07/11/2024)   Epic    Unable to Pay for Housing in the Last Year: No    Number of Times Moved in the Last Year: Not on file    Homeless in the Last Year: No  Utilities: Not At Risk (07/11/2024)   Epic    Threatened with loss of utilities: No  Health Literacy: Adequate Health Literacy (07/11/2024)   B1300 Health Literacy    Frequency of need for help with medical instructions: Never    Medications:   Current Outpatient Medications on File Prior to Visit  Medication Sig Dispense Refill   apixaban  (ELIQUIS ) 5 MG TABS tablet Take 1 tablet (5 mg total) by mouth 2 (two) times daily. 180 tablet 1   atorvastatin  (LIPITOR ) 80 MG tablet TAKE 1 TABLET BY MOUTH EVERY DAY 90 tablet 3   Coenzyme Q10 100 MG capsule Take 100 mg by mouth daily.  diazepam  (VALIUM ) 5 MG tablet Take 1 tablet (5 mg total) by mouth daily as needed for anxiety. 30 tablet 1   Multiple Vitamins-Minerals (MULTIVITAMIN WITH MINERALS) tablet Take 1 tablet by mouth daily.      tamsulosin  (FLOMAX ) 0.4 MG CAPS capsule TAKE 1 CAPSULE BY MOUTH EVERY DAY 90  capsule 1   traMADol  (ULTRAM ) 50 MG tablet Take 1 tablet (50 mg total) by mouth every 6 (six) hours as needed for moderate pain (pain score 4-6). 30 tablet 5   No current facility-administered medications on file prior to visit.    Allergies:   Allergies  Allergen Reactions   Cefdinir Palpitations and Rash   Codeine Other (See Comments)    Sensitive to this = agitation    Physical Exam Today's Vitals   11/17/24 1317  BP: 123/76  Pulse: (!) 59  Weight: 167 lb 12.8 oz (76.1 kg)  Height: 5' 7 (1.702 m)    Body mass index is 26.28 kg/m.   General: well developed, well nourished very pleasant middle-aged Caucasian male, seated, in no evident distress Head: head normocephalic and atraumatic.   Neck: supple with no carotid or supraclavicular bruits Cardiovascular: regular rate and rhythm, no murmurs Musculoskeletal: left arm in sling s/p shoulder surgical repair (see HPI) Skin:  no rash/petichiae Vascular:  Normal pulses all extremities  Neurologic Exam Mental Status: Awake and fully alert.  Fluent speech and language.  Oriented to place and time. Recent and remote memory intact. Attention span, concentration and fund of knowledge appropriate. Mood and affect appropriate.  Cranial Nerves: Pupils equal, briskly reactive to light. Extraocular movements normal without nystagmus or abnormal movements. Visual fields full to confrontation. Hearing intact. Facial sensation intact. Face, tongue, palate moves normally and symmetrically.  Motor: Normal bulk and tone. Normal strength in all tested extremity muscles Sensory.: intact to touch , pinprick , position and vibratory sensation.  Coordination: Rapid alternating movements normal in all extremities. Finger-to-nose and heel-to-shin performed accurately bilaterally Gait and Station: Arises from chair without difficulty. Stance is normal. Gait demonstrates normal stride length and balance although mild favoring of RLE d/t recent knee  replacement surgery without use of AD.  Reflexes: 1+ and symmetric. Toes downgoing.       ASSESSMENT/PLAN: 74 year old Caucasian male with multiple embolic strokes in August 2021 likely related to history of atrial fibrillation.  Vascular risk factors of hyperlipidemia, history of A. fib s/p cardioversion and advanced age.  He is doing well with no recurrent neurovascular symptoms.    I had a long d/w patient about his remote stroke, atrial fibrillation,risk for recurrent stroke/TIAs, personally independently reviewed imaging studies and stroke evaluation results and answered questions.Continue Eliquis  5 mg twice daily for secondary stroke prevention and maintain strict control of hypertension with blood pressure goal below 130/90, diabetes with hemoglobin A1c goal below 6.5% and lipids with LDL cholesterol goal below 70 mg/dL. I also advised the patient to eat a healthy diet with plenty of whole grains, cereals, fruits and vegetables, exercise regularly and maintain ideal body weight patient had questions about doing the Watchman device but I feel he does not meet the current FDA approved indication tolerating Eliquis  well and has not had any bleeding complications.  Furthermore there are ongoing trials comparing Eliquis  to much safer anticoagulants like factor XI inhibitors which may be an safer option in the future.  Followup in the future with me in future only as needed.     I personally spent a total of 35 minutes  in the care of the patient today including getting/reviewing separately obtained history, performing a medically appropriate exam/evaluation, counseling and educating, placing orders, referring and communicating with other health care professionals, documenting clinical information in the EHR, independently interpreting results, and coordinating care.           Eather Popp, MD  [facility] [address] [place], [place] [address]  Phone  [phone] Fax [phone] Note: This document was prepared with digital dictation and possible smart phrase technology. Any transcriptional errors that result from this process are unintentional.   "

## 2024-11-17 NOTE — Patient Instructions (Signed)
 I had a long d/w patient about his remote stroke, atrial fibrillation,risk for recurrent stroke/TIAs, personally independently reviewed imaging studies and stroke evaluation results and answered questions.Continue Eliquis  5 mg twice daily for secondary stroke prevention and maintain strict control of hypertension with blood pressure goal below 130/90, diabetes with hemoglobin A1c goal below 6.5% and lipids with LDL cholesterol goal below 70 mg/dL. I also advised the patient to eat a healthy diet with plenty of whole grains, cereals, fruits and vegetables, exercise regularly and maintain ideal body weight patient had questions about doing the Watchman device but I feel he does not meet the current FDA approved indication tolerating Eliquis  well and has not had any bleeding complications.  Furthermore there are ongoing trials comparing Eliquis  to much safer anticoagulants like factor XI inhibitors which may be an safer option in the future followup in the future with me in future only as needed.

## 2024-11-24 ENCOUNTER — Ambulatory Visit: Admitting: Cardiology

## 2024-11-25 ENCOUNTER — Ambulatory Visit: Admitting: Cardiology

## 2024-11-25 ENCOUNTER — Encounter: Payer: Self-pay | Admitting: Internal Medicine

## 2024-11-25 ENCOUNTER — Encounter: Payer: Self-pay | Admitting: Cardiology

## 2025-09-14 ENCOUNTER — Encounter: Admitting: Family Medicine
